# Patient Record
Sex: Female | Born: 1955 | Race: Black or African American | Hispanic: No | Marital: Single | Smoking: Current every day smoker
Health system: Southern US, Community
[De-identification: ages and names within clinical notes are randomized; demographics above are authoritative.]

## PROBLEM LIST (undated history)

## (undated) DIAGNOSIS — E119 Type 2 diabetes mellitus without complications: Secondary | ICD-10-CM

## (undated) DIAGNOSIS — Z8701 Personal history of pneumonia (recurrent): Secondary | ICD-10-CM

## (undated) DIAGNOSIS — R519 Headache, unspecified: Secondary | ICD-10-CM

## (undated) DIAGNOSIS — I10 Essential (primary) hypertension: Secondary | ICD-10-CM

## (undated) DIAGNOSIS — K219 Gastro-esophageal reflux disease without esophagitis: Secondary | ICD-10-CM

## (undated) DIAGNOSIS — I679 Cerebrovascular disease, unspecified: Secondary | ICD-10-CM

## (undated) DIAGNOSIS — M1990 Unspecified osteoarthritis, unspecified site: Secondary | ICD-10-CM

## (undated) DIAGNOSIS — I639 Cerebral infarction, unspecified: Secondary | ICD-10-CM

## (undated) DIAGNOSIS — F32A Depression, unspecified: Secondary | ICD-10-CM

## (undated) HISTORY — DX: Type 2 diabetes mellitus without complications: E11.9

## (undated) HISTORY — PX: OTHER SURGICAL HISTORY: SHX169

## (undated) HISTORY — DX: Cerebrovascular disease, unspecified: I67.9

## (undated) HISTORY — PX: SHOULDER ARTHROSCOPY: SHX128

## (undated) HISTORY — PX: KNEE ARTHROSCOPY: SUR90

## (undated) HISTORY — DX: Essential (primary) hypertension: I10

## (undated) HISTORY — PX: CATARACT EXTRACTION, BILATERAL: SHX1313

## (undated) HISTORY — PX: THYROIDECTOMY: SHX17

## (undated) HISTORY — PX: FRACTURE SURGERY: SHX138

---

## 2008-06-25 DIAGNOSIS — I639 Cerebral infarction, unspecified: Secondary | ICD-10-CM

## 2008-06-25 HISTORY — DX: Cerebral infarction, unspecified: I63.9

## 2018-12-29 ENCOUNTER — Ambulatory Visit (INDEPENDENT_AMBULATORY_CARE_PROVIDER_SITE_OTHER): Payer: Medicaid Other | Admitting: Family Medicine

## 2018-12-29 ENCOUNTER — Other Ambulatory Visit: Payer: Self-pay

## 2018-12-29 ENCOUNTER — Encounter: Payer: Self-pay | Admitting: Family Medicine

## 2018-12-29 VITALS — BP 124/70 | HR 81

## 2018-12-29 DIAGNOSIS — E1159 Type 2 diabetes mellitus with other circulatory complications: Secondary | ICD-10-CM

## 2018-12-29 DIAGNOSIS — I152 Hypertension secondary to endocrine disorders: Secondary | ICD-10-CM

## 2018-12-29 DIAGNOSIS — Z8673 Personal history of transient ischemic attack (TIA), and cerebral infarction without residual deficits: Secondary | ICD-10-CM

## 2018-12-29 DIAGNOSIS — R7303 Prediabetes: Secondary | ICD-10-CM

## 2018-12-29 DIAGNOSIS — I10 Essential (primary) hypertension: Secondary | ICD-10-CM | POA: Diagnosis not present

## 2018-12-29 DIAGNOSIS — Z9889 Other specified postprocedural states: Secondary | ICD-10-CM

## 2018-12-29 DIAGNOSIS — R232 Flushing: Secondary | ICD-10-CM

## 2018-12-29 DIAGNOSIS — R7309 Other abnormal glucose: Secondary | ICD-10-CM

## 2018-12-29 DIAGNOSIS — L608 Other nail disorders: Secondary | ICD-10-CM

## 2018-12-29 DIAGNOSIS — B351 Tinea unguium: Secondary | ICD-10-CM

## 2018-12-29 LAB — POCT GLYCOSYLATED HEMOGLOBIN (HGB A1C): HbA1c, POC (controlled diabetic range): 5.8 % (ref 0.0–7.0)

## 2018-12-29 NOTE — Patient Instructions (Signed)
It was great meeting you today!  We went over a number of issues.  I will get some lab work to check your cholesterol panel, kidney and liver function, and thyroid.  I will call you with these results will talk about them.  I made a referral to podiatry for foot care.

## 2018-12-30 LAB — LIPID PANEL
Chol/HDL Ratio: 3.3 ratio (ref 0.0–4.4)
Cholesterol, Total: 160 mg/dL (ref 100–199)
HDL: 49 mg/dL (ref >39)
Triglycerides: 484 mg/dL — ABNORMAL HIGH (ref 0–149)

## 2018-12-30 LAB — COMPREHENSIVE METABOLIC PANEL
ALT: 30 IU/L (ref 0–32)
AST: 18 IU/L (ref 0–40)
Albumin/Globulin Ratio: 1.7 (ref 1.2–2.2)
Albumin: 4.1 g/dL (ref 3.8–4.8)
Alkaline Phosphatase: 67 IU/L (ref 39–117)
BUN/Creatinine Ratio: 11 — ABNORMAL LOW (ref 12–28)
BUN: 10 mg/dL (ref 8–27)
Bilirubin Total: 0.4 mg/dL (ref 0.0–1.2)
CO2: 24 mmol/L (ref 20–29)
Calcium: 9.7 mg/dL (ref 8.7–10.3)
Chloride: 104 mmol/L (ref 96–106)
Creatinine, Ser: 0.89 mg/dL (ref 0.57–1.00)
GFR calc Af Amer: 81 mL/min/{1.73_m2} (ref >59)
GFR calc non Af Amer: 70 mL/min/{1.73_m2} (ref >59)
Globulin, Total: 2.4 g/dL (ref 1.5–4.5)
Glucose: 95 mg/dL (ref 65–99)
Potassium: 4.7 mmol/L (ref 3.5–5.2)
Sodium: 143 mmol/L (ref 134–144)
Total Protein: 6.5 g/dL (ref 6.0–8.5)

## 2018-12-30 LAB — TSH: TSH: 0.594 u[IU]/mL (ref 0.450–4.500)

## 2018-12-31 ENCOUNTER — Telehealth: Payer: Self-pay | Admitting: *Deleted

## 2018-12-31 NOTE — Telephone Encounter (Signed)
I called pt and asked if I could help. Pt states she was calling to see if we took new patients and accepted medicaid. I told pt we were accepting new patients and medicaid and I transferred to schedulers.

## 2018-12-31 NOTE — Telephone Encounter (Signed)
Pt called left only name and phone number.

## 2019-01-01 ENCOUNTER — Telehealth: Payer: Self-pay | Admitting: Family Medicine

## 2019-01-01 ENCOUNTER — Encounter: Payer: Self-pay | Admitting: Family Medicine

## 2019-01-01 ENCOUNTER — Telehealth: Payer: Self-pay

## 2019-01-01 DIAGNOSIS — Z8673 Personal history of transient ischemic attack (TIA), and cerebral infarction without residual deficits: Secondary | ICD-10-CM | POA: Insufficient documentation

## 2019-01-01 DIAGNOSIS — R7303 Prediabetes: Secondary | ICD-10-CM | POA: Insufficient documentation

## 2019-01-01 DIAGNOSIS — E1159 Type 2 diabetes mellitus with other circulatory complications: Secondary | ICD-10-CM | POA: Insufficient documentation

## 2019-01-01 DIAGNOSIS — I152 Hypertension secondary to endocrine disorders: Secondary | ICD-10-CM | POA: Insufficient documentation

## 2019-01-01 DIAGNOSIS — B351 Tinea unguium: Secondary | ICD-10-CM | POA: Insufficient documentation

## 2019-01-01 DIAGNOSIS — R232 Flushing: Secondary | ICD-10-CM | POA: Insufficient documentation

## 2019-01-01 MED ORDER — ATORVASTATIN CALCIUM 20 MG PO TABS: 20.0000 mg | ORAL_TABLET | Freq: Every day | ORAL | 0 refills | Status: DC

## 2019-01-01 MED ORDER — PROMETHAZINE HCL 12.5 MG PO TABS: 12.5000 mg | ORAL_TABLET | Freq: Three times a day (TID) | ORAL | 0 refills | Status: DC | PRN

## 2019-01-01 NOTE — Assessment & Plan Note (Signed)
Likely secondary to menopause given the onset and description of the symptoms.

## 2019-01-01 NOTE — Assessment & Plan Note (Signed)
BP 124/70.  Well-controlled on losartan 50 mg.

## 2019-01-01 NOTE — Assessment & Plan Note (Signed)
A1c 5.7.  Well-controlled metformin XR 500 mg daily.  We will continue with this dose, recheck A1c in 3 months.

## 2019-01-01 NOTE — Telephone Encounter (Signed)
Discussed lab results with patient. Will start atorvastatin 20mg  daily as patient is s/p stroke. Asked for me to refill phenergan, sent in to pharmacy.  Guadalupe Dawn MD PGY-2 Family Medicine Resident

## 2019-01-01 NOTE — Telephone Encounter (Signed)
Sounds great, I will keep an eye out for it  Monica Dawn MD PGY-2 Family Medicine Resident

## 2019-01-01 NOTE — Assessment & Plan Note (Signed)
Patient's risk factors appear well-controlled at this point.  Will get lipid panel to evaluate cholesterol.  Regardless of result will need to be on statin therapy, the only determination will be the dose.  Apparently had a GI bleed while on aspirin.  Unclear if she has any follow-up with neurology scheduled.

## 2019-01-01 NOTE — Telephone Encounter (Signed)
Please let the patient know that her labwork looked good. Her hemoglobin a1c was

## 2019-01-01 NOTE — Telephone Encounter (Signed)
Pt called back to let Dr. Kris Mouton know he will be receiving a packet from her previous Dr. Ottis Stain, CMA

## 2019-01-01 NOTE — Assessment & Plan Note (Signed)
Toenail overgrowth likely secondary to onychomycosis.  Patient request podiatry referral for management.  I think this is reasonable, referral placed.

## 2019-01-01 NOTE — Progress Notes (Signed)
  HPI:  Patient presents today for a new patient appointment to establish general primary care, also to discuss chronic stroke management, hot flashes.  Unfortunately do not have all of patient's records, these have been requested.  Patient had a stroke with mild residual deficits, most notable in needing a cane for left lower extremity weakness.  Patient states that she only takes losartan, metformin.  Does not take aspirin and does not take any anticholesterol medication.  She states that she was on aspirin but this was stopped as she had a GI bleed, thought to be due to hemorrhoids.  She had a colonoscopy 2 years ago.  A1c reviewed and was 5.6.  Patient states that she had part of her thyroid taken out for a thyroid issue.  She does not take any supplementation medications and did not think she is been checked recently.  Patient states that she has had hot flashes "all of a sudden".  His they will come on and she will feel hot and sweaty and these last for between 30 minutes to an hour.   ROS: See HPI  Past Medical Hx:  -Stroke -Unknown thyroid issue -Type 2 diabetes -Hypertension -Hemorrhoids  Past Surgical Hx:  -None  Family Hx: updated in Epic  Social Hx: Lives at home alone, retired, does not smoke, does not drink any alcohol, no illicit substances  Health Maintenance:  -Unknown, will attempt to get old PCP records  PHYSICAL EXAM: BP 124/70   Pulse 81   SpO2 95%  Gen: Well-appearing 63 year old African-American female, no acute stress, resting comfortably HEENT: EOMI, PERRLA. Heart: Regular rate rhythm, no M/R/G.  Palpable peripheral pulses, skin warm and dry Lungs: Lungs clear to auscultation bilaterally, no accessory muscle use Abdomen: Soft, nontender, nondistended Neuro: CN II through XII intact, very very mild gait abnormality, no focal neurologic deficit, 5 of 5 strength all muscle groups bilateral upper extremity bilateral lower extremity  ASSESSMENT/PLAN:  #  Health maintenance:  -We will attempt to get records from outside PCP  History of stroke Patient's risk factors appear well-controlled at this point.  Will get lipid panel to evaluate cholesterol.  Regardless of result will need to be on statin therapy, the only determination will be the dose.  Apparently had a GI bleed while on aspirin.  Unclear if she has any follow-up with neurology scheduled.  Hypertension associated with diabetes ([place]) BP 124/70.  Well-controlled on losartan 50 mg.  Hot flashes Likely secondary to menopause given the onset and description of the symptoms.  Pre-diabetes A1c 5.7.  Well-controlled metformin XR 500 mg daily.  We will continue with this dose, recheck A1c in 3 months.  Onychomycosis Toenail overgrowth likely secondary to onychomycosis.  Patient request podiatry referral for management.  I think this is reasonable, referral placed.     FOLLOW UP: Follow-up in 3 months for A1c check  Guadalupe Dawn MD PGY-3 Family Medicine Resident

## 2019-01-09 ENCOUNTER — Telehealth: Payer: Self-pay | Admitting: *Deleted

## 2019-01-09 ENCOUNTER — Encounter: Payer: Self-pay | Admitting: Podiatry

## 2019-01-09 ENCOUNTER — Ambulatory Visit: Payer: Medicaid Other | Admitting: Podiatry

## 2019-01-09 ENCOUNTER — Other Ambulatory Visit: Payer: Self-pay

## 2019-01-09 VITALS — BP 134/92 | HR 89 | Temp 97.3°F

## 2019-01-09 DIAGNOSIS — M79675 Pain in left toe(s): Secondary | ICD-10-CM

## 2019-01-09 DIAGNOSIS — M79674 Pain in right toe(s): Secondary | ICD-10-CM | POA: Diagnosis not present

## 2019-01-09 DIAGNOSIS — B351 Tinea unguium: Secondary | ICD-10-CM

## 2019-01-09 DIAGNOSIS — M2011 Hallux valgus (acquired), right foot: Secondary | ICD-10-CM

## 2019-01-09 DIAGNOSIS — M2012 Hallux valgus (acquired), left foot: Secondary | ICD-10-CM

## 2019-01-09 MED ORDER — CICLOPIROX 8 % EX SOLN: Freq: Every day | CUTANEOUS | 11 refills | Status: DC

## 2019-01-09 NOTE — Patient Instructions (Signed)
Diabetes Mellitus and Foot Care Foot care is an important part of your health, especially when you have diabetes. Diabetes may cause you to have problems because of poor blood flow (circulation) to your feet and legs, which can cause your skin to:  Become thinner and drier.  Break more easily.  Heal more slowly.  Peel and crack. You may also have nerve damage (neuropathy) in your legs and feet, causing decreased feeling in them. This means that you may not notice minor injuries to your feet that could lead to more serious problems. Noticing and addressing any potential problems early is the best way to prevent future foot problems. How to care for your feet Foot hygiene  Wash your feet daily with warm water and mild soap. Do not use hot water. Then, pat your feet and the areas between your toes until they are completely dry. Do not soak your feet as this can dry your skin.  Trim your toenails straight across. Do not dig under them or around the cuticle. File the edges of your nails with an emery board or nail file.  Apply a moisturizing lotion or petroleum jelly to the skin on your feet and to dry, brittle toenails. Use lotion that does not contain alcohol and is unscented. Do not apply lotion between your toes. Shoes and socks  Wear clean socks or stockings every day. Make sure they are not too tight. Do not wear knee-high stockings since they may decrease blood flow to your legs.  Wear shoes that fit properly and have enough cushioning. Always look in your shoes before you put them on to be sure there are no objects inside.  To break in new shoes, wear them for just a few hours a day. This prevents injuries on your feet. Wounds, scrapes, corns, and calluses  Check your feet daily for blisters, cuts, bruises, sores, and redness. If you cannot see the bottom of your feet, use a mirror or ask someone for help.  Do not cut corns or calluses or try to remove them with medicine.  If you  find a minor scrape, cut, or break in the skin on your feet, keep it and the skin around it clean and dry. You may clean these areas with mild soap and water. Do not clean the area with peroxide, alcohol, or iodine.  If you have a wound, scrape, corn, or callus on your foot, look at it several times a day to make sure it is healing and not infected. Check for: ? Redness, swelling, or pain. ? Fluid or blood. ? Warmth. ? Pus or a bad smell. General instructions  Do not cross your legs. This may decrease blood flow to your feet.  Do not use heating pads or hot water bottles on your feet. They may burn your skin. If you have lost feeling in your feet or legs, you may not know this is happening until it is too late.  Protect your feet from hot and cold by wearing shoes, such as at the beach or on hot pavement.  Schedule a complete foot exam at least once a year (annually) or more often if you have foot problems. If you have foot problems, report any cuts, sores, or bruises to your health care provider immediately. Contact a health care provider if:  You have a medical condition that increases your risk of infection and you have any cuts, sores, or bruises on your feet.  You have an injury that is not   healing.  You have redness on your legs or feet.  You feel burning or tingling in your legs or feet.  You have pain or cramps in your legs and feet.  Your legs or feet are numb.  Your feet always feel cold.  You have pain around a toenail. Get help right away if:  You have a wound, scrape, corn, or callus on your foot and: ? You have pain, swelling, or redness that gets worse. ? You have fluid or blood coming from the wound, scrape, corn, or callus. ? Your wound, scrape, corn, or callus feels warm to the touch. ? You have pus or a bad smell coming from the wound, scrape, corn, or callus. ? You have a fever. ? You have a red line going up your leg. Summary  Check your feet every day  for cuts, sores, red spots, swelling, and blisters.  Moisturize feet and legs daily.  Wear shoes that fit properly and have enough cushioning.  If you have foot problems, report any cuts, sores, or bruises to your health care provider immediately.  Schedule a complete foot exam at least once a year (annually) or more often if you have foot problems. This information is not intended to replace advice given to you by your health care provider. Make sure you discuss any questions you have with your health care provider. Document Released: 06/08/2000 Document Revised: 07/24/2017 Document Reviewed: 07/13/2016 Elsevier Patient Education  2020 Elsevier Inc.   Onychomycosis/Fungal Toenails  WHAT IS IT? An infection that lies within the keratin of your nail plate that is caused by a fungus.  WHY ME? Fungal infections affect all ages, sexes, races, and creeds.  There may be many factors that predispose you to a fungal infection such as age, coexisting medical conditions such as diabetes, or an autoimmune disease; stress, medications, fatigue, genetics, etc.  Bottom line: fungus thrives in a warm, moist environment and your shoes offer such a location.  IS IT CONTAGIOUS? Theoretically, yes.  You do not want to share shoes, nail clippers or files with someone who has fungal toenails.  Walking around barefoot in the same room or sleeping in the same bed is unlikely to transfer the organism.  It is important to realize, however, that fungus can spread easily from one nail to the next on the same foot.  HOW DO WE TREAT THIS?  There are several ways to treat this condition.  Treatment may depend on many factors such as age, medications, pregnancy, liver and kidney conditions, etc.  It is best to ask your doctor which options are available to you.  1. No treatment.   Unlike many other medical concerns, you can live with this condition.  However for many people this can be a painful condition and may lead to  ingrown toenails or a bacterial infection.  It is recommended that you keep the nails cut short to help reduce the amount of fungal nail. 2. Topical treatment.  These range from herbal remedies to prescription strength nail lacquers.  About 40-50% effective, topicals require twice daily application for approximately 9 to 12 months or until an entirely new nail has grown out.  The most effective topicals are medical grade medications available through physicians offices. 3. Oral antifungal medications.  With an 80-90% cure rate, the most common oral medication requires 3 to 4 months of therapy and stays in your system for a year as the new nail grows out.  Oral antifungal medications do require   blood work to make sure it is a safe drug for you.  A liver function panel will be performed prior to starting the medication and after the first month of treatment.  It is important to have the blood work performed to avoid any harmful side effects.  In general, this medication safe but blood work is required. 4. Laser Therapy.  This treatment is performed by applying a specialized laser to the affected nail plate.  This therapy is noninvasive, fast, and non-painful.  It is not covered by insurance and is therefore, out of pocket.  The results have been very good with a 80-95% cure rate.  [facility] is the only practice in the area to offer this therapy. 5. Permanent Nail Avulsion.  Removing the entire nail so that a new nail will not grow back. 

## 2019-01-09 NOTE — Telephone Encounter (Signed)
Pt called to see what time her appt is today. I informed pt the appt is at 1:45pm.

## 2019-01-14 ENCOUNTER — Encounter: Payer: Self-pay | Admitting: Podiatry

## 2019-01-14 NOTE — Progress Notes (Signed)
Subjective: Monica Watson presents today referred by Guadalupe Dawn, MD with cc of painful, discolored, thick toenails which interfere with daily activities.  Pain is aggravated when wearing enclosed shoe gear.   Past Medical History:  Diagnosis Date  . Cerebrovascular disease   . Hypertension   . Type 2 diabetes mellitus [facility])      Patient Active Problem List   Diagnosis Date Noted  . History of stroke 01/01/2019  . Hypertension associated with diabetes ([place]) 01/01/2019  . Pre-diabetes 01/01/2019  . Hot flashes 01/01/2019  . Onychomycosis 01/01/2019     Past Surgical History:  Procedure Laterality Date  . THYROIDECTOMY      Medications reviewed.  No Known Allergies   Social History   Occupational History  . Not on file  Tobacco Use  . Smoking status: Former Research scientist (life sciences)  . Smokeless tobacco: Never Used  Substance and Sexual Activity  . Alcohol use: Never    Frequency: Never  . Drug use: Never  . Sexual activity: Not on file    History reviewed. No pertinent family history.    There is no immunization history on file for this patient.   Review of systems: Positive Findings in bold print.  Constitutional:  chills, fatigue, fever, sweats, weight change Communication: Optometrist, sign Ecologist, hand writing, iPad/Android device Head: headaches, head injury Eyes: changes in vision, eye pain, glaucoma, cataracts, macular degeneration, diplopia, glare,  light sensitivity, eyeglasses or contacts, blindness Ears nose mouth throat: hearing impaired, hearing aids,  ringing in ears, deaf, sign language,  vertigo,   nosebleeds,  rhinitis,  cold sores, snoring, swollen glands Cardiovascular: HTN, edema, arrhythmia, pacemaker in place, defibrillator in place, chest pain/tightness, chronic anticoagulation, blood clot, heart failure, MI Peripheral Vascular: leg cramps, varicose veins, blood clots, lymphedema, varicosities Respiratory:  difficulty breathing, denies  congestion, SOB, wheezing, cough, emphysema Gastrointestinal: change in appetite or weight, abdominal pain, constipation, diarrhea, nausea, vomiting, vomiting blood, change in bowel habits, abdominal pain, jaundice, rectal bleeding, hemorrhoids, GERD Genitourinary:  nocturia,  pain on urination, polyuria,  blood in urine, Foley catheter, urinary urgency, ESRD on hemodialysis Musculoskeletal: amputation, cramping, stiff joints, painful joints, decreased joint motion, fractures, OA, gout, hemiplegia, paraplegia, uses cane, wheelchair bound, uses walker, uses rollator Skin: +changes in toenails, color change, dryness, itching, mole changes,  rash, wound(s) Neurological: headaches, numbness in feet, paresthesias in feet, burning in feet, fainting,  seizures, change in speech. denies headaches, memory problems/poor historian, cerebral palsy, weakness, paralysis, CVA, TIA Endocrine: pre-diabetes, diabetes, hypothyroidism, hyperthyroidism,  goiter, dry mouth, flushing, heat intolerance,  cold intolerance,  excessive thirst, denies polyuria,  nocturia Hematological:  easy bleeding, excessive bleeding, easy bruising, enlarged lymph nodes, on long term blood thinner, history of past transusions Allergy/immunological:  hives, eczema, frequent infections, multiple drug allergies, seasonal allergies, transplant recipient, multiple food allergies Psychiatric:  anxiety, depression, mood disorder, suicidal ideations, hallucinations, insomnia  Objective: Vitals:   01/09/19 1352  BP: (!) 134/92  Pulse: 89  Temp: (!) 97.3 F (36.3 C)    Vascular Examination: Capillary refill time immediate x 10 digits.  Dorsalis pedis pulses palpable b/l.   Posterior tibial pulses palpable b/l.   No digital hair x 10 digits.  Skin temperature gradient WNL b/l.  Dermatological Examination: Skin with normal turgor, texture and tone b/l.  Toenails 1-5 b/l discolored, thick, dystrophic with subungual debris and pain with  palpation to nailbeds due to thickness of nails.  Musculoskeletal: Muscle strength 5/5 RLE and 4/5 to LLE.  HAV with bunion  b/l.  Hammertoe 2nd digit b/l.  Neurological: Sensation intact with 10 gram monofilament.  Vibratory sensation intact.  Assessment: 1. Painful onychomycosis toenails 1-5 b/l  2. Pain in toes of left foot and right foot 3. HAV with bunion b/l 4. Hammertoe 2nd b/l  Plan: 1. Discussed onychomycosis and treatment options.  Literature dispensed on today. 2. Toenails 1-5 b/l were debrided in length and girth without iatrogenic bleeding.Rx written for Penlac Nail Lacquer 8% to be applied to affected toenails once daily for 48 weeks and removed once weekly with nail polis remover. 3. Patient to continue soft, supportive shoe gear daily. 4. Patient to report any pedal injuries to medical professional immediately. 5. Follow up 3 months.  6. Patient/POA to call should there be a concern in the interim.

## 2019-01-16 ENCOUNTER — Telehealth: Payer: Self-pay | Admitting: *Deleted

## 2019-01-16 NOTE — Telephone Encounter (Signed)
Pt wants to know if MD received the paperwork from the housing authority last week.  There is nothing in providers box, will forward to him to ask.  Christen Bame, CMA

## 2019-01-19 NOTE — Telephone Encounter (Signed)
I dont believe I ever received anything like that for this patient. I dont think we even discussed during her appointment or subsequent telephone call. She can ask the housing authority to resend.  Guadalupe Dawn MD PGY-3 Family Medicine Resident

## 2019-01-20 NOTE — Telephone Encounter (Signed)
Pt informed.  Catlin Doria, CMA  

## 2019-01-28 ENCOUNTER — Telehealth: Payer: Self-pay | Admitting: *Deleted

## 2019-01-28 NOTE — Telephone Encounter (Signed)
Pt calls for 2 reasons:  1. She would like to have a PCA because she had one for 3 -4 hours at her last residence   ( this form would need to be completed and submitted to [facility] [place] - I can help with that)  2.She would like a referral to an eye MD. Christen Bame, CMA

## 2019-01-30 ENCOUNTER — Other Ambulatory Visit: Payer: Self-pay | Admitting: *Deleted

## 2019-01-30 MED ORDER — LOSARTAN POTASSIUM 50 MG PO TABS: ORAL_TABLET | ORAL | 0 refills | Status: DC

## 2019-02-25 ENCOUNTER — Ambulatory Visit: Payer: Medicaid Other | Admitting: Family Medicine

## 2019-03-04 ENCOUNTER — Ambulatory Visit: Payer: Medicaid Other | Admitting: Family Medicine

## 2019-03-04 ENCOUNTER — Ambulatory Visit (INDEPENDENT_AMBULATORY_CARE_PROVIDER_SITE_OTHER): Payer: Medicaid Other | Admitting: Family Medicine

## 2019-03-04 ENCOUNTER — Other Ambulatory Visit: Payer: Self-pay

## 2019-03-04 VITALS — BP 130/80 | HR 79 | Wt 203.4 lb

## 2019-03-04 DIAGNOSIS — R269 Unspecified abnormalities of gait and mobility: Secondary | ICD-10-CM | POA: Diagnosis present

## 2019-03-04 DIAGNOSIS — R0981 Nasal congestion: Secondary | ICD-10-CM

## 2019-03-04 DIAGNOSIS — Z8673 Personal history of transient ischemic attack (TIA), and cerebral infarction without residual deficits: Secondary | ICD-10-CM

## 2019-03-04 NOTE — Patient Instructions (Signed)
It was great seeing you again today!  I am sorry been having the ear fullness.  That is likely inner ear pressure caused by your sinuses.  You can take Afrin over-the-counter nasal spray for a few days to see if this clears it up.  Please let me know if it does not help.  You can just call Medicaid and asked them which optometrist and dentist take your insurance in the area.  I will place referral for home health.

## 2019-03-09 NOTE — Telephone Encounter (Signed)
Discussed with patient at most recent visit  Monica Dawn MD PGY-3 Family Medicine Resident

## 2019-03-11 ENCOUNTER — Encounter: Payer: Self-pay | Admitting: Family Medicine

## 2019-03-11 DIAGNOSIS — R0981 Nasal congestion: Secondary | ICD-10-CM | POA: Insufficient documentation

## 2019-03-11 NOTE — Assessment & Plan Note (Signed)
Patient with symptoms consistent with sinus congestion.  Explained she can take Afrin over-the-counter for a few days to see if this helps.  This does not help she can let me know happy to prescribe her Flonase.

## 2019-03-11 NOTE — Progress Notes (Signed)
   HPI 63 year old female who presents for pain in her ears.  She states this pain is been going on for about a week now.  She describes it as a "pressure".  She makes a motion her jaw extension says this relieves her pain.  She has not tried anything for it.  She is also had accompanying rhinorrhea and some sinus pressure.  She is requesting a referral to an optometrist and a dentist.  Explained to patient that is not in referral for either these she can contact her insurance company to figure out who takes her insurance and the area.  CC: sinus congestion   ROS:   Review of Systems See HPI for ROS.   CC, SH/smoking status, and VS noted  Objective: BP 130/80   Pulse 79   Wt 203 lb 6.4 oz (92.3 kg)   SpO2 26%  Gen: 63 year old, very pleasant, African-American female HEENT: No bulging tympanic membrane, no erythematous ear canal.  Very mild sinus tenderness CV: Regular rate rhythm, no M/R/G Resp: Lungs clear to auscultation bilaterally, no accessory muscle use Neuro: Alert oriented x3, speech clear, mild right upper extremity right lower extremity weakness is compared to left.  Accompnaying gait abnormality secondary to weakness.  Assessment and plan:  Sinus congestion Patient with symptoms consistent with sinus congestion.  Explained she can take Afrin over-the-counter for a few days to see if this helps.  This does not help she can let me know happy to prescribe her Flonase.   Orders Placed This Encounter  Procedures  . Ambulatory referral to Home Health    Referral Priority:   Routine    Referral Type:   Home Health Care    Referral Reason:   Specialty Services Required    Requested Specialty:   [place]    Number of Visits Requested:   1    No orders of the defined types were placed in this encounter.    Guadalupe Dawn MD PGY-3 Family Medicine Resident  03/11/2019 7:41 AM

## 2019-03-11 NOTE — Assessment & Plan Note (Signed)
patient requesting home health aide to help her with her ADLs secondary to stroke.

## 2019-03-23 ENCOUNTER — Ambulatory Visit: Payer: Medicaid Other | Admitting: Family Medicine

## 2019-03-26 ENCOUNTER — Ambulatory Visit: Payer: Medicaid Other | Admitting: Family Medicine

## 2019-04-02 ENCOUNTER — Ambulatory Visit: Payer: Medicaid Other | Admitting: Family Medicine

## 2019-04-03 ENCOUNTER — Other Ambulatory Visit: Payer: Self-pay

## 2019-04-03 ENCOUNTER — Ambulatory Visit (INDEPENDENT_AMBULATORY_CARE_PROVIDER_SITE_OTHER): Payer: Medicaid Other | Admitting: Family Medicine

## 2019-04-03 VITALS — BP 110/65 | HR 73 | Temp 98.4°F | Wt 199.0 lb

## 2019-04-03 DIAGNOSIS — K625 Hemorrhage of anus and rectum: Secondary | ICD-10-CM

## 2019-04-03 DIAGNOSIS — K648 Other hemorrhoids: Secondary | ICD-10-CM | POA: Diagnosis not present

## 2019-04-03 MED ORDER — IRON (FERROUS SULFATE) 325 (65 FE) MG PO TABS: 325.0000 mg | ORAL_TABLET | Freq: Every day | ORAL | 3 refills | Status: DC

## 2019-04-03 NOTE — Progress Notes (Signed)
   [facility] Phone: [phone]     Monica Watson - 63 y.o. female MRN [id number]  Date of birth: Oct 30, 1955  Subjective:   cc: bloody stools  HPI:  Bloody stools: started two years ago.  A doctor in [place] 'fixed it' with a balloon. This was about 1.5 years ago.  A couple after it was 'fixed' she had rectal bleeding again. Only sees blood when she wipes, does not see blood in the toilet bowl. No black, tarry stools. Now happens once or twice a week.  Not more frequent now than before.  Only has occasional rectal pain when sitting on the toilet.  She uses preparation H.  She received a colonoscopy a few years ago, which she says did not show cancer.Dr. Baltazar Apo in [place] was her previous pcp and should have records of her colonoscopy.   Only feels fatigue 'sometimes'. The 12 stairs in her apartment tire her out.  Feels dizziness 'sometimes' when she stands up.    ROS: See HPI for pertinent positives and negatives  Past Medical History significant for hemorrhoids.   Family history reviewed for today's visit. No changes.  Social history- patient is a smoker - 1-2 cigarettes a day. Smoking since 63 years old.     Objective:   BP 110/65   Pulse 73   Temp 98.4 F (36.9 C) (Oral)   Wt 199 lb (90.3 kg)   SpO2 97%  Gen: NAD, alert and oriented, cooperative with exam HEENT: No subconjunctival pallor. Moist oral mucosa.  CV: normal rate, regular rhythm. No murmurs, no rubs.  Resp: LCTAB, no wheezes, crackles. normal work of breathing GI: nontender to palpation, BS present, no guarding or organomegaly. External hemorrhoid seen on exam. Could not appreciate internal hemorrhoids on digital rectal exam.    Assessment/Plan:   Hemorrhoids Hx of hemorrhoids that may have been treated with ligation previously. Reportedly has normal colonoscopy from two years ago.  Notes occasional blood in stool.  On exam external hemorrhoid was visualized, could not  appreciate internal hemorrhoids.  - obtain records from previous pcp regarding colonoscopy and treatment.   - amb ref to GI for surgical treatment if necessary - cbc - daily iron supp   Clemetine Marker, MD PGY-2 [facility]

## 2019-04-03 NOTE — Patient Instructions (Signed)
I will get some lab work to see if you are suffering from chronic anemia due to blood loss.  I will let you know the results after I get them.  I will also try to get the records from your previous doctor.  I will send you for a referral to the gastroenterologist for possible surgical fixation of internal hemorrhoids if necessary.  Please take these iron supplements to help reduce the risk of anemia.  Clemetine Marker, MD

## 2019-04-04 DIAGNOSIS — K649 Unspecified hemorrhoids: Secondary | ICD-10-CM | POA: Insufficient documentation

## 2019-04-04 LAB — CBC
Hematocrit: 40.5 % (ref 34.0–46.6)
Hemoglobin: 14.1 g/dL (ref 11.1–15.9)
MCH: 32 pg (ref 26.6–33.0)
MCHC: 34.8 g/dL (ref 31.5–35.7)
MCV: 92 fL (ref 79–97)
Platelets: 270 10*3/uL (ref 150–450)
RBC: 4.4 x10E6/uL (ref 3.77–5.28)
RDW: 12 % (ref 11.7–15.4)
WBC: 3.9 10*3/uL (ref 3.4–10.8)

## 2019-04-04 NOTE — Assessment & Plan Note (Signed)
Hx of hemorrhoids that may have been treated with ligation previously. Reportedly has normal colonoscopy from two years ago.  Notes occasional blood in stool.  On exam external hemorrhoid was visualized, could not appreciate internal hemorrhoids.  - obtain records from previous pcp regarding colonoscopy and treatment.   - amb ref to GI for surgical treatment if necessary - cbc - daily iron supp

## 2019-04-07 ENCOUNTER — Telehealth: Payer: Self-pay | Admitting: Family Medicine

## 2019-04-07 NOTE — Telephone Encounter (Signed)
Called and LVM for pt informing her cbc was normal, not indicating anemia, but she would still benefit from taking iron in case bleeding does lead to anemia in the future.

## 2019-04-13 ENCOUNTER — Ambulatory Visit: Payer: Medicaid Other | Admitting: Podiatry

## 2019-04-17 ENCOUNTER — Other Ambulatory Visit: Payer: Self-pay

## 2019-04-17 ENCOUNTER — Ambulatory Visit (INDEPENDENT_AMBULATORY_CARE_PROVIDER_SITE_OTHER): Payer: Medicaid Other | Admitting: Family Medicine

## 2019-04-17 ENCOUNTER — Encounter: Payer: Self-pay | Admitting: Family Medicine

## 2019-04-17 DIAGNOSIS — H6123 Impacted cerumen, bilateral: Secondary | ICD-10-CM

## 2019-04-17 DIAGNOSIS — K648 Other hemorrhoids: Secondary | ICD-10-CM | POA: Diagnosis not present

## 2019-04-17 NOTE — Patient Instructions (Signed)
It was great seeing you again today!  I do think that your pain with defecation is due to your hemorrhoids.  Ultimate treatment will be done by the gastroenterologist.  The referral was placed a couple weeks ago so should be hearing from them soon.  In the meantime he can continue taking your Preparation H and do a sitz bath.  I gave you a handout for this.  Your ear pain and balance issues are likely due to impacted earwax that you have.  We are going to try some over-the-counter drops to see if this improves these issues.  Please let me know if they do not improve.

## 2019-04-22 ENCOUNTER — Encounter: Payer: Self-pay | Admitting: Family Medicine

## 2019-04-22 DIAGNOSIS — H6120 Impacted cerumen, unspecified ear: Secondary | ICD-10-CM | POA: Insufficient documentation

## 2019-04-22 NOTE — Assessment & Plan Note (Signed)
Recommended using sitz bath on top of her Preparation H.  Keep appoint with GI.  CBC from 10/9 without abnormality.

## 2019-04-22 NOTE — Progress Notes (Signed)
   HPI 63 year old female who presents for rectal pain.  She was seen on 04/03/2019 for this issue.  She was found to have external hemorrhoids on exam.  No internal hemorrhoids were seen.  Patient has been using Preparation H which has been helping some.  Patient has not heard from gastroenterology referral which was placed at last visit.  Patient complains of some mild ear discomfort.  States is been going on for a couple of weeks.  Is more of an itching sensation than anything.   CC: Rectal pain  ROS:   Review of Systems See HPI for ROS.   CC, SH/smoking status, and VS noted  Objective: BP 104/72   Pulse 74   Wt 200 lb 3.2 oz (90.8 kg)   SpO2 22%  Gen: 64 year old female, no acute distress, resting comfortably HEENT: Mild cerumen impaction bilaterally CV: No M/R/G, regular rate and rhythm Resp: Lungs clear to auscultation bilaterally, no accessory muscle use Abd: Soft, nontender, nondistended.  Rectal exam deferred due to patient preference. Neuro: Alert and oriented, Speech clear, No gross deficits  Assessment and plan:  Hemorrhoids Recommended using sitz bath on top of her Preparation H.  Keep appoint with GI.  CBC from 10/9 without abnormality.  Cerumen impaction Mild cerumen impaction bilaterally.  Will do Debrox drops.  Follow-up as needed.   No orders of the defined types were placed in this encounter.   No orders of the defined types were placed in this encounter.   Guadalupe Dawn MD PGY-3 Family Medicine Resident  04/22/2019 12:38 PM

## 2019-04-22 NOTE — Assessment & Plan Note (Signed)
Mild cerumen impaction bilaterally.  Will do Debrox drops.  Follow-up as needed.

## 2019-05-04 ENCOUNTER — Ambulatory Visit: Payer: Medicaid Other | Admitting: Podiatry

## 2019-05-04 ENCOUNTER — Encounter: Payer: Self-pay | Admitting: Podiatry

## 2019-05-04 ENCOUNTER — Other Ambulatory Visit: Payer: Self-pay

## 2019-05-04 DIAGNOSIS — M205X2 Other deformities of toe(s) (acquired), left foot: Secondary | ICD-10-CM | POA: Diagnosis not present

## 2019-05-04 DIAGNOSIS — M79672 Pain in left foot: Secondary | ICD-10-CM

## 2019-05-04 DIAGNOSIS — M7752 Other enthesopathy of left foot: Secondary | ICD-10-CM

## 2019-05-04 DIAGNOSIS — M779 Enthesopathy, unspecified: Secondary | ICD-10-CM

## 2019-05-07 NOTE — Progress Notes (Signed)
Subjective:   Patient ID: Monica Watson, female   DOB: 63 y.o.   MRN: [id number]   HPI Patient presents concerned about inflammation around the big toe joint left also restricted motion and generalized foot pain left   ROS      Objective:  Physical Exam  Neurovascular status unchanged intact with mild restriction of motion first MPJ left with crepitus upon dorsiflexion with inflammation fluid buildup around the first MPJ and mild discomfort extending into the inner phalangeal joint.  There is forefoot pain also noted of a low-grade but not as intense as the first MPJ     Assessment:  Probability for inflammatory capsulitis first MPJ left with moderate hallux limitus deformity and dorsal tendinitis     Plan:  H&P reviewed condition and discussed tendinitis and also discussed hallux limitus and the fact it may need to be addressed at one point future.  Today I did sterile prep and injected the first MPJ 3 mg Kenalog 5 mg Xylocaine periarticular to reduce the inflammation and we will see the response to this and decide if there is a more aggressive treatment that will be necessary  X-rays indicate minimal signs of structural changes around the first MPJ with functional hallux limitus noted and mild reduction of joint space

## 2019-05-27 ENCOUNTER — Other Ambulatory Visit: Payer: Self-pay

## 2019-05-27 ENCOUNTER — Encounter: Payer: Self-pay | Admitting: Podiatry

## 2019-05-27 ENCOUNTER — Ambulatory Visit: Payer: Medicaid Other | Admitting: Podiatry

## 2019-05-27 DIAGNOSIS — M7752 Other enthesopathy of left foot: Secondary | ICD-10-CM | POA: Diagnosis not present

## 2019-05-27 DIAGNOSIS — M205X2 Other deformities of toe(s) (acquired), left foot: Secondary | ICD-10-CM

## 2019-05-27 DIAGNOSIS — M779 Enthesopathy, unspecified: Secondary | ICD-10-CM

## 2019-05-27 DIAGNOSIS — M2011 Hallux valgus (acquired), right foot: Secondary | ICD-10-CM

## 2019-05-27 DIAGNOSIS — M2012 Hallux valgus (acquired), left foot: Secondary | ICD-10-CM | POA: Diagnosis not present

## 2019-05-27 NOTE — Progress Notes (Signed)
Subjective:   Patient ID: Monica Watson, female   DOB: 63 y.o.   MRN: [id number]   HPI Patient presents stating I am still getting pain around my big toe joint left and I know I do have a bunion on this foot   ROS      Objective:  Physical Exam  Neurovascular status intact with continued discomfort of several months duration around the first MPJ left with pain that covers the entire dorsal surface with moderate prominence of the first metatarsal head itself     Assessment:  Possibility for a still low-grade capsulitis of the first MPJ or functional hallux limitus structural HAV deformity     Plan:  H&P reviewed condition and did discuss bunion versus inflamed capsule and I did a sterile prep and injected the lateral and dorsal capsule 3 mg Dexasone Kenalog 5 mg Xylocaine and then went ahead and discussed possible bunion correction in future if symptoms do not get better.  I have referring this patient to Dr. Posey Pronto for evaluation and consideration of surgery if symptoms persist

## 2019-05-28 ENCOUNTER — Other Ambulatory Visit: Payer: Self-pay

## 2019-05-28 MED ORDER — PROMETHAZINE HCL 12.5 MG PO TABS: 12.5000 mg | ORAL_TABLET | Freq: Three times a day (TID) | ORAL | 0 refills | Status: DC | PRN

## 2019-06-11 ENCOUNTER — Encounter: Payer: Self-pay | Admitting: Family Medicine

## 2019-07-02 ENCOUNTER — Other Ambulatory Visit: Payer: Self-pay | Admitting: Family Medicine

## 2019-07-08 ENCOUNTER — Ambulatory Visit: Payer: Medicaid Other | Admitting: Podiatry

## 2019-07-10 ENCOUNTER — Other Ambulatory Visit: Payer: Self-pay

## 2019-07-10 ENCOUNTER — Ambulatory Visit (INDEPENDENT_AMBULATORY_CARE_PROVIDER_SITE_OTHER): Payer: Medicaid Other | Admitting: Family Medicine

## 2019-07-10 DIAGNOSIS — H6121 Impacted cerumen, right ear: Secondary | ICD-10-CM

## 2019-07-10 NOTE — Patient Instructions (Signed)
Great seeing you again today!  The hydroperoxide in your ear has made your ear exam a little bit difficult.  I believe the mucousy earwax mixture is likely due to this.  I would recommend using Debrox drops to help wash out that wax.  If this feeling of fullness in your inner ear and the discharge does not stop, would like to see you back for further evaluation.  I gave you a handout for the Debrox drops.  Unfortunately I believe your hot flashes, trouble sleeping, and mood swings are likely due to menopause.  If the symptoms get worse there are some options to help decrease the symptoms, but unfortunately this is part of life.

## 2019-07-15 ENCOUNTER — Encounter: Payer: Self-pay | Admitting: Family Medicine

## 2019-07-15 NOTE — Assessment & Plan Note (Signed)
Ear discomfort and subsequent fluid leakage likely 2/2 dissolving of present cerumen impaction. Recommended discontinuing hydrogen peroxide. Can try debrox drops to prevent cerumen accumulation. Can follow up as needed if "leaking" returns or discomfort returns.

## 2019-07-15 NOTE — Progress Notes (Signed)
   HPI 64 year old female who presents for right ear discomfort. She states that the pain has resolved. She states that this has been going on for 3-4 days. She placed some hydrogen peroxide in her ear roughly 24 hours prior to office visit. Since that time her ear has been "leaking" wax colored fluid. No purulent material, no bleeding, no hearing difficulty.  CC: right ear discomfort   ROS:  Review of Systems See HPI for ROS.   CC, SH/smoking status, and VS noted  Objective: BP 115/80   Pulse 80   Wt 199 lb 3.2 oz (90.4 kg)   SpO2 65%  Gen: 64 year old AA female, no acute distress HEENT: slight TM whitish discoloration, low-viscosity wax-colored substance noted within ear. Able to easily scrap away from external ear with ear loop. CV: skin warm and dry Resp: no accessory muscle use Neuro: Alert and oriented, Speech clear, No gross deficits   Assessment and plan:  Cerumen impaction Ear discomfort and subsequent fluid leakage likely 2/2 dissolving of present cerumen impaction. Recommended discontinuing hydrogen peroxide. Can try debrox drops to prevent cerumen accumulation. Can follow up as needed if "leaking" returns or discomfort returns.   No orders of the defined types were placed in this encounter.   No orders of the defined types were placed in this encounter.    Guadalupe Dawn MD PGY-3 Family Medicine Resident  07/15/2019 11:19 PM

## 2019-07-20 ENCOUNTER — Ambulatory Visit: Payer: Medicaid Other | Admitting: Podiatry

## 2019-07-27 ENCOUNTER — Ambulatory Visit: Payer: Medicaid Other | Admitting: Podiatry

## 2019-07-31 ENCOUNTER — Ambulatory Visit: Payer: Medicaid Other | Admitting: Podiatry

## 2019-07-31 ENCOUNTER — Other Ambulatory Visit: Payer: Self-pay

## 2019-07-31 DIAGNOSIS — Q828 Other specified congenital malformations of skin: Secondary | ICD-10-CM | POA: Diagnosis not present

## 2019-07-31 DIAGNOSIS — M79671 Pain in right foot: Secondary | ICD-10-CM | POA: Diagnosis not present

## 2019-07-31 DIAGNOSIS — M7752 Other enthesopathy of left foot: Secondary | ICD-10-CM | POA: Diagnosis not present

## 2019-08-04 ENCOUNTER — Encounter: Payer: Self-pay | Admitting: Podiatry

## 2019-08-04 NOTE — Progress Notes (Signed)
Subjective:  Patient ID: Monica Watson, female    DOB: Oct 25, 1955,  MRN: [id number]  Chief Complaint  Patient presents with  . Foot Pain    pt is here for 6 week left foot pain, pt states that the left foot is doing a lot better since the last time she was here, injection she recieved wore off, and is looking to get another one, pain is a 6 out of 47    64 y.o. female presents with the above complaint.  Patient presents with a 6-week follow-up of the left first metatarsophalangeal joint capsulitis.  Patient is well-known to Dr. Paulla Dolly who will perform a steroid injection in the joint.  Patient states that she is feeling no further pain within the joint.  She also has secondary complaint of right lateral benign lesions that have been causing her a lot of pain.  The pain is worsened when ambulating with pressure.  She denies any other acute complaints.   Review of Systems: Negative except as noted in the HPI. Denies N/V/F/Ch.  Past Medical History:  Diagnosis Date  . Cerebrovascular disease   . Hypertension   . Type 2 diabetes mellitus (HCC)     Current Outpatient Medications:  .  atorvastatin (LIPITOR) 20 MG tablet, TAKE 1 TABLET(20 MG) BY MOUTH DAILY, Disp: 90 tablet, Rfl: 0 .  ciclopirox (PENLAC) 8 % solution, Apply topically at bedtime. Apply one coat toe each nail before bedtime. Remove every 7 days with polish remover., Disp: 6.6 mL, Rfl: 11 .  gabapentin (NEURONTIN) 800 MG tablet, Take 800 mg by mouth 3 (three) times daily., Disp: , Rfl:  .  Iron, Ferrous Sulfate, 325 (65 Fe) MG TABS, Take 325 mg by mouth daily., Disp: 30 tablet, Rfl: 3 .  losartan (COZAAR) 50 MG tablet, TAKE 1 TABLET BY MOUTH ONCE DAILY FOR BLOOD PRESSURE, Disp: 90 tablet, Rfl: 0 .  metFORMIN (GLUCOPHAGE-XR) 500 MG 24 hr tablet, TAKE 3 TABLETS BY MOUTH QD WITH EVENING MEAL FOR DIABETES, Disp: , Rfl:  .  oxyCODONE-acetaminophen (PERCOCET) 10-325 MG tablet, TAKE 1 TABLET BY MOUTH 4 TIMES DAILY AS NEEDED, Disp: ,  Rfl:  .  promethazine (PHENERGAN) 12.5 MG tablet, Take 1 tablet (12.5 mg total) by mouth every 8 (eight) hours as needed for nausea or vomiting., Disp: 30 tablet, Rfl: 0  Social History   Tobacco Use  Smoking Status Former Smoker  Smokeless Tobacco Never Used    No Known Allergies Objective:  There were no vitals filed for this visit. There is no height or weight on file to calculate BMI. Constitutional Well developed. Well nourished.  Vascular Dorsalis pedis pulses palpable bilaterally. Posterior tibial pulses palpable bilaterally. Capillary refill normal to all digits.  No cyanosis or clubbing noted. Pedal hair growth normal.  Neurologic Normal speech. Oriented to person, place, and time. Epicritic sensation to light touch grossly present bilaterally.  Dermatologic Nails well groomed and normal in appearance. No open wounds. No skin lesions.  Porokeratosis noted on the right lateral foot with a nucleated central core.  Orthopedic:  No pain with range of motion of the left metatarsophalangeal joint active and passive.  No pain on palpation to the left first metatarsophalangeal joint capsulitis.  Considerable improvement in pain noted.   Radiographs: None Assessment:   1. Pain in right foot   2. Porokeratosis   3. Capsulitis of metatarsophalangeal (MTP) joint of left foot    Plan:  Patient was evaluated and treated and all questions answered.  Left  first metatarsophalangeal joint bunion deformity pain resolved -The steroid injection that was given by Dr. Paulla Dolly, has completely resolve the pain.  I will hold off on further discussion of surgical realignment of the bones at this time.  However I have asked the patient to come back and see me if the pain returns.  Patient states understanding  Right porokeratosis lateral foot -I explained to the patient the etiology of porokeratosis and various treatment options associated with it.  I believe patient will benefit from  debridement of the porokeratosis with excision of the nucleated core to give her temporary pain relief.  Patient states understanding would like to have debridement of the porokeratosis.  No follow-ups on file.

## 2019-08-05 ENCOUNTER — Other Ambulatory Visit: Payer: Self-pay

## 2019-08-05 MED ORDER — IRON (FERROUS SULFATE) 325 (65 FE) MG PO TABS: 325.0000 mg | ORAL_TABLET | Freq: Every day | ORAL | 3 refills | Status: DC

## 2019-08-31 ENCOUNTER — Ambulatory Visit (INDEPENDENT_AMBULATORY_CARE_PROVIDER_SITE_OTHER): Payer: Medicaid Other | Admitting: Family Medicine

## 2019-08-31 ENCOUNTER — Other Ambulatory Visit: Payer: Self-pay

## 2019-08-31 ENCOUNTER — Encounter: Payer: Self-pay | Admitting: Family Medicine

## 2019-08-31 VITALS — BP 114/82 | HR 79 | Wt 198.6 lb

## 2019-08-31 DIAGNOSIS — M25512 Pain in left shoulder: Secondary | ICD-10-CM

## 2019-08-31 DIAGNOSIS — G8929 Other chronic pain: Secondary | ICD-10-CM | POA: Diagnosis not present

## 2019-08-31 MED ORDER — NAPROXEN 500 MG PO TABS: 500.0000 mg | ORAL_TABLET | Freq: Two times a day (BID) | ORAL | 0 refills | Status: AC

## 2019-08-31 NOTE — Progress Notes (Signed)
    SUBJECTIVE:   CHIEF COMPLAINT / HPI:   Acute on chronic left shoulder pain. Patient presents with 3 days of left shoulder pain.  History of bilateral shoulder "surgery" due to "arthritis".  Patient denies any injury to the shoulder.  The pain is anterior.  Does radiate somewhat down her left arm.  Patient takes oxycodone and gabapentin for chronic pain.  Patient's daily activities are limited by the extensive pain including dressing such as changing her shirt.  PERTINENT  PMH / PSH:  Chronic pain with opioids OBJECTIVE:   BP 114/82   Pulse 79   Wt 198 lb 9.6 oz (90.1 kg)   SpO2 100%   Gen: NAD, resting comfortably MSK: Left shoulder tender to palpation over the [facility] joint, patient has limited extension, abduction, abduction flexion extension, external rotation all due to pain, limited biceps and triceps participation in strength assessment due to pain, old surgical scars over the shoulders bilaterally Skin: warm, dry Neuro: grossly normal, moves all extremities Psych: Normal affect and thought content   ASSESSMENT/PLAN:   No problem-specific Assessment & Plan notes found for this encounter.     Bonnita Hollow, MD [place]

## 2019-08-31 NOTE — Assessment & Plan Note (Signed)
Acute on chronic shoulder pain.  Limited mobility throughout exam.  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 -2-week trial of NSAIDs in addition to patient's chronic pain management -Follow-up as needed

## 2019-09-17 ENCOUNTER — Telehealth: Payer: Self-pay

## 2019-09-17 ENCOUNTER — Other Ambulatory Visit: Payer: Self-pay | Admitting: *Deleted

## 2019-09-17 NOTE — Telephone Encounter (Signed)
Pt calling nurse line to request a referral to a neurologist. Please call pt at [phone]. Ottis Stain, CMA

## 2019-09-18 MED ORDER — METFORMIN HCL ER 500 MG PO TB24: ORAL_TABLET | ORAL | 0 refills | Status: DC

## 2019-09-21 NOTE — Telephone Encounter (Signed)
Pt called back.  Appt made to discuss referrals as she is not sure if its a neurologist or orthopedist. Christen Bame, CMA

## 2019-09-28 ENCOUNTER — Ambulatory Visit: Payer: Medicaid Other | Admitting: Family Medicine

## 2019-09-28 ENCOUNTER — Other Ambulatory Visit: Payer: Self-pay

## 2019-09-28 MED ORDER — ATORVASTATIN CALCIUM 20 MG PO TABS: ORAL_TABLET | ORAL | 0 refills | Status: DC

## 2019-10-06 ENCOUNTER — Ambulatory Visit (INDEPENDENT_AMBULATORY_CARE_PROVIDER_SITE_OTHER): Payer: Medicaid Other | Admitting: Family Medicine

## 2019-10-06 ENCOUNTER — Other Ambulatory Visit: Payer: Self-pay

## 2019-10-06 ENCOUNTER — Encounter: Payer: Self-pay | Admitting: Family Medicine

## 2019-10-06 VITALS — BP 110/60 | HR 75 | Ht 65.0 in | Wt 197.0 lb

## 2019-10-06 DIAGNOSIS — K089 Disorder of teeth and supporting structures, unspecified: Secondary | ICD-10-CM

## 2019-10-06 DIAGNOSIS — G8929 Other chronic pain: Secondary | ICD-10-CM

## 2019-10-06 DIAGNOSIS — M25562 Pain in left knee: Secondary | ICD-10-CM | POA: Diagnosis not present

## 2019-10-06 DIAGNOSIS — M25561 Pain in right knee: Secondary | ICD-10-CM

## 2019-10-06 DIAGNOSIS — R7303 Prediabetes: Secondary | ICD-10-CM

## 2019-10-06 DIAGNOSIS — Z0000 Encounter for general adult medical examination without abnormal findings: Secondary | ICD-10-CM | POA: Diagnosis not present

## 2019-10-06 DIAGNOSIS — M62838 Other muscle spasm: Secondary | ICD-10-CM

## 2019-10-06 DIAGNOSIS — R251 Tremor, unspecified: Secondary | ICD-10-CM

## 2019-10-06 NOTE — Patient Instructions (Signed)
It was great seeing you today!  I am sorry about your knee pain and your leg shaking.  For this problem I have referred you to orthopedics and neurology respectively.  In regards to your dental health and your eye exam I gave you a list of dentists, an optometrist in the area that take Medicaid.  I would give them a call to see which works for you.

## 2019-10-08 DIAGNOSIS — M25561 Pain in right knee: Secondary | ICD-10-CM | POA: Insufficient documentation

## 2019-10-08 DIAGNOSIS — R251 Tremor, unspecified: Secondary | ICD-10-CM | POA: Insufficient documentation

## 2019-10-08 DIAGNOSIS — K089 Disorder of teeth and supporting structures, unspecified: Secondary | ICD-10-CM | POA: Insufficient documentation

## 2019-10-08 DIAGNOSIS — M25562 Pain in left knee: Secondary | ICD-10-CM | POA: Insufficient documentation

## 2019-10-08 DIAGNOSIS — Z0000 Encounter for general adult medical examination without abnormal findings: Secondary | ICD-10-CM | POA: Insufficient documentation

## 2019-10-08 NOTE — Progress Notes (Signed)
   CHIEF COMPLAINT / HPI: 64 year old female who presents for bilateral knee pain, bilateral legs shaking, dental work, ophthalmology work.  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  She states that she has having intractable pain with her knee replacements and this is strongly affecting her ability to do ADLs and her work.  She is requesting referral to orthopedics for further evaluation.  The patient does have her pain managed by pain management.  Takes Percocet 10 mg every 6 hours  Patient states that she is also been having bilateral leg shaking.  This is intermittent, but has become more frequent recently.  She does not have any pain associated with the shaking.  She is not having any other focal neurologic deficits, no other complaints.  Patient is requesting a neurology referral for this issue.  Her to have all the work-up done for this problem with specialist.  Patient is requesting information on ophthalmologist and dentists in the area who will take Medicaid.   PERTINENT  PMH / PSH:    OBJECTIVE: BP 110/60   Pulse 75   Ht 5\' 5"  (1.651 m)   Wt 197 lb (89.4 kg)   SpO2 99%   BMI 32.78 kg/m   Gen: 64 year old African-American female, no acute distress, resting CV: Skin warm and dry Resp: No sensory muscle use, no respiratory distress Neuro: Alert and oriented, Speech clear, No gross deficits  Dental: Very poor dentition.  Most of her front teeth are still intact, but is missing the majority of her posterior teeth upper and lower jaw.  ASSESSMENT / PLAN:  Bilateral knee pain Unclear etiology of her bilateral knee pain.  Likely to do with her bilateral knee replacements.  This to the pain never really improved after replacements were placed.  Will refer to orthopedics for this issue.  Shaking Patient with apparent shaking in her legs.  Requesting a neurology referral for this issue.  Unclear etiology.  Could consider getting B12, folate,  iron studies to rule out reversible causes.  Ultimately placed neurology referral at patient request.  Poor dentition Very poor dentition.  I did give her a handout for dentists in the area that take Medicaid  Pre-diabetes Needs ophthalmology exam.  I gave her a handout for ophthalmologist in the area that take Medicaid.  Healthcare maintenance Overdue for a variety of health maintenance items.  I did asked the patient to come back in a few weeks to address these.    Guadalupe Dawn MD PGY-3 Family Medicine Resident [place]

## 2019-10-08 NOTE — Assessment & Plan Note (Signed)
Very poor dentition.  I did give her a handout for dentists in the area that take Medicaid

## 2019-10-08 NOTE — Assessment & Plan Note (Signed)
Needs ophthalmology exam.  I gave her a handout for ophthalmologist in the area that take Medicaid.

## 2019-10-08 NOTE — Assessment & Plan Note (Signed)
Unclear etiology of her bilateral knee pain.  Likely to do with her bilateral knee replacements.  This to the pain never really improved after replacements were placed.  Will refer to orthopedics for this issue.

## 2019-10-08 NOTE — Assessment & Plan Note (Signed)
Overdue for a variety of health maintenance items.  I did asked the patient to come back in a few weeks to address these.

## 2019-10-08 NOTE — Assessment & Plan Note (Signed)
Patient with apparent shaking in her legs.  Requesting a neurology referral for this issue.  Unclear etiology.  Could consider getting B12, folate, iron studies to rule out reversible causes.  Ultimately placed neurology referral at patient request.

## 2019-10-09 ENCOUNTER — Encounter: Payer: Self-pay | Admitting: Neurology

## 2019-10-15 ENCOUNTER — Other Ambulatory Visit: Payer: Self-pay

## 2019-10-15 MED ORDER — PROMETHAZINE HCL 12.5 MG PO TABS: 12.5000 mg | ORAL_TABLET | Freq: Three times a day (TID) | ORAL | 0 refills | Status: DC | PRN

## 2019-10-19 ENCOUNTER — Ambulatory Visit: Payer: Medicaid Other | Admitting: Orthopaedic Surgery

## 2019-10-21 ENCOUNTER — Ambulatory Visit: Payer: Self-pay

## 2019-10-21 ENCOUNTER — Other Ambulatory Visit: Payer: Self-pay

## 2019-10-21 ENCOUNTER — Ambulatory Visit (INDEPENDENT_AMBULATORY_CARE_PROVIDER_SITE_OTHER): Payer: Medicaid Other

## 2019-10-21 ENCOUNTER — Ambulatory Visit (INDEPENDENT_AMBULATORY_CARE_PROVIDER_SITE_OTHER): Payer: Medicaid Other | Admitting: Orthopaedic Surgery

## 2019-10-21 DIAGNOSIS — M25561 Pain in right knee: Secondary | ICD-10-CM

## 2019-10-21 DIAGNOSIS — M25562 Pain in left knee: Secondary | ICD-10-CM

## 2019-10-21 NOTE — Progress Notes (Signed)
Office Visit Note   Patient: Karolyna Briscoe           Date of Birth: 01/03/1956           MRN: [id number] Visit Date: 10/21/2019              Requested by: Dickie La, MD [address],  [place] [address] PCP: Guadalupe Dawn, MD   Assessment & Plan: Visit Diagnoses:  1. Pain in both knees, unspecified chronicity     Plan: Given the swelling of her left knee and the synovitis, there is certainly worrisome findings for polyethylene liner wear.  I do feel it is essential to obtain a three-phase bone scan to rule out prosthetic loosening especially for the left total knee arthroplasty.  This knee [place] 10 years ago and [place].  I have the x-rays and will need to find out what the actual implant vendor is to determine whether or not components would be available to switch out the polyethylene liner but only if needed.  I do feel that the remainder of her unsteadiness is related to residual stroke issues.  She is also on Percocet and gabapentin chronically and does ambulate with a quad cane.  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  All questions and concerns were answered and addressed.  We will see her back after the three-phase bone scan.  Follow-Up Instructions: Return in about 3 weeks (around 11/11/2019).   Orders:  Orders Placed This Encounter  Procedures  . XR Knee 1-2 Views Left  . XR Knee 1-2 Views Right   No orders of the defined types were placed in this encounter.     Procedures: No procedures performed   Clinical Data: No additional findings.   Subjective: Chief Complaint  Patient presents with  . Left Knee - Pain  . Right Knee - Pain  The patient is a 64 year old female who comes in for evaluation treatment of bilateral knee pain with the left worse than the right.  She does have a history of knee replacements done elsewhere.  Her left knee was replaced in 2011 and the  right knee was placed in 2017.  She did have a mild stroke in 2010.  She ambulates with a quad cane.  She does report bilateral lower extremity weakness and just shakiness in her legs is what she describes.  She is on chronic Percocet and gabapentin.  She apparently is been dealing with issues of a left great toe that is hurting and she is apparently having surgery at some point by podiatry.  HPI  Review of Systems She currently denies any chest pain or shortness of breath.  She denies any fever, chills, nausea, vomiting  Objective: Vital Signs: There were no vitals taken for this visit.  Physical Exam She is alert and oriented x3 and in no acute distress. Ortho Exam On examination of her knees there is some slight warmth and swelling of her left knee with no swelling of the right knee.  Both knees feel ligamentously stable and actually have a good arc of motion to them.  Her calves are soft bilaterally.  She does have weak quads bilaterally. Specialty Comments:  No specialty comments available.  Imaging: XR Knee 1-2 Views Left  Result Date: 10/21/2019 2 views of the left knee show a total knee arthroplasty with no gross evidence of loosening.  The joint space  is narrow which could suggest polyliner wear.  XR Knee 1-2 Views Right  Result Date: 10/21/2019 2 views of the right knee show well-seated press-fit total knee arthroplasty with no complicating features.  There is no effusion or evidence of loosening.    PMFS History: Patient Active Problem List   Diagnosis Date Noted  . Bilateral knee pain 10/08/2019  . Shaking 10/08/2019  . Poor dentition 10/08/2019  . Healthcare maintenance 10/08/2019  . Chronic left shoulder pain 08/31/2019  . Cerumen impaction 04/22/2019  . Hemorrhoids 04/04/2019  . History of stroke 01/01/2019  . Hypertension associated with diabetes ([place]) 01/01/2019  . Pre-diabetes 01/01/2019  . Hot flashes 01/01/2019  . Onychomycosis 01/01/2019   Past Medical  History:  Diagnosis Date  . Cerebrovascular disease   . Hypertension   . Type 2 diabetes mellitus (HCC)     No family history on file.  Past Surgical History:  Procedure Laterality Date  . THYROIDECTOMY     Social History   Occupational History  . Not on file  Tobacco Use  . Smoking status: Former Research scientist (life sciences)  . Smokeless tobacco: Never Used  Substance and Sexual Activity  . Alcohol use: Never  . Drug use: Never  . Sexual activity: Not on file

## 2019-10-22 ENCOUNTER — Other Ambulatory Visit: Payer: Self-pay

## 2019-10-22 MED ORDER — METFORMIN HCL ER 500 MG PO TB24: ORAL_TABLET | ORAL | 0 refills | Status: DC

## 2019-11-05 ENCOUNTER — Encounter (HOSPITAL_COMMUNITY)
Admission: RE | Admit: 2019-11-05 | Discharge: 2019-11-05 | Disposition: A | Discharge status: Home / Self Care | Payer: Medicaid Other | Source: Ambulatory Visit | Attending: Orthopaedic Surgery | Admitting: Orthopaedic Surgery

## 2019-11-05 ENCOUNTER — Other Ambulatory Visit: Payer: Self-pay

## 2019-11-05 DIAGNOSIS — M25562 Pain in left knee: Secondary | ICD-10-CM | POA: Diagnosis not present

## 2019-11-05 DIAGNOSIS — M25561 Pain in right knee: Secondary | ICD-10-CM | POA: Diagnosis present

## 2019-11-05 MED ORDER — TECHNETIUM TC 99M MEDRONATE IV KIT
19.8000 | PACK | Freq: Once | INTRAVENOUS | Status: AC | PRN
  Administered 2019-11-05: 19.8 via INTRAVENOUS

## 2019-11-11 ENCOUNTER — Ambulatory Visit: Payer: Medicaid Other | Admitting: Orthopaedic Surgery

## 2019-11-11 ENCOUNTER — Telehealth: Payer: Self-pay

## 2019-11-11 NOTE — Telephone Encounter (Signed)
Patient calls nurse line reporting she pulled a muscle yesterday in her neck, that has radiated down her back today. Patient states she has an apt on 6/1, however she wanting a muscle relaxer to sent to her pharmacy in the meantime. Pleas advise.

## 2019-11-12 NOTE — Telephone Encounter (Signed)
Patient calls nurse line to follow up to see if provider is going to send in rx for muscle relaxer.   To PCP  Talbot Grumbling, RN

## 2019-11-18 MED ORDER — BACLOFEN 10 MG PO TABS: 10.0000 mg | ORAL_TABLET | Freq: Three times a day (TID) | ORAL | 0 refills | Status: DC

## 2019-11-18 NOTE — Addendum Note (Signed)
Addended by: Pauletta Browns on: 11/18/2019 10:11 AM   Modules accepted: Orders

## 2019-11-18 NOTE — Telephone Encounter (Signed)
Sent in one week supply of baclofen  Guadalupe Dawn MD PGY-3 Family Medicine Resident

## 2019-11-24 ENCOUNTER — Ambulatory Visit: Payer: Medicaid Other | Admitting: Family Medicine

## 2019-11-25 ENCOUNTER — Ambulatory Visit (INDEPENDENT_AMBULATORY_CARE_PROVIDER_SITE_OTHER): Payer: Medicaid Other | Admitting: Orthopaedic Surgery

## 2019-11-25 ENCOUNTER — Encounter: Payer: Self-pay | Admitting: Orthopaedic Surgery

## 2019-11-25 ENCOUNTER — Other Ambulatory Visit: Payer: Self-pay

## 2019-11-25 DIAGNOSIS — M25562 Pain in left knee: Secondary | ICD-10-CM

## 2019-11-25 DIAGNOSIS — Z96653 Presence of artificial knee joint, bilateral: Secondary | ICD-10-CM | POA: Diagnosis not present

## 2019-11-25 DIAGNOSIS — M25561 Pain in right knee: Secondary | ICD-10-CM | POA: Diagnosis not present

## 2019-11-25 NOTE — Progress Notes (Signed)
The patient is a 64 year old female who is returning for follow-up after having a three-phase bone scan to rule out prosthetic loosening of bilateral knee replacements.  Her right knee was replaced in 2017 and I believe her left knee was replaced somewhere between 2010 and 2012.  These were both done elsewhere.  These were both press-fit implants and of different brands.  The left knee is examining in the right knee is a Stryker knee.  She is also been affected by stroke and this affects her left side.  She says the right knee hurts her the worst and does feel unstable at times.  The three-phase bone scan did not show any evidence of prosthetic loosening.  The plain films also correlate with this.  On exam neither knee has an effusion.  The left knee feels ligamentously stable with the right knee has more play with varus and valgus stressing suggesting that the implant polyliner needs upsizing.  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  I went over this with her in detail.  She is someone who is in chronic pain management due to back and shoulders as well as other chronic issues.  I did explain what surgery on her knees would involve and I would certainly consider this on her right knee.  She will think about this.  All questions and concerns were answered addressed.  I did give her a note to give to the [employer] that recommends she lived on a ground level apartment.

## 2019-12-03 ENCOUNTER — Ambulatory Visit (INDEPENDENT_AMBULATORY_CARE_PROVIDER_SITE_OTHER): Payer: Medicaid Other | Admitting: Family Medicine

## 2019-12-03 ENCOUNTER — Encounter: Payer: Self-pay | Admitting: Family Medicine

## 2019-12-03 ENCOUNTER — Other Ambulatory Visit: Payer: Self-pay

## 2019-12-03 VITALS — BP 118/82 | HR 91 | Ht 65.0 in | Wt 201.2 lb

## 2019-12-03 DIAGNOSIS — M25562 Pain in left knee: Secondary | ICD-10-CM | POA: Diagnosis not present

## 2019-12-03 DIAGNOSIS — R232 Flushing: Secondary | ICD-10-CM

## 2019-12-03 DIAGNOSIS — M25561 Pain in right knee: Secondary | ICD-10-CM | POA: Diagnosis not present

## 2019-12-03 DIAGNOSIS — R7303 Prediabetes: Secondary | ICD-10-CM

## 2019-12-03 DIAGNOSIS — G8929 Other chronic pain: Secondary | ICD-10-CM

## 2019-12-03 LAB — POCT GLYCOSYLATED HEMOGLOBIN (HGB A1C): HbA1c, POC (controlled diabetic range): 6.1 % (ref 0.0–7.0)

## 2019-12-03 MED ORDER — PREMARIN 0.625 MG/GM VA CREA
1.0000 | TOPICAL_CREAM | Freq: Every day | VAGINAL | 0 refills | Status: DC
Start: 2019-12-03 — End: 2020-01-02

## 2019-12-03 NOTE — Patient Instructions (Signed)
It was great seeing you today!  I am sorry about your hot flashes.  It looks like Medicaid does cover vaginal estrogen, so we can try this.  This may be less effective than systemic estrogen, but it does not carry the theoretical effects of increased cancers.  Another option is that we can try down the road would be antidepressant medications.  These are oftentimes very effective for menopausal symptoms.  I gave you a letter stating the need to live on the first floor of your apartment.  If you need for me to fill out anything else please let me know.  Also placed another referral for pain management to get you into a clinic much closer to you.

## 2019-12-04 ENCOUNTER — Encounter: Payer: Self-pay | Admitting: Family Medicine

## 2019-12-04 NOTE — Progress Notes (Signed)
   CHIEF COMPLAINT / HPI: 64 year old female who presents for hot flashes and vaginal dryness.  She is also requesting a referral to a different pain clinic that is much closer to where she lives at.  Hemoglobin A1c checked and was 6.1 from 5.8.Marland Kitchen  Patient states that she is having menopausal symptoms of hot flashes, vaginal dryness, and some mood swings.  She is wondering if there is any done about it.  She is interested in estrogen therapy.  Patient states that the drive to her pain management clinic has become too far for her and she would like to get one closer to her either in [place] or in [place].  PERTINENT  PMH / PSH:    OBJECTIVE: BP 118/82   Pulse 91   Ht 5\' 5"  (1.651 m)   Wt 201 lb 3.2 oz (91.3 kg)   SpO2 100%   BMI 33.48 kg/m   Gen: 64 year old African-American female, no acute distress CV: Regular rate and rhythm, no M/R/G Resp: Lungs clear to auscultation bilaterally, no accessory muscle use Neuro: Alert and oriented, Speech clear, No gross deficits   ASSESSMENT / PLAN:  Hot flashes Secondary to menopause.  We will try Premarin vaginal estrogen.  I explained that vaginal application likely not as effective as systemic, but does not carry the theoretical risk of increased cancer.  Patient in agreement and would like to try the cream.  1 applicatorful daily.  Can follow-up as needed for this issue.  Pre-diabetes A1c 6.1 from 5.8.  Can recheck in 6 to 12 months.  Needs Ophtho exam.  Bilateral knee pain Placed referral for new pain management clinic.  She would like to go to [place] in [facility], place referral for this institution.    Guadalupe Dawn MD PGY-3 Family Medicine Resident [place]

## 2019-12-04 NOTE — Assessment & Plan Note (Addendum)
A1c 6.1 from 5.8.  Can recheck in 6 to 12 months.  Needs Ophtho exam.

## 2019-12-04 NOTE — Assessment & Plan Note (Signed)
Secondary to menopause.  We will try Premarin vaginal estrogen.  I explained that vaginal application likely not as effective as systemic, but does not carry the theoretical risk of increased cancer.  Patient in agreement and would like to try the cream.  1 applicatorful daily.  Can follow-up as needed for this issue.

## 2019-12-04 NOTE — Assessment & Plan Note (Signed)
Placed referral for new pain management clinic.  She would like to go to [place] in [facility], place referral for this institution.

## 2019-12-16 NOTE — Progress Notes (Deleted)
NEUROLOGY CONSULTATION NOTE  Monica Watson MRN: [id number] DOB: 05-30-1956  Referring provider: Dorcas Mcmurray, MD Primary care provider: Guadalupe Dawn, MD  Reason for consult:  Bilateral lower extremity spasms and shaking  HISTORY OF PRESENT ILLNESS: Monica Watson is a 64 year old ***-handed female with HTN, type 2 diabetes and history of stroke who presents for bilateral leg spasms and shaking.  History supplemented by primary care and orthopedic notes.  She has chronic bilateral knee pain and underwent bilateral knee replacement, the left in 2010-2012 and the right in 2017.  She is followed by pain management.  She has some residual left sided *** due to a stroke ***.  She followed up with orthopedics ***  PAST MEDICAL HISTORY: Past Medical History:  Diagnosis Date  . Cerebrovascular disease   . Hypertension   . Type 2 diabetes mellitus ([place])     PAST SURGICAL HISTORY: Past Surgical History:  Procedure Laterality Date  . THYROIDECTOMY      MEDICATIONS: Current Outpatient Medications on File Prior to Visit  Medication Sig Dispense Refill  . atorvastatin (LIPITOR) 20 MG tablet TAKE 1 TABLET(20 MG) BY MOUTH DAILY 90 tablet 0  . baclofen (LIORESAL) 10 MG tablet Take 1 tablet (10 mg total) by mouth 3 (three) times daily. 21 each 0  . ciclopirox (PENLAC) 8 % solution Apply topically at bedtime. Apply one coat toe each nail before bedtime. Remove every 7 days with polish remover. 6.6 mL 11  . conjugated estrogens (PREMARIN) vaginal cream Place 1 Applicatorful vaginally daily. 42.5 g 0  . gabapentin (NEURONTIN) 800 MG tablet Take 800 mg by mouth 3 (three) times daily.    . Iron, Ferrous Sulfate, 325 (65 Fe) MG TABS Take 325 mg by mouth daily. 30 tablet 3  . losartan (COZAAR) 50 MG tablet TAKE 1 TABLET BY MOUTH ONCE DAILY FOR BLOOD PRESSURE 90 tablet 0  . metFORMIN (GLUCOPHAGE-XR) 500 MG 24 hr tablet TAKE 2 TABLETS BY MOUTH QD WITH EVENING MEAL FOR DIABETES 90 tablet 0  .  oxyCODONE-acetaminophen (PERCOCET) 10-325 MG tablet TAKE 1 TABLET BY MOUTH 4 TIMES DAILY AS NEEDED    . promethazine (PHENERGAN) 12.5 MG tablet Take 1 tablet (12.5 mg total) by mouth every 8 (eight) hours as needed for nausea or vomiting. 30 tablet 0   No current facility-administered medications on file prior to visit.    ALLERGIES: No Known Allergies  FAMILY HISTORY: No family history on file. ***.  SOCIAL HISTORY: Social History   Socioeconomic History  . Marital status: Single    Spouse name: Not on file  . Number of children: Not on file  . Years of education: Not on file  . Highest education level: Not on file  Occupational History  . Not on file  Tobacco Use  . Smoking status: Former Research scientist (life sciences)  . Smokeless tobacco: Never Used  Substance and Sexual Activity  . Alcohol use: Never  . Drug use: Never  . Sexual activity: Not on file  Other Topics Concern  . Not on file  Social History Narrative   Retired    Investment banker, operational of Radio broadcast assistant Strain:   . Difficulty of Paying Living Expenses:   Food Insecurity:   . Worried About Charity fundraiser in the Last Year:   . Arboriculturist in the Last Year:   Transportation Needs:   . Film/video editor (Medical):   Marland Kitchen Lack of Transportation (Non-Medical):   Physical Activity:   .  Days of Exercise per Week:   . Minutes of Exercise per Session:   Stress:   . Feeling of Stress :   Social Connections:   . Frequency of Communication with Friends and Family:   . Frequency of Social Gatherings with Friends and Family:   . Attends Religious Services:   . Active Member of Clubs or Organizations:   . Attends Archivist Meetings:   Marland Kitchen Marital Status:   Intimate Partner Violence:   . Fear of Current or Ex-Partner:   . Emotionally Abused:   Marland Kitchen Physically Abused:   . Sexually Abused:     REVIEW OF SYSTEMS: Constitutional: No fevers, chills, or sweats, no generalized fatigue, change in  appetite Eyes: No visual changes, double vision, eye pain Ear, nose and throat: No hearing loss, ear pain, nasal congestion, sore throat Cardiovascular: No chest pain, palpitations Respiratory:  No shortness of breath at rest or with exertion, wheezes GastrointestinaI: No nausea, vomiting, diarrhea, abdominal pain, fecal incontinence Genitourinary:  No dysuria, urinary retention or frequency Musculoskeletal:  No neck pain, back pain Integumentary: No rash, pruritus, skin lesions Neurological: as above Psychiatric: No depression, insomnia, anxiety Endocrine: No palpitations, fatigue, diaphoresis, mood swings, change in appetite, change in weight, increased thirst Hematologic/Lymphatic:  No purpura, petechiae. Allergic/Immunologic: no itchy/runny eyes, nasal congestion, recent allergic reactions, rashes  PHYSICAL EXAM: *** General: No acute distress.  Patient appears ***-groomed.  *** Head:  Normocephalic/atraumatic Eyes:  fundi examined but not visualized Neck: supple, no paraspinal tenderness, full range of motion Back: No paraspinal tenderness Heart: regular rate and rhythm Lungs: Clear to auscultation bilaterally. Vascular: No carotid bruits. Neurological Exam: Mental status: alert and oriented to person, place, and time, recent and remote memory intact, fund of knowledge intact, attention and concentration intact, speech fluent and not dysarthric, language intact. Cranial nerves: CN I: not tested CN II: pupils equal, round and reactive to light, visual fields intact CN III, IV, VI:  full range of motion, no nystagmus, no ptosis CN V: facial sensation intact CN VII: upper and lower face symmetric CN VIII: hearing intact CN IX, X: gag intact, uvula midline CN XI: sternocleidomastoid and trapezius muscles intact CN XII: tongue midline Bulk & Tone: normal, no fasciculations. Motor:  5/5 throughout *** Sensation:  Pinprick *** temperature *** and vibration sensation intact.   ***. Deep Tendon Reflexes:  2+ throughout, *** toes downgoing.  *** Finger to nose testing:  Without dysmetria.  *** Heel to shin:  Without dysmetria.  *** Gait:  Normal station and stride.  Able to turn and tandem walk. Romberg ***.  IMPRESSION: ***  PLAN: ***  Thank you for allowing me to take part in the care of this patient.  Metta Clines, DO  CC: ***

## 2019-12-17 ENCOUNTER — Ambulatory Visit: Payer: Medicaid Other | Admitting: Neurology

## 2019-12-22 ENCOUNTER — Telehealth: Payer: Self-pay | Admitting: Orthopaedic Surgery

## 2019-12-22 NOTE — Telephone Encounter (Signed)
Patient called asked if the form that was sent to Dr Ninfa Linden for him complete from the housing authority have been received and sent back. Patient said the form is to help her to be able to move to another unit. Patient said the place she is trying to move from has 12 steps and she can not go up and down them. The number to contact patient is [phone]

## 2019-12-22 NOTE — Telephone Encounter (Signed)
Either one of you guys know what this is?

## 2019-12-23 ENCOUNTER — Other Ambulatory Visit: Payer: Self-pay

## 2019-12-23 MED ORDER — IRON (FERROUS SULFATE) 325 (65 FE) MG PO TABS: 325.0000 mg | ORAL_TABLET | Freq: Every day | ORAL | 3 refills | Status: DC

## 2019-12-23 NOTE — Telephone Encounter (Signed)
I haven't seen this

## 2019-12-24 ENCOUNTER — Other Ambulatory Visit: Payer: Self-pay

## 2019-12-24 DIAGNOSIS — E781 Pure hyperglyceridemia: Secondary | ICD-10-CM

## 2019-12-24 DIAGNOSIS — Z8673 Personal history of transient ischemic attack (TIA), and cerebral infarction without residual deficits: Secondary | ICD-10-CM

## 2019-12-24 MED ORDER — ATORVASTATIN CALCIUM 20 MG PO TABS: ORAL_TABLET | ORAL | 0 refills | Status: DC

## 2019-12-24 NOTE — Telephone Encounter (Signed)
Patient aware to have them re-send it and "ATTN" it to me

## 2019-12-25 NOTE — Telephone Encounter (Signed)
Attempted to call patient.  No answer and no ability to leave voice mail.  Will try later.  Ozella Almond, [place]

## 2019-12-29 ENCOUNTER — Other Ambulatory Visit: Payer: Self-pay | Admitting: *Deleted

## 2019-12-31 ENCOUNTER — Ambulatory Visit (INDEPENDENT_AMBULATORY_CARE_PROVIDER_SITE_OTHER): Payer: Medicaid Other | Admitting: Family Medicine

## 2019-12-31 ENCOUNTER — Other Ambulatory Visit: Payer: Self-pay

## 2019-12-31 ENCOUNTER — Encounter: Payer: Self-pay | Admitting: Family Medicine

## 2019-12-31 VITALS — BP 120/76 | HR 93 | Ht 65.0 in | Wt 204.8 lb

## 2019-12-31 DIAGNOSIS — Z8673 Personal history of transient ischemic attack (TIA), and cerebral infarction without residual deficits: Secondary | ICD-10-CM | POA: Diagnosis not present

## 2019-12-31 DIAGNOSIS — Z8719 Personal history of other diseases of the digestive system: Secondary | ICD-10-CM

## 2019-12-31 DIAGNOSIS — M25561 Pain in right knee: Secondary | ICD-10-CM | POA: Diagnosis not present

## 2019-12-31 DIAGNOSIS — M25562 Pain in left knee: Secondary | ICD-10-CM | POA: Diagnosis not present

## 2019-12-31 DIAGNOSIS — I10 Essential (primary) hypertension: Secondary | ICD-10-CM | POA: Diagnosis not present

## 2019-12-31 DIAGNOSIS — H539 Unspecified visual disturbance: Secondary | ICD-10-CM | POA: Insufficient documentation

## 2019-12-31 DIAGNOSIS — G8929 Other chronic pain: Secondary | ICD-10-CM | POA: Diagnosis not present

## 2019-12-31 DIAGNOSIS — K648 Other hemorrhoids: Secondary | ICD-10-CM | POA: Diagnosis not present

## 2019-12-31 DIAGNOSIS — M172 Bilateral post-traumatic osteoarthritis of knee: Secondary | ICD-10-CM | POA: Diagnosis not present

## 2019-12-31 DIAGNOSIS — I152 Hypertension secondary to endocrine disorders: Secondary | ICD-10-CM

## 2019-12-31 DIAGNOSIS — E1159 Type 2 diabetes mellitus with other circulatory complications: Secondary | ICD-10-CM | POA: Diagnosis not present

## 2019-12-31 NOTE — Assessment & Plan Note (Signed)
Referred patient to ophthalmologist for eye exam. Has not had one in several years.

## 2019-12-31 NOTE — Assessment & Plan Note (Signed)
Patient being followed by her orthopedist. Had recent bone scan. Possibility of a re-left knee replacement.

## 2019-12-31 NOTE — Progress Notes (Signed)
    SUBJECTIVE:   CHIEF COMPLAINT / HPI:  64 year old female with a PMHx significant for stroke in 2010, b/l knee replacement, presents with complaints of vision changes, knee pain and leg shaking.   Vision Changes: Patient states that she has not had an eye exam in several years. Often finds that she has to move papers away from her when trying to read.   Bilateral Knee Pain:  She tells me that she has been seen by an orthopedist recently with talks of possibly re-replacing her left knee. She has pain in both knees but worse in her left. She lives above ground level and has trouble with steps. Currently she is in the process of trying to move to ground level.   Leg shaking She feels her legs shake when she stands on occasion but she denies any falls. Patient normally walks with a cane but did not bring it with her today. She is also followed by a pain specialist who manages her chronic back pain. Was unable to make her recent appointment with neurologist.  PERTINENT  PMH / [place]: History of stroke (2010), B/l knee replacements, hemorrhoids   OBJECTIVE:   BP 120/76   Pulse 93   Ht 5\' 5"  (1.651 m)   Wt 204 lb 12.8 oz (92.9 kg)   SpO2 97%   BMI 34.08 kg/m   General: In no acute distress, pleasant  HEENT: [place], clear TMs b/l, no erythema Cardiology: RRR, no murmurs Respiratory: CTAB, no increased work of breathing  MSK: 5/5 upper and lower extremity strength  Right Knee: tenderness to palpation at bilateral joint line, no edema, full AROM,  5/5 strength  Left Knee: tender to lateral joint line, suprapatellar tenderness on palpation, no  Edema, full AROM, 5/5 strength Extremities: 2+PT and radial pulses, no lower extremity edema     ASSESSMENT/PLAN:   Hemorrhoids Currently well controlled. Colonoscopy last year (2020), will try to get outside records. Taking iron, will obtain CBC today.   History of stroke Patient with history of stroke in 2010. She was unable to make her  neurology appointment. Gave her number to reschedule that appointment and gave her a referral for home physical therapy as she continues to feel shaky in her legs.   Bilateral knee pain Patient being followed by her orthopedist. Had recent bone scan. Possibility of a re-left knee replacement.   Vision changes Referred patient to ophthalmologist for eye exam. Has not had one in several years.      Sharion Settler, [place]

## 2019-12-31 NOTE — Assessment & Plan Note (Signed)
Patient with history of stroke in 2010. She was unable to make her neurology appointment. Gave her number to reschedule that appointment and gave her a referral for home physical therapy as she continues to feel shaky in her legs.

## 2019-12-31 NOTE — Assessment & Plan Note (Signed)
Currently well controlled. Colonoscopy last year (2020), will try to get outside records. Taking iron, will obtain CBC today.

## 2019-12-31 NOTE — Patient Instructions (Signed)
It was wonderful to see you today.  Please bring ALL of your medications with you to every visit.   Today we talked about:  Getting blood work, I will call you with the results. The number for Neurology to reschedule your appointment is [phone]. I will put in a referral for physical therapy and the eye doctor.   Thank you for choosing [facility].   Please call [phone] with any questions about today's appointment.  Please be sure to schedule follow up at the front  desk before you leave today.   Sharion Settler, DO PGY-1 Family Medicine

## 2020-01-01 ENCOUNTER — Telehealth: Payer: Self-pay | Admitting: Orthopaedic Surgery

## 2020-01-01 ENCOUNTER — Telehealth: Payer: Self-pay | Admitting: Family Medicine

## 2020-01-01 ENCOUNTER — Other Ambulatory Visit: Payer: Self-pay | Admitting: Family Medicine

## 2020-01-01 DIAGNOSIS — Z8673 Personal history of transient ischemic attack (TIA), and cerebral infarction without residual deficits: Secondary | ICD-10-CM

## 2020-01-01 DIAGNOSIS — E781 Pure hyperglyceridemia: Secondary | ICD-10-CM

## 2020-01-01 DIAGNOSIS — G8929 Other chronic pain: Secondary | ICD-10-CM

## 2020-01-01 LAB — CBC WITH DIFFERENTIAL/PLATELET
Basophils Absolute: 0 10*3/uL (ref 0.0–0.2)
Basos: 0 %
EOS (ABSOLUTE): 0.1 10*3/uL (ref 0.0–0.4)
Eos: 3 %
Hematocrit: 41.7 % (ref 34.0–46.6)
Hemoglobin: 14.2 g/dL (ref 11.1–15.9)
Immature Grans (Abs): 0 10*3/uL (ref 0.0–0.1)
Immature Granulocytes: 1 %
Lymphocytes Absolute: 2.3 10*3/uL (ref 0.7–3.1)
Lymphs: 54 %
MCH: 32.1 pg (ref 26.6–33.0)
MCHC: 34.1 g/dL (ref 31.5–35.7)
MCV: 94 fL (ref 79–97)
Monocytes Absolute: 0.3 10*3/uL (ref 0.1–0.9)
Monocytes: 8 %
Neutrophils Absolute: 1.4 10*3/uL (ref 1.4–7.0)
Neutrophils: 34 %
Platelets: 237 10*3/uL (ref 150–450)
RBC: 4.42 x10E6/uL (ref 3.77–5.28)
RDW: 11.7 % (ref 11.7–15.4)
WBC: 4.1 10*3/uL (ref 3.4–10.8)

## 2020-01-01 LAB — LIPID PANEL
Chol/HDL Ratio: 3.2 ratio (ref 0.0–4.4)
Cholesterol, Total: 123 mg/dL (ref 100–199)
HDL: 38 mg/dL — ABNORMAL LOW (ref >39)
LDL Chol Calc (NIH): 35 mg/dL (ref 0–99)
Triglycerides: 344 mg/dL — ABNORMAL HIGH (ref 0–149)
VLDL Cholesterol Cal: 50 mg/dL — ABNORMAL HIGH (ref 5–40)

## 2020-01-01 LAB — BASIC METABOLIC PANEL
BUN/Creatinine Ratio: 12 (ref 12–28)
BUN: 9 mg/dL (ref 8–27)
CO2: 22 mmol/L (ref 20–29)
Calcium: 9.3 mg/dL (ref 8.7–10.3)
Chloride: 104 mmol/L (ref 96–106)
Creatinine, Ser: 0.74 mg/dL (ref 0.57–1.00)
GFR calc Af Amer: 100 mL/min/{1.73_m2} (ref >59)
GFR calc non Af Amer: 87 mL/min/{1.73_m2} (ref >59)
Glucose: 146 mg/dL — ABNORMAL HIGH (ref 65–99)
Potassium: 4.1 mmol/L (ref 3.5–5.2)
Sodium: 139 mmol/L (ref 134–144)

## 2020-01-01 MED ORDER — BACLOFEN 10 MG PO TABS: 10.0000 mg | ORAL_TABLET | Freq: Three times a day (TID) | ORAL | 0 refills | Status: DC

## 2020-01-01 MED ORDER — METFORMIN HCL ER 500 MG PO TB24: ORAL_TABLET | ORAL | 0 refills | Status: DC

## 2020-01-01 MED ORDER — ATORVASTATIN CALCIUM 20 MG PO TABS: ORAL_TABLET | ORAL | 0 refills | Status: DC

## 2020-01-01 NOTE — Telephone Encounter (Signed)
[employer] called concerning a form they faxed back on June 16th. I asked that they resend it to [place] since we most likely wouldn't be able to track the first one down with it being 3-4 weeks ago. The form is a Verification for Reasonable Accomodation that needs to be completed and signed by the doctor.

## 2020-01-01 NOTE — Telephone Encounter (Signed)
Pt called wanting to know if we received a form from housing authorities; should be made to the attention of Dr.Blackman?   [phone]

## 2020-01-02 NOTE — Addendum Note (Signed)
Addended by: Owens Shark, Kenlynn Houde on: 01/02/2020 06:40 AM   Modules accepted: Orders

## 2020-01-04 NOTE — Telephone Encounter (Signed)
Still haven't received.

## 2020-01-05 NOTE — Telephone Encounter (Signed)
Patient aware we haven't received it and she will maybe physically bring it to me

## 2020-01-06 ENCOUNTER — Telehealth: Payer: Self-pay | Admitting: Orthopaedic Surgery

## 2020-01-06 NOTE — Telephone Encounter (Signed)
LMOM for patient letting her know I have not yet received any forms for her

## 2020-01-06 NOTE — Telephone Encounter (Signed)
Patient called.   Another provider of hers faxed over paperwork on her behalf. She wants to know if we have received it yet.   Call back: [phone]

## 2020-01-07 ENCOUNTER — Other Ambulatory Visit: Payer: Self-pay

## 2020-01-07 MED ORDER — METFORMIN HCL ER 500 MG PO TB24: ORAL_TABLET | ORAL | 0 refills | Status: DC

## 2020-01-07 MED ORDER — LOSARTAN POTASSIUM 50 MG PO TABS: ORAL_TABLET | ORAL | 0 refills | Status: DC

## 2020-01-11 ENCOUNTER — Other Ambulatory Visit: Payer: Self-pay | Admitting: Family Medicine

## 2020-01-11 ENCOUNTER — Telehealth: Payer: Self-pay | Admitting: Orthopaedic Surgery

## 2020-01-11 ENCOUNTER — Telehealth: Payer: Self-pay | Admitting: *Deleted

## 2020-01-11 DIAGNOSIS — R232 Flushing: Secondary | ICD-10-CM

## 2020-01-11 MED ORDER — PREMARIN 0.625 MG/GM VA CREA: 1.0000 | TOPICAL_CREAM | Freq: Every day | VAGINAL | 1 refills | Status: DC

## 2020-01-11 NOTE — Progress Notes (Signed)
Rx sent for premarin estrogen cream.

## 2020-01-11 NOTE — Telephone Encounter (Signed)
Pt called stating she spoke to someone this morning and they were supposed to reach out to [place] and fax over some paperwork and just wanted to make [place] aware.

## 2020-01-11 NOTE — Telephone Encounter (Signed)
I just went through faxes, nothing received.

## 2020-01-11 NOTE — Telephone Encounter (Signed)
Rx originally discontinued because patient stated it was not helping, however, she has now requested a refill. Will re-prescribe with one  refill.

## 2020-01-11 NOTE — Telephone Encounter (Signed)
From previous messages looks like some office notes are being faxed. Has anything come through on this patient?

## 2020-01-11 NOTE — Telephone Encounter (Signed)
Rx request for premarin vaginal cream 30GM. Not on med list. Please advise. Alyus Mofield Kennon Holter, CMA

## 2020-01-11 NOTE — Telephone Encounter (Signed)
Spoke with patient. States paperwork is coming from housing authority. Called and left message with housing authority at [place] fax number [phone]

## 2020-01-12 NOTE — Telephone Encounter (Signed)
Received form by mail today.

## 2020-01-12 NOTE — Telephone Encounter (Signed)
FYI, still nothing received.

## 2020-01-12 NOTE — Telephone Encounter (Signed)
Spoke with patient, advised we did receive form today. Advised Dr. Ninfa Linden is in surgery this afternoon and will be out of office from tomorrow until Tuesday. Patient stated someone from housing authority is in the process of finding her a new location today.

## 2020-01-13 ENCOUNTER — Telehealth: Payer: Self-pay

## 2020-01-13 ENCOUNTER — Telehealth: Payer: Self-pay | Admitting: Orthopaedic Surgery

## 2020-01-13 NOTE — Telephone Encounter (Signed)
Patient calls nurse line requesting refill on baclofen. This medication is not on current med list.   To PCP  Please advise. If appropriate, patient would like refill sent to [facility] on [address].   Talbot Grumbling, RN

## 2020-01-13 NOTE — Telephone Encounter (Signed)
Emailed [employer] Form [email or web address]

## 2020-01-14 ENCOUNTER — Other Ambulatory Visit: Payer: Self-pay | Admitting: Family Medicine

## 2020-01-14 DIAGNOSIS — G8929 Other chronic pain: Secondary | ICD-10-CM

## 2020-01-14 MED ORDER — BACLOFEN 10 MG PO TABS: 10.0000 mg | ORAL_TABLET | Freq: Three times a day (TID) | ORAL | 0 refills | Status: AC

## 2020-01-14 NOTE — Progress Notes (Signed)
Rx 1 week supply of Baclofen 10 mg TID. Has received this in the past for muscle/chronic pains.

## 2020-01-21 ENCOUNTER — Other Ambulatory Visit: Payer: Self-pay | Admitting: Family Medicine

## 2020-01-25 ENCOUNTER — Other Ambulatory Visit: Payer: Medicaid Other

## 2020-01-25 ENCOUNTER — Other Ambulatory Visit: Payer: Self-pay

## 2020-01-25 DIAGNOSIS — E781 Pure hyperglyceridemia: Secondary | ICD-10-CM

## 2020-01-26 ENCOUNTER — Encounter: Payer: Self-pay | Admitting: Family Medicine

## 2020-01-26 LAB — LIPID PANEL
Chol/HDL Ratio: 3.2 ratio (ref 0.0–4.4)
Cholesterol, Total: 144 mg/dL (ref 100–199)
HDL: 45 mg/dL (ref >39)
LDL Chol Calc (NIH): 74 mg/dL (ref 0–99)
Triglycerides: 146 mg/dL (ref 0–149)
VLDL Cholesterol Cal: 25 mg/dL (ref 5–40)

## 2020-02-13 ENCOUNTER — Other Ambulatory Visit: Payer: Self-pay | Admitting: Family Medicine

## 2020-02-13 DIAGNOSIS — R232 Flushing: Secondary | ICD-10-CM

## 2020-03-14 NOTE — Progress Notes (Signed)
NEUROLOGY CONSULTATION NOTE  Monica Watson MRN: [id number] DOB: 1956-02-04  Referring provider: Sharion Settler, DO Primary care provider: Sharion Settler, DO  Reason for consult:  Bilateral leg shaking  HISTORY OF PRESENT ILLNESS: Monica Watson is a 64 year old right-handed female with HTN, type 2 diabetes and history of bilateral knee replacement who presents for shaking in the legs.  History supplemented by primary care notes.  She started having shaking in both legs since a mild stroke in 2011.  She reports some residual left sided weakness from that stroke.  When she stands up, her legs start to shake.  It does stop.  It does not occur when sitting or laying down.  She denies tremor in the hands.  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  Hgb A1c from  June was 6.1. BMP from July normal except for elevated glucose of 146.  TSH from last year was 0.594.  She takes gabapentin 800mg  three times daily for neuralgia.    PAST MEDICAL HISTORY: Past Medical History:  Diagnosis Date  . Cerebrovascular disease   . Hypertension   . Type 2 diabetes mellitus ([place])     PAST SURGICAL HISTORY: Past Surgical History:  Procedure Laterality Date  . THYROIDECTOMY      MEDICATIONS: Current Outpatient Medications on File Prior to Visit  Medication Sig Dispense Refill  . atorvastatin (LIPITOR) 20 MG tablet TAKE 1 TABLET(20 MG) BY MOUTH DAILY 60 tablet 0  . FEROSUL 325 (65 Fe) MG tablet TAKE 1 TABLET BY MOUTH DAILY 30 tablet 3  . gabapentin (NEURONTIN) 800 MG tablet Take 800 mg by mouth 3 (three) times daily.    Marland Kitchen losartan (COZAAR) 50 MG tablet TAKE 1 TABLET BY MOUTH ONCE DAILY FOR BLOOD PRESSURE 90 tablet 0  . metFORMIN (GLUCOPHAGE-XR) 500 MG 24 hr tablet TAKE 2 TABLETS BY MOUTH QD WITH EVENING MEAL FOR DIABETES 90 tablet 0  . oxyCODONE-acetaminophen (PERCOCET) 10-325 MG tablet TAKE 1 TABLET BY MOUTH  4 TIMES DAILY AS NEEDED    . PREMARIN vaginal cream INSERT 1 APPLICATORFUL VAGINALLY DAILY 30 g 6  . promethazine (PHENERGAN) 12.5 MG tablet Take 1 tablet (12.5 mg total) by mouth every 8 (eight) hours as needed for nausea or vomiting. (Patient not taking: Reported on 01/02/2020) 30 tablet 0   No current facility-administered medications on file prior to visit.    ALLERGIES: No Known Allergies  FAMILY HISTORY: History reviewed. No pertinent family history.  SOCIAL HISTORY: Social History   Socioeconomic History  . Marital status: Single    Spouse name: Not on file  . Number of children: Not on file  . Years of education: Not on file  . Highest education level: Not on file  Occupational History  . Not on file  Tobacco Use  . Smoking status: Former Research scientist (life sciences)  . Smokeless tobacco: Never Used  Substance and Sexual Activity  . Alcohol use: Never  . Drug use: Never  . Sexual activity: Not on file  Other Topics Concern  . Not on file  Social History Narrative   Retired    Investment banker, operational of Radio broadcast assistant Strain:   . Difficulty of Paying Living Expenses: Not on file  Food Insecurity:   . Worried About Charity fundraiser in the Last Year: Not on file  . Ran Out of Food in the Last Year: Not on file  Transportation Needs:   .  Lack of Transportation (Medical): Not on file  . Lack of Transportation (Non-Medical): Not on file  Physical Activity:   . Days of Exercise per Week: Not on file  . Minutes of Exercise per Session: Not on file  Stress:   . Feeling of Stress : Not on file  Social Connections:   . Frequency of Communication with Friends and Family: Not on file  . Frequency of Social Gatherings with Friends and Family: Not on file  . Attends Religious Services: Not on file  . Active Member of Clubs or Organizations: Not on file  . Attends Archivist Meetings: Not on file  . Marital Status: Not on file  Intimate Partner Violence:   . Fear of  Current or Ex-Partner: Not on file  . Emotionally Abused: Not on file  . Physically Abused: Not on file  . Sexually Abused: Not on file    PHYSICAL EXAM: Blood pressure 106/75, pulse 76, height 5\' 5"  (1.651 m), weight 207 lb 9.6 oz (94.2 kg), SpO2 97 %. General: No acute distress.  Patient appears well-groomed.  Head:  Normocephalic/atraumatic Eyes:  fundi examined but not visualized Neck: supple, no paraspinal tenderness, full range of motion Back: No paraspinal tenderness Heart: regular rate and rhythm Lungs: Clear to auscultation bilaterally. Vascular: No carotid bruits. Neurological Exam: Mental status: alert and oriented to person, place, and time, recent and remote memory intact, fund of knowledge intact, attention and concentration intact, speech fluent and not dysarthric, language intact. Cranial nerves: CN I: not tested CN II: pupils equal, round and reactive to light, visual fields intact CN III, IV, VI:  full range of motion, no nystagmus, no ptosis CN V: facial sensation intact CN VII: upper and lower face symmetric CN VIII: hearing intact CN IX, X: gag intact, uvula midline CN XI: sternocleidomastoid and trapezius muscles intact CN XII: tongue midline Bulk & Tone: normal, no fasciculations. Motor:  5/5 throughout  Sensation:  Pinprick and vibration sensation reduced in left foot.  Otherwise, intact. Deep Tendon Reflexes:  1+ throughout, toes downgoing.   Finger to nose testing:  Without dysmetria.   Gait:  Upon standing, she exhibited shaking in the legs for about 5 seconds.  Antalgic gait.  Romberg negative.  IMPRESSION: Orthostatic tremor.  She already is taking gabapentin, which typically is effective.  PLAN: 1.  Start clonazepam 0.25mg  twice daily.  Cautioned for drowsiness.  We can increase dose in 4 weeks if needed. 2.  Follow up in 4 to 6 months.  Thank you for allowing me to take part in the care of this patient.  Metta Clines, DO  CC: Sharion Settler, DO

## 2020-03-15 ENCOUNTER — Ambulatory Visit (INDEPENDENT_AMBULATORY_CARE_PROVIDER_SITE_OTHER): Payer: Medicaid Other | Admitting: Neurology

## 2020-03-15 ENCOUNTER — Other Ambulatory Visit: Payer: Self-pay

## 2020-03-15 ENCOUNTER — Encounter: Payer: Self-pay | Admitting: Neurology

## 2020-03-15 VITALS — BP 106/75 | HR 76 | Ht 65.0 in | Wt 207.6 lb

## 2020-03-15 DIAGNOSIS — G252 Other specified forms of tremor: Secondary | ICD-10-CM | POA: Diagnosis not present

## 2020-03-15 MED ORDER — CLONAZEPAM 0.5 MG PO TABS: 0.2500 mg | ORAL_TABLET | Freq: Two times a day (BID) | ORAL | 3 refills | Status: DC

## 2020-03-15 NOTE — Patient Instructions (Signed)
1.  Take clonazepam 5mg  tablet:  Take 1/2 tablet twice daily.  We can increase dose in 4 weeks if needed.  Caution for drowsiness. 2.  Follow up in 4 to 6 months.

## 2020-04-10 ENCOUNTER — Other Ambulatory Visit: Payer: Self-pay

## 2020-04-10 ENCOUNTER — Encounter (HOSPITAL_COMMUNITY): Payer: Self-pay | Admitting: Emergency Medicine

## 2020-04-10 ENCOUNTER — Ambulatory Visit (HOSPITAL_COMMUNITY)
Admission: EM | Admit: 2020-04-10 | Discharge: 2020-04-10 | Disposition: A | Discharge status: Home / Self Care | Payer: Medicaid Other | Attending: Emergency Medicine | Admitting: Emergency Medicine

## 2020-04-10 DIAGNOSIS — Z20822 Contact with and (suspected) exposure to covid-19: Secondary | ICD-10-CM | POA: Insufficient documentation

## 2020-04-10 DIAGNOSIS — R109 Unspecified abdominal pain: Secondary | ICD-10-CM | POA: Diagnosis not present

## 2020-04-10 DIAGNOSIS — Z7984 Long term (current) use of oral hypoglycemic drugs: Secondary | ICD-10-CM | POA: Diagnosis not present

## 2020-04-10 DIAGNOSIS — M25562 Pain in left knee: Secondary | ICD-10-CM | POA: Diagnosis not present

## 2020-04-10 DIAGNOSIS — I679 Cerebrovascular disease, unspecified: Secondary | ICD-10-CM | POA: Insufficient documentation

## 2020-04-10 DIAGNOSIS — R197 Diarrhea, unspecified: Secondary | ICD-10-CM | POA: Diagnosis not present

## 2020-04-10 DIAGNOSIS — R112 Nausea with vomiting, unspecified: Secondary | ICD-10-CM | POA: Diagnosis not present

## 2020-04-10 DIAGNOSIS — M25561 Pain in right knee: Secondary | ICD-10-CM | POA: Insufficient documentation

## 2020-04-10 DIAGNOSIS — Z79899 Other long term (current) drug therapy: Secondary | ICD-10-CM | POA: Insufficient documentation

## 2020-04-10 DIAGNOSIS — Z87891 Personal history of nicotine dependence: Secondary | ICD-10-CM | POA: Diagnosis not present

## 2020-04-10 DIAGNOSIS — Z8673 Personal history of transient ischemic attack (TIA), and cerebral infarction without residual deficits: Secondary | ICD-10-CM | POA: Diagnosis not present

## 2020-04-10 DIAGNOSIS — E1159 Type 2 diabetes mellitus with other circulatory complications: Secondary | ICD-10-CM | POA: Insufficient documentation

## 2020-04-10 DIAGNOSIS — I10 Essential (primary) hypertension: Secondary | ICD-10-CM | POA: Diagnosis not present

## 2020-04-10 DIAGNOSIS — G8929 Other chronic pain: Secondary | ICD-10-CM | POA: Insufficient documentation

## 2020-04-10 MED ORDER — ONDANSETRON 4 MG PO TBDP: 4.0000 mg | ORAL_TABLET | Freq: Three times a day (TID) | ORAL | 0 refills | Status: DC | PRN

## 2020-04-10 NOTE — ED Triage Notes (Signed)
Pt presents with nausea, v, and d, body aches xs 2 days.

## 2020-04-10 NOTE — Discharge Instructions (Signed)
COVID test pending Zofran for nausea Drink plenty of fluids Follow up if not improving or worsening, developing increased pain

## 2020-04-11 LAB — SARS CORONAVIRUS 2 (TAT 6-24 HRS): SARS Coronavirus 2: NEGATIVE

## 2020-04-11 NOTE — ED Provider Notes (Signed)
Monica Watson    CSN: [id number] Arrival date & time: 04/10/20  1721      History   Chief Complaint No chief complaint on file. Nausea, Vomiting, diarrhea  HPI Monica Watson is a 64 y.o. female history of prior stroke, hypertension, DM type II, presenting today for evaluation of nausea vomiting and diarrhea.  Patient reports over the past 1 to 2 days she has developed nausea vomiting diarrhea, associated abdominal discomfort as well as body aches.  She is concerned about possible Covid and would like to be tested.  She is vaccinated.  She denies any URI symptoms of cough congestion or sore throat.  Ports abdominal pain is crampy and comes and goes with nausea/diarrhea.  Denies blood in stool or vomit.  Symptoms slightly improved today.  Denies close sick contacts.  Has had prior cholecystectomy  HPI  Past Medical History:  Diagnosis Date  . Cerebrovascular disease   . Hypertension   . Type 2 diabetes mellitus [facility])     Patient Active Problem List   Diagnosis Date Noted  . Vision changes 12/31/2019  . Bilateral knee pain 10/08/2019  . Poor dentition 10/08/2019  . Chronic left shoulder pain 08/31/2019  . Hemorrhoids 04/04/2019  . History of stroke 01/01/2019  . Hypertension associated with diabetes ([place]) 01/01/2019  . Pre-diabetes 01/01/2019  . Hot flashes 01/01/2019  . Onychomycosis 01/01/2019    Past Surgical History:  Procedure Laterality Date  . THYROIDECTOMY      OB History   No obstetric history on file.      Home Medications    Prior to Admission medications   Medication Sig Start Date End Date Taking? Authorizing Provider  atorvastatin (LIPITOR) 20 MG tablet TAKE 1 TABLET(20 MG) BY MOUTH DAILY 01/01/20   Sharion Settler, DO  clonazePAM (KLONOPIN) 0.5 MG tablet Take 0.5 tablets (0.25 mg total) by mouth 2 (two) times daily. 03/15/20   Tomi Likens, Adam R, DO  FEROSUL 325 (65 Fe) MG tablet TAKE 1 TABLET BY MOUTH DAILY 01/21/20   Sharion Settler, DO   gabapentin (NEURONTIN) 800 MG tablet Take 800 mg by mouth 3 (three) times daily. 11/25/18   [provider]  losartan (COZAAR) 50 MG tablet TAKE 1 TABLET BY MOUTH ONCE DAILY FOR BLOOD PRESSURE 01/07/20   Martyn Malay, MD  metFORMIN (GLUCOPHAGE-XR) 500 MG 24 hr tablet TAKE 2 TABLETS BY MOUTH QD WITH EVENING MEAL FOR DIABETES 01/07/20   Martyn Malay, MD  ondansetron (ZOFRAN ODT) 4 MG disintegrating tablet Take 1 tablet (4 mg total) by mouth every 8 (eight) hours as needed for nausea or vomiting. 04/10/20   Jaryn Rosko C, PA-C  oxyCODONE-acetaminophen (PERCOCET) 10-325 MG tablet TAKE 1 TABLET BY MOUTH 4 TIMES DAILY AS NEEDED 12/09/18   [provider]  PREMARIN vaginal cream INSERT 1 APPLICATORFUL VAGINALLY DAILY 02/15/20   Sharion Settler, DO  promethazine (PHENERGAN) 12.5 MG tablet Take 1 tablet (12.5 mg total) by mouth every 8 (eight) hours as needed for nausea or vomiting. 10/15/19   Shirley, [place], DO  tiZANidine (ZANAFLEX) 2 MG tablet Take 2 mg by mouth 2 (two) times daily as needed. 03/09/20   [provider]    Family History History reviewed. No pertinent family history.  Social History Social History   Tobacco Use  . Smoking status: Former Research scientist (life sciences)  . Smokeless tobacco: Never Used  Substance Use Topics  . Alcohol use: Never  . Drug use: Never     Allergies  Patient has no known allergies.   Review of Systems Review of Systems  Constitutional: Negative for activity change, appetite change, chills, fatigue and fever.  HENT: Negative for congestion, ear pain, rhinorrhea, sinus pressure, sore throat and trouble swallowing.   Eyes: Negative for discharge and redness.  Respiratory: Negative for cough, chest tightness and shortness of breath.   Cardiovascular: Negative for chest pain.  Gastrointestinal: Positive for abdominal pain, diarrhea, nausea and vomiting.  Musculoskeletal: Negative for myalgias.  Skin: Negative for rash.  Neurological:  Negative for dizziness, light-headedness and headaches.     Physical Exam Triage Vital Signs ED Triage Vitals  Enc Vitals Group     BP 04/10/20 1838 (!) 153/105     Pulse Rate 04/10/20 1838 (!) 102     Resp 04/10/20 1838 20     Temp 04/10/20 1838 99.2 F (37.3 C)     Temp Source 04/10/20 1838 Oral     SpO2 04/10/20 1838 96 %     Weight --      Height --      Head Circumference --      Peak Flow --      Pain Score 04/10/20 1837 9     Pain Loc --      Pain Edu? --      Excl. in [place]? --    No data found.  Updated Vital Signs BP (!) 153/105 (BP Location: Left Arm)   Pulse (!) 102   Temp 99.2 F (37.3 C) (Oral)   Resp 20   SpO2 96%   Visual Acuity Right Eye Distance:   Left Eye Distance:   Bilateral Distance:    Right Eye Near:   Left Eye Near:    Bilateral Near:     Physical Exam Vitals and nursing note reviewed.  Constitutional:      Appearance: She is well-developed.     Comments: No acute distress  HENT:     Head: Normocephalic and atraumatic.     Nose: Nose normal.     Mouth/Throat:     Comments: Oral mucosa pink and moist, no tonsillar enlargement or exudate. Posterior pharynx patent and nonerythematous, no uvula deviation or swelling. Normal phonation.  Eyes:     Conjunctiva/sclera: Conjunctivae normal.  Cardiovascular:     Rate and Rhythm: Normal rate.  Pulmonary:     Effort: Pulmonary effort is normal. No respiratory distress.     Comments: Breathing comfortably at rest, CTABL, no wheezing, rales or other adventitious sounds auscultated  Abdominal:     General: There is no distension.     Comments: Soft, nondistended, nontender to light and deep palpation throughout abdomen  Musculoskeletal:        General: Normal range of motion.     Cervical back: Neck supple.  Skin:    General: Skin is warm and dry.  Neurological:     Mental Status: She is alert and oriented to person, place, and time.      UC Treatments / Results  Labs (all labs  ordered are listed, but only abnormal results are displayed) Labs Reviewed  SARS CORONAVIRUS 2 (TAT 6-24 HRS)    EKG   Radiology No results found.  Procedures Procedures (including critical care time)  Medications Ordered in UC Medications - No data to display  Initial Impression / Assessment and Plan / UC Course  I have reviewed the triage vital signs and the nursing notes.  Pertinent labs & imaging results that were available during  my care of the patient were reviewed by me and considered in my medical decision making (see chart for details).     Covid test pending, suspect likely viral gastroenteritis recommending symptomatic and supportive care rest and fluids with continued monitoring.  Negative peritoneal signs on exam today.  Discussed strict return precautions. Patient verbalized understanding and is agreeable with plan.  Final Clinical Impressions(s) / UC Diagnoses   Final diagnoses:  Non-intractable vomiting with nausea, unspecified vomiting type  Diarrhea, unspecified type     Discharge Instructions     COVID test pending Zofran for nausea Drink plenty of fluids Follow up if not improving or worsening, developing increased pain    ED Prescriptions    Medication Sig Dispense Auth. Provider   ondansetron (ZOFRAN ODT) 4 MG disintegrating tablet  (Status: Discontinued) Take 1 tablet (4 mg total) by mouth every 8 (eight) hours as needed for nausea or vomiting. 20 tablet Rylin Seavey C, PA-C   ondansetron (ZOFRAN ODT) 4 MG disintegrating tablet Take 1 tablet (4 mg total) by mouth every 8 (eight) hours as needed for nausea or vomiting. 20 tablet Aymee Fomby, [place] C, PA-C     PDMP not reviewed this encounter.   Autie Vasudevan, [place] C, PA-C 04/11/20 1026

## 2020-04-18 ENCOUNTER — Other Ambulatory Visit: Payer: Self-pay

## 2020-04-18 MED ORDER — LOSARTAN POTASSIUM 50 MG PO TABS
ORAL_TABLET | ORAL | 0 refills | Status: DC
Start: 2020-04-18 — End: 2020-07-13

## 2020-04-28 ENCOUNTER — Ambulatory Visit: Payer: Medicaid Other | Admitting: Family Medicine

## 2020-05-04 ENCOUNTER — Ambulatory Visit: Payer: Medicaid Other | Admitting: Family Medicine

## 2020-05-09 ENCOUNTER — Telehealth: Payer: Self-pay

## 2020-05-09 NOTE — Telephone Encounter (Signed)
Patient calls nurse line requesting transportation to 11/18 apt at [facility]. Patient reports the lady who usually drives her does not have any gas money this week. Patient states she has been calling transportation services, however has been placed on hold "twice." Advised patient to keep trying transportation and I will forward to [place] on our end.

## 2020-05-11 ENCOUNTER — Ambulatory Visit: Payer: Medicaid Other | Admitting: Orthopaedic Surgery

## 2020-05-12 ENCOUNTER — Ambulatory Visit (INDEPENDENT_AMBULATORY_CARE_PROVIDER_SITE_OTHER): Payer: Medicaid Other | Admitting: Family Medicine

## 2020-05-12 ENCOUNTER — Other Ambulatory Visit: Payer: Self-pay

## 2020-05-12 VITALS — BP 115/80 | HR 83 | Ht 65.0 in | Wt 202.4 lb

## 2020-05-12 DIAGNOSIS — Z23 Encounter for immunization: Secondary | ICD-10-CM | POA: Diagnosis not present

## 2020-05-12 DIAGNOSIS — Z8673 Personal history of transient ischemic attack (TIA), and cerebral infarction without residual deficits: Secondary | ICD-10-CM | POA: Diagnosis present

## 2020-05-12 DIAGNOSIS — F320 Major depressive disorder, single episode, mild: Secondary | ICD-10-CM

## 2020-05-12 MED ORDER — SERTRALINE HCL 50 MG PO TABS: 25.0000 mg | ORAL_TABLET | Freq: Every day | ORAL | 0 refills | Status: DC

## 2020-05-12 NOTE — Assessment & Plan Note (Signed)
Patient continues to feel generally weak since her stroke in 2010.  She has been unable to receive home health physical therapy due to insurance issues.  Patient would qualify for outpatient physical therapy and speech therapy for improvement in strength and speech.  She will be able to get transportation through [facility].  Additionally feel that she will benefit from getting out of her house for these appointments.  Referral sent for outpatient physical therapy and speech therapy.

## 2020-05-12 NOTE — Patient Instructions (Addendum)
It was wonderful to see you today!  Please bring ALL of your medications with you to every visit.   Today we talked about:  Your overall weakness and speech difficulties. I will send a referral for Physical Therapy and Speech.   I will start you on Zoloft for your depression. Take 25 mg (1/2 a tablet) for 1 week, if you are doing well with this and not experiencing any side effects, you can increase to 50 mg (1 tablet) daily. Please return in 2-weeks. I have attached resources for therapy as well.   Thank you for choosing [facility].   Please call [phone] with any questions about today's appointment.  Please be sure to schedule follow up at the front  desk before you leave today.   Sharion Settler, DO PGY-1 Family Medicine     Therapy and Counseling Resources Most providers on this list will take Medicaid. Patients with commercial insurance or Medicare should contact their insurance company to get a list of in network providers.  [facility]:Psychiatry and Counseling [address] [facility] [address] [place], [place] [address] [place]  [address] [place], [place] [address]      [place] [address]  [place], [place] [address] [phone]  [place] [address] [place]  [place], [place] [address]       [phone]      Jinny Blossom [facility] [address] [place], [place]  [facility]:  [place]. [place] [phone]  [facility]:  [address] [phone]  [facility] (under & uninsured) [address] [place] [phone]    [email or web address]    [place] [address] . [place]    [phone]  [facility] [place]     Phone:   [phone]     [place]  [place] [address]      [place], [place] ext [place]: [place], [place], [place]   [place] ([place] therapist) [email or web address]  [place] [address]   [place] [address]    [phone]    [facility]   [address],  [place], [place]   [place]  [place]  [facility]  [address] [place], [place]        [phone]  Open Access/Walk In Clinic under & uninsured  [facility]  [address] [place], [place] [facility] [phone]  [facility] [place],  ([place])   [place], [place]: [phone]) 8:30 - 12; 1 - 2:30  [facility] of the [place],  [place], [place]    ([phone]):8:30 - 12; 2 - 3PM  [facility] [employer],  [address],  [place]; [phone]):   Mon - Fri 8 AM - 5 PM  [facility] [place]  MWF 12:30 to 3:00 or call to schedule an appointment  [phone]  Specific Provider options Psychology Today  [email or web address] 1. click on find a therapist  2. enter  your zip code 3. left side and select or tailor a therapist for your specific need.   [facility] Provider Directory [email or web address]  (Medicaid)   Follow all drop down to find a provider  [place] [phone] or [email or web address] [address], [place], [place] Recovery support and educational   24- Hour Availability:  .  [facility]  . [place], [place] [facility] [phone]  . Family Service of the [employer]  [phone]  [facility] Crisis Service  [phone]   . [place]  [phone] (after hours)  . Therapeutic Alternative/Mobile Crisis   [phone]  . [place] National Suicide Hotline  (564) 399-8030 ([place])  . Call 911 or go to emergency room  . [employer]  [phone]);  [place] and [employer]   . [facility] ACCESS  [phone]); [place], [place], [place], [place], [place], [place], [place]

## 2020-05-12 NOTE — Progress Notes (Signed)
SUBJECTIVE:   CHIEF COMPLAINT / HPI:   Depressed, Stressed States she has felt depressed for the last 4-5 months. She recently moved to a new apartment and lives alone. She feels lonely.  States that she needs to take 12 steps to get to her apartment daily.  This is hard for her.she will be moving to a new apartment in December where she will only have to go up 2 stairs.  She is looking forward to this move.  Additionally she tells me that this is the first time she has lived alone. She feels this is contributing to her depression. States that she cries randomly. She is not interested in therapy at this time. Denies SI. Is interested in trying medication for this and also feels she will feel better after the move.   Residual Stroke Symptoms She has continued numbness in left hand and foot.  Additionally has left-sided headaches and weakness on left-side.  These have been apparent since her stroke.  She would like to go to physical therapy for this. Is also interested in a referral for speech therapy as she feels that her speech is slurred and sometimes is unable to get words out/sometimes says the wrong thing.  She uses a cane to walk.  Has bilateral foot shakiness.  Denies any recent falls.  PERTINENT  PMH / PSH:  Past Medical History:  Diagnosis Date   Cerebrovascular disease    Hypertension    Type 2 diabetes mellitus ([place])      OBJECTIVE:   BP 115/80    Pulse 83    Ht 5\' 5"  (1.651 m)    Wt 202 lb 6.4 oz (91.8 kg)    SpO2 97%    BMI 33.68 kg/m   Gen: alert, pleasant, occasionally tearful, in no acute distress Resp: CTAB, no wheezing/rhonchi/rales Cardio: RRR, no murmurs Neuro: CN2-12 intact without deficits, speech is slightly slurred and has occasional trouble with word finding, 5/5 strength with arm flexion and extension. 4/5 strength with finger abduction in left hand, 4/5 strength with left side leg flexion,  normal gait with cane but feet shake bilaterally with standing    Depression screen [facility] 2/9 05/12/2020 12/31/2019 12/31/2019  Decreased Interest 1 1 1   Down, Depressed, Hopeless 1 2 2   PHQ - 2 Score 2 3 3   Altered sleeping 0 1 -  Tired, decreased energy 1 2 -  Change in appetite 1 0 -  Feeling bad or failure about yourself  0 0 -  Trouble concentrating 2 0 -  Moving slowly or fidgety/restless 2 1 -  Suicidal thoughts 0 0 -  PHQ-9 Score 8 7 -  Difficult doing work/chores Somewhat difficult - -       ASSESSMENT/PLAN:   Depression, major, single episode, mild (HCC) Patient has been feeling down for the last 4 to 5 months.  She scored an 8 on her PHQ-9.  Has no suicidal ideations.  She is interested in trying medication for her depression.  She is not interested in therapy at this time though did provide resources in her AVS.  Depression is likely related to feeling lonely as she lives alone and is unable to get out of the house much.  Her MDQ questionnaire was negative.  Will start patient on sertraline 25 mg, advised her to increase to 50 mg after a week if she is not feeling side effects.  We will have her follow-up in 2 weeks.  She was counseled on the  side effects of sertraline including GI upset and suicidal ideations.  She was advised to stop taking the medication if she experiences these unwanted thoughts.  History of stroke Patient continues to feel generally weak since her stroke in 2010.  She has been unable to receive home health physical therapy due to insurance issues.  Patient would qualify for outpatient physical therapy and speech therapy for improvement in strength and speech.  She will be able to get [facility].  Additionally feel that she will benefit from getting out of her house for these appointments.  Referral sent for outpatient physical therapy and speech therapy.     Health Maintenance She received her Flu vaccine today.   Sharion Settler, [place]

## 2020-05-12 NOTE — Assessment & Plan Note (Addendum)
Patient has been feeling down for the last 4 to 5 months.  She scored an 8 on her PHQ-9.  Has no suicidal ideations.  She is interested in trying medication for her depression.  She is not interested in therapy at this time though did provide resources in her AVS.  Depression is likely related to feeling lonely as she lives alone and is unable to get out of the house much.  Her MDQ questionnaire was negative.  Will start patient on sertraline 25 mg, advised her to increase to 50 mg after a week if she is not feeling side effects.  We will have her follow-up in 2 weeks.  She was counseled on the side effects of sertraline including GI upset and suicidal ideations.  She was advised to stop taking the medication if she experiences these unwanted thoughts.

## 2020-05-15 ENCOUNTER — Other Ambulatory Visit: Payer: Self-pay | Admitting: Family Medicine

## 2020-05-15 DIAGNOSIS — F320 Major depressive disorder, single episode, mild: Secondary | ICD-10-CM

## 2020-05-23 ENCOUNTER — Ambulatory Visit: Payer: Medicaid Other | Admitting: Orthopaedic Surgery

## 2020-05-25 ENCOUNTER — Other Ambulatory Visit: Payer: Self-pay | Admitting: Family Medicine

## 2020-05-25 DIAGNOSIS — F320 Major depressive disorder, single episode, mild: Secondary | ICD-10-CM

## 2020-05-25 MED ORDER — SERTRALINE HCL 50 MG PO TABS: 50.0000 mg | ORAL_TABLET | Freq: Every day | ORAL | 0 refills | Status: DC

## 2020-06-01 ENCOUNTER — Ambulatory Visit: Payer: Medicaid Other | Admitting: Orthopaedic Surgery

## 2020-06-09 ENCOUNTER — Ambulatory Visit (INDEPENDENT_AMBULATORY_CARE_PROVIDER_SITE_OTHER): Payer: Medicaid Other | Admitting: Family Medicine

## 2020-06-09 ENCOUNTER — Encounter: Payer: Self-pay | Admitting: Family Medicine

## 2020-06-09 ENCOUNTER — Other Ambulatory Visit: Payer: Self-pay

## 2020-06-09 VITALS — BP 110/62 | HR 85 | Temp 98.9°F | Ht 65.0 in | Wt 205.0 lb

## 2020-06-09 DIAGNOSIS — M791 Myalgia, unspecified site: Secondary | ICD-10-CM

## 2020-06-09 DIAGNOSIS — R7303 Prediabetes: Secondary | ICD-10-CM | POA: Diagnosis not present

## 2020-06-09 DIAGNOSIS — M549 Dorsalgia, unspecified: Secondary | ICD-10-CM | POA: Insufficient documentation

## 2020-06-09 DIAGNOSIS — E611 Iron deficiency: Secondary | ICD-10-CM

## 2020-06-09 DIAGNOSIS — M546 Pain in thoracic spine: Secondary | ICD-10-CM

## 2020-06-09 DIAGNOSIS — G8929 Other chronic pain: Secondary | ICD-10-CM | POA: Insufficient documentation

## 2020-06-09 LAB — POCT GLYCOSYLATED HEMOGLOBIN (HGB A1C): Hemoglobin A1C: 6.4 % — AB (ref 4.0–5.6)

## 2020-06-09 MED ORDER — FERROUS SULFATE 325 (65 FE) MG PO TABS: 325.0000 mg | ORAL_TABLET | Freq: Every day | ORAL | 3 refills | Status: DC

## 2020-06-09 MED ORDER — TIZANIDINE HCL 2 MG PO TABS: 2.0000 mg | ORAL_TABLET | Freq: Two times a day (BID) | ORAL | 0 refills | Status: DC | PRN

## 2020-06-09 NOTE — Patient Instructions (Addendum)
It was wonderful to see you today!  Please bring ALL of your medications with you to every visit.   Today we talked about:  Your right-sided back pain.  Alternate Tylenol and Ibuprofen for this pain. Take Ibuprofen with food. You can also use heat or ice to the area, whichever feels best. If this does not help, or if the symptoms get worse then please make an appointment to come see me again.    Thank you for choosing [facility].   Please call [phone] with any questions about today's appointment.  Please be sure to schedule follow up at the front  desk before you leave today.   Sharion Settler, DO PGY-1 Family Medicine

## 2020-06-09 NOTE — Progress Notes (Signed)
    SUBJECTIVE:   CHIEF COMPLAINT / HPI:   Rib pain Patient states that about a week ago she had a "stomach virus".  States at that time she vomited once, was having diarrhea and some abdominal pain.  The symptoms have since resolved but now she has right-sided "rib pain".  Is wondering if she needs x-rays for this.  Describes the pain as pressure, " but not too much".  It is intermittent and exacerbated with lying on her right side.  It is improved with her Neurontin and her arthritis cream.  It does not awaken her from sleep.  Reports no incontinence or saddle anesthesia.  No trauma or injury to the area.  No pain with breathing or cough.  PERTINENT  PMH / PSH:  Past Medical History:  Diagnosis Date  . Cerebrovascular disease   . Hypertension   . Type 2 diabetes mellitus (HCC)      OBJECTIVE:   BP 110/62 (BP Location: Right Arm, Patient Position: Sitting, Cuff Size: Large)   Pulse 85   Temp 98.9 F (37.2 C) (Oral)   Ht 5\' 5"  (1.651 m)   Wt 205 lb (93 kg)   SpO2 99%   BMI 34.11 kg/m    General: NAD, pleasant, able to participate in exam Cardiac: RRR, no murmurs. Respiratory: CTAB, normal effort, No wheezes, rales or rhonchi MSK: Tenderness to right-side of back on palpation, around ribs 9-10. No midline tenderness. Full active ROM of back.   Skin: warm and dry, no rashes noted Neuro: alert, no obvious focal deficits Psych: Normal affect and mood  ASSESSMENT/PLAN:   Acute right-sided back pain Patient with a 1-week history of right-sided back pain.  No history of previous trauma or fall to the area.  Of note, this started after a possible gastroenteritis.  Low suspicion for any acute fracture due to the history.  Pain on palpation of her right back, around ribs 9-10.  No bruise or rash to the area. Suspect that this is more related to muscle spasm. Patient has not tried any medications or therapies for this.  No red flag symptoms.  Will trial with conservative treatment  including Tylenol and ibuprofen, heat/ice and will refill her previous prescription of tizanidine.  Do not feel that imaging is indicated at this time.  Patient will return if symptoms worsen or if no improvement with conservative management.  Pre-diabetes A1c 6.4 today.  Patient is currently on Metformin and Losartan. Repeat screening in 12 months.     Sharion Settler, DO [facility]    This note was prepared using Dragon voice recognition software and may include unintentional dictation errors due to the inherent limitations of voice recognition software.

## 2020-06-09 NOTE — Assessment & Plan Note (Addendum)
A1c 6.4 today.  Patient is currently on Metformin and Losartan. Repeat screening in 12 months.

## 2020-06-09 NOTE — Assessment & Plan Note (Signed)
Patient with a 1-week history of right-sided back pain.  No history of previous trauma or fall to the area.  Of note, this started after a possible gastroenteritis.  Low suspicion for any acute fracture due to the history.  Pain on palpation of her right back, around ribs 9-10.  No bruise or rash to the area. Suspect that this is more related to muscle spasm. Patient has not tried any medications or therapies for this.  No red flag symptoms.  Will trial with conservative treatment including Tylenol and ibuprofen, heat/ice and will refill her previous prescription of tizanidine.  Do not feel that imaging is indicated at this time.  Patient will return if symptoms worsen or if no improvement with conservative management.

## 2020-06-11 ENCOUNTER — Other Ambulatory Visit: Payer: Self-pay | Admitting: Family Medicine

## 2020-06-11 DIAGNOSIS — M791 Myalgia, unspecified site: Secondary | ICD-10-CM

## 2020-06-13 ENCOUNTER — Ambulatory Visit (INDEPENDENT_AMBULATORY_CARE_PROVIDER_SITE_OTHER): Payer: Medicaid Other | Admitting: Orthopaedic Surgery

## 2020-06-13 ENCOUNTER — Encounter: Payer: Self-pay | Admitting: Orthopaedic Surgery

## 2020-06-13 VITALS — Ht 65.0 in | Wt 205.0 lb

## 2020-06-13 DIAGNOSIS — Z96651 Presence of right artificial knee joint: Secondary | ICD-10-CM | POA: Insufficient documentation

## 2020-06-13 DIAGNOSIS — Z96652 Presence of left artificial knee joint: Secondary | ICD-10-CM | POA: Diagnosis not present

## 2020-06-13 DIAGNOSIS — M25562 Pain in left knee: Secondary | ICD-10-CM | POA: Diagnosis not present

## 2020-06-13 DIAGNOSIS — M25561 Pain in right knee: Secondary | ICD-10-CM

## 2020-06-13 DIAGNOSIS — G8929 Other chronic pain: Secondary | ICD-10-CM

## 2020-06-13 NOTE — Progress Notes (Signed)
The patient is someone of seen before.  She has a history of both her knees being replaced.  [place]  The right knee was replaced in 2017 and the left knee was replaced around 2009 or 2010.  Both knees on x-ray show press-fit implants.  She is a diabetic but her hemoglobin A1c just this month was only 6.4.  She ambulates with a cane due to history of a stroke.  She says the right knee swells on occasion and pops more so.  The left knee does not swell but it hurts.  She is on chronic narcotic pain medication.  On my exam again of both knees, neither knee has any effusion today.  There is a little bit more play in the right knee and really no instability that I can feel the left knee.  There is no warmth to the left knee either.  Previous x-rays from earlier this year show bilateral press-fit knee arthroplasties with 2 different companies.  I would like to have one of my partners Dr. Marlou Sa see her as a second opinion.  I am trying to get a better idea of whether or not I would just recommend a polyliner exchange eventually for both knees.  Given the fact that she is in chronic pain and on chronic pain medication, I cannot guarantee that this would help significant with the pain since it is nonspecific global pain but my exam of the left knee is benign.  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  Dr. Marlou Sa can gently send her back to me once he sees her and is able to hopefully give it opinion as it relates to what route to take with her knees.

## 2020-06-29 ENCOUNTER — Ambulatory Visit (INDEPENDENT_AMBULATORY_CARE_PROVIDER_SITE_OTHER): Payer: Medicaid Other | Admitting: Orthopedic Surgery

## 2020-06-29 ENCOUNTER — Ambulatory Visit (INDEPENDENT_AMBULATORY_CARE_PROVIDER_SITE_OTHER): Payer: Medicaid Other

## 2020-06-29 DIAGNOSIS — M79604 Pain in right leg: Secondary | ICD-10-CM

## 2020-07-02 ENCOUNTER — Encounter: Payer: Self-pay | Admitting: Orthopedic Surgery

## 2020-07-02 NOTE — Progress Notes (Signed)
Office Visit Note   Patient: Monica Watson           Date of Birth: 02/18/1956           MRN: [id number] Visit Date: 06/29/2020 Requested by: Sharion Settler, DO [place],  [place] [address] PCP: Sharion Settler, DO  Subjective: Chief Complaint  Patient presents with  . Right Knee - Follow-up    HPI: Rubi is an ambulatory 65 year old patient with bilateral knee pain and lower extremity pain who presents for second opinion regarding her total knee replacements.  Patient describes pain in both knees with instability more prominent symptom on the right knee.  She reports pain radiating from the groin down to the knee on the right-hand side.  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  Stairs are difficult for the patient.  She does use a cane.  Takes gabapentin for her symptoms.  Denies any fevers or chills or swelling in either knee.  Bone scan done 6 months ago showed relative increased uptake on the medial side of the left knee otherwise unremarkable.  She does have a history of having a stroke in 2011 which affected her left hand side.              ROS: All systems reviewed are negative as they relate to the chief complaint within the history of present illness.  Patient denies  fevers or chills.   Assessment & Plan: Visit Diagnoses:  1. Pain in right leg     Plan: Impression is bilateral lower extremity pain with bilateral press-fit knees in good position and alignment.  The left knee has good stability no effusion and no evidence of loosening on plain radiographs.  Slight increased uptake on the medial tibial plateau on the left-hand side.  This knee replacement was placed 11 years ago.  Regarding the left knee I think it is unlikely she has a significant problem there which would warrant any type of intervention yet.  The cases of [place] where that I have seen which have given  loosening and bone loss typically are associated with effusion.  She does not have that today.  Radiographs also show the posterior cruciate retaining prosthesis to be in good position with no lucencies around the bone prosthetic interface.  I would favor observation for the left knee.  Regarding the right knee I do think she has flexion instability in this knee.  Components are well-seated with no effusion in the right knee.  I agree that it makes sense to consider polyethylene exchange for a bigger polyethylene on the right knee as she does have laxity in both flexion and extension..    This knee was placed in 2017.  In addition the patient has groin pain on the right-hand side more than the left-hand side.  Her hip examination is pretty benign with no discrete loss of range of motion but her symptoms could be at least partially from the hip or back as well.  To that end I have sent her for diagnostic and therapeutic right hip injection as well as MRI scan of the lumbar spine.  With all this information in hand she can be counseled regarding expectations for right knee polyethylene exchange for her symptoms.  Follow-Up Instructions: No follow-ups on file.   Orders:  Orders Placed This Encounter  Procedures  . XR HIP UNILAT W OR W/O  PELVIS 2-3 VIEWS RIGHT  . XR Lumbar Spine 2-3 Views  . MR Lumbar Spine w/o contrast  . Ambulatory referral to Physical Medicine Rehab   No orders of the defined types were placed in this encounter.     Procedures: No procedures performed   Clinical Data: No additional findings.  Objective: Vital Signs: There were no vitals taken for this visit.  Physical Exam:   Constitutional: Patient appears well-developed HEENT:  Head: Normocephalic Eyes:EOM are normal Neck: Normal range of motion Cardiovascular: Normal rate Pulmonary/chest: Effort normal Neurologic: Patient is alert Skin: Skin is warm Psychiatric: Patient has normal mood and affect    Ortho  Exam: Ortho exam demonstrates slightly antalgic gait to the right with a cane in the right hand.  Left knee has no effusion range of motion from 5-100 with good patellar tracking.  No focal joint line tenderness is present.  No warmth in that left knee is present.  No effusion in the left knee.  Good stability to varus and valgus stress at zero thirty and 90 degrees.  No groin pain or significant loss of range of motion on that left-hand side with internal ex rotation of the hip.  On the right-hand side patient has some laxity with varus and valgus stress at both zero thirty and to a lesser degree 90 degrees.  Extensor mechanism is intact.  No warmth or effusion in the right knee.  Pedal pulses trace palpable bilaterally.  No groin pain with internal extra rotation of that right leg.  With ambulation she does have some groin pain which radiates down the leg.  No definite paresthesias L1 S1 bilaterally.  No nerve root tension signs on either side.  Mild pain with forward and lateral bending and no trochanteric tenderness is present.  Specialty Comments:  No specialty comments available.  Imaging: No results found.   PMFS History: Patient Active Problem List   Diagnosis Date Noted  . History of total knee replacement, left 06/13/2020  . History of total knee arthroplasty, right 06/13/2020  . Acute right-sided back pain 06/09/2020  . Depression, major, single episode, mild ([place]) 05/12/2020  . Vision changes 12/31/2019  . Bilateral knee pain 10/08/2019  . Poor dentition 10/08/2019  . Chronic left shoulder pain 08/31/2019  . Hemorrhoids 04/04/2019  . History of stroke 01/01/2019  . Hypertension associated with diabetes ([place]) 01/01/2019  . Pre-diabetes 01/01/2019  . Hot flashes 01/01/2019  . Onychomycosis 01/01/2019   Past Medical History:  Diagnosis Date  . Cerebrovascular disease   . Hypertension   . Type 2 diabetes mellitus ([place])     History reviewed. No pertinent family history.   Past Surgical History:  Procedure Laterality Date  . THYROIDECTOMY     Social History   Occupational History  . Not on file  Tobacco Use  . Smoking status: Current Every Day Smoker  . Smokeless tobacco: Never Used  Substance and Sexual Activity  . Alcohol use: Never  . Drug use: Never  . Sexual activity: Not on file

## 2020-07-08 ENCOUNTER — Ambulatory Visit (INDEPENDENT_AMBULATORY_CARE_PROVIDER_SITE_OTHER): Payer: Medicaid Other | Admitting: Orthopedic Surgery

## 2020-07-08 DIAGNOSIS — M79604 Pain in right leg: Secondary | ICD-10-CM

## 2020-07-09 ENCOUNTER — Encounter: Payer: Self-pay | Admitting: Orthopedic Surgery

## 2020-07-09 NOTE — Progress Notes (Signed)
Office Visit Note   Patient: Monica Watson           Date of Birth: 1956/05/09           MRN: [id number] Visit Date: 07/08/2020 Requested by: Sharion Settler, DO [place],  [place] [address] PCP: Sharion Settler, DO  Subjective: Chief Complaint  Patient presents with  . Other    Follow up    HPI: Monica Watson is a patient here for follow-up.  She is doing about the same.  Did an extensive consultation on the patient which can be viewed in the prior note.  She has MRI scheduled for lumbar spine 07/10/2020 and right hip injection scheduled with Dr. Ernestina Patches for 127.  She is using a cane on the right-hand side.  Her main issue today is that she is moving to another place which does not require her to go up and down the stairs.  It is possible that she may choose to avoid any surgical intervention if she can take the stair component out of her day-to-day living.              ROS: All systems reviewed are negative as they relate to the chief complaint within the history of present illness.  Patient denies  fevers or chills.   Assessment & Plan: Visit Diagnoses:  1. Pain in right leg     Plan: Impression is bilateral lower extremity pain with likely some flexion instability in the right knee and possible lumbar degenerative changes giving her the rest of her symptoms.  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  I read her note today that the patient has a disability condition affecting her right leg and knee and would benefit from transfer to ground level apartment sooner rather than later.  She will follow-up with me as needed.  Follow-Up Instructions: No follow-ups on file.   Orders:  No orders of the defined types were placed in this encounter.  No orders of the defined types were placed in this encounter.     Procedures: No procedures performed   Clinical Data: No additional findings.  Objective: Vital Signs: There were no vitals taken  for this visit.  Physical Exam:   Constitutional: Patient appears well-developed HEENT:  Head: Normocephalic Eyes:EOM are normal Neck: Normal range of motion Cardiovascular: Normal rate Pulmonary/chest: Effort normal Neurologic: Patient is alert Skin: Skin is warm Psychiatric: Patient has normal mood and affect    Ortho Exam: Ortho exam demonstrates no effusion in either knee.  She has good stability on the left knee to varus and valgus stress at 0 and 30 degrees.  She has a little bit of laxity to valgus stress primarily in the right knee as well as some mild flexion instability as well.  Extensor mechanism is intact.  She has a little bit of restricted range of motion bilaterally in both hips to internal rotation with some groin pain on the right more than the left.  This is all unchanged from prior exam several weeks ago.  Specialty Comments:  No specialty comments available.  Imaging: No results found.   PMFS History: Patient Active Problem List   Diagnosis Date Noted  . History of total knee replacement, left 06/13/2020  . History of total knee arthroplasty, right 06/13/2020  . Acute right-sided back pain 06/09/2020  . Depression, major, single episode, mild ([place]) 05/12/2020  . Vision changes 12/31/2019  . Bilateral knee pain 10/08/2019  . Poor  dentition 10/08/2019  . Chronic left shoulder pain 08/31/2019  . Hemorrhoids 04/04/2019  . History of stroke 01/01/2019  . Hypertension associated with diabetes ([place]) 01/01/2019  . Pre-diabetes 01/01/2019  . Hot flashes 01/01/2019  . Onychomycosis 01/01/2019   Past Medical History:  Diagnosis Date  . Cerebrovascular disease   . Hypertension   . Type 2 diabetes mellitus (HCC)     No family history on file.  Past Surgical History:  Procedure Laterality Date  . THYROIDECTOMY     Social History   Occupational History  . Not on file  Tobacco Use  . Smoking status: Current Every Day Smoker  . Smokeless tobacco: Never  Used  Substance and Sexual Activity  . Alcohol use: Never  . Drug use: Never  . Sexual activity: Not on file

## 2020-07-10 ENCOUNTER — Other Ambulatory Visit: Payer: Medicaid Other

## 2020-07-13 ENCOUNTER — Other Ambulatory Visit: Payer: Self-pay | Admitting: Family Medicine

## 2020-07-21 ENCOUNTER — Other Ambulatory Visit: Payer: Self-pay

## 2020-07-21 ENCOUNTER — Ambulatory Visit: Payer: Self-pay

## 2020-07-21 ENCOUNTER — Ambulatory Visit (INDEPENDENT_AMBULATORY_CARE_PROVIDER_SITE_OTHER): Payer: Medicaid Other | Admitting: Physical Medicine and Rehabilitation

## 2020-07-21 ENCOUNTER — Encounter: Payer: Self-pay | Admitting: Physical Medicine and Rehabilitation

## 2020-07-21 DIAGNOSIS — M25551 Pain in right hip: Secondary | ICD-10-CM

## 2020-07-21 MED ORDER — TRIAMCINOLONE ACETONIDE 40 MG/ML IJ SUSP
60.0000 mg | INTRAMUSCULAR | Status: AC | PRN
  Administered 2020-07-21: 60 mg via INTRA_ARTICULAR

## 2020-07-21 MED ORDER — BUPIVACAINE HCL 0.25 % IJ SOLN
4.0000 mL | INTRAMUSCULAR | Status: AC | PRN
  Administered 2020-07-21: 4 mL via INTRA_ARTICULAR

## 2020-07-21 NOTE — Progress Notes (Signed)
   Numeric Pain Rating Scale and Functional Assessment Average Pain 7   In the last MONTH (on 0-10 scale) has pain interfered with the following?  1. General activity like being  able to carry out your everyday physical activities such as walking, climbing stairs, carrying groceries, or moving a chair?  Rating(10)  

## 2020-07-21 NOTE — Progress Notes (Signed)
   Erline Siddoway - 65 y.o. female MRN [id number]  Date of birth: 1955/08/21  Office Visit Note: Visit Date: 07/21/2020 PCP: Sharion Settler, DO Referred by: Sharion Settler, DO  Subjective: Chief Complaint  Patient presents with  . Left Knee - Pain   HPI:  Monica Watson is a 65 y.o. female who comes in today at the request of Dr. Anderson Malta for planned Right anesthetic hip arthrogram with fluoroscopic guidance.  The patient has failed conservative care including home exercise, medications, time and activity modification.  This injection will be diagnostic and hopefully therapeutic.  Please see requesting physician notes for further details and justification. Pain in right low back and lateral thigh and bilateral knees. She status post bilateral knee replacement.   ROS Otherwise per HPI.  Assessment & Plan: Visit Diagnoses:    ICD-10-CM   1. Pain in right hip  M25.551 XR C-ARM NO REPORT    Large Joint Inj: R hip joint    Plan: No additional findings.   Meds & Orders: No orders of the defined types were placed in this encounter.   Orders Placed This Encounter  Procedures  . Large Joint Inj: R hip joint  . XR C-ARM NO REPORT    Follow-up: Return for Anderson Malta, MD as scheduled.   Procedures: Large Joint Inj: R hip joint on 07/21/2020 10:36 AM Indications: pain and diagnostic evaluation Details: 22 G needle, anterior approach  Arthrogram: Yes  Medications: 4 mL bupivacaine 0.25 %; 60 mg triamcinolone acetonide 40 MG/ML Outcome: tolerated well, no immediate complications  Arthrogram demonstrated excellent flow of contrast throughout the joint surface without extravasation or obvious defect.  The patient had relief of symptoms during the anesthetic phase of the injection.  Procedure, treatment alternatives, risks and benefits explained, specific risks discussed. Consent was given by the patient. Immediately prior to procedure a time out was called to verify the  correct patient, procedure, equipment, support staff and site/side marked as required. Patient was prepped and draped in the usual sterile fashion.          Clinical History: No specialty comments available.     Objective:  VS:  HT:    WT:   BMI:     BP:   HR: bpm  TEMP: ( )  RESP:  Physical Exam   Imaging: No results found.

## 2020-07-22 ENCOUNTER — Ambulatory Visit (INDEPENDENT_AMBULATORY_CARE_PROVIDER_SITE_OTHER): Payer: Medicaid Other | Admitting: Orthopedic Surgery

## 2020-07-22 DIAGNOSIS — M79604 Pain in right leg: Secondary | ICD-10-CM

## 2020-07-23 ENCOUNTER — Encounter: Payer: Self-pay | Admitting: Orthopedic Surgery

## 2020-07-23 NOTE — Progress Notes (Signed)
Office Visit Note   Patient: Monica Watson           Date of Birth: [birth year]           MRN: [id number] Visit Date: 07/22/2020 Requested by: Sharion Settler, DO [place],  [place] [address] PCP: Sharion Settler, DO  Subjective: Chief Complaint  Patient presents with  . Follow-up    HPI: Monica Watson is a patient with bilateral lower extremity pain.  She has had 2 total knees done elsewhere.  Right total knee has some flexion laxity and may require some revision to a larger spacer.  Since I seen her she has had a right hip injection due to some arthritis in that right hip.  That gave her about 35% relief.  Still does hurt her up and down stairs localizing to the knee region.  Her groin pain is improved.  She has not had MRI scan of the lumbar spine yet due to the weather.              ROS: All systems reviewed are negative as they relate to the chief complaint within the history of present illness.  Patient denies  fevers or chills.   Assessment & Plan: Visit Diagnoses:  1. Pain in right leg     Plan: Impression is mild improvement in right leg symptoms with right hip injection.  She does have some arthritis early on that right-hand side.  Radiographically is worse on the left.  She did get some improvement but not enough that would diminish the necessity for her to have a larger spacer placed into that right knee.  It sounds like she still is having some flexion instability associated type pain.  MRI scan of the back is pending.  I will have her follow-up with Dr. Ninfa Linden after MRI scan of the lumbar spine.  If no right-sided localizing pathology is present I think it makes good sense to proceed with revision on the right-hand side to place a bigger spacer to help with some of her pain and giving way on the right-hand side.  Follow-Up Instructions: No follow-ups on file.   Orders:  No orders of the defined types were placed in this encounter.  No orders of the  defined types were placed in this encounter.     Procedures: No procedures performed   Clinical Data: No additional findings.  Objective: Vital Signs: There were no vitals taken for this visit.  Physical Exam:   Constitutional: Patient appears well-developed HEENT:  Head: Normocephalic Eyes:EOM are normal Neck: Normal range of motion Cardiovascular: Normal rate Pulmonary/chest: Effort normal Neurologic: Patient is alert Skin: Skin is warm Psychiatric: Patient has normal mood and affect    Ortho Exam: Ortho exam demonstrates full active and passive range of motion of the ankles and hips.  Diminished groin pain on the right with hip range of motion.  Knee on the right has no effusion.  No warmth.  Extensor mechanism is intact.  There is some laxity present.  This is primarily in flexion.  Specialty Comments:  No specialty comments available.  Imaging: No results found.   PMFS History: Patient Active Problem List   Diagnosis Date Noted  . History of total knee replacement, left 06/13/2020  . History of total knee arthroplasty, right 06/13/2020  . Acute right-sided back pain 06/09/2020  . Depression, major, single episode, mild ([place]) 05/12/2020  . Vision changes 12/31/2019  . Bilateral knee pain 10/08/2019  . Poor dentition 10/08/2019  .  Chronic left shoulder pain 08/31/2019  . Hemorrhoids 04/04/2019  . History of stroke 01/01/2019  . Hypertension associated with diabetes ([place]) 01/01/2019  . Pre-diabetes 01/01/2019  . Hot flashes 01/01/2019  . Onychomycosis 01/01/2019   Past Medical History:  Diagnosis Date  . Cerebrovascular disease   . Hypertension   . Type 2 diabetes mellitus ([place])     History reviewed. No pertinent family history.  Past Surgical History:  Procedure Laterality Date  . THYROIDECTOMY     Social History   Occupational History  . Not on file  Tobacco Use  . Smoking status: Current Every Day Smoker  . Smokeless tobacco: Never Used   Substance and Sexual Activity  . Alcohol use: Never  . Drug use: Never  . Sexual activity: Not on file

## 2020-07-29 ENCOUNTER — Encounter: Payer: Self-pay | Admitting: Family Medicine

## 2020-07-29 ENCOUNTER — Ambulatory Visit (INDEPENDENT_AMBULATORY_CARE_PROVIDER_SITE_OTHER): Payer: Medicaid Other | Admitting: Family Medicine

## 2020-07-29 ENCOUNTER — Other Ambulatory Visit: Payer: Self-pay

## 2020-07-29 DIAGNOSIS — N750 Cyst of Bartholin's gland: Secondary | ICD-10-CM | POA: Diagnosis not present

## 2020-07-29 NOTE — Patient Instructions (Signed)
Thank you for coming to see me today. It was a pleasure. Today we discussed the vaginal boil. I recommend keeping an eye on it and using warm compresses and sitz baths. If it gets bigger we can drain it and give you antibiotics. If you develop fevers/chills/drainge over the weekend go the ER/urgent care.  Please follow-up with me next week.  If you have any questions or concerns, please do not hesitate to call the office at [phone].  Best wishes,   Dr Posey Pronto

## 2020-07-29 NOTE — Assessment & Plan Note (Addendum)
Possible 1cm bartholin cyst at 4 o'clock position on vulva. It is too small to be an abscess. Will opt for conservative management for now with warm compresses and sitz bath which I have explained to the patient. F/u in 1 week. If enlarging>3cm, pus/fluid leakage will treat with doxycyline +/-  I&D. Pt expressed understanding and is happy with the plan.

## 2020-07-29 NOTE — Progress Notes (Signed)
     SUBJECTIVE:   CHIEF COMPLAINT / HPI:   Monica Watson is a 65 y.o. female presents with boil in vaginal area  Boil on vulva Small 1cm boil started 2 days ago on the left genital region and it feels like a knot. She has put some epsom salt and ETOH in her bath water to help. Denies fevers. Denies fluid leakage, skin erythema or pruritus. Is sexually active but has not been in the last 3 months.  PERTINENT  PMH / PSH: DM, HTN, depression  OBJECTIVE:   BP 108/73   Pulse 80   Ht 5\' 5"  (1.651 m)   Wt 197 lb (89.4 kg)   SpO2 99%   BMI 32.78 kg/m    General: Alert, no acute distress, pleasant, cooperative  Cardio: well perfused  Pulm: normal work of breathing Extremities: No peripheral edema.  Neuro: Cranial nerves grossly intact   Pelvic Exam chaperoned by CMA April        External: normal female genitalia without lesions or masses        1cm round, fluctuant, round mass at 4 o'clock position on vulva, non tender, no overlying skin changes, no fluid leakage                 ASSESSMENT/PLAN:   Bartholin cyst Possible 1cm bartholin cyst at 4 o'clock position on vulva. It is too small to be an abscess. Will opt for conservative management for now with warm compresses and sitz bath which I have explained to the patient. F/u in 1 week. If enlarging>3cm, pus/fluid leakage will treat with doxycyline +/-  I&D.     Lattie Haw, MD PGY-2 [place]

## 2020-08-02 NOTE — Progress Notes (Deleted)
NEUROLOGY FOLLOW UP OFFICE NOTE  Monica Watson [id number]   Subjective:  Monica Watson is a 65 year old right-handed female with HTN, type 2 diabetes and history of bilateral knee replacement who follows up for orthostatic tremor.  UPDATE: In September, I prescribed clonazepam 2.5mg  BID. She also takes gabapentin 800mg  three times daily for neuralgia.  HISTORY: She started having shaking in both legs since a mild stroke in 2011.  She reports some residual left sided weakness from that stroke.  When she stands up, her legs start to shake.  It does stop.  It does not occur when sitting or laying down.  She denies tremor in the hands.  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  Hgb A1c from  June was 6.1. BMP from July normal except for elevated glucose of 146.  TSH from last year was 0.594.   PAST MEDICAL HISTORY: Past Medical History:  Diagnosis Date  . Cerebrovascular disease   . Hypertension   . Type 2 diabetes mellitus (HCC)     MEDICATIONS: Current Outpatient Medications on File Prior to Visit  Medication Sig Dispense Refill  . atorvastatin (LIPITOR) 20 MG tablet TAKE 1 TABLET(20 MG) BY MOUTH DAILY 60 tablet 0  . clonazePAM (KLONOPIN) 0.5 MG tablet Take 0.5 tablets (0.25 mg total) by mouth 2 (two) times daily. 30 tablet 3  . ferrous sulfate (FEROSUL) 325 (65 FE) MG tablet Take 1 tablet (325 mg total) by mouth daily. 30 tablet 3  . gabapentin (NEURONTIN) 800 MG tablet Take 800 mg by mouth 3 (three) times daily.    Marland Kitchen losartan (COZAAR) 50 MG tablet TAKE 1 TABLET BY MOUTH EVERY DAY FOR BLOOD PRESSURE 90 tablet 0  . metFORMIN (GLUCOPHAGE-XR) 500 MG 24 hr tablet TAKE 2 TABLETS BY MOUTH QD WITH EVENING MEAL FOR DIABETES 90 tablet 0  . ondansetron (ZOFRAN ODT) 4 MG disintegrating tablet Take 1 tablet (4 mg total) by mouth every 8 (eight) hours as needed for nausea or vomiting. 20 tablet 0  .  oxyCODONE-acetaminophen (PERCOCET) 10-325 MG tablet TAKE 1 TABLET BY MOUTH 4 TIMES DAILY AS NEEDED    . PREMARIN vaginal cream INSERT 1 APPLICATORFUL VAGINALLY DAILY 30 g 6  . promethazine (PHENERGAN) 12.5 MG tablet Take 1 tablet (12.5 mg total) by mouth every 8 (eight) hours as needed for nausea or vomiting. 30 tablet 0  . tiZANidine (ZANAFLEX) 2 MG tablet Take 1 tablet (2 mg total) by mouth 2 (two) times daily as needed for muscle spasms. 30 tablet 0   No current facility-administered medications on file prior to visit.    ALLERGIES: No Known Allergies  FAMILY HISTORY: No family history on file. SOCIAL HISTORY: Social History   Socioeconomic History  . Marital status: Single    Spouse name: Not on file  . Number of children: Not on file  . Years of education: Not on file  . Highest education level: Not on file  Occupational History  . Not on file  Tobacco Use  . Smoking status: Current Every Day Smoker  . Smokeless tobacco: Never Used  Substance and Sexual Activity  . Alcohol use: Never  . Drug use: Never  . Sexual activity: Not on file  Other Topics Concern  . Not on file  Social History Narrative   Retired    Right handed   Social Determinants of Health   Financial Resource Strain: Not on file  Food Insecurity: Not on file  Transportation Needs: Not on file  Physical Activity: Not on file  Stress: Not on file  Social Connections: Not on file  Intimate Partner Violence: Not on file     Objective:  *** General: No acute distress.  Patient appears well-groomed.   Head:  Normocephalic/atraumatic Eyes:  Fundi examined but not visualized Neck: supple, no paraspinal tenderness, full range of motion Heart:  Regular rate and rhythm Lungs:  Clear to auscultation bilaterally Back: No paraspinal tenderness Neurological Exam: alert and oriented to person, place, and time. Attention span and concentration intact, recent and remote memory intact, fund of knowledge  intact.  Speech fluent and not dysarthric, language intact.  CN II-XII intact. Bulk and tone normal, muscle strength 5/5 throughout.  Sensation to light touch, temperature and vibration intact.  Deep tendon reflexes 2+ throughout, toes downgoing.  Finger to nose and heel to shin testing intact.  Gait normal, Romberg negative.   Assessment/Plan:   Orthostatic tremor  ***  Metta Clines, DO  CC: Sharion Settler, DO

## 2020-08-03 ENCOUNTER — Ambulatory Visit: Payer: Medicaid Other | Admitting: Neurology

## 2020-08-05 ENCOUNTER — Ambulatory Visit: Payer: Medicaid Other | Admitting: Family Medicine

## 2020-08-05 NOTE — Progress Notes (Deleted)
     SUBJECTIVE:   CHIEF COMPLAINT / HPI:   Cadience Bradfield is a 65 y.o. female presents with for follow up   Boil on vulva Presents for follow up for follow up for bartholins cysts. Seen in clinic 1 week and treated with conservative management-sitz bath and warm compresses. Initially it was 1cm and felt like a knot. Since then patient reports improvement in symptoms **. Associated symptoms include **. Denies fevers. Denies fluid leakage, skin erythema or pruritus. Is sexually active but has not been in the last 3 months.  PERTINENT  PMH / PSH: hemorrhoids, HTN, DM  OBJECTIVE:   There were no vitals taken for this visit.   General: Alert, no acute distress Cardio: Normal S1 and S2, RRR, no r/m/g Pulm: CTAB, normal work of breathing Abdomen: Bowel sounds normal. Abdomen soft and non-tender.  Extremities: No peripheral edema.  Neuro: Cranial nerves grossly intact   Pelvic Exam chaperoned by CMA **         External: normal female genitalia without lesions or masses        Vagina: normal without lesions or masses        Cervix: normal without lesions or masses        Pap smear: performed        Samples for Wet prep, GC/Chlamydia obtained ASSESSMENT/PLAN:   No problem-specific Assessment & Plan notes found for this encounter.     Lattie Haw, MD PGY-2 [facility]

## 2020-08-10 ENCOUNTER — Other Ambulatory Visit: Payer: Self-pay

## 2020-08-10 ENCOUNTER — Other Ambulatory Visit (HOSPITAL_COMMUNITY)
Admission: RE | Admit: 2020-08-10 | Discharge: 2020-08-10 | Disposition: A | Discharge status: Home / Self Care | Payer: Medicaid Other | Source: Ambulatory Visit | Attending: Family Medicine | Admitting: Family Medicine

## 2020-08-10 ENCOUNTER — Encounter: Payer: Self-pay | Admitting: Family Medicine

## 2020-08-10 ENCOUNTER — Ambulatory Visit (INDEPENDENT_AMBULATORY_CARE_PROVIDER_SITE_OTHER): Payer: Medicaid Other | Admitting: Family Medicine

## 2020-08-10 VITALS — BP 108/74 | HR 65 | Wt 197.6 lb

## 2020-08-10 DIAGNOSIS — N750 Cyst of Bartholin's gland: Secondary | ICD-10-CM

## 2020-08-10 DIAGNOSIS — Z124 Encounter for screening for malignant neoplasm of cervix: Secondary | ICD-10-CM | POA: Insufficient documentation

## 2020-08-10 NOTE — Patient Instructions (Addendum)
It was wonderful to see you today!   Today I performed a Pap smear. I will let you know the results of that.   I think you also have what we call a Bartholin cyst. This is caused by the duct becoming blocked. Use warm compresses to the area to help. I will also send a referral to a gynecologist so that it can be drained if it does not get better.    Thank you for choosing [facility].   Please call [phone] with any questions about today's appointment.  Please be sure to schedule follow up at the front  desk before you leave today.   Sharion Settler, DO PGY-1 [facility]

## 2020-08-10 NOTE — Assessment & Plan Note (Addendum)
Patient with left Bartholin cyst.  She was seen recently for similar problem. No pain, erythema, drainage or tenderness to the area. She has tried warm baths with Epson salt and alcohol without relief.  It has not increased in size since the last visit.  Recommended warm compresses but do feel that this will require drainage with Word catheter placement.  Referral placed for GYN as we do not have Word catheter in the clinic.

## 2020-08-10 NOTE — Progress Notes (Signed)
    SUBJECTIVE:   CHIEF COMPLAINT / HPI:   Cervical Cancer Screening Patient presents today for Pap smear.  She is unable to tell me when her last Pap smear was but believes it was normal.  No other complaints besides her cyst described below.  Bartholin Cyst Abscess  Present x1 week.  On hr left-side of vulva.  Patient denies pain to the area.  She has been taking warm baths with Epson salt and alcohol and has not noticed an increase or decrease in the size.  States she has had this in the past before and it self resolved.  PERTINENT  PMH / PSH:  Past Medical History:  Diagnosis Date  . Cerebrovascular disease   . Hypertension   . Type 2 diabetes mellitus (HCC)     OBJECTIVE:   BP 108/74   Pulse 65   Wt 197 lb 9.6 oz (89.6 kg)   SpO2 97%   BMI 32.88 kg/m    General: NAD, pleasant, able to participate in exam Cardio: RRR without murmur Lungs: CTAB without wheezing/rhonchi/rales GU: 1x1cm mobile, round and indurated mass at approximately the 4 o'clock position on left vulva. Non-erythematous, non-tender to palpation, no drainage  Exam chaperoned by CMA Kallie Locks  ASSESSMENT/PLAN:   Cervical cancer screening Patient presents for cervical cancer screening. Pap with HPV reflex done. Cervix without any obvious abnormalities. Unknown when her last screening was and unknown results of that or if she has had abnormal results in the past. Records not available on Care Everywhere. Will need to investigate further to know for sure if patient requires more screening after this one.   Bartholin cyst Patient with left Bartholin cyst.  She was seen recently for similar problem. No pain, erythema, drainage or tenderness to the area. She has tried warm baths with Epson salt and alcohol without relief.  It has not increased in size since the last visit.  Recommended warm compresses but do feel that this will require drainage with Word catheter placement.  Referral placed for GYN as we do  not have Word catheter in the clinic.     Sharion Settler, DO [facility]    This note was prepared using Dragon voice recognition software and may include unintentional dictation errors due to the inherent limitations of voice recognition software.

## 2020-08-10 NOTE — Assessment & Plan Note (Signed)
Patient presents for cervical cancer screening. Pap with HPV reflex done. Cervix without any obvious abnormalities. Unknown when her last screening was and unknown results of that or if she has had abnormal results in the past. Records not available on Care Everywhere. Will need to investigate further to know for sure if patient requires more screening after this one.

## 2020-08-15 LAB — CYTOLOGY - PAP: Diagnosis: NEGATIVE

## 2020-08-17 ENCOUNTER — Other Ambulatory Visit: Payer: Self-pay

## 2020-08-17 ENCOUNTER — Ambulatory Visit: Payer: Medicaid Other | Attending: Family Medicine | Admitting: Speech Pathology

## 2020-08-17 ENCOUNTER — Ambulatory Visit: Payer: Medicaid Other

## 2020-08-17 VITALS — BP 112/72

## 2020-08-17 DIAGNOSIS — M6281 Muscle weakness (generalized): Secondary | ICD-10-CM

## 2020-08-17 DIAGNOSIS — R471 Dysarthria and anarthria: Secondary | ICD-10-CM | POA: Diagnosis present

## 2020-08-17 DIAGNOSIS — I69352 Hemiplegia and hemiparesis following cerebral infarction affecting left dominant side: Secondary | ICD-10-CM

## 2020-08-17 DIAGNOSIS — R2681 Unsteadiness on feet: Secondary | ICD-10-CM | POA: Diagnosis present

## 2020-08-17 DIAGNOSIS — R2689 Other abnormalities of gait and mobility: Secondary | ICD-10-CM | POA: Diagnosis present

## 2020-08-17 NOTE — Therapy (Signed)
[facility] [address] [place], [place], [address] Phone: [phone]   Fax:  [phone]  Speech Language Pathology Evaluation  Patient Details  Name: Monica Watson MRN: [id number] Date of Birth: Nov 11, 1955 Referring Provider (SLP): Dr. Yehuda Savannah   Encounter Date: 08/17/2020   End of Session - 08/17/20 1550    Visit Number 1    Number of Visits 13    Date for SLP Re-Evaluation 09/28/20   1st 3 visits, 2x a week for 3 weeks, 1x a week for 3 weeks (12 total)   Authorization Type pending medicaid auth    SLP Start Time 6761    SLP Stop Time  9509    SLP Time Calculation (min) 40 min           Past Medical History:  Diagnosis Date  . Cerebrovascular disease   . Hypertension   . Type 2 diabetes mellitus ([place])     Past Surgical History:  Procedure Laterality Date  . THYROIDECTOMY      There were no vitals filed for this visit.   Subjective Assessment - 08/17/20 1458    Subjective "I was in [place] and got denied any therapy"              SLP Evaluation [facility] - 08/17/20 1511      SLP Visit Information   SLP Received On 08/17/20    Referring Provider (SLP) Dr. Yehuda Savannah    Onset Date 2010    Medical Diagnosis hx of stroke      Subjective   Patient/Family Stated Goal "To get better"      General Information   HPI Patient continues to feel generally weak since her stroke in 2010.  She has been unable to receive home health physical therapy due to insurance issues.  Patient would qualify for outpatient physical therapy and speech therapy for improvement in strength and speech.  She will be able to get [facility].  Additionally feel that she will benefit from getting out of her house for these appointments    Mobility Status walk with quad cane - PT eval today      Balance Screen   Has the patient fallen in the past 6 months No    Has the patient had a decrease in  activity level because of a fear of falling?  No    Is the patient reluctant to leave their home because of a fear of falling?  No      Prior Functional Status   Cognitive/Linguistic Baseline Within functional limits    Type of Home Apartment     Lives With Alone    Vocation On disability      Cognition   Overall Cognitive Status Impaired/Different from baseline    Area of Impairment Memory    Memory Impaired    Memory Impairment Storage deficit;Decreased recall of new information;Decreased short term memory    Awareness Appears intact      Auditory Comprehension   Overall Auditory Comprehension Appears within functional limits for tasks assessed      Reading Comprehension   Reading Status Within funtional limits      Expression   Primary Mode of Expression Verbal      Verbal Expression   Overall Verbal Expression Appears within functional limits for tasks assessed      Written Expression   Dominant Hand Right      Oral Motor/Sensory Function   Overall Oral Motor/Sensory  Function Appears within functional limits for tasks assessed    Labial ROM --   denies loss of sensation or weakness on left     Motor Speech   Overall Motor Speech Impaired    Respiration Within functional limits    Phonation Breathy;Hoarse   smoker voice   Resonance Within functional limits    Articulation Impaired    Level of Impairment Phrase    Intelligibility Intelligibility reduced    Word 75-100% accurate    Phrase 50-74% accurate    Sentence 50-74% accurate    Conversation 50-74% accurate    Motor Planning Witnin functional limits    Motor Speech Errors Aware    Effective Techniques Slow rate;Increased vocal intensity;Over-articulate;Pause                           SLP Education - 08/17/20 1549    Education Details HEP for dysarthria, compensations for dysarthria    Person(s) Educated Patient    Methods Explanation;Demonstration;Verbal cues;Handout    Comprehension  Verbalized understanding;Returned demonstration;Verbal cues required;Need further instruction            SLP Short Term Goals - 08/17/20 1600      SLP SHORT TERM GOAL #1   Title Pt will complete HEP for dysarthira with rare min A    Baseline no HEP    Time 4    Period Weeks    Status New      SLP SHORT TERM GOAL #2   Title Pt will carryover compensations for dysarthria in structured task 18/20 sentences    Baseline no compensations    Time 4    Period Weeks    Status New      SLP SHORT TERM GOAL #3   Title Pt will carryover compensations for dysarthria in simple conversation over 5 minutes to be 95% intelligible    Baseline 85% intelligible in quiet room    Time 4    Period Weeks    Status New            SLP Long Term Goals - 08/17/20 1601      SLP LONG TERM GOAL #1   Title Pt will complete HEP for dysarthria with mod I    Baseline no HEP    Time 8    Period Weeks    Status New      SLP LONG TERM GOAL #2   Title Pt will be 95% intelligible over 18 minute conversation with rare min A    Baseline 85% in short conversation    Time 8    Period Weeks    Status New      SLP LONG TERM GOAL #3   Title Pt will report 50% reduction (sujectively) of family requesting her to repeat herself over the phone    Baseline no reduction, frequently asked to repeat    Time 8    Period Weeks    Status New      SLP LONG TERM GOAL #4   Title Pt will improve score on Communicative Effectiveness Survey by 3 points    Baseline 17 points    Time 8    Period Weeks    Status New            Plan - 08/17/20 1551    Clinical Impression Statement Monica Watson is referred by physician due to speech difficulty s/p CVA 2010 (history of stroke Z86.73). Monica Watson  has not received any prior ST. She reports difficulty communicating, especially with strangers, such as MD office, and frustration with communication. She reports she avoids communicating with others, including her sisters  and wants to leave family events because of her speech. Monica Watson endorses that her kids frequently ask her to slow down or repeat herself. She has limited social interactions since moving to [place] as her speech and mobility have reduced her ability to form relationships. She denies dysphagia. Today, Ann pressents with mild to moderate dysarthria (R47.1), and is judged to be 85% intelligible in simple conversations in this quiet room. She scored a 17 on the Communicative Effectiveness Survey, rating conversing in a noisy environment and at a distance "not at all effective" and speaking over the telephone, when upset and in a car is reduced effectiveness. When asked what she does to help others understand her, she states she wants to talk " faster" and is asked to slow down. She did not ID compensatory strategies to improve communication. I recommend skilled ST to maximize intelligiblity for safety, effectively communicate medical information to providers,  to reduce social isolation and  improve QOL.    Speech Therapy Frequency 2x / week    Duration --   initial 3, then 2x for 3, then 1x for 3 weeks (12 total)   Treatment/Interventions Environmental controls;Language facilitation;Cueing hierarchy;SLP instruction and feedback;Compensatory strategies;Functional tasks;Compensatory techniques;Cognitive reorganization;Patient/family education;Multimodal communcation approach    Potential to Achieve Goals Good           Patient will benefit from skilled therapeutic intervention in order to improve the following deficits and impairments:   Dysarthria and anarthria    Problem List Patient Active Problem List   Diagnosis Date Noted  . Cervical cancer screening 08/10/2020  . Bartholin cyst 07/29/2020  . History of total knee replacement, left 06/13/2020  . History of total knee arthroplasty, right 06/13/2020  . Acute right-sided back pain 06/09/2020  . Depression, major, single episode, mild ([place])  05/12/2020  . Vision changes 12/31/2019  . Bilateral knee pain 10/08/2019  . Poor dentition 10/08/2019  . Chronic left shoulder pain 08/31/2019  . Hemorrhoids 04/04/2019  . History of stroke 01/01/2019  . Hypertension associated with diabetes ([place]) 01/01/2019  . Pre-diabetes 01/01/2019  . Hot flashes 01/01/2019  . Onychomycosis 01/01/2019    Azaiah Licciardi, Annye Rusk MS, CCC-SLP 08/17/2020, 4:06 PM  [facility] [address] [place], [place], [address] Phone: [phone]   Fax:  [phone]  Name: Joyia Riehle MRN: [id number] Date of Birth: [birth year]

## 2020-08-17 NOTE — Patient Instructions (Signed)
     SLOW LOUD OVER-ENNUNCIATE PAUSE  Make each sound really big - you may feel silly - open your mouth wide  PATA TAKA KAPA PATAKA  BUTTERCUP  CATERPILLAR  BASEBALLL PLAYER  [place]  SLOW AND BIG - EXAGGERATE YOUR MOUTH, MAKE EACH CONSONANT  Read the sentences very slow and really big

## 2020-08-18 NOTE — Therapy (Signed)
[facility] [address] [place], [place], [address] Phone: [phone]   Fax:  [phone]  Physical Therapy Evaluation  Patient Details  Name: Monica Watson MRN: [id number] Date of Birth: 10/25/1955 Referring Provider (PT): margaret pray   Encounter Date: 08/17/2020   PT End of Session - 08/17/20 1536    Visit Number 1    Number of Visits 9    Authorization Type medicaid    PT Start Time 1324    PT Stop Time 1613    PT Time Calculation (min) 40 min    Equipment Utilized During Treatment Gait belt    Activity Tolerance Patient tolerated treatment well    Behavior During Therapy [facility] for tasks assessed/performed           Past Medical History:  Diagnosis Date  . Cerebrovascular disease   . Hypertension   . Type 2 diabetes mellitus ([place])     Past Surgical History:  Procedure Laterality Date  . THYROIDECTOMY      Vitals:   08/17/20 1536  BP: 112/72      Subjective Assessment - 08/17/20 1536    Subjective Pt was referred for history of CVA. Reports her stroke was in 2010 and she has left hemiparesis. Pt also has dysarthric speech. Pt reports that she would like to work on her walking. She uses [facility] when she goes out of home but not all the time in the house. She denies any recent falls. Pt denies any therapy for stroke but did have TKR on left 2011 and right 2017. Reports she only had therapy after left TKR as insurance denied it.    Patient Stated Goals Pt would like to work on her walking.    Currently in Pain? Yes    Pain Score 7     Pain Location Back    Pain Orientation Right;Lower    Pain Type Chronic pain    Pain Onset More than a month ago    Pain Frequency Constant    Aggravating Factors  pt reports she has been told she has a lot of arthritis. Is worse when first gets up in the morning. Walking a lot.    Pain Relieving Factors warmth              [facility] PT Assessment - 08/17/20 1543       Assessment   Medical Diagnosis history of CVA    Referring Provider (PT) margaret pray    Onset Date/Surgical Date 05/12/20   referral date as CVA was in 2010   Hand Dominance Left      Precautions   Precautions Fall      Balance Screen   Has the patient fallen in the past 6 months No    Has the patient had a decrease in activity level because of a fear of falling?  No    Is the patient reluctant to leave their home because of a fear of falling?  No      Home Environment   Living Environment Private residence    Living Arrangements Alone    Type of [place] to enter    Entrance Stairs-Number of Steps 3    Entrance Stairs-Rails Left    Home Layout One level    [place] - quad;Shower seat    Additional Comments just moved to [place] 2 years ago      Prior Function   Level  of Independence Independent with community mobility with device;Independent with household mobility with device;Independent with basic ADLs   does not drive   Vocation On disability    Leisure watch TV      Cognition   Overall Cognitive Status Impaired/Different from baseline    Memory --   pt reports that she forgets things since her stroke     Observation/Other Assessments   Observations wearing bilateral knee compression supports      Sensation   Light Touch Appears Intact    Additional Comments intact light touch in feet and hands but reports feels slightly less on left.      Coordination   Gross Motor Movements are Fluid and Coordinated Yes    Fine Motor Movements are Fluid and Coordinated Yes      ROM / Strength   AROM / PROM / Strength Strength      Strength   Strength Assessment Site Shoulder;Elbow;Hand;Hip;Knee;Ankle    Right/Left Shoulder Right;Left    Right Shoulder Flexion 4/5    Left Shoulder Flexion 4/5    Right/Left Elbow Right;Left    Right Elbow Flexion 4+/5    Right Elbow Extension 4+/5    Left Elbow Flexion 4+/5    Left Elbow  Extension 4+/5    Right/Left hand Right;Left    Right Hand Gross Grasp Functional    Left Hand Gross Grasp Functional    Right/Left Hip Right;Left    Right Hip Flexion 5/5    Right Hip ABduction 4+/5    Left Hip Flexion 4/5    Left Hip ABduction 4/5    Right/Left Knee Right;Left    Right Knee Flexion 5/5    Right Knee Extension 5/5    Left Knee Flexion 3+/5    Left Knee Extension 4/5    Right/Left Ankle Right;Left    Right Ankle Dorsiflexion 5/5    Left Ankle Dorsiflexion 5/5      Transfers   Transfers Sit to Stand;Stand to Sit    Sit to Stand 5: Supervision    Five time sit to stand comments  17.21 sec from chair without hands    Stand to Sit 5: Supervision      Ambulation/Gait   Ambulation/Gait Yes    Ambulation/Gait Assistance 5: Supervision    Ambulation Distance (Feet) 100 Feet    Assistive device Small based quad cane    Gait Pattern Step-through pattern;Decreased stance time - left;Decreased step length - right;Decreased step length - left;Decreased hip/knee flexion - left;Decreased weight shift to left    Ambulation Surface Level;Indoor    Gait velocity 43.06 sec=0.19m/s    Stairs Yes    Stairs Assistance 5: Supervision    Stairs Assistance Details (indicate cue type and reason) reciprocal up and step-to down    Stair Management Technique One rail Left;Alternating pattern;Step to pattern    Number of Stairs 4    Height of Stairs 6      Standardized Balance Assessment   Standardized Balance Assessment Timed Up and Go Test      Timed Up and Go Test   TUG Normal TUG    Normal TUG (seconds) 25.26   with quad cane first. Then without AD=19.28 sec                     Objective measurements completed on examination: See above findings.               PT Education - 08/18/20 3474  Education Details Discussed PT plan of care    Person(s) Educated Patient    Methods Explanation    Comprehension Verbalized understanding            PT  Short Term Goals - 08/18/20 0829      PT SHORT TERM GOAL #1   Title Pt will be able to perform initial HEP for strengthening and balance on own.    Baseline no current HEP    Time 4    Period Weeks    Status New    Target Date 09/17/20      PT SHORT TERM GOAL #2   Title Pt will decrease 5 x sit to stand from 17.21 sec to <15 sec for improved balance and functional strength.    Baseline 08/17/20 17.21 sec from chair thoughout hands    Time 4    Period Weeks    Status New    Target Date 09/17/20      PT SHORT TERM GOAL #3   Title Pt will increase gait speed from 0.68m/s to >0.81m/s for improved household mobility.    Baseline 0.71m/s    Time 4    Period Weeks    Status New    Target Date 09/17/20      PT SHORT TERM GOAL #4   Title Pt will be able to ambulate >300' with [facility] mod I for improved household and short community distances.    Baseline 100' supervision with [facility]    Time 4    Period Weeks    Status New    Target Date 09/17/20             PT Long Term Goals - 08/18/20 1255      PT LONG TERM GOAL #1   Title Pt will be independent with progressive HEP for strengthening and balance to continue gains on own.    Baseline no current HEP    Time 8    Period Weeks    Status New    Target Date 10/17/20      PT LONG TERM GOAL #2   Title Pt will decrease TUG from 25.26 sec to <20 sec with quad cane for improved balance and decreased fall risk.    Baseline 25.26 sec with SBQC    Time 8    Period Weeks    Status New    Target Date 10/17/20      PT LONG TERM GOAL #3   Title Pt will increase gait speed from 0.11m/s to >0.62m/s for improved mobility.    Baseline 0.58m/s    Time 8    Period Weeks    Status New    Target Date 10/17/20      PT LONG TERM GOAL #4   Title Pt will ambulate 500' on varied surfaces with SBQC on varied surfaces mod I.    Baseline 100' supervision with [facility] on level    Time 8    Period Weeks    Status New    Target Date 10/17/20       PT LONG TERM GOAL #5   Title Pt will ambulate >200' without AD on level surfaces for improved household mobility.    Baseline 100' SBQC supervision on level    Time 8    Period Weeks    Status New    Target Date 10/17/20                  Plan - 08/18/20 9562  Clinical Impression Statement Pt is 65 y/o female with history of CVA in 2010. Reports she did not have any therapy after her CVA. Presents with mild left hemiparesis and dysarthric speech for which she is seeing ST. Pt has history of bilateral TKR and does report some pain in right low back/hip area. Pt has impaired gait quality with decreased stance time on left and uses SBQC in community. Gait speed is slow at 0.22m/s indicating decreased safety for household ambulator. She is fall risk based on TUG of 25.26 sec and 5 x sit to stand of 17.21 sec. Pt will benefit from skilled PT to address strength, balance and functional mobility deficits.    Personal Factors and Comorbidities Time since onset of injury/illness/exacerbation;Comorbidity 3+    Comorbidities HTN, DM, arthritis    Examination-Activity Limitations Locomotion Level;Transfers;Stairs    Examination-Participation Restrictions Community Activity;Cleaning    Stability/Clinical Decision Making Evolving/Moderate complexity    Clinical Decision Making Moderate    Rehab Potential Good    PT Frequency 1x / week   plus eval   PT Duration 8 weeks    PT Treatment/Interventions ADLs/Self Care Home Management;Aquatic Therapy;DME Instruction;Gait training;Stair training;Functional mobility training;Therapeutic activities;Therapeutic exercise;Balance training;Neuromuscular re-education;Manual techniques;Patient/family education;Vestibular    PT Next Visit Plan Begin LLE strengthening HEP. Balance training. Gait with SBQC and without as able.    Consulted and Agree with Plan of Care Patient           Patient will benefit from skilled therapeutic intervention in order to  improve the following deficits and impairments:  Abnormal gait,Decreased balance,Decreased mobility,Decreased strength,Impaired sensation,Decreased endurance,Pain  Visit Diagnosis: Other abnormalities of gait and mobility  Muscle weakness (generalized)  Hemiplegia and hemiparesis following cerebral infarction affecting left dominant side (HCC)  Unsteadiness on feet     Problem List Patient Active Problem List   Diagnosis Date Noted  . Cervical cancer screening 08/10/2020  . Bartholin cyst 07/29/2020  . History of total knee replacement, left 06/13/2020  . History of total knee arthroplasty, right 06/13/2020  . Acute right-sided back pain 06/09/2020  . Depression, major, single episode, mild ([place]) 05/12/2020  . Vision changes 12/31/2019  . Bilateral knee pain 10/08/2019  . Poor dentition 10/08/2019  . Chronic left shoulder pain 08/31/2019  . Hemorrhoids 04/04/2019  . History of stroke 01/01/2019  . Hypertension associated with diabetes ([place]) 01/01/2019  . Pre-diabetes 01/01/2019  . Hot flashes 01/01/2019  . Onychomycosis 01/01/2019    Electa Sniff, PT, DPT, NCS 08/18/2020, 1:00 PM  [facility] [address] [place], [place], [address] Phone: [phone]   Fax:  [phone]  Name: Monica Watson MRN: [id number] Date of Birth: [birth year]

## 2020-08-24 ENCOUNTER — Ambulatory Visit: Payer: Medicaid Other | Attending: Family Medicine

## 2020-08-24 ENCOUNTER — Ambulatory Visit: Payer: Medicaid Other

## 2020-08-24 ENCOUNTER — Other Ambulatory Visit: Payer: Self-pay

## 2020-08-24 DIAGNOSIS — R471 Dysarthria and anarthria: Secondary | ICD-10-CM

## 2020-08-24 DIAGNOSIS — I69352 Hemiplegia and hemiparesis following cerebral infarction affecting left dominant side: Secondary | ICD-10-CM | POA: Diagnosis present

## 2020-08-24 DIAGNOSIS — R2689 Other abnormalities of gait and mobility: Secondary | ICD-10-CM | POA: Diagnosis not present

## 2020-08-24 DIAGNOSIS — M6281 Muscle weakness (generalized): Secondary | ICD-10-CM | POA: Diagnosis present

## 2020-08-24 NOTE — Therapy (Signed)
[facility] [address] [place], [place], [address] Phone: [phone]   Fax:  [phone]  Speech Language Pathology Treatment  Patient Details  Name: Monica Watson MRN: [id number] Date of Birth: 10-27-1955 Referring Provider (SLP): Dr. Yehuda Savannah   Encounter Date: 08/24/2020   End of Session - 08/24/20 1450    Visit Number 2    Number of Visits 13    Date for SLP Re-Evaluation 09/28/20    Authorization Type pending medicaid auth    SLP Start Time 1410    SLP Stop Time  1445    SLP Time Calculation (min) 35 min    Activity Tolerance Patient tolerated treatment well           Past Medical History:  Diagnosis Date  . Cerebrovascular disease   . Hypertension   . Type 2 diabetes mellitus ([place])     Past Surgical History:  Procedure Laterality Date  . THYROIDECTOMY      There were no vitals filed for this visit.   Subjective Assessment - 08/24/20 1454    Subjective Pt arrived 10 mins late    Currently in Pain? Yes    Pain Score 9     Pain Location Hip    Pain Orientation Right    Pain Type Chronic pain    Pain Onset More than a month ago    Aggravating Factors  12 steps                 ADULT SLP TREATMENT - 08/24/20 1421      General Information   Behavior/Cognition Alert;Cooperative;Pleasant mood      Treatment Provided   Treatment provided Cognitive-Linquistic      Cognitive-Linquistic Treatment   Treatment focused on Dysarthria;Patient/family/caregiver education    Skilled Treatment Pt arrived 10 minutes late due to transportation. SLP re-introduced SLOP from evaluation with SLP modeling required for syllables, single words, and short phrases. Occasional fading to rare min A required to slow rate of speech and increase volume for sentence level. SLP targeted reading aloud posts on social media, with min A required for reading errors. SLP added to HEP for futher opportunites to  practice loud/slow speech as pt lives alone.      Assessment / Recommendations / Plan   Plan Continue with current plan of care      Progression Toward Goals   Progression toward goals Progressing toward goals            SLP Education - 08/24/20 1450    Education Details SLOP, functional reading aloud activities    Person(s) Educated Patient    Methods Explanation;Handout;Demonstration    Comprehension Verbalized understanding;Returned demonstration;Need further instruction            SLP Short Term Goals - 08/24/20 1414      SLP SHORT TERM GOAL #1   Title Pt will complete HEP for dysarthira with rare min A    Baseline no HEP    Time 4    Period Weeks    Status On-going      SLP SHORT TERM GOAL #2   Title Pt will carryover compensations for dysarthria in structured task 18/20 sentences    Baseline no compensations    Time 4    Period Weeks    Status On-going      SLP SHORT TERM GOAL #3   Title Pt will carryover compensations for dysarthria in simple conversation over 5 minutes to be 95%  intelligible    Baseline 85% intelligible in quiet room    Time 4    Period Weeks    Status On-going            SLP Long Term Goals - 08/24/20 1415      SLP LONG TERM GOAL #1   Title Pt will complete HEP for dysarthria with mod I    Baseline no HEP    Time 8    Period Weeks    Status On-going      SLP LONG TERM GOAL #2   Title Pt will be 95% intelligible over 18 minute conversation with rare min A    Baseline 85% in short conversation    Time 8    Period Weeks    Status On-going      SLP LONG TERM GOAL #3   Title Pt will report 50% reduction (sujectively) of family requesting her to repeat herself over the phone    Baseline no reduction, frequently asked to repeat    Time 8    Period Weeks    Status On-going      SLP LONG TERM GOAL #4   Title Pt will improve score on Communicative Effectiveness Survey by 3 points    Baseline 17 points    Time 8    Period  Weeks    Status On-going            Plan - 08/24/20 1451    Clinical Impression Statement Monica Watson is referred by physician due to speech difficulty s/p CVA 2010. Monica Watson has not received any prior ST. She reports difficulty communicating, especially with strangers, such as MD office, and frustration with communication. She reports she avoids communicating with others, including her sisters and wants to leave family events because of her speech. Monica Watson endorses that her kids frequently ask her to slow down or repeat herself. She has limited social interactions since moving to [place] as her speech and mobility have reduced her ability to form relationships. She denies dysphagia. Today, Monica Watson presents with mild to moderate dysarthria, and is judged to be 85% intelligible in simple conversations in this quiet room. SLP reviewed speech compensations, which pt able to demonstrate at word and sentence level with occasional fading to rare min A.  I recommend skilled ST to maximize intelligiblity for safety, effectively communicate medical information to providers,  to reduce social isolation and  improve QOL.    Speech Therapy Frequency 2x / week    Duration Other (comment)   initial 3, then 2x for 3, then 1x for 3 weeks (12 total)   Treatment/Interventions Environmental controls;Language facilitation;Cueing hierarchy;SLP instruction and feedback;Compensatory strategies;Functional tasks;Compensatory techniques;Cognitive reorganization;Patient/family education;Multimodal communcation approach    Potential to Achieve Goals Good    SLP Home Exercise Plan provided    Consulted and Agree with Plan of Care Patient           Patient will benefit from skilled therapeutic intervention in order to improve the following deficits and impairments:   Dysarthria and anarthria    Problem List Patient Active Problem List   Diagnosis Date Noted  . Cervical cancer screening 08/10/2020  . Bartholin cyst  07/29/2020  . History of total knee replacement, left 06/13/2020  . History of total knee arthroplasty, right 06/13/2020  . Acute right-sided back pain 06/09/2020  . Depression, major, single episode, mild ([place]) 05/12/2020  . Vision changes 12/31/2019  . Bilateral knee pain 10/08/2019  . Poor dentition 10/08/2019  .  Chronic left shoulder pain 08/31/2019  . Hemorrhoids 04/04/2019  . History of stroke 01/01/2019  . Hypertension associated with diabetes ([place]) 01/01/2019  . Pre-diabetes 01/01/2019  . Hot flashes 01/01/2019  . Onychomycosis 01/01/2019    Alinda Deem, MA CCC-SLP 08/24/2020, 2:54 PM  [facility] [address] [place], [place], [address] Phone: [phone]   Fax:  [phone]   Name: Anali Cabanilla MRN: [id number] Date of Birth: 04-27-1956

## 2020-08-24 NOTE — Patient Instructions (Signed)
Access Code: [email or web address] URL: [email or web address] Date: 08/24/2020 Prepared by: Cherly Anderson  Exercises Supine Bridge - 1 x daily - 5 x weekly - 2 sets - 10 reps Clamshell - 1 x daily - 5 x weekly - 2 sets - 10 reps Sit to Stand - 2 x daily - 5 x weekly - 1 sets - 10 reps

## 2020-08-24 NOTE — Therapy (Signed)
[facility] [address] [place], [place], [address] Phone: [phone]   Fax:  [phone]  Physical Therapy Treatment  Patient Details  Name: Monica Watson MRN: [id number] Date of Birth: 01-Oct-1955 Referring Provider (PT): margaret pray   Encounter Date: 08/24/2020   PT End of Session - 08/24/20 1450    Visit Number 2    Number of Visits 9    Authorization Type medicaid 8 visits 3/2-4/26/22    Authorization - Visit Number 1    Authorization - Number of Visits 8    PT Start Time 9563    PT Stop Time 1528    PT Time Calculation (min) 40 min    Equipment Utilized During Treatment Gait belt    Activity Tolerance Patient tolerated treatment well    Behavior During Therapy [facility] for tasks assessed/performed           Past Medical History:  Diagnosis Date  . Cerebrovascular disease   . Hypertension   . Type 2 diabetes mellitus ([place])     Past Surgical History:  Procedure Laterality Date  . THYROIDECTOMY      There were no vitals filed for this visit.   Subjective Assessment - 08/24/20 1451    Subjective Pt reports that she is going to call her doctor to see if she can get another injection in right hip as continues to bother her.    Patient Stated Goals Pt would like to work on her walking.    Currently in Pain? Yes    Pain Score 9     Pain Location Hip    Pain Orientation Right    Pain Type Chronic pain    Pain Radiating Towards to knee    Pain Onset More than a month ago    Pain Frequency Intermittent    Aggravating Factors  sitting too long in car                             [facility] Adult PT Treatment/Exercise - 08/24/20 1452      Transfers   Transfers Sit to Stand;Stand to Sit    Sit to Stand 5: Supervision    Sit to Stand Details Verbal cues for technique    Stand to Sit 5: Supervision      Ambulation/Gait   Ambulation/Gait Yes    Ambulation/Gait Assistance 5:  Supervision;4: Min guard    Ambulation/Gait Assistance Details Pt initially walked in to therapy with quad cane on left side. PT had her switch it and then walked in clinic with much noted improvement in weight shift to left with better step lengths. Pt needed cuing for sequencing to begin but picked up on it quickly. Pt was also cued to keep cane a little more to side so it didn't get too close to her feet. Pt reported leg feeling much better this way.    Ambulation Distance (Feet) 230 Feet    Assistive device Small based quad cane    Gait Pattern Step-through pattern;Decreased step length - right;Decreased step length - left;Narrow base of support    Ambulation Surface Level;Indoor      Neuro Re-ed    Neuro Re-ed Details  Sit to stand x 10 with 2" step under RLE to increase left weight shift. Pt reported a little pain in right knee but did shift weight more to left. In // bars: standing on rockerboard positioned lateral maintaining level x  30 sec then with trying to rock board back and forth x 10 with verbal and tactile cues to tighten quad and glut to stance side to help. Pt reported some pain in right hip towards end with weight shift on right.      Exercises   Exercises Other Exercises    Other Exercises  Bridges x 10 with verbal cues to tighten tummy to prevent arching back, right sidelying for left clamshell x 10 with verbal and tactile cues for form. Pt has some shaking with eccentric lowering.                  PT Education - 08/24/20 1545    Education Details Started on initial HEP. Pt instructed to use cane on right side.    Person(s) Educated Patient    Methods Explanation;Demonstration;Handout    Comprehension Verbalized understanding;Returned demonstration            PT Short Term Goals - 08/18/20 0829      PT SHORT TERM GOAL #1   Title Pt will be able to perform initial HEP for strengthening and balance on own.    Baseline no current HEP    Time 4    Period Weeks     Status New    Target Date 09/17/20      PT SHORT TERM GOAL #2   Title Pt will decrease 5 x sit to stand from 17.21 sec to <15 sec for improved balance and functional strength.    Baseline 08/17/20 17.21 sec from chair thoughout hands    Time 4    Period Weeks    Status New    Target Date 09/17/20      PT SHORT TERM GOAL #3   Title Pt will increase gait speed from 0.68m/s to >0.22m/s for improved household mobility.    Baseline 0.33m/s    Time 4    Period Weeks    Status New    Target Date 09/17/20      PT SHORT TERM GOAL #4   Title Pt will be able to ambulate >300' with [facility] mod I for improved household and short community distances.    Baseline 100' supervision with [facility]    Time 4    Period Weeks    Status New    Target Date 09/17/20             PT Long Term Goals - 08/18/20 1255      PT LONG TERM GOAL #1   Title Pt will be independent with progressive HEP for strengthening and balance to continue gains on own.    Baseline no current HEP    Time 8    Period Weeks    Status New    Target Date 10/17/20      PT LONG TERM GOAL #2   Title Pt will decrease TUG from 25.26 sec to <20 sec with quad cane for improved balance and decreased fall risk.    Baseline 25.26 sec with SBQC    Time 8    Period Weeks    Status New    Target Date 10/17/20      PT LONG TERM GOAL #3   Title Pt will increase gait speed from 0.81m/s to >0.72m/s for improved mobility.    Baseline 0.59m/s    Time 8    Period Weeks    Status New    Target Date 10/17/20      PT LONG TERM GOAL #4  Title Pt will ambulate 500' on varied surfaces with SBQC on varied surfaces mod I.    Baseline 100' supervision with [facility] on level    Time 8    Period Weeks    Status New    Target Date 10/17/20      PT LONG TERM GOAL #5   Title Pt will ambulate >200' without AD on level surfaces for improved household mobility.    Baseline 100' SBQC supervision on level    Time 8    Period Weeks    Status New     Target Date 10/17/20                 Plan - 08/24/20 1545    Clinical Impression Statement Pt was initially using cane on left side so had her switch to right and gait quality much improved with better left weight shift. Pt reported it felt better although different. Started on initial HEP for left hip strengthening.    Personal Factors and Comorbidities Time since onset of injury/illness/exacerbation;Comorbidity 3+    Comorbidities HTN, DM, arthritis    Examination-Activity Limitations Locomotion Level;Transfers;Stairs    Examination-Participation Restrictions Community Activity;Cleaning    Stability/Clinical Decision Making Evolving/Moderate complexity    Rehab Potential Good    PT Frequency 1x / week   plus eval   PT Duration 8 weeks    PT Treatment/Interventions ADLs/Self Care Home Management;Aquatic Therapy;DME Instruction;Gait training;Stair training;Functional mobility training;Therapeutic activities;Therapeutic exercise;Balance training;Neuromuscular re-education;Manual techniques;Patient/family education;Vestibular    PT Next Visit Plan How is initial HEP going?  Balance training. Gait with SBQC on right and without as able.    Consulted and Agree with Plan of Care Patient           Patient will benefit from skilled therapeutic intervention in order to improve the following deficits and impairments:  Abnormal gait,Decreased balance,Decreased mobility,Decreased strength,Impaired sensation,Decreased endurance,Pain  Visit Diagnosis: Other abnormalities of gait and mobility  Muscle weakness (generalized)  Hemiplegia and hemiparesis following cerebral infarction affecting left dominant side [facility])     Problem List Patient Active Problem List   Diagnosis Date Noted  . Cervical cancer screening 08/10/2020  . Bartholin cyst 07/29/2020  . History of total knee replacement, left 06/13/2020  . History of total knee arthroplasty, right 06/13/2020  . Acute right-sided back  pain 06/09/2020  . Depression, major, single episode, mild ([place]) 05/12/2020  . Vision changes 12/31/2019  . Bilateral knee pain 10/08/2019  . Poor dentition 10/08/2019  . Chronic left shoulder pain 08/31/2019  . Hemorrhoids 04/04/2019  . History of stroke 01/01/2019  . Hypertension associated with diabetes ([place]) 01/01/2019  . Pre-diabetes 01/01/2019  . Hot flashes 01/01/2019  . Onychomycosis 01/01/2019    Electa Sniff, PT, DPT, NCS 08/24/2020, 3:48 PM  [facility] [address] [place], [place], [address] Phone: [phone]   Fax:  [phone]  Name: Lianah Peed MRN: [id number] Date of Birth: [birth year]

## 2020-08-31 ENCOUNTER — Ambulatory Visit: Payer: Medicaid Other

## 2020-09-07 ENCOUNTER — Ambulatory Visit: Payer: Medicaid Other

## 2020-09-09 ENCOUNTER — Ambulatory Visit (INDEPENDENT_AMBULATORY_CARE_PROVIDER_SITE_OTHER): Payer: Medicaid Other | Admitting: Orthopedic Surgery

## 2020-09-09 ENCOUNTER — Other Ambulatory Visit: Payer: Self-pay

## 2020-09-09 ENCOUNTER — Encounter: Payer: Self-pay | Admitting: Orthopedic Surgery

## 2020-09-09 DIAGNOSIS — M79604 Pain in right leg: Secondary | ICD-10-CM | POA: Diagnosis not present

## 2020-09-09 NOTE — Progress Notes (Signed)
Office Visit Note   Patient: Monica Watson           Date of Birth: February 07, 1956           MRN: [id number] Visit Date: 09/09/2020 Requested by: Sharion Settler, DO [place],  [place] [address] PCP: Sharion Settler, DO  Subjective: Chief Complaint  Patient presents with  . Other     Right leg pain    HPI: Monica Watson is a 65 year old patient with bilateral knee replacements and some laxity in the right knee which may need a bigger spacer.  Patient had MRI scan ordered January 5 of her lumbar spine.  She canceled that on January 16.  She has been to therapy for 2 weeks.  She is on Neurontin 3 times a day.  Also on Percocet from pain management.  Initially she was sent over for evaluation of her knee pain and appropriateness for revision.  Of seeing her multiple occasions and she has a little bit of right hip arthritis as well as back pain as well as the spacer which needs to be changed on the right total knee replacement.  She has not been back To see Dr. Ninfa Linden to discuss these recommendations.  She is having some radicular pain as well.              ROS: All systems reviewed are negative as they relate to the chief complaint within the history of present illness.  Patient denies  fevers or chills.   Assessment & Plan: Visit Diagnoses:  1. Pain in right leg     Plan: Impression is right sided radiculopathy with knee replacement that has some looseness in flexion.  I think she does need a revision to a bigger spacer.  Regarding all these other symptoms she has had a right hip injection for moderate hip arthritis which gave her several days of relief.  I think she is having some back mediated symptoms as well.  All of her pain symptoms will be magnified due to the amount of pain medication that she takes.  Plan MRI L-spine to be reordered.  Follow-up with Dr. Ninfa Linden to schedule revision spacer exchange on the right-hand side and likely follow-up with Dr. Ernestina Patches after  that.  I will see her back as needed  Follow-Up Instructions: No follow-ups on file.   Orders:  Orders Placed This Encounter  Procedures  . MR Lumbar Spine w/o contrast   No orders of the defined types were placed in this encounter.     Procedures: No procedures performed   Clinical Data: No additional findings.  Objective: Vital Signs: There were no vitals taken for this visit.  Physical Exam:   Constitutional: Patient appears well-developed HEENT:  Head: Normocephalic Eyes:EOM are normal Neck: Normal range of motion Cardiovascular: Normal rate Pulmonary/chest: Effort normal Neurologic: Patient is alert Skin: Skin is warm Psychiatric: Patient has normal mood and affect    Ortho Exam: Ortho exam demonstrates flexion laxity in the right knee.  No effusion in either knee.  Left knee actually looks pretty good with range of motion.  No nerve root tension signs today.  Patient does have a little bit of groin pain on the left and right-hand side with internal/external rotation of the leg.  No other masses lymphadenopathy or skin changes noted in that knee region.  She does have some pain with forward lateral bending but no paresthesias L1 S1 bilaterally.  Specialty Comments:  No specialty comments available.  Imaging:  No results found.   PMFS History: Patient Active Problem List   Diagnosis Date Noted  . Cervical cancer screening 08/10/2020  . Bartholin cyst 07/29/2020  . History of total knee replacement, left 06/13/2020  . History of total knee arthroplasty, right 06/13/2020  . Acute right-sided back pain 06/09/2020  . Depression, major, single episode, mild ([place]) 05/12/2020  . Vision changes 12/31/2019  . Bilateral knee pain 10/08/2019  . Poor dentition 10/08/2019  . Chronic left shoulder pain 08/31/2019  . Hemorrhoids 04/04/2019  . History of stroke 01/01/2019  . Hypertension associated with diabetes ([place]) 01/01/2019  . Pre-diabetes 01/01/2019  . Hot  flashes 01/01/2019  . Onychomycosis 01/01/2019   Past Medical History:  Diagnosis Date  . Cerebrovascular disease   . Hypertension   . Type 2 diabetes mellitus ([place])     History reviewed. No pertinent family history.  Past Surgical History:  Procedure Laterality Date  . THYROIDECTOMY     Social History   Occupational History  . Not on file  Tobacco Use  . Smoking status: Current Every Day Smoker  . Smokeless tobacco: Never Used  Substance and Sexual Activity  . Alcohol use: Never  . Drug use: Never  . Sexual activity: Not on file

## 2020-09-12 ENCOUNTER — Telehealth: Payer: Self-pay

## 2020-09-12 NOTE — Telephone Encounter (Signed)
Patient calls nurse line regarding PCS form. Patient states that this form was faxed last week. I am unable to find this in provider's box. Patient is unsure which agency she is working with. Patient will talk with son and call back once she can confirm agency.    -Patient called back to nurse line and states that agency is [place].   Dr. Nita Sells- have you received this form or do we need to obtain another copy?

## 2020-09-12 NOTE — Telephone Encounter (Signed)
I received the form and have it filled out. I am not sure if it is supposed to be faxed or if patient is wanting to pick it up. I have placed it back in my box.   Thanks!

## 2020-09-13 ENCOUNTER — Ambulatory Visit: Payer: Medicaid Other

## 2020-09-13 ENCOUNTER — Other Ambulatory Visit: Payer: Self-pay

## 2020-09-13 DIAGNOSIS — M6281 Muscle weakness (generalized): Secondary | ICD-10-CM

## 2020-09-13 DIAGNOSIS — R2689 Other abnormalities of gait and mobility: Secondary | ICD-10-CM | POA: Diagnosis not present

## 2020-09-13 DIAGNOSIS — R471 Dysarthria and anarthria: Secondary | ICD-10-CM

## 2020-09-13 NOTE — Therapy (Signed)
[facility] [address] [place], [place], [address] Phone: [phone]   Fax:  [phone]  Physical Therapy Treatment  Patient Details  Name: Monica Watson MRN: [id number] Date of Birth: [birth year] Referring Provider (PT): margaret pray   Encounter Date: 09/13/2020   PT End of Session - 09/13/20 1406    Visit Number 3    Number of Visits 9    Authorization Type medicaid 8 visits 3/2-4/26/22    Authorization - Visit Number 2    Authorization - Number of Visits 8    PT Start Time 1413    PT Stop Time 1448    PT Time Calculation (min) 35 min    Equipment Utilized During Treatment Gait belt    Activity Tolerance Patient tolerated treatment well    Behavior During Therapy [facility] for tasks assessed/performed           Past Medical History:  Diagnosis Date  . Cerebrovascular disease   . Hypertension   . Type 2 diabetes mellitus ([place])     Past Surgical History:  Procedure Laterality Date  . THYROIDECTOMY      There were no vitals filed for this visit.   Subjective Assessment - 09/13/20 1408    Subjective Pt reports that right hip is bothering her still. She saw doctor and they scheduled MRI for Sunday 4/3 for lumbar spine. They also said that her right knee replacement may be loosening and needs new spacer and will be following up with Dr. Ninfa Linden about that.    Patient Stated Goals Pt would like to work on her walking.    Currently in Pain? Yes    Pain Score 10-Worst pain ever    Pain Location Hip    Pain Orientation Right    Pain Descriptors / Indicators Aching    Pain Type Chronic pain    Pain Onset More than a month ago    Pain Frequency Intermittent                             [facility] Adult PT Treatment/Exercise - 09/13/20 1422      Ambulation/Gait   Ambulation/Gait Yes    Ambulation/Gait Assistance 5: Supervision    Ambulation/Gait Assistance Details Pt was cued again to switch  cane to right side with improvement in upright posture and left weight shift. Pt reports increased pain in right low back as went on.    Ambulation Distance (Feet) 230 Feet    Assistive device Small based quad cane    Gait Pattern Step-through pattern;Decreased step length - right;Decreased step length - left    Ambulation Surface Level;Indoor      Manual Therapy   Manual Therapy Soft tissue mobilization    Manual therapy comments PT assessed right hip/low back motion: pt was painful at end range flexion passively in supine in bottom with no radicular symptoms, no pain with hip scouring, negative right SLR. In sidelying pt was noted to be tender to palpation lower lumbar spine and on right. Trigger points in right paraspinals and quadratus lumborum area. PT performed STM and trigger point release to this area in left sidelying with foam roll under left side to also help to stretch out right lateral trunk. Had pt put right hand overhead as well. PT added pelvic depression with stretch 30 sec x 2.  Pain in back down to 6/10 after manual techniques. Pt was also instructed in seated  lateral trunk stretch.                  PT Education - 09/13/20 1607    Education Details Discussed seated lateral trunk stretch bringing arm overhead and leaning to the side as well as performing with sidelying stretch with pillow folded under left side. To stretch 30 sec x 3.    Person(s) Educated Patient    Methods Explanation;Demonstration    Comprehension Verbalized understanding;Returned demonstration            PT Short Term Goals - 08/18/20 0829      PT SHORT TERM GOAL #1   Title Pt will be able to perform initial HEP for strengthening and balance on own.    Baseline no current HEP    Time 4    Period Weeks    Status New    Target Date 09/17/20      PT SHORT TERM GOAL #2   Title Pt will decrease 5 x sit to stand from 17.21 sec to <15 sec for improved balance and functional strength.     Baseline 08/17/20 17.21 sec from chair thoughout hands    Time 4    Period Weeks    Status New    Target Date 09/17/20      PT SHORT TERM GOAL #3   Title Pt will increase gait speed from 0.12m/s to >0.47m/s for improved household mobility.    Baseline 0.71m/s    Time 4    Period Weeks    Status New    Target Date 09/17/20      PT SHORT TERM GOAL #4   Title Pt will be able to ambulate >300' with [facility] mod I for improved household and short community distances.    Baseline 100' supervision with [facility]    Time 4    Period Weeks    Status New    Target Date 09/17/20             PT Long Term Goals - 08/18/20 1255      PT LONG TERM GOAL #1   Title Pt will be independent with progressive HEP for strengthening and balance to continue gains on own.    Baseline no current HEP    Time 8    Period Weeks    Status New    Target Date 10/17/20      PT LONG TERM GOAL #2   Title Pt will decrease TUG from 25.26 sec to <20 sec with quad cane for improved balance and decreased fall risk.    Baseline 25.26 sec with SBQC    Time 8    Period Weeks    Status New    Target Date 10/17/20      PT LONG TERM GOAL #3   Title Pt will increase gait speed from 0.87m/s to >0.59m/s for improved mobility.    Baseline 0.99m/s    Time 8    Period Weeks    Status New    Target Date 10/17/20      PT LONG TERM GOAL #4   Title Pt will ambulate 500' on varied surfaces with SBQC on varied surfaces mod I.    Baseline 100' supervision with [facility] on level    Time 8    Period Weeks    Status New    Target Date 10/17/20      PT LONG TERM GOAL #5   Title Pt will ambulate >200' without AD on level surfaces  for improved household mobility.    Baseline 100' SBQC supervision on level    Time 8    Period Weeks    Status New    Target Date 10/17/20                 Plan - 09/13/20 1612    Clinical Impression Statement Pt was having more right low back/hip pain today so worked on some manual techniques  and stretching with improvement in pain at end.    Personal Factors and Comorbidities Time since onset of injury/illness/exacerbation;Comorbidity 3+    Comorbidities HTN, DM, arthritis    Examination-Activity Limitations Locomotion Level;Transfers;Stairs    Examination-Participation Restrictions Community Activity;Cleaning    Stability/Clinical Decision Making Evolving/Moderate complexity    Rehab Potential Good    PT Frequency 1x / week   plus eval   PT Duration 8 weeks    PT Treatment/Interventions ADLs/Self Care Home Management;Aquatic Therapy;DME Instruction;Gait training;Stair training;Functional mobility training;Therapeutic activities;Therapeutic exercise;Balance training;Neuromuscular re-education;Manual techniques;Patient/family education;Vestibular    PT Next Visit Plan How is pain doing? How is new stretch (seated lateral trunk stretch bringing arm overhead and leaning to side and sidelying over pillow with arm overhead). Check STGs.  Balance training. Gait with SBQC on right and without as able.    Consulted and Agree with Plan of Care Patient           Patient will benefit from skilled therapeutic intervention in order to improve the following deficits and impairments:  Abnormal gait,Decreased balance,Decreased mobility,Decreased strength,Impaired sensation,Decreased endurance,Pain  Visit Diagnosis: Other abnormalities of gait and mobility  Muscle weakness (generalized)     Problem List Patient Active Problem List   Diagnosis Date Noted  . Cervical cancer screening 08/10/2020  . Bartholin cyst 07/29/2020  . History of total knee replacement, left 06/13/2020  . History of total knee arthroplasty, right 06/13/2020  . Acute right-sided back pain 06/09/2020  . Depression, major, single episode, mild ([place]) 05/12/2020  . Vision changes 12/31/2019  . Bilateral knee pain 10/08/2019  . Poor dentition 10/08/2019  . Chronic left shoulder pain 08/31/2019  . Hemorrhoids  04/04/2019  . History of stroke 01/01/2019  . Hypertension associated with diabetes ([place]) 01/01/2019  . Pre-diabetes 01/01/2019  . Hot flashes 01/01/2019  . Onychomycosis 01/01/2019    Electa Sniff, PT, DPT, NCS 09/13/2020, 4:15 PM  [facility] [address] [place], [place], [address] Phone: [phone]   Fax:  [phone]  Name: Monica Watson MRN: [id number] Date of Birth: [birth year]

## 2020-09-13 NOTE — Therapy (Signed)
[facility] [address] [place], [place], [address] Phone: [phone]   Fax:  [phone]  Speech Language Pathology Treatment  Patient Details  Name: Monica Watson MRN: [id number] Date of Birth: [birth year] Referring Provider (SLP): Dr. Yehuda Savannah   Encounter Date: 09/13/2020   End of Session - 09/13/20 1432    Visit Number 3    Number of Visits 13    Date for SLP Re-Evaluation 09/28/20    Authorization Type pending medicaid auth    SLP Start Time 1449    SLP Stop Time  1530    SLP Time Calculation (min) 41 min    Activity Tolerance Patient tolerated treatment well           Past Medical History:  Diagnosis Date  . Cerebrovascular disease   . Hypertension   . Type 2 diabetes mellitus ([place])     Past Surgical History:  Procedure Laterality Date  . THYROIDECTOMY      There were no vitals filed for this visit.   Subjective Assessment - 09/13/20 1439    Subjective "it's a lot better" re: speech    Currently in Pain? Yes    Pain Score 6     Pain Location Hip    Pain Orientation Right    Pain Descriptors / Indicators Aching    Pain Type Chronic pain    Pain Onset More than a month ago                 ADULT SLP TREATMENT - 09/13/20 1433      General Information   Behavior/Cognition Alert;Cooperative;Pleasant mood      Treatment Provided   Treatment provided Cognitive-Linquistic      Cognitive-Linquistic Treatment   Treatment focused on Dysarthria;Patient/family/caregiver education    Skilled Treatment Pt reports improvements in speech, with pt now conversing with neighbors and son more frequently. Pt reported she completed HEP 2-3x/day. SLP reviewed HEP and dysarthria compensations, including slow rate and over-ennunication. Pt demo'd speech compensations at sentence levels with 90% intelligibility with occasional min A to increase volume and clarity. Pt able to self-correct speech  errors with improved accuracy this session. Occasional verbal and non-verbal cues required to slow rate of speech in conversation. Pt rated ~85% intelligible in conversation, which improved to 95% intelligibile with cues.      Assessment / Recommendations / Plan   Plan Continue with current plan of care      Progression Toward Goals   Progression toward goals Progressing toward goals            SLP Education - 09/13/20 1835    Education Details SLOP, conversational starters practice    Person(s) Educated Patient    Methods Explanation;Handout;Demonstration    Comprehension Verbalized understanding;Returned demonstration;Need further instruction            SLP Short Term Goals - 09/13/20 1431      SLP SHORT TERM GOAL #1   Title Pt will complete HEP for dysarthira with rare min A    Baseline --    Time 3    Period Weeks    Status On-going      SLP SHORT TERM GOAL #2   Title Pt will carryover compensations for dysarthria in structured task 18/20 sentences    Baseline 09-13-20    Time 3    Period Weeks    Status On-going      SLP SHORT TERM GOAL #3   Title Pt  will carryover compensations for dysarthria in simple conversation over 5 minutes to be 95% intelligible    Baseline --    Time 3    Period Weeks    Status On-going            SLP Long Term Goals - 09/13/20 1431      SLP LONG TERM GOAL #1   Title Pt will complete HEP for dysarthria with mod I    Baseline --    Time 7    Period Weeks    Status On-going      SLP LONG TERM GOAL #2   Title Pt will be 95% intelligible over 18 minute conversation with rare min A    Baseline --    Time 7    Period Weeks    Status On-going      SLP LONG TERM GOAL #3   Title Pt will report 50% reduction (sujectively) of family requesting her to repeat herself over the phone    Baseline --    Time 7    Period Weeks    Status On-going      SLP LONG TERM GOAL #4   Title Pt will improve score on Communicative Effectiveness  Survey by 3 points    Baseline 17 points    Time 7    Period Weeks    Status On-going            Plan - 09/13/20 1434    Clinical Impression Statement Monica Watson is referred by physician due to speech difficulty s/p CVA 2010.  Pt reports improvements in speech since last ST session, with pt demonstrating improved rate and volume. Pt is judged to be 85% intelligible in simple conversations upon entrance, which improved to 95% intelligible with cues. SLP reviewed speech compensations, which pt able to demonstrate at sentence level with occasional min A.  SLP recommends skilled ST to maximize intelligiblity for safety, effectively communicate medical information to providers,  to reduce social isolation and  improve QOL.    Speech Therapy Frequency 2x / week    Duration Other (comment)   initial 3, then 2x for 3, then 1x for 3 weeks (12 total)   Treatment/Interventions Environmental controls;Language facilitation;Cueing hierarchy;SLP instruction and feedback;Compensatory strategies;Functional tasks;Compensatory techniques;Cognitive reorganization;Patient/family education;Multimodal communcation approach    SLP Home Exercise Plan provided    Consulted and Agree with Plan of Care Patient           Patient will benefit from skilled therapeutic intervention in order to improve the following deficits and impairments:   Dysarthria and anarthria    Problem List Patient Active Problem List   Diagnosis Date Noted  . Cervical cancer screening 08/10/2020  . Bartholin cyst 07/29/2020  . History of total knee replacement, left 06/13/2020  . History of total knee arthroplasty, right 06/13/2020  . Acute right-sided back pain 06/09/2020  . Depression, major, single episode, mild ([facility]) 05/12/2020  . Vision changes 12/31/2019  . Bilateral knee pain 10/08/2019  . Poor dentition 10/08/2019  . Chronic left shoulder pain 08/31/2019  . Hemorrhoids 04/04/2019  . History of stroke  01/01/2019  . Hypertension associated with diabetes ([place]) 01/01/2019  . Pre-diabetes 01/01/2019  . Hot flashes 01/01/2019  . Onychomycosis 01/01/2019    Alinda Deem, MA CCC-SLP 09/13/2020, 6:37 PM  [facility] [address] [place], [place], [address] Phone: [phone]   Fax:  [phone]   Name: Monica Watson MRN: [id number]  Date of Birth: 13-Apr-1956

## 2020-09-15 ENCOUNTER — Ambulatory Visit: Payer: Medicaid Other

## 2020-09-19 NOTE — Telephone Encounter (Signed)
Late documentation. I spoke with Jarrett Soho and this was faxed last week. Patient updated.

## 2020-09-19 NOTE — Telephone Encounter (Signed)
Form faxed to [facility], to number provided at the top of form. Copy made and placed in batch scanning.   *Delay in documentation, form faxed on 3/21. Talbot Grumbling, RN

## 2020-09-20 ENCOUNTER — Ambulatory Visit: Payer: Medicaid Other

## 2020-09-20 ENCOUNTER — Ambulatory Visit: Payer: Medicaid Other | Admitting: Physical Therapy

## 2020-09-22 ENCOUNTER — Ambulatory Visit: Payer: Medicaid Other

## 2020-09-25 ENCOUNTER — Ambulatory Visit
Admission: RE | Admit: 2020-09-25 | Discharge: 2020-09-25 | Disposition: A | Discharge status: Home / Self Care | Payer: Medicaid Other | Source: Ambulatory Visit | Attending: Orthopedic Surgery | Admitting: Orthopedic Surgery

## 2020-09-25 ENCOUNTER — Other Ambulatory Visit: Payer: Self-pay

## 2020-09-25 DIAGNOSIS — M79604 Pain in right leg: Secondary | ICD-10-CM

## 2020-09-28 ENCOUNTER — Ambulatory Visit: Payer: Medicaid Other

## 2020-09-29 ENCOUNTER — Ambulatory Visit (INDEPENDENT_AMBULATORY_CARE_PROVIDER_SITE_OTHER): Payer: Medicaid Other | Admitting: Orthopaedic Surgery

## 2020-09-29 ENCOUNTER — Other Ambulatory Visit: Payer: Self-pay

## 2020-09-29 ENCOUNTER — Encounter: Payer: Self-pay | Admitting: Orthopaedic Surgery

## 2020-09-29 ENCOUNTER — Other Ambulatory Visit: Payer: Self-pay | Admitting: Family Medicine

## 2020-09-29 DIAGNOSIS — M25561 Pain in right knee: Secondary | ICD-10-CM

## 2020-09-29 DIAGNOSIS — T84062D Wear of articular bearing surface of internal prosthetic right knee joint, subsequent encounter: Secondary | ICD-10-CM

## 2020-09-29 DIAGNOSIS — G8929 Other chronic pain: Secondary | ICD-10-CM

## 2020-09-29 DIAGNOSIS — Z96651 Presence of right artificial knee joint: Secondary | ICD-10-CM | POA: Diagnosis not present

## 2020-09-29 DIAGNOSIS — M545 Low back pain, unspecified: Secondary | ICD-10-CM

## 2020-09-29 NOTE — Progress Notes (Signed)
The patient is following up after having an MRI of her lumbar spine ordered by my partner Dr. Marlou Sa due to significant right-sided low back pain.  We have also followed her right hip but mainly both her knees.  Her knees were replaced at separate times with press-fit implants done elsewhere.  We have been concerned about instability of the right knee on exam.  The right hip steroid injection is done well for her.  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  On my exam today most of her pain is in the low back to the right side but does not have a radicular component.  Her right hip moves smoothly and fluidly.  The right knee does feel ligamentously unstable on exam.  There is no effusion with the right knee.  The MRI of her lumbar spine does show multifactorial degenerative changes and stenosis at L4-L5 bilaterally.  I think most of her symptoms of her low back right now related to facet arthritis at that level.  I did review the x-rays of her right knee and it shows a well-seated [employer] press-fit knee arthroplasty.  From a spine standpoint, I would like to send her to Dr. Ernestina Patches for a facet joint injection to the right-sided L4-L5.  From the standpoint of her total knee on the right side, I do feel that she needs upsizing of her polyethylene liner due to inherent instability of the knee on exam.  She is in agreement of this with proceeding with trying to stabilize her right knee due to the instability that knee.  She has remote history of a stroke affecting her left side and she needs some more strength and stability on the right knee I do feel that this is the most appropriate medical treatment and the next treatment we should try for her right knee.  I did explain what the surgery involves as well as a thorough discussion of the risk and benefits of a polyliner exchange of the right knee.  All question concerns were answered and addressed.  We  will work on getting that surgery scheduled.

## 2020-09-30 ENCOUNTER — Telehealth: Payer: Self-pay

## 2020-09-30 NOTE — Therapy (Addendum)
[facility] [address] [place], [place], [address] Phone: [phone]   Fax:  [phone]  Patient Details  Name: Monica Watson MRN: [id number] Date of Birth: 12/11/1955 Referring Provider: Lenoria Chime, MD  Encounter Date: 09/30/2020  SPEECH THERAPY DISCHARGE SUMMARY  Visits from Start of Care: 3  Current functional level related to goals / functional outcomes: Pt has not returned since 09/13/20, despite SLP contacting patient via telephone encouraging patient to attend therapy sessions. Pt is currently experiencing transportation and physical limitations, with pt request for discharge from PT/ST at this time due to inability to come to therapy consistently and upcoming scheduled surgery. Pt made limited progress in ST services due to reduced number of visits; however, improvements in dysarthria reported and demonstrated.   Remaining deficits: Dysarthria   Education / Equipment: Compensations, HEP  SLP Short Term Goals - 09/13/20 1431              SLP SHORT TERM GOAL #1    Title Pt will complete HEP for dysarthira with rare min A     Baseline --     Time 3     Period Weeks     Status On-going          SLP SHORT TERM GOAL #2    Title Pt will carryover compensations for dysarthria in structured task 18/20 sentences     Baseline 09-13-20     Time 3     Period Weeks     Status On-going          SLP SHORT TERM GOAL #3    Title Pt will carryover compensations for dysarthria in simple conversation over 5 minutes to be 95% intelligible     Baseline --     Time 3     Period Weeks     Status On-going                  SLP Long Term Goals - 09/13/20 1431              SLP LONG TERM GOAL #1    Title Pt will complete HEP for dysarthria with mod I     Baseline --     Time 7     Period Weeks     Status On-going          SLP LONG TERM GOAL #2    Title Pt will be 95%  intelligible over 18 minute conversation with rare min A     Baseline --     Time 7     Period Weeks     Status On-going          SLP LONG TERM GOAL #3    Title Pt will report 50% reduction (sujectively) of family requesting her to repeat herself over the phone     Baseline --     Time 7     Period Weeks     Status On-going          SLP LONG TERM GOAL #4    Title Pt will improve score on Communicative Effectiveness Survey by 3 points     Baseline 17 points     Time 7     Period Weeks     Status On-going         Plan: Patient agrees to discharge.  Patient goals were not met. Patient is being discharged due to not returning since the last visit.  ?????  Alinda Deem, MA CCC-SLP 09/30/2020, 4:08 PM  [facility] [address] [place], [place], [address] Phone: [phone]   Fax:  [phone]

## 2020-09-30 NOTE — Telephone Encounter (Signed)
PT returned call from pt. She saw orthopedic doctor after her MRI on lumbar spine and they decided best to just do injection in back at this time as she really needs components replaced in right knee as in not stable. Surgery hopefully to be scheduled in next couple weeks. PT will discharge at this time and notify ST, Belenda Cruise to do so as well to conserve visits for after her upcoming surgery. Pt in agreement. Cherly Anderson, PT, DPT, NCS

## 2020-09-30 NOTE — Therapy (Signed)
[facility] [address] [place], [place], [address] Phone: [phone]   Fax:  [phone]  Patient Details  Name: Monica Watson MRN: [id number] Date of Birth: 1955/08/16 Referring Provider:  No ref. provider found  Encounter Date: 09/30/2020  PHYSICAL THERAPY DISCHARGE SUMMARY  Visits from Start of Care: 3  Current functional level related to goals / functional outcomes: Unknown as pt has not returned since 09/13/20. PT spoke with pt and she reports she is waiting to get scheduled for right knee revision surgery and getting injection in back. PT will discharge to conserve visits until after her knee surgery as will need more therapy at that time. Pt in agreement.   Remaining deficits: Left hemiparesis, right low back pain and knee pain   Education / Equipment: Initial HEP  Plan: Patient agrees to discharge.  Patient goals were not met. Patient is being discharged due to a change in medical status.  ?????         Electa Sniff, PT, DPT, NCS 09/30/2020, 3:12 PM  [facility] [address] [place], [place], [address] Phone: [phone]   Fax:  [phone]

## 2020-10-04 ENCOUNTER — Other Ambulatory Visit: Payer: Self-pay

## 2020-10-05 ENCOUNTER — Ambulatory Visit: Payer: Medicaid Other

## 2020-10-05 ENCOUNTER — Ambulatory Visit: Payer: Medicaid Other | Admitting: Physical Therapy

## 2020-10-10 NOTE — Telephone Encounter (Signed)
Patient called stating that [facility] never received the fax from March.  Form printed from [employer] and refaxed.  Patient voiced understanding and appreciation. Milliani Herrada,CMA

## 2020-10-12 ENCOUNTER — Ambulatory Visit: Payer: Medicaid Other

## 2020-10-13 NOTE — Progress Notes (Deleted)
   Subjective:   Patient ID: Monica Watson    DOB: April 29, 1956, 65 y.o. female   MRN: [id number]  Monica Watson is a 65 y.o. female with a history of hemorrhoids, hot flashes, HTN, poor dentition, onychomycosis, bartholin cyst, bilateral knee pain, chronic left shoulder pain, depression, h/o stroke, h/o bilateral knee surgeries, prediabetes, vision changes here for abdominal pain  ABDOMINAL PAIN Pain began ***  Medications tried: *** Similar pain before:***  Symptoms Nausea/vomiting: *** Diarrhea: *** Constipation: *** Blood in stool: *** Blood in vomit: *** Fever: *** Dysuria: *** Loss of appetite: *** Weight loss: ***  Vaginal Bleeding: *** Missed menstrual period: ***   {Associated Symptoms:25148} {Patient Denies:25149}   Review of Systems:  Per HPI.   Objective:   There were no vitals taken for this visit. Vitals and nursing note reviewed.  General: pleasant ***, sitting comfortably in exam chair, well nourished, well developed, in no acute distress with non-toxic appearance HEENT: normocephalic, atraumatic, moist mucous membranes, oropharynx clear without erythema or exudate, TM normal bilaterally  Neck: supple, non-tender without lymphadenopathy CV: regular rate and rhythm without murmurs, rubs, or gallops, no lower extremity edema, 2+ radial and pedal pulses bilaterally Lungs: clear to auscultation bilaterally with normal work of breathing on room air Resp: breathing comfortably on room air, speaking in full sentences Abdomen: soft, non-tender, non-distended, no masses or organomegaly palpable, normoactive bowel sounds Skin: warm, dry, no rashes or lesions Extremities: warm and well perfused, normal tone MSK: ROM grossly intact, strength intact, gait normal Neuro: Alert and oriented, speech normal  Assessment & Plan:   No problem-specific Assessment & Plan notes found for this encounter.  No orders of the defined types were placed in this encounter.  No  orders of the defined types were placed in this encounter.   {    This will disappear when note is signed, click to select method of visit    :1}  Monica Marble, DO PGY-3, [facility] 10/13/2020 8:49 PM

## 2020-10-14 ENCOUNTER — Ambulatory Visit: Payer: Medicaid Other | Admitting: Family Medicine

## 2020-10-14 ENCOUNTER — Ambulatory Visit: Payer: Medicaid Other

## 2020-10-16 ENCOUNTER — Other Ambulatory Visit: Payer: Self-pay | Admitting: Family Medicine

## 2020-10-17 ENCOUNTER — Ambulatory Visit: Payer: Self-pay

## 2020-10-17 ENCOUNTER — Encounter: Payer: Self-pay | Admitting: Physical Medicine and Rehabilitation

## 2020-10-17 ENCOUNTER — Other Ambulatory Visit: Payer: Self-pay

## 2020-10-17 ENCOUNTER — Ambulatory Visit (INDEPENDENT_AMBULATORY_CARE_PROVIDER_SITE_OTHER): Payer: Medicaid Other | Admitting: Physical Medicine and Rehabilitation

## 2020-10-17 VITALS — BP 109/74 | HR 80

## 2020-10-17 DIAGNOSIS — M47816 Spondylosis without myelopathy or radiculopathy, lumbar region: Secondary | ICD-10-CM

## 2020-10-17 MED ORDER — BETAMETHASONE SOD PHOS & ACET 6 (3-3) MG/ML IJ SUSP
6.0000 mg | Freq: Once | INTRAMUSCULAR | Status: AC
  Administered 2020-10-17: 6 mg

## 2020-10-17 NOTE — Progress Notes (Signed)
Pt state lower back pain,mostly her right hip area. Pt state walking, standing and laying down makes the pain worse. Pt state she take her pain meds to help ease the pain.  Numeric Pain Rating Scale and Functional Assessment Average Pain 9   In the last MONTH (on 0-10 scale) has pain interfered with the following?  1. General activity like being  able to carry out your everyday physical activities such as walking, climbing stairs, carrying groceries, or moving a chair?  Rating(9)   +Driver, -BT, -Dye Allergies.

## 2020-10-17 NOTE — Progress Notes (Signed)
Monica Watson - 65 y.o. female MRN [id number]  Date of birth: 1955/10/27  Office Visit Note: Visit Date: 10/17/2020 PCP: Sharion Settler, DO Referred by: Sharion Settler, DO  Subjective: Chief Complaint  Patient presents with  . Lower Back - Pain  . Right Hip - Pain   HPI:  Monica Watson is a 65 y.o. female who comes in today at the request of Dr. Jean Rosenthal for planned Right L5-S1 lumbar facet/medial branch block with fluoroscopic guidance.  The patient has failed conservative care including home exercise, medications, time and activity modification.  This injection will be diagnostic and hopefully therapeutic.  Please see requesting physician notes for further details and justification.  Exam has shown concordant pain with facet joint loading. MRI reviewed with images and spine model.  MRI reviewed in the note below. Oxycodone 10mg  qid, Perrin Smack, Pa-C at [facility] in [place].  Despite the oxycodone daily she continues to have significant right-sided low back pain.  Reviewing MRI and x-rays it appears the L5-S1 facet joint looks really more arthritic than the other joints and think that is the source of pain.    ROS Otherwise per HPI.  Assessment & Plan: Visit Diagnoses:    ICD-10-CM   1. Spondylosis without myelopathy or radiculopathy, lumbar region  M47.816 XR C-ARM NO REPORT    Facet Injection    betamethasone acetate-betamethasone sodium phosphate (CELESTONE) injection 6 mg    Plan: No additional findings.   Meds & Orders:  Meds ordered this encounter  Medications  . betamethasone acetate-betamethasone sodium phosphate (CELESTONE) injection 6 mg    Orders Placed This Encounter  Procedures  . Facet Injection  . XR C-ARM NO REPORT    Follow-up: Return for visit to requesting physician as needed.   Procedures: No procedures performed  Lumbar Facet Joint Intra-Articular Injection(s) with Fluoroscopic Guidance  Patient:  Monica Watson      Date of Birth: 10-29-1955 MRN: [id number] PCP: Sharion Settler, DO      Visit Date: 10/17/2020   Universal Protocol:    Date/Time: 10/17/2020  Consent Given By: the patient  Position: PRONE   Additional Comments: Vital signs were monitored before and after the procedure. Patient was prepped and draped in the usual sterile fashion. The correct patient, procedure, and site was verified.   Injection Procedure Details:  Procedure Site One Meds Administered:  Meds ordered this encounter  Medications  . betamethasone acetate-betamethasone sodium phosphate (CELESTONE) injection 6 mg     Laterality: Right  Location/Site:  L5-S1  Needle size: 22 guage  Needle type: Spinal  Needle Placement: Articular  Findings:  -Comments: Excellent flow of contrast producing a partial arthrogram.  Procedure Details: The fluoroscope beam is vertically oriented in AP, and the inferior recess is visualized beneath the lower pole of the inferior apophyseal process, which represents the target point for needle insertion. When direct visualization is difficult the target point is located at the medial projection of the vertebral pedicle. The region overlying each aforementioned target is locally anesthetized with a 1 to 2 ml. volume of 1% Lidocaine without Epinephrine.   The spinal needle was inserted into each of the above mentioned facet joints using biplanar fluoroscopic guidance. A 0.25 to 0.5 ml. volume of Isovue-250 was injected and a partial facet joint arthrogram was obtained. A single spot film was obtained of the resulting arthrogram.    One to 1.25 ml of the steroid/anesthetic solution was then injected into each of the facet joints  noted above.   Additional Comments:  The patient tolerated the procedure well Dressing: 2 x 2 sterile gauze and Band-Aid    Post-procedure details: Patient was observed during the procedure. Post-procedure instructions were  reviewed.  Patient left the clinic in stable condition.      Clinical History: MRI LUMBAR SPINE WITHOUT CONTRAST  TECHNIQUE: Multiplanar, multisequence MR imaging of the lumbar spine was performed. No intravenous contrast was administered.  COMPARISON:  MRI lumbar spine 08/31/2019  FINDINGS: Segmentation:  Normal  Alignment:  Normal  Vertebrae:  Normal bone marrow.  Negative for fracture or mass.  Conus medullaris and cauda equina: Conus extends to the L1-2 level. Conus and cauda equina appear normal.  Paraspinal and other soft tissues: Negative for paraspinous mass or adenopathy.  Disc levels:  L1-2: Mild disc degeneration and disc bulging. Negative for stenosis  L2-3: Mild disc bulging and mild facet degeneration. No significant stenosis.  L3-4: Mild disc bulging and endplate spurring. Moderate facet degeneration. No significant stenosis.  L4-5: Disc degeneration with diffuse disc bulging and endplate spurring. Moderate facet hypertrophy and degenerative change bilaterally. Moderate subarticular and foraminal stenosis bilaterally with mild progression.  L5-S1: Mild disc degeneration with disc bulging and spurring. Bilateral facet hypertrophy. No significant stenosis.  IMPRESSION: Multilevel disc and facet degeneration lumbar spine most prominent L4-5. Mild progression of subarticular and foraminal stenosis bilaterally L4-5.   Electronically Signed   By: Franchot Gallo M.D.   On: 09/26/2020 11:56     Objective:  VS:  HT:    WT:   BMI:     BP:109/74  HR:80bpm  TEMP: ( )  RESP:  Physical Exam Vitals and nursing note reviewed.  Constitutional:      General: She is not in acute distress.    Appearance: Normal appearance. She is not ill-appearing.  HENT:     Head: Normocephalic and atraumatic.     Right Ear: External ear normal.     Left Ear: External ear normal.  Eyes:     Extraocular Movements: Extraocular movements intact.   Cardiovascular:     Rate and Rhythm: Normal rate.     Pulses: Normal pulses.  Pulmonary:     Effort: Pulmonary effort is normal. No respiratory distress.  Abdominal:     General: There is no distension.     Palpations: Abdomen is soft.  Musculoskeletal:        General: Tenderness present.     Cervical back: Neck supple.     Right lower leg: No edema.     Left lower leg: No edema.     Comments: Patient has good distal strength with no pain over the greater trochanters.  No clonus or focal weakness. Patient somewhat slow to rise from a seated position to full extension.  There is concordant low back pain with facet loading and lumbar spine extension rotation.  There are no definitive trigger points but the patient is somewhat tender across the lower back and PSIS.  There is no pain with hip rotation.   Skin:    Findings: No erythema, lesion or rash.  Neurological:     General: No focal deficit present.     Mental Status: She is alert and oriented to person, place, and time.     Sensory: No sensory deficit.     Motor: No weakness or abnormal muscle tone.     Coordination: Coordination normal.  Psychiatric:        Mood and Affect: Mood normal.  Behavior: Behavior normal.      Imaging: XR C-ARM NO REPORT  Result Date: 10/17/2020 Please see Notes tab for imaging impression.

## 2020-10-17 NOTE — Telephone Encounter (Signed)
Patient is calling stating the first form was denied and they are sent in another one to be completed but the old one was refaxed. They are needing the new one filled out again and resent. Please advise. Thanks!

## 2020-10-17 NOTE — Patient Instructions (Signed)

## 2020-10-18 ENCOUNTER — Ambulatory Visit: Payer: Medicaid Other

## 2020-10-18 NOTE — Procedures (Signed)
Lumbar Facet Joint Intra-Articular Injection(s) with Fluoroscopic Guidance  Patient: Monica Watson      Date of Birth: 09/22/1955 MRN: [id number] PCP: Sharion Settler, DO      Visit Date: 10/17/2020   Universal Protocol:    Date/Time: 10/17/2020  Consent Given By: the patient  Position: PRONE   Additional Comments: Vital signs were monitored before and after the procedure. Patient was prepped and draped in the usual sterile fashion. The correct patient, procedure, and site was verified.   Injection Procedure Details:  Procedure Site One Meds Administered:  Meds ordered this encounter  Medications  . betamethasone acetate-betamethasone sodium phosphate (CELESTONE) injection 6 mg     Laterality: Right  Location/Site:  L5-S1  Needle size: 22 guage  Needle type: Spinal  Needle Placement: Articular  Findings:  -Comments: Excellent flow of contrast producing a partial arthrogram.  Procedure Details: The fluoroscope beam is vertically oriented in AP, and the inferior recess is visualized beneath the lower pole of the inferior apophyseal process, which represents the target point for needle insertion. When direct visualization is difficult the target point is located at the medial projection of the vertebral pedicle. The region overlying each aforementioned target is locally anesthetized with a 1 to 2 ml. volume of 1% Lidocaine without Epinephrine.   The spinal needle was inserted into each of the above mentioned facet joints using biplanar fluoroscopic guidance. A 0.25 to 0.5 ml. volume of Isovue-250 was injected and a partial facet joint arthrogram was obtained. A single spot film was obtained of the resulting arthrogram.    One to 1.25 ml of the steroid/anesthetic solution was then injected into each of the facet joints noted above.   Additional Comments:  The patient tolerated the procedure well Dressing: 2 x 2 sterile gauze and Band-Aid    Post-procedure  details: Patient was observed during the procedure. Post-procedure instructions were reviewed.  Patient left the clinic in stable condition.

## 2020-10-19 ENCOUNTER — Ambulatory Visit: Payer: Medicaid Other

## 2020-10-21 ENCOUNTER — Other Ambulatory Visit: Payer: Self-pay

## 2020-10-21 ENCOUNTER — Ambulatory Visit (INDEPENDENT_AMBULATORY_CARE_PROVIDER_SITE_OTHER): Payer: Medicaid Other | Admitting: Family Medicine

## 2020-10-21 DIAGNOSIS — R1084 Generalized abdominal pain: Secondary | ICD-10-CM

## 2020-10-21 MED ORDER — POLYETHYLENE GLYCOL 3350 17 G PO PACK: 17.0000 g | PACK | Freq: Every day | ORAL | 0 refills | Status: AC

## 2020-10-21 NOTE — Patient Instructions (Signed)
It was a pleasure seeing you today.  I believe your symptoms are most likely related to constipation so I have sent a prescription for MiraLAX to your pharmacy.  If your symptoms do not improve please come back because I think we need to investigate this further.  If they worsen please let us know.  I would like for you to follow-up in a few weeks with your primary care provider to ensure that the symptoms have resolved.  If you have any questions or concerns please free to call the clinic.  I have a wonderful afternoon!

## 2020-10-21 NOTE — Progress Notes (Signed)
    SUBJECTIVE:   CHIEF COMPLAINT / HPI:  Abdominal pain Patient reports abdominal pain for the last 5 days.  She has history of issues with abdominal pain but is concerned at this time because it is lasted so long.  She reports that she did have an issue with constipation 2 days ago but has had a bowel movement since which was very hard initially and softened up and her abdominal pain is slightly improved since then.  Reports that the pain is generalized and when asked to point she rubs her whole stomach.  Denies any chest pain, shortness of breath.  Reports that she did have some nausea but it is resolved.  Says that for nausea in the past she got Phenergan which really helped.  Is not in need of any antinausea medications at this time.  Patient does report that she takes 4 Percocet daily and does not take anything for constipation.   OBJECTIVE:   BP 111/77   Pulse 85   Wt 199 lb 2 oz (90.3 kg)   SpO2 99%   BMI 33.14 kg/m   General: Well-appearing 65 year old female in no acute distress Cardiac: Regular rate and rhythm, no murmurs appreciated Respiratory: Normal work of breathing, lungs clear to auscultation bilaterally Abdomen: Soft, diffuse tenderness to palpation, not severe, positive bowel sounds Extremities: No gross abnormalities  ASSESSMENT/PLAN:   Abdominal pain Patient with 5-day history of abdominal pain which seems to be improving at this time.  Issues with constipation and diarrhea in the past.  Symptoms consistent with constipation issues.  Patient has not tried any medications to help with her constipation and she takes oxycodone 4 times a day.  Prescribed MiraLAX daily and patient can titrate as needed.  Strict ED and return precautions given and patient is agreeable to this.     Gifford Shave, MD [place]

## 2020-10-23 DIAGNOSIS — R109 Unspecified abdominal pain: Secondary | ICD-10-CM | POA: Insufficient documentation

## 2020-10-23 NOTE — Assessment & Plan Note (Signed)
Patient with 5-day history of abdominal pain which seems to be improving at this time.  Issues with constipation and diarrhea in the past.  Symptoms consistent with constipation issues.  Patient has not tried any medications to help with her constipation and she takes oxycodone 4 times a day.  Prescribed MiraLAX daily and patient can titrate as needed.  Strict ED and return precautions given and patient is agreeable to this.

## 2020-10-27 NOTE — Telephone Encounter (Signed)
Patient returns call to nurse line stating that Janeece Riggers needs forms with updated dates. Patient reports that by the time [facility] received the initial forms it was greater than 30 days from the date on the paper. I have printed off new forms and completed them per previous form from media. Needs provider signature and initials.   I have placed this in PCP box. Please return to "RN Team" once this has been completed.    Talbot Grumbling, RN

## 2020-11-02 NOTE — Telephone Encounter (Signed)
Form faxed to [employer].   Talbot Grumbling, RN

## 2020-11-09 ENCOUNTER — Ambulatory Visit: Payer: Medicaid Other | Admitting: Orthopaedic Surgery

## 2020-11-10 ENCOUNTER — Ambulatory Visit: Payer: Medicaid Other

## 2020-11-10 NOTE — Progress Notes (Deleted)
    SUBJECTIVE:   CHIEF COMPLAINT / HPI:   Swelling in neck   PERTINENT  PMH / PSH: ***  OBJECTIVE:   There were no vitals taken for this visit.  ***  ASSESSMENT/PLAN:   No problem-specific Assessment & Plan notes found for this encounter.     Wilber Oliphant, MD [place]   {    This will disappear when note is signed, click to select method of visit    :1}

## 2020-11-11 ENCOUNTER — Ambulatory Visit: Payer: Medicaid Other | Admitting: Family Medicine

## 2020-11-11 NOTE — Progress Notes (Deleted)
    SUBJECTIVE:   CHIEF COMPLAINT / HPI:   Swelling in neck   PERTINENT  PMH / PSH: ***  OBJECTIVE:   There were no vitals taken for this visit.  ***  ASSESSMENT/PLAN:   No problem-specific Assessment & Plan notes found for this encounter.     Jamice Carreno E Latarsha Zani, MD [facility]   {    This will disappear when note is signed, click to select method of visit    :1} 

## 2020-11-18 ENCOUNTER — Ambulatory Visit (HOSPITAL_COMMUNITY)
Admission: EM | Admit: 2020-11-18 | Discharge: 2020-11-18 | Disposition: A | Discharge status: Home / Self Care | Payer: Medicaid Other | Attending: Emergency Medicine | Admitting: Emergency Medicine

## 2020-11-18 ENCOUNTER — Other Ambulatory Visit: Payer: Self-pay

## 2020-11-18 ENCOUNTER — Encounter (HOSPITAL_COMMUNITY): Payer: Self-pay | Admitting: Emergency Medicine

## 2020-11-18 DIAGNOSIS — K1120 Sialoadenitis, unspecified: Secondary | ICD-10-CM

## 2020-11-18 MED ORDER — AMOXICILLIN-POT CLAVULANATE 875-125 MG PO TABS: 1.0000 | ORAL_TABLET | Freq: Two times a day (BID) | ORAL | 0 refills | Status: DC

## 2020-11-18 NOTE — ED Provider Notes (Signed)
[facility]  ____________________________________________  Time seen: Approximately 1:43 PM  I have reviewed the triage vital signs and the nursing notes.   HISTORY  Chief Complaint Neck Pain and Ear Pain    HPI Monica Watson is a 65 y.o. female who presents emergency department complaining of swelling and pain under the angle of the mandible left side.  Patient states that she thought she was bitten by a bug initially given the location and size but it is continued to increase in size and now she is having pain into the left ear.  No fevers or chills.  No dental pain.  Patient denies any difficulty breathing or swallowing.  She denies any extension into the anterior neck.  No medication for his complaint prior to arrival.  Patient denies any URI symptoms, sore throat, cough, shortness of breath.         Past Medical History:  Diagnosis Date  . Cerebrovascular disease   . Hypertension   . Type 2 diabetes mellitus [facility])     Patient Active Problem List   Diagnosis Date Noted  . Abdominal pain 10/23/2020  . Cervical cancer screening 08/10/2020  . Bartholin cyst 07/29/2020  . History of total knee replacement, left 06/13/2020  . History of total knee arthroplasty, right 06/13/2020  . Acute right-sided back pain 06/09/2020  . Depression, major, single episode, mild ([place]) 05/12/2020  . Vision changes 12/31/2019  . Bilateral knee pain 10/08/2019  . Poor dentition 10/08/2019  . Chronic left shoulder pain 08/31/2019  . Hemorrhoids 04/04/2019  . History of stroke 01/01/2019  . Hypertension associated with diabetes ([place]) 01/01/2019  . Pre-diabetes 01/01/2019  . Hot flashes 01/01/2019  . Onychomycosis 01/01/2019    Past Surgical History:  Procedure Laterality Date  . THYROIDECTOMY      Prior to Admission medications   Medication Sig Start Date End Date Taking? Authorizing Provider  amoxicillin-clavulanate (AUGMENTIN) 875-125 MG tablet Take 1 tablet by mouth 2  (two) times daily. 11/18/20  Yes Jeweldean Drohan, Charline Bills, PA-C  atorvastatin (LIPITOR) 20 MG tablet TAKE 1 TABLET(20 MG) BY MOUTH DAILY 01/01/20   Sharion Settler, DO  clonazePAM (KLONOPIN) 0.5 MG tablet Take 0.5 tablets (0.25 mg total) by mouth 2 (two) times daily. 03/15/20   Pieter Partridge, DO  ferrous sulfate (FEROSUL) 325 (65 FE) MG tablet Take 1 tablet (325 mg total) by mouth daily. 06/09/20   Sharion Settler, DO  gabapentin (NEURONTIN) 800 MG tablet Take 800 mg by mouth 3 (three) times daily. 11/25/18   [provider]  losartan (COZAAR) 50 MG tablet TAKE 1 TABLET BY MOUTH EVERY DAY FOR BLOOD PRESSURE 09/29/20   Sharion Settler, DO  metFORMIN (GLUCOPHAGE-XR) 500 MG 24 hr tablet TAKE 2 TABLETS BY MOUTH QD WITH EVENING MEAL FOR DIABETES 01/07/20   Martyn Malay, MD  ondansetron (ZOFRAN ODT) 4 MG disintegrating tablet Take 1 tablet (4 mg total) by mouth every 8 (eight) hours as needed for nausea or vomiting. 04/10/20   Wieters, Hallie C, PA-C  oxyCODONE-acetaminophen (PERCOCET) 10-325 MG tablet TAKE 1 TABLET BY MOUTH 4 TIMES DAILY AS NEEDED 12/09/18   [provider]  polyethylene glycol (MIRALAX) 17 g packet Take 17 g by mouth daily. 10/21/20   Gifford Shave, MD  PREMARIN vaginal cream INSERT 1 APPLICATORFUL VAGINALLY DAILY 02/15/20   Sharion Settler, DO  promethazine (PHENERGAN) 12.5 MG tablet Take 1 tablet (12.5 mg total) by mouth every 8 (eight) hours as needed for nausea or vomiting. 10/15/19  Enid Derry, [place], DO  tiZANidine (ZANAFLEX) 2 MG tablet Take 1 tablet (2 mg total) by mouth 2 (two) times daily as needed for muscle spasms. 06/09/20   Sharion Settler, DO    Allergies Patient has no known allergies.  History reviewed. No pertinent family history.  Social History Social History   Tobacco Use  . Smoking status: Current Every Day Smoker  . Smokeless tobacco: Never Used  Substance Use Topics  . Alcohol use: Never  . Drug use: Never     Review of  Systems  Constitutional: No fever/chills Eyes: No visual changes. No discharge ENT: No upper respiratory complaints.  Lesion under the angle of the mandible left side Cardiovascular: no chest pain. Respiratory: no cough. No SOB. Gastrointestinal: No abdominal pain.  No nausea, no vomiting.  No diarrhea.  No constipation. Musculoskeletal: Negative for musculoskeletal pain. Skin: Negative for rash, abrasions, lacerations, ecchymosis. Neurological: Negative for headaches, focal weakness or numbness.  10 System ROS otherwise negative.  ____________________________________________   PHYSICAL EXAM:  VITAL SIGNS: ED Triage Vitals  Enc Vitals Group     BP 11/18/20 1339 124/76     Pulse Rate 11/18/20 1339 75     Resp 11/18/20 1339 18     Temp 11/18/20 1339 98.9 F (37.2 C)     Temp Source 11/18/20 1339 Oral     SpO2 11/18/20 1339 100 %     Weight --      Height --      Head Circumference --      Peak Flow --      Pain Score 11/18/20 1329 8     Pain Loc --      Pain Edu? --      Excl. in [place]? --      Constitutional: Alert and oriented. Well appearing and in no acute distress. Eyes: Conjunctivae are normal. PERRL. EOMI. Head: Atraumatic. ENT:      Ears:       Nose: No congestion/rhinnorhea.      Mouth/Throat: Mucous membranes are moist.  No intraoral erythema or edema.  Visualization of the submandibular space at the angle of the mandible left side reveals firm lesion with tenderness.  No extension across the midline.  This does not extend into the cheek. Neck: No stridor.  No cervical spine tenderness to palpation.  Anterior neck with no erythema, edema or tenderness. Hematological/Lymphatic/Immunilogical: No cervical lymphadenopathy. Cardiovascular: Normal rate, regular rhythm. Normal S1 and S2.  Good peripheral circulation. Respiratory: Normal respiratory effort without tachypnea or retractions. Lungs CTAB. Good air entry to the bases with no decreased or absent breath  sounds. Musculoskeletal: Full range of motion to all extremities. No gross deformities appreciated. Neurologic:  Normal speech and language. No gross focal neurologic deficits are appreciated.  Skin:  Skin is warm, dry and intact. No rash noted. Psychiatric: Mood and affect are normal. Speech and behavior are normal. Patient exhibits appropriate insight and judgement.   ____________________________________________   LABS (all labs ordered are listed, but only abnormal results are displayed)  Labs Reviewed - No data to display ____________________________________________  EKG   ____________________________________________  RADIOLOGY   No results found.  ____________________________________________    PROCEDURES  Procedure(s) performed:    Procedures    Medications - No data to display   ____________________________________________   INITIAL IMPRESSION / ASSESSMENT AND PLAN / ED COURSE  Pertinent labs & imaging results that were available during my care of the patient were reviewed by me and considered in  my medical decision making (see chart for details).  Review of the  [place]RS was performed in accordance of the [place] prior to dispensing any controlled drugs.           Patient's diagnosis is consistent with sialadenitis.  Patient presented to the emergency department complaining of a painful lesion in the left submandibular region.  Findings are consistent with sialadenitis.  Patient will be placed on antibiotics and instructed to use sour candy.  There was no other concerning findings on physical exam.  No indication for labs or imaging currently.  Patient is instructed to follow-up with the emergency department in 2 days if symptoms do not improve with conservative measures..  She may use Tylenol and Motrin in addition to antibiotic for symptom relief.  Follow-up primary care as needed.    ____________________________________________  FINAL CLINICAL  IMPRESSION(S) / DIAGNOSES  Final diagnoses:  Sialadenitis      NEW MEDICATIONS STARTED DURING THIS VISIT:  ED Discharge Orders         Ordered    amoxicillin-clavulanate (AUGMENTIN) 875-125 MG tablet  2 times daily        11/18/20 1352              This chart was dictated using voice recognition software/Dragon. Despite best efforts to proofread, errors can occur which can change the meaning. Any change was purely unintentional.    Darletta Moll, PA-C 11/18/20 1356

## 2020-11-18 NOTE — ED Triage Notes (Signed)
Pt presents with ear pain x 4 days. Pt states the pain started at the neck and moved to the ear. She states she was bitten by a bug and states the pain started then.

## 2020-11-23 ENCOUNTER — Encounter: Payer: Self-pay | Admitting: Family Medicine

## 2020-11-23 ENCOUNTER — Other Ambulatory Visit: Payer: Self-pay

## 2020-11-23 ENCOUNTER — Ambulatory Visit: Payer: Medicaid Other | Admitting: Family Medicine

## 2020-11-23 VITALS — BP 121/80 | HR 69 | Ht 65.0 in | Wt 195.0 lb

## 2020-11-23 DIAGNOSIS — R61 Generalized hyperhidrosis: Secondary | ICD-10-CM | POA: Diagnosis not present

## 2020-11-23 DIAGNOSIS — K1120 Sialoadenitis, unspecified: Secondary | ICD-10-CM | POA: Diagnosis not present

## 2020-11-23 DIAGNOSIS — Z23 Encounter for immunization: Secondary | ICD-10-CM | POA: Diagnosis not present

## 2020-11-23 MED ORDER — GLYCOPYRRONIUM TOSYLATE 2.4 % EX PADS: 1.0000 | MEDICATED_PAD | Freq: Every day | CUTANEOUS | 0 refills | Status: DC | PRN

## 2020-11-23 NOTE — Patient Instructions (Signed)
It was wonderful to see you today.  Please bring ALL of your medications with you to every visit.   Today we talked about:  The swelling on your face. I think this is due to a salivary stone. Continue to massage the area, you can use warm compresses to the area and suck on sour candy. Complete the antibiotics that you were prescribed at Urgent Care.  I believe what you are feeling in your ear is related to this since they are so close together.   If the area becomes warm, more painful, gets larger or if you develop fevers please return. We may need to do further imaging.    Thank you for choosing [facility].   Please call [phone] with any questions about today's appointment.  Please be sure to schedule follow up at the front  desk before you leave today.   Sharion Settler, DO PGY-1 Family Medicine    Salivary Stone  A salivary stone is a mineral deposit that builds up in the ducts that drain your salivary glands. Most salivary stones are made of calcium. When a stone forms, saliva can back up into the gland and cause painful swelling. Your salivary glands are the glands that produce saliva. You have six major salivary glands. Each gland has a duct that carries saliva into your mouth. Saliva keeps your mouth moist and breaks down the food that you eat. It also helps prevent tooth decay. Two salivary glands are located just in front of your ears (parotid). The ducts for these glands open up inside your cheeks, near your back teeth. You also have two glands under your tongue (sublingual) and two glands under your jaw (submandibular). The ducts for these glands open under your tongue. A stone can form in any salivary gland. The most common place for a salivary stone to develop is in a submandibular salivary gland. What are the causes? Salivary stones may be caused by any condition that reduces the flow of saliva. It is not known why some people form stones. What  increases the risk? You are more likely to develop this condition if:  You are female.  You do not drink enough water.  You smoke.  You have any of the following: ? High blood pressure. ? Gout. ? Diabetes. What are the signs or symptoms? The main sign of a salivary gland stone is sudden swelling of a salivary gland when eating. This usually happens under the jaw on one side. Other signs and symptoms may include:  Swelling of the cheek or under the tongue when eating.  Pain in the swollen area.  Trouble chewing or swallowing.  Swelling that goes down after eating. How is this diagnosed? This condition may be diagnosed based on:  Your signs and symptoms.  A physical exam. In many cases, your health care provider will be able to feel the stone in a duct inside your mouth.  Imaging studies, such as: ? X-rays. ? Ultrasound. ? CT scan. ? MRI. You may need to see an ear, nose, and throat specialist (ENT or otolaryngologist) for diagnosis and treatment. How is this treated? Treatment for this condition depends on the size of the stone.  A small stone that is not causing symptoms may be treated with home care.  For a stone that is large enough to cause symptoms, the treatment options may include: ? Probing and widening of the duct to allow the stone to pass. ? Inserting a thin, flexible scope (  endoscope) into the duct to locate and remove the stone. ? Breaking up the stone with sound waves. ? Removing the entire salivary gland. Follow these instructions at home: To relieve discomfort  Follow these instructions every few hours: ? Suck on a lemon candy to stimulate the flow of saliva. ? Put a warm compress over the gland. ? Gently massage the gland. General instructions  Drink enough fluid to keep your urine pale yellow.  Do not use any products that contain nicotine or tobacco, such as cigarettes and e-cigarettes. If you need help quitting, ask your health care  provider.  Keep all follow-up visits as told by your health care provider. This is important.   Contact a health care provider if:  You have pain and swelling in your face, jaw, or mouth after eating.  You have persistent swelling in any of these places: ? In front of your ear. ? Under your jaw. ? Inside your mouth. Get help right away if:  You have pain and swelling in your face, jaw, or mouth, and this is getting worse.  Your pain and swelling make it hard to swallow or breathe. Summary  A salivary stone is a mineral deposit that builds up in the ducts that drain your salivary glands.  When a stone forms, saliva can back up into the gland and cause painful swelling.  Salivary stones may be caused by any condition that reduces the flow of saliva.  Treatment for this condition depends on the size of the stone. This information is not intended to replace advice given to you by your health care provider. Make sure you discuss any questions you have with your health care provider. Document Revised: 07/22/2017 Document Reviewed: 07/22/2017 Elsevier Patient Education  2021 [employer].

## 2020-11-23 NOTE — Assessment & Plan Note (Signed)
Of the left parotid gland x 10 days.  Appears to be improving with increased salivation from sour candies. Seen at [facility] and prescribed Augmentin on 5/27.  Doubt bacterial infection at this time though recommended that patient continue the antibiotic course that she is already started.  Reassuring that she does not have any pain, there is no erythema, and seems to be improving.  Recommended massaging the area, warm compresses and advised that she continue candies and increase salivation such as sour candies.  Believe that her ear discomfort is caused by referred pain from sialadenitis.  -Massage the area -Continue to suck on sour candies to increase salivation -Warm compresses to the area -Return precautions given

## 2020-11-23 NOTE — Progress Notes (Signed)
    SUBJECTIVE:   CHIEF COMPLAINT / HPI:   Left-side Neck Swelling Patient presents for follow-up on her left-sided neck swelling.  States that she was seen in urgent care recently and prescribed antibiotics for this.  She was also advised to suck on sour candies.  She has been doing this and feels that the swelling has gone down some.  She denies any pain or redness to the area.  She denies any fevers.  Has never had similar symptoms in the past.  Feels that her left ear feels swollen.  PERTINENT  PMH / PSH:  Past Medical History:  Diagnosis Date  . Cerebrovascular disease   . Hypertension   . Type 2 diabetes mellitus (HCC)      OBJECTIVE:   BP 121/80   Pulse 69   Ht 5\' 5"  (1.651 m)   Wt 195 lb (88.5 kg)   SpO2 96%   BMI 32.45 kg/m    General: NAD, pleasant, able to participate in exam HEENT: Normocephalic, TM's yellow and opaque in neutral position, posterior to left ear there is an approximately 3x4 cm soft, mobile, non-tender, non-erythematous mass, no glandular abnormalities/stones appreciated in mouth Cardiac: RRR, no murmurs. Respiratory: CTAB, normal effort, No wheezes, rales or rhonchi Psych: Normal affect and mood      ASSESSMENT/PLAN:   Sialadenitis Of the left parotid gland x 10 days.  Appears to be improving with increased salivation from sour candies. Seen at [facility] and prescribed Augmentin on 5/27.  Doubt bacterial infection at this time though recommended that patient continue the antibiotic course that she is already started.  Reassuring that she does not have any pain, there is no erythema, and seems to be improving.  Recommended massaging the area, warm compresses and advised that she continue candies and increase salivation such as sour candies.  Believe that her ear discomfort is caused by referred pain from sialadenitis.  -Massage the area -Continue to suck on sour candies to increase salivation -Warm compresses to the area -Return precautions given       Sharion Settler, [place]

## 2020-11-24 ENCOUNTER — Other Ambulatory Visit: Payer: Self-pay

## 2020-11-25 NOTE — Progress Notes (Signed)
Need orders in epic.  PReop on 6/16.  Surgery on 6/24

## 2020-12-05 ENCOUNTER — Other Ambulatory Visit: Payer: Self-pay | Admitting: Physician Assistant

## 2020-12-05 ENCOUNTER — Telehealth: Payer: Self-pay

## 2020-12-05 NOTE — Telephone Encounter (Signed)
Patient calls nurse line regarding testing positive for COVID. Patient reports symptom onset last Monday, 11/28/2020. Patient reports cough with no improvement with OTC Mucinex.   Patient is requesting prescription medication for cough.   Please advise.   Talbot Grumbling, RN

## 2020-12-06 ENCOUNTER — Other Ambulatory Visit (HOSPITAL_COMMUNITY): Payer: Medicaid Other

## 2020-12-06 ENCOUNTER — Other Ambulatory Visit: Payer: Self-pay | Admitting: Family Medicine

## 2020-12-06 DIAGNOSIS — R61 Generalized hyperhidrosis: Secondary | ICD-10-CM

## 2020-12-06 MED ORDER — GLYCOPYRRONIUM TOSYLATE 2.4 % EX PADS: 1.0000 | MEDICATED_PAD | Freq: Every day | CUTANEOUS | 0 refills | Status: DC | PRN

## 2020-12-06 MED ORDER — BENZONATATE 100 MG PO CAPS: 100.0000 mg | ORAL_CAPSULE | Freq: Three times a day (TID) | ORAL | 0 refills | Status: DC | PRN

## 2020-12-06 NOTE — Progress Notes (Signed)
Rx Tessalon Perles for cough from COVID. Also resent glycopyrronium tosylate as patient reports pharmacy did not receive.

## 2020-12-06 NOTE — Progress Notes (Addendum)
D              Monica Watson  12/06/2020   Your procedure is scheduled on:     12/16/20   Report to [facility] Main  Entrance   Report to admitting at   1000am      Call this number if you have problems the morning of surgery [phone]    REMEMBER: NO  SOLID FOOD CANDY OR GUM AFTER MIDNIGHT. CLEAR LIQUIDS UNTIL       0930am      . NOTHING BY MOUTH EXCEPT CLEAR LIQUIDS UNTIL 0930 am   . PLEASE FINISH  G2 Lower Sugar Gatorade DRINK   0915amPER SURGEON ORDER  WHICH NEEDS TO BE COMPLETED AT    0930 am     Eat a good healthy snack prior to bedtime.   CLEAR LIQUID DIET   Foods Allowed                                                                    Coffee and tea, regular and decaf                            Fruit ices (not with fruit pulp)                                      Iced Popsicles                                    Carbonated beverages, regular and diet                                    Cranberry, grape and apple juices Sports drinks like Gatorade Lightly seasoned clear broth or consume(fat free) Sugar, honey syrup ___________________________________________________________________      BRUSH YOUR TEETH MORNING OF SURGERY AND RINSE YOUR MOUTH OUT, NO CHEWING GUM CANDY OR MINTS.     Take these medicines the morning of surgery with A SIP gabapentin OF WATER:  None  DO NOT TAKE ANY DIABETIC MEDICATIONS DAY OF YOUR SURGERY                               You may not have any metal on your body including hair pins and              piercings  Do not wear jewelry, make-up, lotions, powders or perfumes, deodorant             Do not wear nail polish on your fingernails.  Do not shave  48 hours prior to surgery.              Men may shave face and neck.   Do not bring valuables to the hospital. [place].  Contacts, dentures or bridgework may not be  worn into surgery.  Leave suitcase in the car. After surgery it  may be brought to your room.     Patients discharged the day of surgery will not be allowed to drive home. IF YOU ARE HAVING SURGERY AND GOING HOME THE SAME DAY, YOU MUST HAVE AN ADULT TO DRIVE YOU HOME AND BE WITH YOU FOR 24 HOURS. YOU MAY GO HOME BY TAXI OR UBER OR ORTHERWISE, BUT AN ADULT MUST ACCOMPANY YOU HOME AND STAY WITH YOU FOR 24 HOURS.  Name and phone number of your driver:  Special Instructions: N/A              Please read over the following fact sheets you were given: _____________________________________________________________________  [facility] - Preparing for Surgery Before surgery, you can play an important role.  Because skin is not sterile, your skin needs to be as free of germs as possible.  You can reduce the number of germs on your skin by washing with CHG (chlorahexidine gluconate) soap before surgery.  CHG is an antiseptic cleaner which kills germs and bonds with the skin to continue killing germs even after washing. Please DO NOT use if you have an allergy to CHG or antibacterial soaps.  If your skin becomes reddened/irritated stop using the CHG and inform your nurse when you arrive at Short Stay. Do not shave (including legs and underarms) for at least 48 hours prior to the first CHG shower.  You may shave your face/neck. Please follow these instructions carefully:  1.  Shower with CHG Soap the night before surgery and the  morning of Surgery.  2.  If you choose to wash your hair, wash your hair first as usual with your  normal  shampoo.  3.  After you shampoo, rinse your hair and body thoroughly to remove the  shampoo.                           4.  Use CHG as you would any other liquid soap.  You can apply chg directly  to the skin and wash                       Gently with a scrungie or clean washcloth.  5.  Apply the CHG Soap to your body ONLY FROM THE NECK DOWN.   Do not use on face/ open                           Wound or open sores. Avoid contact with eyes,  ears mouth and genitals (private parts).                       Wash face,  Genitals (private parts) with your normal soap.             6.  Wash thoroughly, paying special attention to the area where your surgery  will be performed.  7.  Thoroughly rinse your body with warm water from the neck down.  8.  DO NOT shower/wash with your normal soap after using and rinsing off  the CHG Soap.                9.  Pat yourself dry with a clean towel.            10.  Wear clean pajamas.  11.  Place clean sheets on your bed the night of your first shower and do not  sleep with pets. Day of Surgery : Do not apply any lotions/deodorants the morning of surgery.  Please wear clean clothes to the hospital/surgery center.  FAILURE TO FOLLOW THESE INSTRUCTIONS MAY RESULT IN THE CANCELLATION OF YOUR SURGERY PATIENT SIGNATURE_________________________________  NURSE SIGNATURE__________________________________  ________________________________________________________________________

## 2020-12-08 ENCOUNTER — Encounter (HOSPITAL_COMMUNITY): Payer: Self-pay

## 2020-12-08 ENCOUNTER — Other Ambulatory Visit: Payer: Self-pay

## 2020-12-08 ENCOUNTER — Encounter (HOSPITAL_COMMUNITY)
Admission: RE | Admit: 2020-12-08 | Discharge: 2020-12-08 | Disposition: A | Discharge status: Home / Self Care | Payer: Medicaid Other | Source: Ambulatory Visit | Attending: Orthopaedic Surgery | Admitting: Orthopaedic Surgery

## 2020-12-08 ENCOUNTER — Telehealth: Payer: Self-pay

## 2020-12-08 HISTORY — DX: Depression, unspecified: F32.A

## 2020-12-08 HISTORY — DX: Unspecified osteoarthritis, unspecified site: M19.90

## 2020-12-08 HISTORY — DX: Cerebral infarction, unspecified: I63.9

## 2020-12-08 NOTE — Progress Notes (Signed)
PT reported at admitting she tested positive about a week ago.  PST nurse spoke with her via phone and pt could not give an exact date of positive covid test which pt reports was a home test.   Informed pt that we could not see her on preop appt today.   Instructed pt to call DR Ninfa Linden office and let them be awarre.  Hal Neer, Admitting was aware and heard phone call to patient .  Hal Neer took her bottled water and pack of nabs.  PT stated she was calling her transportation to come back and pick her up.  Called office of DR Zollie Beckers and spoke with [facility] and made her aware of above.

## 2020-12-08 NOTE — Telephone Encounter (Signed)
Will you want her to have physical therapy? If so, her insurance will only pay for outpatient

## 2020-12-09 ENCOUNTER — Other Ambulatory Visit: Payer: Self-pay

## 2020-12-09 DIAGNOSIS — G8929 Other chronic pain: Secondary | ICD-10-CM

## 2020-12-09 NOTE — Telephone Encounter (Signed)
Sent referral 

## 2020-12-12 ENCOUNTER — Telehealth: Payer: Self-pay

## 2020-12-12 NOTE — Telephone Encounter (Signed)
Nurse from [facility] would like to know if Dr Ninfa Linden will be giving her post op pain meds? Her SU is scheduled for 12/16/2020  CB [phone]

## 2020-12-12 NOTE — Telephone Encounter (Signed)
Tried calling # back...says not in service. Do you happen to have another #?

## 2020-12-12 NOTE — Telephone Encounter (Signed)
Called back and advised  

## 2020-12-12 NOTE — Telephone Encounter (Signed)
This is the actual number she called from  earlier [phone] but stated her direct CB number was the one listed below.

## 2020-12-13 ENCOUNTER — Encounter (HOSPITAL_COMMUNITY)
Admission: RE | Admit: 2020-12-13 | Discharge: 2020-12-13 | Disposition: A | Discharge status: Home / Self Care | Payer: Medicaid Other | Source: Ambulatory Visit | Attending: Orthopaedic Surgery | Admitting: Orthopaedic Surgery

## 2020-12-13 ENCOUNTER — Other Ambulatory Visit: Payer: Self-pay

## 2020-12-13 DIAGNOSIS — I491 Atrial premature depolarization: Secondary | ICD-10-CM | POA: Insufficient documentation

## 2020-12-13 DIAGNOSIS — Z01818 Encounter for other preprocedural examination: Secondary | ICD-10-CM | POA: Insufficient documentation

## 2020-12-13 LAB — CBC
HCT: 43 % (ref 36.0–46.0)
Hemoglobin: 14.8 g/dL (ref 12.0–15.0)
MCH: 33.3 pg (ref 26.0–34.0)
MCHC: 34.4 g/dL (ref 30.0–36.0)
MCV: 96.6 fL (ref 80.0–100.0)
Platelets: 246 10*3/uL (ref 150–400)
RBC: 4.45 MIL/uL (ref 3.87–5.11)
RDW: 12 % (ref 11.5–15.5)
WBC: 3.2 10*3/uL — ABNORMAL LOW (ref 4.0–10.5)
nRBC: 0 % (ref 0.0–0.2)

## 2020-12-13 LAB — BASIC METABOLIC PANEL
Anion gap: 5 (ref 5–15)
BUN: 14 mg/dL (ref 8–23)
CO2: 26 mmol/L (ref 22–32)
Calcium: 9.2 mg/dL (ref 8.9–10.3)
Chloride: 109 mmol/L (ref 98–111)
Creatinine, Ser: 0.92 mg/dL (ref 0.44–1.00)
GFR, Estimated: >60 mL/min (ref >60)
Glucose, Bld: 97 mg/dL (ref 70–99)
Potassium: 4.1 mmol/L (ref 3.5–5.1)
Sodium: 140 mmol/L (ref 135–145)

## 2020-12-13 LAB — SURGICAL PCR SCREEN
MRSA, PCR: NEGATIVE
Staphylococcus aureus: NEGATIVE

## 2020-12-13 LAB — GLUCOSE, CAPILLARY: Glucose-Capillary: 116 mg/dL — ABNORMAL HIGH (ref 70–99)

## 2020-12-13 NOTE — Progress Notes (Signed)
Pt came in today for preop labs.  PT voiced no complaints.  VSS.

## 2020-12-14 LAB — HEMOGLOBIN A1C
Hgb A1c MFr Bld: 6.2 % — ABNORMAL HIGH (ref 4.8–5.6)
Mean Plasma Glucose: 131.24 mg/dL

## 2020-12-14 NOTE — Progress Notes (Addendum)
Anesthesia Review:  PCP: DR Adline Mango 11/23/20  Cardiologist : Chest x-ray : EKG : 12/13/20  Echo : Stress test: Cardiac Cath :  Activity level: can do a flight of stairs without difficulty  Sleep Study/ CPAP : Fasting Blood Sugar :      / Checks Blood Sugar -- times a day:   Blood Thinner/ Instructions /Last Dose: ASA / Instructions/ Last Dose :   Hgba1c-6.2 done 12/13/20  DM- type 2

## 2020-12-15 DIAGNOSIS — T84062D Wear of articular bearing surface of internal prosthetic right knee joint, subsequent encounter: Secondary | ICD-10-CM

## 2020-12-15 NOTE — H&P (Signed)
TOTAL KNEE REVISION ADMISSION H&P  Patient is being admitted for right revision total knee arthroplasty.  Subjective:  Chief Complaint:right knee pain.  HPI: Monica Watson, 65 y.o. female, has a history of pain and functional disability in the right knee(s) due to  poly-liner wear with synovitis  and patient has failed non-surgical conservative treatments for greater than 12 weeks to include NSAID's and/or analgesics, flexibility and strengthening excercises, use of assistive devices, and activity modification. The indications for the revision of the total knee arthroplasty are bearing surface wear leading to symptomatic synovitis and tibiofemoral instability. Onset of symptoms was gradual starting 2 years ago with gradually worsening course since that time.  Prior procedures on the right knee(s) include arthroplasty.  Patient currently rates pain in the right knee(s) at 8 out of 10 with activity. There is worsening of pain with activity and weight bearing, pain that interferes with activities of daily living, pain with passive range of motion, and joint swelling.  Patient has evidence of joint space narrowing by imaging studies. This condition presents safety issues increasing the risk of falls.  There is no current active infection.  Patient Active Problem List   Diagnosis Date Noted   Polyethylene wear of right knee prosthesis, 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/15/2020   Sialadenitis 11/23/2020   Abdominal pain 10/23/2020   Cervical cancer screening 08/10/2020   Bartholin cyst 07/29/2020   History of total knee replacement, left 06/13/2020   History of total knee arthroplasty, right 06/13/2020   Acute right-sided back pain 06/09/2020   Depression, major, single episode, mild ([place]) 05/12/2020   Vision changes 12/31/2019   Bilateral knee pain 10/08/2019   Poor dentition 10/08/2019   Chronic left shoulder pain 08/31/2019   Hemorrhoids 04/04/2019   History of stroke 01/01/2019   Hypertension  associated with diabetes ([place]) 01/01/2019   Pre-diabetes 01/01/2019   Hot flashes 01/01/2019   Onychomycosis 01/01/2019   Past Medical History:  Diagnosis Date   Arthritis    Cerebrovascular disease    Depression    Hypertension    Stroke ([place])    Type 2 diabetes mellitus ([place])     Past Surgical History:  Procedure Laterality Date   bilateral knee replacements      bilateral shoulder replacement     left carpal tunnel release      THYROIDECTOMY      No current facility-administered medications for this encounter.   Current Outpatient Medications  Medication Sig Dispense Refill Last Dose   atorvastatin (LIPITOR) 20 MG tablet TAKE 1 TABLET(20 MG) BY MOUTH DAILY (Patient taking differently: Take 20 mg by mouth daily. TAKE 1 TABLET(20 MG) BY MOUTH DAILY) 60 tablet 0    gabapentin (NEURONTIN) 800 MG tablet Take 800 mg by mouth at bedtime.      losartan (COZAAR) 50 MG tablet TAKE 1 TABLET BY MOUTH EVERY DAY FOR BLOOD PRESSURE (Patient taking differently: Take 50 mg by mouth daily. TAKE 1 TABLET BY MOUTH EVERY DAY FOR BLOOD PRESSURE) 90 tablet 0    oxyCODONE-acetaminophen (PERCOCET) 10-325 MG tablet Take 1 tablet by mouth every 6 (six) hours as needed for pain.      polyethylene glycol (MIRALAX) 17 g packet Take 17 g by mouth daily. (Patient taking differently: Take 17 g by mouth daily as needed for moderate constipation.) 90 each 0    tiZANidine (ZANAFLEX) 2 MG tablet Take 1 tablet (2 mg total) by mouth 2 (two) times daily as needed for muscle spasms. (Patient taking differently: Take 2 mg  by mouth every 8 (eight) hours as needed for muscle spasms.) 30 tablet 0    amoxicillin-clavulanate (AUGMENTIN) 875-125 MG tablet Take 1 tablet by mouth 2 (two) times daily. (Patient not taking: Reported on 12/02/2020) 14 tablet 0 Not Taking   benzonatate (TESSALON) 100 MG capsule Take 1 capsule (100 mg total) by mouth 3 (three) times daily as needed for cough. 30 capsule 0    clonazePAM (KLONOPIN) 0.5 MG  tablet Take 0.5 tablets (0.25 mg total) by mouth 2 (two) times daily. (Patient not taking: No sig reported) 30 tablet 3 Not Taking   ferrous sulfate (FEROSUL) 325 (65 FE) MG tablet Take 1 tablet (325 mg total) by mouth daily. (Patient not taking: No sig reported) 30 tablet 3 Not Taking   Glycopyrronium Tosylate 2.4 % PADS Apply 1 each topically daily as needed (sweating). 30 each 0    metFORMIN (GLUCOPHAGE-XR) 500 MG 24 hr tablet TAKE 2 TABLETS BY MOUTH QD WITH EVENING MEAL FOR DIABETES (Patient not taking: Reported on 12/02/2020) 90 tablet 0 Not Taking   ondansetron (ZOFRAN ODT) 4 MG disintegrating tablet Take 1 tablet (4 mg total) by mouth every 8 (eight) hours as needed for nausea or vomiting. (Patient not taking: No sig reported) 20 tablet 0 Not Taking   PREMARIN vaginal cream INSERT 1 APPLICATORFUL VAGINALLY DAILY (Patient not taking: No sig reported) 30 g 6 Not Taking   promethazine (PHENERGAN) 12.5 MG tablet Take 1 tablet (12.5 mg total) by mouth every 8 (eight) hours as needed for nausea or vomiting. (Patient not taking: No sig reported) 30 tablet 0 Not Taking   No Known Allergies  Social History   Tobacco Use   Smoking status: Every Day    Packs/day: 0.50    Years: 46.00    Pack years: 23.00    Types: Cigarettes   Smokeless tobacco: Never  Substance Use Topics   Alcohol use: Never    No family history on file.    Review of Systems  Musculoskeletal:  Positive for joint swelling.  All other systems reviewed and are negative.   Objective:  Physical Exam Vitals reviewed.  Constitutional:      Appearance: Normal appearance.  HENT:     Head: Normocephalic and atraumatic.  Eyes:     Extraocular Movements: Extraocular movements intact.     Pupils: Pupils are equal, round, and reactive to light.  Cardiovascular:     Rate and Rhythm: Normal rate.  Pulmonary:     Effort: Pulmonary effort is normal.     Breath sounds: Normal breath sounds.  Abdominal:     Palpations: Abdomen  is soft.  Musculoskeletal:     Cervical back: Normal range of motion and neck supple.     Right knee: Effusion present. LCL laxity and MCL laxity present.  Neurological:     Mental Status: She is alert and oriented to person, place, and time.  Psychiatric:        Behavior: Behavior normal.    Vital signs in last 24 hours:    Labs:  Estimated body mass index is 31.45 kg/m as calculated from the following:   Height as of 12/13/20: 5\' 5"  (1.651 m).   Weight as of 12/13/20: 85.7 kg.  Imaging Review Plain radiographs demonstrate well-seated components of the original total knee arthroplasty with no evidence of loosening.  There is narrowing of the joint space.   Assessment/Plan: Polyliner wear right total knee arthroplasty with symptomatic synovitis and laxity with varus and valgus stressing.  The plan will be to proceed to surgery today for removal of the previous polyethylene liner and upsizing to a thicker poly to allow for better stability of the knee.  The risks and benefits of surgery been explained in detail and informed consent is obtained.

## 2020-12-15 NOTE — Progress Notes (Signed)
Patient called regarding surgery time change.  Patient verbalized understanding to arrive at 7:45am at [facility] for surgery

## 2020-12-16 ENCOUNTER — Inpatient Hospital Stay (HOSPITAL_COMMUNITY)
Admission: RE | Admit: 2020-12-16 | Discharge: 2020-12-17 | DRG: 488 | Disposition: A | Discharge status: Home Health | Payer: Medicaid Other | Attending: Orthopaedic Surgery | Admitting: Orthopaedic Surgery

## 2020-12-16 ENCOUNTER — Encounter (HOSPITAL_COMMUNITY)
Admission: RE | Disposition: A | Discharge status: Home Health | Payer: Self-pay | Source: Home / Self Care | Attending: Orthopaedic Surgery

## 2020-12-16 ENCOUNTER — Ambulatory Visit (HOSPITAL_COMMUNITY): Payer: Medicaid Other | Admitting: Physician Assistant

## 2020-12-16 ENCOUNTER — Other Ambulatory Visit: Payer: Self-pay

## 2020-12-16 ENCOUNTER — Encounter (HOSPITAL_COMMUNITY): Payer: Self-pay | Admitting: Orthopaedic Surgery

## 2020-12-16 ENCOUNTER — Ambulatory Visit (HOSPITAL_COMMUNITY): Payer: Medicaid Other | Admitting: Anesthesiology

## 2020-12-16 DIAGNOSIS — Z20822 Contact with and (suspected) exposure to covid-19: Secondary | ICD-10-CM | POA: Diagnosis present

## 2020-12-16 DIAGNOSIS — Y792 Prosthetic and other implants, materials and accessory orthopedic devices associated with adverse incidents: Secondary | ICD-10-CM | POA: Diagnosis present

## 2020-12-16 DIAGNOSIS — I69354 Hemiplegia and hemiparesis following cerebral infarction affecting left non-dominant side: Secondary | ICD-10-CM

## 2020-12-16 DIAGNOSIS — T84062D Wear of articular bearing surface of internal prosthetic right knee joint, subsequent encounter: Secondary | ICD-10-CM | POA: Diagnosis not present

## 2020-12-16 DIAGNOSIS — Z6831 Body mass index (BMI) 31.0-31.9, adult: Secondary | ICD-10-CM

## 2020-12-16 DIAGNOSIS — E890 Postprocedural hypothyroidism: Secondary | ICD-10-CM | POA: Diagnosis present

## 2020-12-16 DIAGNOSIS — M25361 Other instability, right knee: Secondary | ICD-10-CM | POA: Diagnosis present

## 2020-12-16 DIAGNOSIS — F17210 Nicotine dependence, cigarettes, uncomplicated: Secondary | ICD-10-CM | POA: Diagnosis present

## 2020-12-16 DIAGNOSIS — T8484XA Pain due to internal orthopedic prosthetic devices, implants and grafts, initial encounter: Principal | ICD-10-CM | POA: Diagnosis present

## 2020-12-16 DIAGNOSIS — Z96611 Presence of right artificial shoulder joint: Secondary | ICD-10-CM | POA: Diagnosis present

## 2020-12-16 DIAGNOSIS — Z79891 Long term (current) use of opiate analgesic: Secondary | ICD-10-CM

## 2020-12-16 DIAGNOSIS — E1169 Type 2 diabetes mellitus with other specified complication: Secondary | ICD-10-CM | POA: Diagnosis present

## 2020-12-16 DIAGNOSIS — M791 Myalgia, unspecified site: Secondary | ICD-10-CM

## 2020-12-16 DIAGNOSIS — E669 Obesity, unspecified: Secondary | ICD-10-CM | POA: Diagnosis present

## 2020-12-16 DIAGNOSIS — Z79899 Other long term (current) drug therapy: Secondary | ICD-10-CM

## 2020-12-16 DIAGNOSIS — I69328 Other speech and language deficits following cerebral infarction: Secondary | ICD-10-CM

## 2020-12-16 DIAGNOSIS — M65861 Other synovitis and tenosynovitis, right lower leg: Secondary | ICD-10-CM | POA: Diagnosis present

## 2020-12-16 DIAGNOSIS — Z96651 Presence of right artificial knee joint: Secondary | ICD-10-CM | POA: Diagnosis not present

## 2020-12-16 DIAGNOSIS — Z96653 Presence of artificial knee joint, bilateral: Secondary | ICD-10-CM | POA: Diagnosis present

## 2020-12-16 DIAGNOSIS — Z7984 Long term (current) use of oral hypoglycemic drugs: Secondary | ICD-10-CM | POA: Diagnosis not present

## 2020-12-16 DIAGNOSIS — I679 Cerebrovascular disease, unspecified: Secondary | ICD-10-CM | POA: Diagnosis present

## 2020-12-16 DIAGNOSIS — I152 Hypertension secondary to endocrine disorders: Secondary | ICD-10-CM | POA: Diagnosis present

## 2020-12-16 DIAGNOSIS — Z96612 Presence of left artificial shoulder joint: Secondary | ICD-10-CM | POA: Diagnosis present

## 2020-12-16 HISTORY — PX: I & D KNEE WITH POLY EXCHANGE: SHX5024

## 2020-12-16 LAB — GLUCOSE, CAPILLARY
Glucose-Capillary: 105 mg/dL — ABNORMAL HIGH (ref 70–99)
Glucose-Capillary: 130 mg/dL — ABNORMAL HIGH (ref 70–99)

## 2020-12-16 LAB — TYPE AND SCREEN
ABO/RH(D): O POS
Antibody Screen: NEGATIVE

## 2020-12-16 LAB — ABO/RH: ABO/RH(D): O POS

## 2020-12-16 SURGERY — IRRIGATION AND DEBRIDEMENT KNEE WITH POLY EXCHANGE
Anesthesia: Monitor Anesthesia Care | Site: Knee | Laterality: Right

## 2020-12-16 MED ORDER — OXYCODONE HCL 5 MG PO TABS
5.0000 mg | ORAL_TABLET | ORAL | Status: DC | PRN
Start: 2020-12-16 — End: 2020-12-17
  Filled 2020-12-16 (×2): qty 2

## 2020-12-16 MED ORDER — ACETAMINOPHEN 500 MG PO TABS
1000.0000 mg | ORAL_TABLET | Freq: Once | ORAL | Status: AC
  Administered 2020-12-16: 1000 mg via ORAL
  Filled 2020-12-16: qty 2

## 2020-12-16 MED ORDER — LACTATED RINGERS IV SOLN: INTRAVENOUS | Status: DC

## 2020-12-16 MED ORDER — ALUM & MAG HYDROXIDE-SIMETH 200-200-20 MG/5ML PO SUSP: 30.0000 mL | ORAL | Status: DC | PRN

## 2020-12-16 MED ORDER — ONDANSETRON HCL 4 MG/2ML IJ SOLN
INTRAMUSCULAR | Status: AC
  Filled 2020-12-16: qty 2

## 2020-12-16 MED ORDER — CHLORHEXIDINE GLUCONATE 0.12 % MT SOLN
15.0000 mL | Freq: Once | OROMUCOSAL | Status: AC
  Administered 2020-12-16: 15 mL via OROMUCOSAL

## 2020-12-16 MED ORDER — OXYCODONE HCL 5 MG/5ML PO SOLN: 10.0000 mg | Freq: Once | ORAL | Status: DC | PRN

## 2020-12-16 MED ORDER — OXYCODONE HCL 5 MG PO TABS: 5.0000 mg | ORAL_TABLET | Freq: Once | ORAL | Status: DC | PRN

## 2020-12-16 MED ORDER — OXYCODONE HCL 5 MG/5ML PO SOLN: 5.0000 mg | Freq: Once | ORAL | Status: DC | PRN

## 2020-12-16 MED ORDER — ORAL CARE MOUTH RINSE: 15.0000 mL | Freq: Once | OROMUCOSAL | Status: AC

## 2020-12-16 MED ORDER — PROPOFOL 10 MG/ML IV BOLUS
INTRAVENOUS | Status: DC | PRN
  Administered 2020-12-16: 20 mg via INTRAVENOUS
  Administered 2020-12-16: 50 mg via INTRAVENOUS
  Administered 2020-12-16: 30 mg via INTRAVENOUS

## 2020-12-16 MED ORDER — 0.9 % SODIUM CHLORIDE (POUR BTL) OPTIME
TOPICAL | Status: DC | PRN
  Administered 2020-12-16: 1000 mL

## 2020-12-16 MED ORDER — BISACODYL 10 MG RE SUPP: 10.0000 mg | Freq: Every day | RECTAL | Status: DC | PRN

## 2020-12-16 MED ORDER — GABAPENTIN 400 MG PO CAPS
800.0000 mg | ORAL_CAPSULE | Freq: Every day | ORAL | Status: DC
  Administered 2020-12-16: 800 mg via ORAL
  Filled 2020-12-16: qty 2

## 2020-12-16 MED ORDER — CHLORHEXIDINE GLUCONATE 0.12 % MT SOLN: 15.0000 mL | Freq: Once | OROMUCOSAL | Status: DC

## 2020-12-16 MED ORDER — ORAL CARE MOUTH RINSE: 15.0000 mL | Freq: Once | OROMUCOSAL | Status: DC

## 2020-12-16 MED ORDER — FENTANYL CITRATE (PF) 100 MCG/2ML IJ SOLN
INTRAMUSCULAR | Status: AC
  Filled 2020-12-16: qty 2

## 2020-12-16 MED ORDER — OXYCODONE HCL 5 MG PO TABS: 10.0000 mg | ORAL_TABLET | Freq: Once | ORAL | Status: DC | PRN

## 2020-12-16 MED ORDER — TIZANIDINE HCL 4 MG PO TABS
4.0000 mg | ORAL_TABLET | Freq: Three times a day (TID) | ORAL | Status: DC | PRN
  Administered 2020-12-16: 4 mg via ORAL
  Filled 2020-12-16: qty 1

## 2020-12-16 MED ORDER — ONDANSETRON HCL 4 MG PO TABS
4.0000 mg | ORAL_TABLET | Freq: Four times a day (QID) | ORAL | Status: DC | PRN
Start: 2020-12-16 — End: 2020-12-17

## 2020-12-16 MED ORDER — FENTANYL CITRATE (PF) 100 MCG/2ML IJ SOLN
50.0000 ug | INTRAMUSCULAR | Status: DC
  Administered 2020-12-16: 100 ug via INTRAVENOUS
  Filled 2020-12-16: qty 2

## 2020-12-16 MED ORDER — DIPHENHYDRAMINE HCL 12.5 MG/5ML PO ELIX: 12.5000 mg | ORAL_SOLUTION | ORAL | Status: DC | PRN

## 2020-12-16 MED ORDER — LOSARTAN POTASSIUM 50 MG PO TABS
50.0000 mg | ORAL_TABLET | Freq: Every day | ORAL | Status: DC
  Administered 2020-12-17: 50 mg via ORAL
  Filled 2020-12-16: qty 1

## 2020-12-16 MED ORDER — CEFAZOLIN SODIUM-DEXTROSE 2-4 GM/100ML-% IV SOLN
2.0000 g | INTRAVENOUS | Status: AC
  Administered 2020-12-16: 2 g via INTRAVENOUS

## 2020-12-16 MED ORDER — DOCUSATE SODIUM 100 MG PO CAPS
100.0000 mg | ORAL_CAPSULE | Freq: Two times a day (BID) | ORAL | Status: DC
  Administered 2020-12-16 – 2020-12-17 (×2): 100 mg via ORAL
  Filled 2020-12-16 (×2): qty 1

## 2020-12-16 MED ORDER — HYDROMORPHONE HCL 1 MG/ML IJ SOLN
0.5000 mg | INTRAMUSCULAR | Status: DC | PRN
Start: 2020-12-16 — End: 2020-12-16
  Administered 2020-12-16 (×3): 0.5 mg via INTRAVENOUS

## 2020-12-16 MED ORDER — ONDANSETRON HCL 4 MG/2ML IJ SOLN
INTRAMUSCULAR | Status: DC | PRN
  Administered 2020-12-16: 4 mg via INTRAVENOUS

## 2020-12-16 MED ORDER — BUPIVACAINE IN DEXTROSE 0.75-8.25 % IT SOLN
INTRATHECAL | Status: DC | PRN
  Administered 2020-12-16: 1.6 mL via INTRATHECAL

## 2020-12-16 MED ORDER — POVIDONE-IODINE 10 % EX SWAB: 2.0000 "application " | Freq: Once | CUTANEOUS | Status: DC

## 2020-12-16 MED ORDER — DEXAMETHASONE SODIUM PHOSPHATE 10 MG/ML IJ SOLN
INTRAMUSCULAR | Status: DC | PRN
  Administered 2020-12-16: 4 mg via INTRAVENOUS

## 2020-12-16 MED ORDER — PROPOFOL 500 MG/50ML IV EMUL
INTRAVENOUS | Status: DC | PRN
  Administered 2020-12-16: 75 ug/kg/min via INTRAVENOUS

## 2020-12-16 MED ORDER — KETOROLAC TROMETHAMINE 15 MG/ML IJ SOLN
7.5000 mg | Freq: Four times a day (QID) | INTRAMUSCULAR | Status: AC
  Administered 2020-12-16 – 2020-12-17 (×4): 7.5 mg via INTRAVENOUS
  Filled 2020-12-16 (×4): qty 1

## 2020-12-16 MED ORDER — LIDOCAINE 2% (20 MG/ML) 5 ML SYRINGE
INTRAMUSCULAR | Status: AC
  Filled 2020-12-16: qty 5

## 2020-12-16 MED ORDER — PROPOFOL 1000 MG/100ML IV EMUL
INTRAVENOUS | Status: AC
  Filled 2020-12-16: qty 100

## 2020-12-16 MED ORDER — EPHEDRINE 5 MG/ML INJ
INTRAVENOUS | Status: AC
  Filled 2020-12-16: qty 20

## 2020-12-16 MED ORDER — PHENYLEPHRINE 40 MCG/ML (10ML) SYRINGE FOR IV PUSH (FOR BLOOD PRESSURE SUPPORT)
PREFILLED_SYRINGE | INTRAVENOUS | Status: DC | PRN
  Administered 2020-12-16 (×4): 80 ug via INTRAVENOUS

## 2020-12-16 MED ORDER — LIDOCAINE HCL (CARDIAC) PF 100 MG/5ML IV SOSY: PREFILLED_SYRINGE | INTRAVENOUS | Status: DC | PRN

## 2020-12-16 MED ORDER — EPHEDRINE SULFATE-NACL 50-0.9 MG/10ML-% IV SOSY
PREFILLED_SYRINGE | INTRAVENOUS | Status: DC | PRN
  Administered 2020-12-16 (×3): 10 mg via INTRAVENOUS

## 2020-12-16 MED ORDER — POLYETHYLENE GLYCOL 3350 17 G PO PACK: 17.0000 g | PACK | Freq: Every day | ORAL | Status: DC | PRN

## 2020-12-16 MED ORDER — HYDROMORPHONE HCL 1 MG/ML IJ SOLN
0.2500 mg | INTRAMUSCULAR | Status: DC | PRN
  Administered 2020-12-16: 0.5 mg via INTRAVENOUS

## 2020-12-16 MED ORDER — ONDANSETRON HCL 4 MG/2ML IJ SOLN: 4.0000 mg | Freq: Four times a day (QID) | INTRAMUSCULAR | Status: DC | PRN

## 2020-12-16 MED ORDER — DEXAMETHASONE SODIUM PHOSPHATE 10 MG/ML IJ SOLN
INTRAMUSCULAR | Status: DC | PRN
  Administered 2020-12-16: 10 mg

## 2020-12-16 MED ORDER — CEFAZOLIN SODIUM-DEXTROSE 1-4 GM/50ML-% IV SOLN
1.0000 g | Freq: Four times a day (QID) | INTRAVENOUS | Status: AC
  Administered 2020-12-16 (×2): 1 g via INTRAVENOUS
  Filled 2020-12-16 (×2): qty 50

## 2020-12-16 MED ORDER — PANTOPRAZOLE SODIUM 40 MG PO TBEC
40.0000 mg | DELAYED_RELEASE_TABLET | Freq: Every day | ORAL | Status: DC
  Administered 2020-12-16 – 2020-12-17 (×2): 40 mg via ORAL
  Filled 2020-12-16 (×2): qty 1

## 2020-12-16 MED ORDER — PHENOL 1.4 % MT LIQD: 1.0000 | OROMUCOSAL | Status: DC | PRN

## 2020-12-16 MED ORDER — MENTHOL 3 MG MT LOZG: 1.0000 | LOZENGE | OROMUCOSAL | Status: DC | PRN

## 2020-12-16 MED ORDER — SODIUM CHLORIDE 0.9 % IV SOLN: INTRAVENOUS | Status: DC

## 2020-12-16 MED ORDER — PHENYLEPHRINE 40 MCG/ML (10ML) SYRINGE FOR IV PUSH (FOR BLOOD PRESSURE SUPPORT)
PREFILLED_SYRINGE | INTRAVENOUS | Status: AC
  Filled 2020-12-16: qty 20

## 2020-12-16 MED ORDER — LIDOCAINE 2% (20 MG/ML) 5 ML SYRINGE
INTRAMUSCULAR | Status: DC | PRN
  Administered 2020-12-16: 20 mg via INTRAVENOUS

## 2020-12-16 MED ORDER — FENTANYL CITRATE (PF) 100 MCG/2ML IJ SOLN
INTRAMUSCULAR | Status: DC | PRN
  Administered 2020-12-16 (×2): 25 ug via INTRAVENOUS
  Administered 2020-12-16: 50 ug via INTRAVENOUS

## 2020-12-16 MED ORDER — ATORVASTATIN CALCIUM 20 MG PO TABS
20.0000 mg | ORAL_TABLET | Freq: Every day | ORAL | Status: DC
  Administered 2020-12-17: 20 mg via ORAL
  Filled 2020-12-16: qty 1

## 2020-12-16 MED ORDER — METOCLOPRAMIDE HCL 5 MG/ML IJ SOLN: 5.0000 mg | Freq: Three times a day (TID) | INTRAMUSCULAR | Status: DC | PRN

## 2020-12-16 MED ORDER — OXYCODONE HCL 5 MG PO TABS
10.0000 mg | ORAL_TABLET | ORAL | Status: DC | PRN
  Administered 2020-12-16 (×3): 15 mg via ORAL
  Administered 2020-12-17 (×2): 10 mg via ORAL
  Administered 2020-12-17: 15 mg via ORAL
  Filled 2020-12-16 (×4): qty 3

## 2020-12-16 MED ORDER — ASPIRIN 81 MG PO CHEW
81.0000 mg | CHEWABLE_TABLET | Freq: Two times a day (BID) | ORAL | Status: DC
  Administered 2020-12-16 – 2020-12-17 (×2): 81 mg via ORAL
  Filled 2020-12-16 (×2): qty 1

## 2020-12-16 MED ORDER — ACETAMINOPHEN 325 MG PO TABS: 325.0000 mg | ORAL_TABLET | Freq: Four times a day (QID) | ORAL | Status: DC | PRN

## 2020-12-16 MED ORDER — METOCLOPRAMIDE HCL 5 MG PO TABS: 5.0000 mg | ORAL_TABLET | Freq: Three times a day (TID) | ORAL | Status: DC | PRN

## 2020-12-16 MED ORDER — SODIUM CHLORIDE 0.9 % IR SOLN
Status: DC | PRN
  Administered 2020-12-16: 1000 mL

## 2020-12-16 MED ORDER — HYDROMORPHONE HCL 1 MG/ML IJ SOLN: 1.0000 mg | INTRAMUSCULAR | Status: DC | PRN

## 2020-12-16 MED ORDER — MIDAZOLAM HCL 2 MG/2ML IJ SOLN
1.0000 mg | INTRAMUSCULAR | Status: DC
Start: 2020-12-16 — End: 2020-12-16
  Administered 2020-12-16: 2 mg via INTRAVENOUS
  Filled 2020-12-16: qty 2

## 2020-12-16 MED ORDER — ROPIVACAINE HCL 5 MG/ML IJ SOLN
INTRAMUSCULAR | Status: DC | PRN
  Administered 2020-12-16: 30 mL via PERINEURAL

## 2020-12-16 MED ORDER — DEXAMETHASONE SODIUM PHOSPHATE 10 MG/ML IJ SOLN
INTRAMUSCULAR | Status: AC
  Filled 2020-12-16: qty 1

## 2020-12-16 MED ORDER — PROMETHAZINE HCL 25 MG/ML IJ SOLN: 6.2500 mg | INTRAMUSCULAR | Status: DC | PRN

## 2020-12-16 MED ORDER — HYDROMORPHONE HCL 1 MG/ML IJ SOLN
INTRAMUSCULAR | Status: AC
  Filled 2020-12-16: qty 2

## 2020-12-16 MED ORDER — TRANEXAMIC ACID-NACL 1000-0.7 MG/100ML-% IV SOLN
1000.0000 mg | INTRAVENOUS | Status: AC
  Administered 2020-12-16: 1000 mg via INTRAVENOUS

## 2020-12-16 SURGICAL SUPPLY — 44 items
BAG ZIPLOCK 12X15 (MISCELLANEOUS) ×3 IMPLANT
BNDG ELASTIC 4X5.8 VLCR STR LF (GAUZE/BANDAGES/DRESSINGS) ×3 IMPLANT
BNDG ELASTIC 6X5.8 VLCR STR LF (GAUZE/BANDAGES/DRESSINGS) ×3 IMPLANT
CLOSURE WOUND 1/2 X4 (GAUZE/BANDAGES/DRESSINGS) ×2
COVER SURGICAL LIGHT HANDLE (MISCELLANEOUS) ×3 IMPLANT
COVER WAND RF STERILE (DRAPES) IMPLANT
CUFF TOURN SGL QUICK 34 (TOURNIQUET CUFF) ×2
CUFF TRNQT CYL 34X4.125X (TOURNIQUET CUFF) ×1 IMPLANT
DECANTER SPIKE VIAL GLASS SM (MISCELLANEOUS) ×3 IMPLANT
DRAPE U-SHAPE 47X51 STRL (DRAPES) ×3 IMPLANT
DRSG ADAPTIC 3X8 NADH LF (GAUZE/BANDAGES/DRESSINGS) ×3 IMPLANT
DRSG PAD ABDOMINAL 8X10 ST (GAUZE/BANDAGES/DRESSINGS) ×6 IMPLANT
DURAPREP 26ML APPLICATOR (WOUND CARE) ×3 IMPLANT
ELECT REM PT RETURN 15FT ADLT (MISCELLANEOUS) ×3 IMPLANT
GAUZE SPONGE 4X4 12PLY STRL (GAUZE/BANDAGES/DRESSINGS) ×6 IMPLANT
GAUZE XEROFORM 1X8 LF (GAUZE/BANDAGES/DRESSINGS) ×3 IMPLANT
GLOVE SRG 8 PF TXTR STRL LF DI (GLOVE) ×2 IMPLANT
GLOVE SURG ENC MOIS LTX SZ7.5 (GLOVE) ×3 IMPLANT
GLOVE SURG LTX SZ8 (GLOVE) ×3 IMPLANT
GLOVE SURG UNDER POLY LF SZ8 (GLOVE) ×4
GOWN STRL REUS W/TWL XL LVL3 (GOWN DISPOSABLE) ×6 IMPLANT
HANDPIECE INTERPULSE COAX TIP (DISPOSABLE) ×2
IMMOBILIZER KNEE 20 (SOFTGOODS)
IMMOBILIZER KNEE 20 THIGH 36 (SOFTGOODS) IMPLANT
INSERT TIB BEAR TRI  X3 16 4 (Insert) ×2 IMPLANT
INSERT TIB BEAR TRI X3 16 4 (Insert) ×1 IMPLANT
KIT TURNOVER KIT A (KITS) ×3 IMPLANT
NS IRRIG 1000ML POUR BTL (IV SOLUTION) ×3 IMPLANT
PACK TOTAL KNEE CUSTOM (KITS) ×3 IMPLANT
PADDING CAST COTTON 6X4 STRL (CAST SUPPLIES) ×3 IMPLANT
PENCIL SMOKE EVACUATOR (MISCELLANEOUS) IMPLANT
PROTECTOR NERVE ULNAR (MISCELLANEOUS) ×3 IMPLANT
SET HNDPC FAN SPRY TIP SCT (DISPOSABLE) ×1 IMPLANT
SET PAD KNEE POSITIONER (MISCELLANEOUS) ×3 IMPLANT
STAPLER VISISTAT 35W (STAPLE) ×3 IMPLANT
STRIP CLOSURE SKIN 1/2X4 (GAUZE/BANDAGES/DRESSINGS) ×4 IMPLANT
SUT MNCRL AB 4-0 PS2 18 (SUTURE) ×3 IMPLANT
SUT VIC AB 1 CT1 36 (SUTURE) ×9 IMPLANT
SUT VIC AB 2-0 CT1 27 (SUTURE) ×6
SUT VIC AB 2-0 CT1 TAPERPNT 27 (SUTURE) ×3 IMPLANT
SUT VICRYL 0 27 CT2 27 ABS (SUTURE) ×3 IMPLANT
SWAB COLLECTION DEVICE MRSA (MISCELLANEOUS) ×3 IMPLANT
SWAB CULTURE ESWAB REG 1ML (MISCELLANEOUS) ×3 IMPLANT
TRAY FOLEY MTR SLVR 16FR STAT (SET/KITS/TRAYS/PACK) ×3 IMPLANT

## 2020-12-16 NOTE — Anesthesia Procedure Notes (Signed)
Spinal  Patient location during procedure: OR Start time: 12/16/2020 10:41 AM End time: 12/16/2020 10:52 AM Reason for block: surgical anesthesia Staffing Performed: anesthesiologist  Anesthesiologist: Pervis Hocking, DO Preanesthetic Checklist Completed: patient identified, IV checked, risks and benefits discussed, surgical consent, monitors and equipment checked, pre-op evaluation and timeout performed Spinal Block Patient position: sitting Prep: DuraPrep and site prepped and draped Patient monitoring: cardiac monitor, continuous pulse ox and blood pressure Approach: midline Location: L3-4 Injection technique: single-shot Needle Needle type: Pencan  Needle gauge: 24 G Needle length: 9 cm Assessment Sensory level: T6 Events: CSF return and second provider Additional Notes Functioning IV was confirmed and monitors were applied. Sterile prep and drape, including hand hygiene and sterile gloves were used. The patient was positioned and the spine was prepped. The skin was anesthetized with lidocaine.  Free flow of clear CSF was obtained prior to injecting local anesthetic into the CSF.  The spinal needle aspirated freely following injection.  The needle was carefully withdrawn.  The patient tolerated the procedure well.

## 2020-12-16 NOTE — Anesthesia Preprocedure Evaluation (Addendum)
Anesthesia Evaluation  Patient identified by MRN, date of birth, ID band Patient awake    Reviewed: Allergy & Precautions, NPO status , Patient's Chart, lab work & pertinent test results  Airway Mallampati: III  TM Distance: >3 FB Neck ROM: Full    Dental no notable dental hx. (+) Edentulous Upper, Missing, Dental Advisory Given,    Pulmonary Current Smoker and Patient abstained from smoking.,  Current smoker, 23 pack year history 4cigg/d No inhalers    Pulmonary exam normal breath sounds clear to auscultation       Cardiovascular hypertension, Pt. on medications Normal cardiovascular exam Rhythm:Regular Rate:Normal     Neuro/Psych PSYCHIATRIC DISORDERS Depression CVA (slurred speech, occasional L weakness), Residual Symptoms    GI/Hepatic negative GI ROS, Neg liver ROS,   Endo/Other  diabetes, Well Controlled, Type 2, Oral Hypoglycemic AgentsObesity BMI 31 a1c 6.2  Renal/GU negative Renal ROS  negative genitourinary   Musculoskeletal  (+) Arthritis , Osteoarthritis,  Right knee poly-liner wear, instability Chronic pain: takes percocet 58m four times a day since 2010   Abdominal (+) + obese,   Peds  Hematology negative hematology ROS (+) hct 43, plt 246   Anesthesia Other Findings   Reproductive/Obstetrics negative OB ROS                           Anesthesia Physical Anesthesia Plan  ASA: 3  Anesthesia Plan: Spinal, Regional and MAC   Post-op Pain Management:    Induction:   PONV Risk Score and Plan: 2 and Propofol infusion and TIVA  Airway Management Planned: Natural Airway and Nasal Cannula  Additional Equipment: None  Intra-op Plan:   Post-operative Plan:   Informed Consent: I have reviewed the patients History and Physical, chart, labs and discussed the procedure including the risks, benefits and alternatives for the proposed anesthesia with the patient or authorized  representative who has indicated his/her understanding and acceptance.     Dental advisory given  Plan Discussed with: CRNA  Anesthesia Plan Comments:        Anesthesia Quick Evaluation

## 2020-12-16 NOTE — Progress Notes (Signed)
AssistedDr. Beth Finucane with right, ultrasound guided, adductor canal block. Side rails up, monitors on throughout procedure. See vital signs in flow sheet. Tolerated Procedure well.  

## 2020-12-16 NOTE — Op Note (Signed)
NAMESHASHANA, [place] MEDICAL RECORD NO: [id number] ACCOUNT NO: [id number] DATE OF BIRTH: 04/22/1956 FACILITY: [id number] PHYSICIAN: Lind Guest. Ninfa Linden, MD  Operative Report   DATE OF PROCEDURE: 12/16/2020  PREOPERATIVE DIAGNOSIS:  Right total knee instability.  POSTOPERATIVE DIAGNOSIS:  Right total knee instability.  PROCEDURE PERFORMED:  Polyethylene liner exchange, right knee, with upsizing polyethylene liner.  IMPLANTS:  [employer]ryker Triathlon fixed-bearing polyethylene liner with removal of a 13 mm liner and replacing this with a 16 mm liner for a size 4 tibial tray.  SURGEON:  Lind Guest. Ninfa Linden, MD  ASSISTANT:  Benita Stabile, PA-C  ANESTHESIA: 1.  Right lower extremity adductor canal block. 2.  Spinal. 3.  General via LMA.  ANTIBIOTICS:  2 g IV Ancef.  ESTIMATED BLOOD LOSS:  Less than 50 mL.  COMPLICATIONS:  None.  INDICATIONS:  The patient is a 65 year old female who has a history of bilateral total knee arthroplasties that were done elsewhere.  These were press-fit arthroplasties and were both done at different times and using different products and companies.   She reports bilateral daily knee pain, and she is on chronic narcotic pain medications.  X-rays of both knees show well-seated total knee arthroplasties that are press fit.  The right knee shows no evidence of loosening nor does left knee.  Three-phase  bone scan also shows no evidence of product loosening.  On exam, her right knee does have instability to varus and valgus stressing, and given the instability of that knee, she is having some symptomatic synovitis as well. We felt the best option at this  point would be upsizing her polyethylene liner.  We had a long and thorough discussion about this.  Her radiographs and bone scan showed the implants were well seated.  We had a long and thorough discussion about what this surgery means as well as the  risks and benefits of surgery, and she does wish to  proceed given the instability of the right knee.  DESCRIPTION OF PROCEDURE:  After informed consent was obtained, appropriate right knee was marked.  Anesthesia obtained adductor canal block in the holding room of her right lower extremity.  She was then brought to the operating room and sat up on the  operating table where spinal anesthesia was obtained, a Foley catheter was placed, and a nonsterile tourniquet was placed around her upper right thigh.  I did preoperatively assess her knee again.  With her knee fully extended, there was play with varus  and valgus stressing, and at 30 degrees of flexion, there was a lot of play with the knee as well.  She had full flexion and extension of the knee with no effusion.  Her right knee, thigh, leg, ankle, and foot were prepped and draped with DuraPrep and  sterile drapes.  A timeout was called.  She was identified as correct patient, correct right knee.  We then used Esmarch to wrap out the leg and tourniquet was inflated to 300 mm of pressure.  We then made a direct midline incision over the knee, carried  this proximally and distally through her old incision.  We dissected down the knee joint, carried out a medial parapatellar arthrotomy and did not find any significant joint effusion at all or worrisome features of the implant itself.  Once we removed  scar tissue, we had the knee in a flexed position and was able to easily remove her previous polyethylene liner, which was a fixed-bearing posterior cruciate-stabilizing knee insert.  It was  a 13 mm thickness for a size 4 tibial tray.  The metal tibial  tray and the metal femur were well seated with no evidence of loosening at all.  We then irrigated the knee with normal saline solution using pulsatile lavage to remove some minimal scar tissue from the posterior aspect of the knee.  Given that it was a  13 mm thickness polyethylene liner, we felt that it was appropriate to go up to a 16 mm thickness liner for a  size 4 tibia.  We placed this without difficulty, and varus and valgus stressing was solid and felt the knee to be very stable after that and  tight.  Her extension was not lost either and she had full extension.  We then let the tourniquet down.  Hemostasis obtained with electrocautery.  We closed the arthrotomy with interrupted #1 Vicryl suture followed by 0 Vicryl to close deep tissue and  2-0 Vicryl to close the subcutaneous tissue.  The skin was closed with staples.  A well-padded sterile dressing was applied.  She was awakened, extubated, and taken to recovery room in stable condition with all final counts being correct.  No  complications noted.  Of note, Benita Stabile, PA-C's assistance was crucial in facilitating all aspects of this case.   [facility] D: 12/16/2020 11:49:31 am T: 12/16/2020 6:12:00 pm  JOB: [id number]/ [id number]

## 2020-12-16 NOTE — Interval H&P Note (Signed)
History and Physical Interval Note: The patient understands she is here today for evaluation exploration of her right total knee arthroplasty with the goal of upsizing the polyethylene liner due to instability of the knee.  There has been no acute or interval change in her medical status.  See recent H&P.  The risks and benefits of surgery have been explained in detail and informed consent is obtained.  12/16/2020 9:19 AM  Monica Watson  has presented today for surgery, with the diagnosis of RIGHT KNEE POLY-LINER WEAR, INSTABILITY.  The various methods of treatment have been discussed with the patient and family. After consideration of risks, benefits and other options for treatment, the patient has consented to  Procedure(s): POLY EXCHANGE RIGHT KNEE (Right) as a surgical intervention.  The patient's history has been reviewed, patient examined, no change in status, stable for surgery.  I have reviewed the patient's chart and labs.  Questions were answered to the patient's satisfaction.     [name]

## 2020-12-16 NOTE — Brief Op Note (Signed)
12/16/2020  11:51 AM  PATIENT:  Monica Watson  65 y.o. female  PRE-OPERATIVE DIAGNOSIS:  RIGHT KNEE POLY-LINER WEAR, INSTABILITY  POST-OPERATIVE DIAGNOSIS:  RIGHT KNEE POLY-LINER WEAR, INSTABILITY  PROCEDURE:  Procedure(s): POLY EXCHANGE RIGHT KNEE (Right)  SURGEON:  Surgeon(s) and Role:    * [name], MD - Primary  PHYSICIAN ASSISTANT:  Benita Stabile, PA-C  ANESTHESIA:   regional, spinal, and general  EBL:  50 mL   COUNTS:  YES  TOURNIQUET:   Total Tourniquet Time Documented: Thigh (Right) - 20 minutes Total: Thigh (Right) - 20 minutes   DICTATION: .Other Dictation: Dictation Number [id number]  PLAN OF CARE: Admit for overnight observation  PATIENT DISPOSITION:  PACU - hemodynamically stable.   Delay start of Pharmacological VTE agent (>24hrs) due to surgical blood loss or risk of bleeding: no

## 2020-12-16 NOTE — Transfer of Care (Signed)
Immediate Anesthesia Transfer of Care Note  Patient: Monica Watson  Procedure(s) Performed: POLY EXCHANGE RIGHT KNEE (Right: Knee)  Patient Location: PACU  Anesthesia Type:Spinal and GA combined with regional for post-op pain  Level of Consciousness: drowsy and patient cooperative  Airway & Oxygen Therapy: Patient Spontanous Breathing and Patient connected to face mask oxygen  Post-op Assessment: Report given to RN and Post -op Vital signs reviewed and stable  Post vital signs: Reviewed and stable  Last Vitals:  Vitals Value Taken Time  BP 122/74   Temp    Pulse 82 12/16/20 1214  Resp 11 12/16/20 1214  SpO2 100 % 12/16/20 1214  Vitals shown include unvalidated device data.  Last Pain:  Vitals:   12/16/20 0825  TempSrc: Oral  PainSc: 7       Patients Stated Pain Goal: 3 (79/39/03 0092)  Complications: No notable events documented.

## 2020-12-16 NOTE — Anesthesia Procedure Notes (Signed)
Anesthesia Regional Block: Adductor canal block   Pre-Anesthetic Checklist: , timeout performed,  Correct Patient, Correct Site, Correct Laterality,  Correct Procedure, Correct Position, site marked,  Risks and benefits discussed,  Surgical consent,  Pre-op evaluation,  At surgeon's request and post-op pain management  Laterality: Right  Prep: Maximum Sterile Barrier Precautions used, chloraprep       Needles:  Injection technique: Single-shot  Needle Type: Echogenic Stimulator Needle     Needle Length: 9cm  Needle Gauge: 22     Additional Needles:   Procedures:,,,, ultrasound used (permanent image in chart),,    Narrative:  Start time: 12/16/2020 9:30 AM End time: 12/16/2020 9:35 AM Injection made incrementally with aspirations every 5 mL.  Performed by: Personally  Anesthesiologist: Pervis Hocking, DO  Additional Notes: Monitors applied. No increased pain on injection. No increased resistance to injection. Injection made in 5cc increments. Good needle visualization. Patient tolerated procedure well.

## 2020-12-16 NOTE — Evaluation (Signed)
Physical Therapy Evaluation Patient Details Name: Monica Watson MRN: [id number] DOB: 1956-06-03 Today's Date: 12/16/2020   History of Present Illness  Pt s/p poly revision of R TKR and with hx of CVA with mild residual L sided weakness, DM, Bil TKR and Bil TSR  Clinical Impression  Pt s/p R TKR revision and presents with decreased R LE strength/ROM, post op pain and premorbid deconditioning limiting functional mobility.  Pt hopes to progress to return to apartment with assist of sister initially and HHPT follow up.    Follow Up Recommendations Home health PT;Follow surgeon's recommendation for DC plan and follow-up therapies    Equipment Recommendations  Rolling walker with 5" wheels    Recommendations for Other Services       Precautions / Restrictions Precautions Precautions: Knee;Fall Restrictions Weight Bearing Restrictions: No Other Position/Activity Restrictions: WBAT      Mobility  Bed Mobility Overal bed mobility: Needs Assistance Bed Mobility: Supine to Sit     Supine to sit: Mod assist;+2 for physical assistance;+2 for safety/equipment     General bed mobility comments: Increased time with cues for sequence and physical assist to manage R LE, to bring trunk to upright and to complete rotation using bed pad    Transfers Overall transfer level: Needs assistance Equipment used: Rolling walker (2 wheeled) Transfers: Sit to/from Stand Sit to Stand: Min assist;+2 physical assistance;+2 safety/equipment;From elevated surface         General transfer comment: cues for LE management and use of UEs to self assist  Ambulation/Gait Ambulation/Gait assistance: Min assist;+2 safety/equipment;+2 physical assistance;Mod assist Gait Distance (Feet): 26 Feet Assistive device: Rolling walker (2 wheeled) Gait Pattern/deviations: Step-to pattern;Decreased step length - right;Decreased step length - left;Shuffle;Trunk flexed Gait velocity: decr   General Gait Details:  Increased time with cues for sequence, posture and position from [employer]            Wheelchair Mobility    Modified Rankin (Stroke Patients Only)       Balance Overall balance assessment: Needs assistance Sitting-balance support: Single extremity supported;Feet supported Sitting balance-Leahy Scale: Fair     Standing balance support: Bilateral upper extremity supported Standing balance-Leahy Scale: Poor                               Pertinent Vitals/Pain Pain Assessment: 0-10 Pain Score: 6  Pain Location: R knee Pain Descriptors / Indicators: Aching;Sore Pain Intervention(s): Limited activity within patient's tolerance;Monitored during session;Premedicated before session    [place] expects to be discharged to:: Private residence Living Arrangements: Alone Available Help at Discharge: Family Type of Home: Apartment Home Access: Stairs to enter Entrance Stairs-Rails: Right Entrance Stairs-Number of Steps: 3 Home Layout: One level Home Equipment: [employer] - single point Additional Comments: Sister will stay with pt until Tuesday    Prior Function Level of Independence: Independent with assistive device(s)               Hand Dominance   Dominant Hand: Right    Extremity/Trunk Assessment   Upper Extremity Assessment Upper Extremity Assessment: Overall WFL for tasks assessed    Lower Extremity Assessment Lower Extremity Assessment: LLE deficits/detail;RLE deficits/detail LLE Deficits / Details: residual weakness from CVA per pt but [facility]; pt reports fatigues easily and gets shakey    Cervical / Trunk Assessment Cervical / Trunk Assessment: Normal  Communication   Communication: No difficulties  Cognition Arousal/Alertness: Awake/alert Behavior During Therapy:  Flat affect Overall Cognitive Status: Within Functional Limits for tasks assessed                                        General Comments       Exercises Total Joint Exercises Ankle Circles/Pumps: AROM;Both;15 reps;Supine   Assessment/Plan    PT Assessment Patient needs continued PT services  PT Problem List Decreased strength;Decreased range of motion;Decreased activity tolerance;Decreased balance;Decreased mobility;Decreased knowledge of use of DME       PT Treatment Interventions DME instruction;Gait training;Stair training;Therapeutic activities;Therapeutic exercise;Functional mobility training;Balance training;Patient/family education    PT Goals (Current goals can be found in the Care Plan section)  Acute Rehab PT Goals Patient Stated Goal: Regain IND PT Goal Formulation: With patient Time For Goal Achievement: 12/23/20 Potential to Achieve Goals: Good    Frequency 7X/week   Barriers to discharge        Co-evaluation               AM-PAC PT "6 Clicks" Mobility  Outcome Measure Help needed turning from your back to your side while in a flat bed without using bedrails?: A Lot Help needed moving from lying on your back to sitting on the side of a flat bed without using bedrails?: A Lot Help needed moving to and from a bed to a chair (including a wheelchair)?: A Lot Help needed standing up from a chair using your arms (e.g., wheelchair or bedside chair)?: A Lot Help needed to walk in hospital room?: A Lot Help needed climbing 3-5 steps with a railing? : A Lot 6 Click Score: 12    End of Session Equipment Utilized During Treatment: Gait belt Activity Tolerance: Patient tolerated treatment well;Patient limited by fatigue Patient left: in chair;with call bell/phone within reach;with chair alarm set Nurse Communication: Mobility status PT Visit Diagnosis: Difficulty in walking, not elsewhere classified (R26.2);Pain Pain - Right/Left: Right Pain - part of body: Knee    Time: 0569-7948 PT Time Calculation (min) (ACUTE ONLY): 32 min   Charges:   PT Evaluation $PT Eval Low Complexity: 1 Low PT  Treatments $Gait Training: 8-22 mins        Debe Coder PT Acute Rehabilitation Services Pager [phone] Office [phone]   [place] 12/16/2020, 5:42 PM

## 2020-12-16 NOTE — Anesthesia Procedure Notes (Signed)
Procedure Name: LMA Insertion Date/Time: 12/16/2020 11:13 AM Performed by: Genelle Bal, CRNA Pre-anesthesia Checklist: Patient identified, Emergency Drugs available, Suction available and Patient being monitored Patient Re-evaluated:Patient Re-evaluated prior to induction Oxygen Delivery Method: Circle system utilized Preoxygenation: Pre-oxygenation with 100% oxygen Induction Type: IV induction Ventilation: Mask ventilation without difficulty LMA: LMA inserted LMA Size: 4.0 Number of attempts: 1 Airway Equipment and Method: Bite block Placement Confirmation: positive ETCO2 Tube secured with: Tape Dental Injury: Teeth and Oropharynx as per pre-operative assessment

## 2020-12-17 MED ORDER — TIZANIDINE HCL 4 MG PO TABS: 4.0000 mg | ORAL_TABLET | Freq: Three times a day (TID) | ORAL | 0 refills | Status: DC | PRN

## 2020-12-17 MED ORDER — OXYCODONE-ACETAMINOPHEN 10-325 MG PO TABS: 1.0000 | ORAL_TABLET | Freq: Four times a day (QID) | ORAL | 0 refills | Status: DC | PRN

## 2020-12-17 MED ORDER — GABAPENTIN 800 MG PO TABS: 800.0000 mg | ORAL_TABLET | Freq: Every day | ORAL | 0 refills | Status: AC

## 2020-12-17 NOTE — Progress Notes (Signed)
Physical Therapy Treatment Patient Details Name: Monica Watson MRN: [id number] DOB: 15-Oct-1955 Today's Date: 12/17/2020    History of Present Illness Pt s/p poly revision of R TKR and with hx of CVA with mild residual L sided weakness, DM, Bil TKR and Bil TSR    PT Comments    Pt progressing well with mobility and eager for dc home.  Pt performed HEP with written instruction provided and reviewed.  Pt up to ambulate in hall and negotiated stairs with rail and QC.  Sister present for session.  Follow Up Recommendations  Home health PT;Follow surgeon's recommendation for DC plan and follow-up therapies     Equipment Recommendations  Rolling walker with 5" wheels    Recommendations for Other Services       Precautions / Restrictions Precautions Precautions: Knee;Fall Restrictions Weight Bearing Restrictions: No Other Position/Activity Restrictions: WBAT    Mobility  Bed Mobility               General bed mobility comments: Pt up in chair and requests back to same    Transfers Overall transfer level: Needs assistance Equipment used: Rolling walker (2 wheeled) Transfers: Sit to/from Stand Sit to Stand: Min guard;Supervision         General transfer comment: cues for LE management and use of UEs to self assist  Ambulation/Gait Ambulation/Gait assistance: Min guard;Supervision Gait Distance (Feet): 100 Feet Assistive device: Rolling walker (2 wheeled) Gait Pattern/deviations: Step-to pattern;Decreased step length - right;Decreased step length - left;Shuffle;Trunk flexed Gait velocity: decr   General Gait Details: Increased time with cues for sequence, posture and position from RW   Stairs Stairs: Yes Stairs assistance: Min assist Stair Management: One rail Left;Forwards;With cane;Step to pattern Number of Stairs: 5 General stair comments: cues for sequence and foot/cane placement   Wheelchair Mobility    Modified Rankin (Stroke Patients Only)        Balance Overall balance assessment: Needs assistance Sitting-balance support: No upper extremity supported;Feet supported Sitting balance-Leahy Scale: Good     Standing balance support: No upper extremity supported Standing balance-Leahy Scale: Fair                              Cognition Arousal/Alertness: Awake/alert Behavior During Therapy: WFL for tasks assessed/performed Overall Cognitive Status: Within Functional Limits for tasks assessed                                        Exercises Total Joint Exercises Ankle Circles/Pumps: AROM;Both;15 reps;Supine Quad Sets: AROM;Both;10 reps;Supine Heel Slides: AAROM;Right;15 reps;Supine Straight Leg Raises: AAROM;AROM;Right;Supine;15 reps    General Comments        Pertinent Vitals/Pain Pain Assessment: 0-10 Pain Score: 5  Pain Location: R knee Pain Descriptors / Indicators: Aching;Sore Pain Intervention(s): Limited activity within patient's tolerance;Monitored during session;Premedicated before session    Home Living                      Prior Function            PT Goals (current goals can now be found in the care plan section) Acute Rehab PT Goals Patient Stated Goal: Regain IND PT Goal Formulation: With patient Time For Goal Achievement: 12/23/20 Potential to Achieve Goals: Good Progress towards PT goals: Progressing toward goals    Frequency    7X/week  PT Plan Current plan remains appropriate    Co-evaluation              AM-PAC PT "6 Clicks" Mobility   Outcome Measure  Help needed turning from your back to your side while in a flat bed without using bedrails?: A Little Help needed moving from lying on your back to sitting on the side of a flat bed without using bedrails?: A Little Help needed moving to and from a bed to a chair (including a wheelchair)?: A Little Help needed standing up from a chair using your arms (e.g., wheelchair or bedside  chair)?: A Little Help needed to walk in hospital room?: A Little Help needed climbing 3-5 steps with a railing? : A Little 6 Click Score: 18    End of Session Equipment Utilized During Treatment: Gait belt Activity Tolerance: Patient tolerated treatment well Patient left: in chair;with call bell/phone within reach;with chair alarm set;with family/visitor present Nurse Communication: Mobility status PT Visit Diagnosis: Difficulty in walking, not elsewhere classified (R26.2);Pain Pain - Right/Left: Right Pain - part of body: Knee     Time: 8416-6063 PT Time Calculation (min) (ACUTE ONLY): 40 min  Charges:  $Gait Training: 8-22 mins $Therapeutic Exercise: 8-22 mins                     [place] Pager [phone] Office [phone]    Densil Ottey 12/17/2020, 4:36 PM

## 2020-12-17 NOTE — Anesthesia Postprocedure Evaluation (Signed)
Anesthesia Post Note  Patient: Monica Watson  Procedure(s) Performed: POLY EXCHANGE RIGHT KNEE (Right: Knee)     Patient location during evaluation: PACU Anesthesia Type: Regional and General Level of consciousness: awake and alert, oriented and patient cooperative Pain management: pain level controlled Vital Signs Assessment: post-procedure vital signs reviewed and stable Respiratory status: spontaneous breathing, nonlabored ventilation and respiratory function stable Cardiovascular status: blood pressure returned to baseline and stable Postop Assessment: no apparent nausea or vomiting Anesthetic complications: no Comments: Spinal placed with good return of CSF, but did not set up properly- conversion to GA/LMA   No notable events documented.  Last Vitals:  Vitals:   12/17/20 0533 12/17/20 1002  BP: (!) 142/91 (!) 150/77  Pulse: 67 67  Resp: 18 18  Temp: 36.8 C   SpO2: 98% 97%    Last Pain:  Vitals:   12/17/20 0830  TempSrc:   PainSc: [place]

## 2020-12-17 NOTE — Progress Notes (Signed)
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  Pt up to ambulate increased distance in hall, to bathroom for toileting and hygiene at sink and initiated HEP.  Pt hopeful for dc home this date.   Follow Up Recommendations  Home health PT;Follow surgeon's recommendation for DC plan and follow-up therapies     Equipment Recommendations  Rolling walker with 5" wheels    Recommendations for Other Services       Precautions / Restrictions Precautions Precautions: Knee;Fall Restrictions Weight Bearing Restrictions: No Other Position/Activity Restrictions: WBAT    Mobility  Bed Mobility Overal bed mobility: Needs Assistance Bed Mobility: Supine to Sit     Supine to sit: Min guard     General bed mobility comments: Increased time with limited use of bed rail    Transfers Overall transfer level: Needs assistance Equipment used: Rolling walker (2 wheeled) Transfers: Sit to/from Stand Sit to Stand: Min assist;Min guard         General transfer comment: cues for LE management and use of UEs to self assist  Ambulation/Gait Ambulation/Gait assistance: Min assist;Min guard Gait Distance (Feet): 120 Feet (and 15' from bathroom) Assistive device: Rolling walker (2 wheeled) Gait Pattern/deviations: Step-to pattern;Decreased step length - right;Decreased step length - left;Shuffle;Trunk flexed Gait velocity: decr   General Gait Details: Increased time with cues for sequence, posture and position from [employer]             Wheelchair Mobility    Modified Rankin (Stroke Patients Only)       Balance Overall balance assessment: Needs  assistance Sitting-balance support: No upper extremity supported;Feet supported Sitting balance-Leahy Scale: Good     Standing balance support: No upper extremity supported Standing balance-Leahy Scale: Fair                              Cognition Arousal/Alertness: Awake/alert Behavior During Therapy: WFL for tasks assessed/performed Overall Cognitive Status: Within Functional Limits for tasks assessed                                        Exercises Total Joint Exercises Ankle Circles/Pumps: AROM;Both;15 reps;Supine Quad Sets: AROM;Both;10 reps;Supine Heel Slides: AAROM;Right;15 reps;Supine Straight Leg Raises: AAROM;AROM;Right;Supine;15 reps Goniometric ROM: AAROM R knee -5 - 35    General Comments        Pertinent Vitals/Pain Pain Assessment: 0-10 Pain Score: 5  Pain Location: R knee Pain Descriptors / Indicators: Aching;Sore Pain Intervention(s): Premedicated before session;Monitored during session;Limited activity within patient's tolerance;Ice applied    Home Living                      Prior Function            PT Goals (current goals can now be found in the care plan section) Acute Rehab PT Goals Patient Stated Goal: Regain IND PT Goal Formulation: With patient Time For Goal Achievement: 12/23/20 Potential to Achieve Goals: Good Progress towards PT goals: Progressing toward  goals    Frequency    7X/week      PT Plan Current plan remains appropriate    Co-evaluation              AM-PAC PT "6 Clicks" Mobility   Outcome Measure  Help needed turning from your back to your side while in a flat bed without using bedrails?: A Little Help needed moving from lying on your back to sitting on the side of a flat bed without using bedrails?: A Little Help needed moving to and from a bed to a chair (including a wheelchair)?: A Little Help needed standing up from a chair using your arms (e.g., wheelchair or bedside  chair)?: A Little Help needed to walk in hospital room?: A Little Help needed climbing 3-5 steps with a railing? : A Lot 6 Click Score: 17    End of Session Equipment Utilized During Treatment: Gait belt Activity Tolerance: Patient tolerated treatment well Patient left: in chair;with call bell/phone within reach;with chair alarm set Nurse Communication: Mobility status PT Visit Diagnosis: Difficulty in walking, not elsewhere classified (R26.2);Pain Pain - Right/Left: Right Pain - part of body: Knee     Time: 3888-2800 PT Time Calculation (min) (ACUTE ONLY): 45 min  Charges:  $Gait Training: 8-22 mins $Therapeutic Exercise: 8-22 mins $Therapeutic Activity: 8-22 mins                     Debe Coder PT Acute Rehabilitation Services Pager [phone] Office [phone]    Monica Watson 12/17/2020, 11:41 AM

## 2020-12-17 NOTE — Discharge Summary (Signed)
Patient ID: Monica Watson MRN: [id number] DOB/AGE: [birth year] 65 y.o.  Admit date: 12/16/2020 Discharge date: 12/17/2020  Admission Diagnoses:  Principal Problem:   Polyethylene wear of right knee prosthesis, subsequent encounter Active Problems:   Status post revision of total replacement of right knee   Discharge Diagnoses:  Same  Past Medical History:  Diagnosis Date   Arthritis    Cerebrovascular disease    Depression    Hypertension    Stroke ([place])    Type 2 diabetes mellitus ([place])     Surgeries: Procedure(s): POLY EXCHANGE RIGHT KNEE on 12/16/2020   Consultants:   Discharged Condition: Improved  Hospital Course: Monica Watson is an 65 y.o. female who was admitted 12/16/2020 for operative treatment ofPolyethylene wear of right knee prosthesis, subsequent encounter. Patient has severe unremitting pain that affects sleep, daily activities, and work/hobbies. After pre-op clearance the patient was taken to the operating room on 12/16/2020 and underwent  Procedure(s): POLY EXCHANGE RIGHT KNEE.    Patient was given perioperative antibiotics:  Anti-infectives (From admission, onward)    Start     Dose/Rate Route Frequency Ordered Stop   12/16/20 1700  ceFAZolin (ANCEF) IVPB 1 g/50 mL premix        1 g 100 mL/hr over 30 Minutes Intravenous Every 6 hours 12/16/20 1336 12/16/20 2242   12/16/20 0815  ceFAZolin (ANCEF) IVPB 2g/100 mL premix        2 g 200 mL/hr over 30 Minutes Intravenous On call to O.R. 12/16/20 0802 12/16/20 1124        Patient was given sequential compression devices, early ambulation, and chemoprophylaxis to prevent DVT.  Patient benefited maximally from hospital stay and there were no complications.    Recent vital signs: Patient Vitals for the past 24 hrs:  BP Temp Temp src Pulse Resp SpO2  12/17/20 1350 111/67 98.2 F (36.8 C) Oral 68 -- 99 %  12/17/20 1002 (!) 150/77 -- -- 67 18 97 %  12/17/20 0533 (!) 142/91 98.3 F (36.8 C) Oral 67 18 98 %   12/17/20 0147 113/72 98.2 F (36.8 C) Oral 66 18 100 %  12/16/20 2126 132/80 97.8 F (36.6 C) Oral 73 16 98 %     Recent laboratory studies: No results for input(s): WBC, HGB, HCT, PLT, NA, K, CL, CO2, BUN, CREATININE, GLUCOSE, INR, CALCIUM in the last 72 hours.  Invalid input(s): PT, 2   Discharge Medications:   Allergies as of 12/17/2020   No Known Allergies      Medication List     TAKE these medications    benzonatate 100 MG capsule Commonly known as: TESSALON Take 1 capsule (100 mg total) by mouth 3 (three) times daily as needed for cough.   gabapentin 800 MG tablet Commonly known as: NEURONTIN Take 1 tablet (800 mg total) by mouth at bedtime.   Glycopyrronium Tosylate 2.4 % Pads Apply 1 each topically daily as needed (sweating).   oxyCODONE-acetaminophen 10-325 MG tablet Commonly known as: PERCOCET Take 1 tablet by mouth every 6 (six) hours as needed for pain.   tiZANidine 4 MG tablet Commonly known as: Zanaflex Take 1 tablet (4 mg total) by mouth every 8 (eight) hours as needed for muscle spasms. What changed:  medication strength how much to take when to take this       ASK your doctor about these medications    atorvastatin 20 MG tablet Commonly known as: LIPITOR TAKE 1 TABLET(20 MG) BY MOUTH DAILY   losartan 50  MG tablet Commonly known as: COZAAR TAKE 1 TABLET BY MOUTH EVERY DAY FOR BLOOD PRESSURE   polyethylene glycol 17 g packet Commonly known as: MiraLax Take 17 g by mouth daily.               Durable Medical Equipment  (From admission, onward)           Start     Ordered   12/16/20 1337  DME 3 n 1  Once        12/16/20 1336   12/16/20 1337  DME Walker rolling  Once       Question Answer Comment  Walker: With 5 Inch Wheels   Patient needs a walker to treat with the following condition Status post revision of total replacement of right knee      12/16/20 1336            Diagnostic Studies: No results  found.  Disposition: Discharge disposition: 01-Home or Self Care          Follow-up Information     [name], MD Follow up in 2 week(s).   Specialty: Orthopedic Surgery Contact information: [place] [address] [place], [place] Follow up.   Specialty: [place] Why: agency will provide home health therapy Contact information: [place] [place] [address] [phone]                  Signed: [name] 12/17/2020, 5:59 PM

## 2020-12-17 NOTE — Progress Notes (Signed)
Subjective: 1 Day Post-Op Procedure(s) (LRB): POLY EXCHANGE RIGHT KNEE (Right) Patient reports pain as moderate.  Working with PT.  Objective: Vital signs in last 24 hours: Temp:  [97.8 F (36.6 C)-98.4 F (36.9 C)] 98.3 F (36.8 C) (06/25 0533) Pulse Rate:  [66-97] 67 (06/25 1002) Resp:  [11-21] 18 (06/25 1002) BP: (113-150)/(70-94) 150/77 (06/25 1002) SpO2:  [97 %-100 %] 97 % (06/25 1002)  Intake/Output from previous day: 06/24 0701 - 06/25 0700 In: 2945.9 [P.O.:540; I.V.:1905.9; IV Piggyback:200] Out: 825 [Urine:775; Blood:50] Intake/Output this shift: Total I/O In: -  Out: 700 [Urine:700]  No results for input(s): HGB in the last 72 hours. No results for input(s): WBC, RBC, HCT, PLT in the last 72 hours. No results for input(s): NA, K, CL, CO2, BUN, CREATININE, GLUCOSE, CALCIUM in the last 72 hours. No results for input(s): LABPT, INR in the last 72 hours.  Sensation intact distally Intact pulses distally Dorsiflexion/Plantar flexion intact Incision: dressing C/D/I No cellulitis present Compartment soft   Assessment/Plan: 1 Day Post-Op Procedure(s) (LRB): POLY EXCHANGE RIGHT KNEE (Right) Up with therapy Discharge home with home health      Monica Watson 12/17/2020, 11:05 AM

## 2020-12-17 NOTE — Discharge Instructions (Signed)

## 2020-12-17 NOTE — TOC Progression Note (Signed)
Transition of Care [facility]) - Progression Note    Patient Details  Name: Monica Watson MRN: [id number] Date of Birth: Nov 06, 1955  [facility] of [facility]) CM/SW Contact  Joaquin Courts, RN Phone Number: 12/17/2020, 11:32 AM  Clinical Narrative:    Patient plans to dc home with HHPT, [facility] to provide services, referral given to re [place].  [employer] to deliver rolling walker and 3in1 to bedside.   Expected Discharge Plan: [place] Barriers to Discharge: No Barriers Identified  Expected Discharge Plan and Services Expected Discharge Plan: [place] Acute Care Choice: Home Health   Expected Discharge Date: 12/17/20               DME Arranged: Gilford Rile rolling, 3-N-1 DME Agency: Other - Comment [facility] Aver) Date DME Agency Contacted: 12/17/20 Time DME Agency Contacted: [phone] Representative spoke with at DME Agency: Brenton Grills HH Arranged: PT [place]: [place] Date [place]: 12/17/20 Time [place]: 1129 Representative spoke with at [place]: [place]   Social Determinants of Health ([place]) Interventions    Readmission Risk Interventions No flowsheet data found.

## 2020-12-18 ENCOUNTER — Encounter (HOSPITAL_COMMUNITY): Payer: Self-pay | Admitting: Orthopaedic Surgery

## 2020-12-29 ENCOUNTER — Other Ambulatory Visit: Payer: Self-pay

## 2020-12-29 ENCOUNTER — Encounter: Payer: Self-pay | Admitting: Orthopaedic Surgery

## 2020-12-29 ENCOUNTER — Ambulatory Visit (INDEPENDENT_AMBULATORY_CARE_PROVIDER_SITE_OTHER): Payer: Medicaid Other | Admitting: Orthopaedic Surgery

## 2020-12-29 DIAGNOSIS — T84062D Wear of articular bearing surface of internal prosthetic right knee joint, subsequent encounter: Secondary | ICD-10-CM

## 2020-12-29 DIAGNOSIS — Z96651 Presence of right artificial knee joint: Secondary | ICD-10-CM

## 2020-12-29 MED ORDER — OXYCODONE-ACETAMINOPHEN 10-325 MG PO TABS: 1.0000 | ORAL_TABLET | Freq: Four times a day (QID) | ORAL | 0 refills | Status: DC | PRN

## 2020-12-29 NOTE — Progress Notes (Signed)
The patient comes in today at 2 weeks status post a polyliner exchange of a right total knee arthroplasty.  This was a press-fit knee that was done elsewhere and the metal components were great and intact.  She had surprisingly gotten excellent range of motion of her right knee but it was definitely ligamentously unstable and needed upsizing of the poly-.  She is doing much better overall and says the knee does feel stable.  She is in chronic pain management.  We will continue her Percocet until they take over pain control.  Home therapy has been irrigated flexing to about 83 degrees.  She needs to transition outpatient therapy unless they can keep coming to her home for a while since she is not able to drive and has no help.  The incision looks good and staples been removed and Steri-Strips applied.  Her calf is soft.  The knee feels stable on exam.  I will see her back in 4 weeks to see how she is doing overall from mobility standpoint but no x-rays are needed.

## 2021-01-05 ENCOUNTER — Telehealth: Payer: Self-pay | Admitting: Family Medicine

## 2021-01-05 NOTE — Telephone Encounter (Signed)
   Natalye Kott DOB: 04/06/1956 MRN: [id number]   RIDER WAIVER AND RELEASE OF LIABILITY  For purposes of improving physical access to our facilities, [facility] is pleased to partner with third parties to provide [facility] patients or other authorized individuals the option of convenient, on-demand ground transportation services (the [place]") through use of the technology service that enables users to request on-demand ground transportation from independent third-party providers.  By opting to use and accept these [employer], I, the undersigned, hereby agree on behalf of myself, and on behalf of any minor child using the Government social research officer for whom I am the parent or legal guardian, as follows:  Government social research officer provided to me are provided by independent third-party transportation providers who are not [employer] or employees and who are unaffiliated with [employer]. [facility] is neither a transportation carrier nor a common or public carrier. [facility] has no control over the quality or safety of the transportation that occurs as a result of the [employer]. [facility] cannot guarantee that any third-party transportation provider will complete any arranged transportation service. [facility] makes no representation, warranty, or guarantee regarding the reliability, timeliness, quality, safety, suitability, or availability of any of the Transport Services or that they will be error free. I fully understand that traveling by vehicle involves risks and dangers of serious bodily injury, including permanent disability, paralysis, and death. I agree, on behalf of myself and on behalf of any minor child using the Transport Services for whom I am the parent or legal guardian, that the entire risk arising out of my use of the [employer] remains solely with me, to the maximum extent permitted under applicable law. The [employer] are provided "as  is" and "as available." [facility] disclaims all representations and warranties, express, implied or statutory, not expressly set out in these terms, including the implied warranties of merchantability and fitness for a particular purpose. I hereby waive and release [place], its agents, employees, officers, directors, representatives, insurers, attorneys, assigns, successors, subsidiaries, and affiliates from any and all past, present, or future claims, demands, liabilities, actions, causes of action, or suits of any kind directly or indirectly arising from acceptance and use of the [employer]. I further waive and release [facility] and its affiliates from all present and future liability and responsibility for any injury or death to persons or damages to property caused by or related to the use of the [employer]. I have read this Waiver and Release of Liability, and I understand the terms used in it and their legal significance. This Waiver is freely and voluntarily given with the understanding that my right (as well as the right of any minor child for whom I am the parent or legal guardian using the [employer]) to legal recourse against [facility] in connection with the [employer] is knowingly surrendered in return for use of these services.   I attest that I read the consent document to Annie Sable, gave Ms. Darcey the opportunity to ask questions and answered the questions asked (if any). I affirm that Loralai Eisman then provided consent for she's participation in this program.     Darrick Meigs [place]

## 2021-01-09 ENCOUNTER — Other Ambulatory Visit: Payer: Self-pay

## 2021-01-09 ENCOUNTER — Ambulatory Visit: Payer: Medicaid Other | Attending: Orthopaedic Surgery

## 2021-01-09 DIAGNOSIS — M25661 Stiffness of right knee, not elsewhere classified: Secondary | ICD-10-CM | POA: Insufficient documentation

## 2021-01-09 DIAGNOSIS — M6281 Muscle weakness (generalized): Secondary | ICD-10-CM | POA: Insufficient documentation

## 2021-01-09 DIAGNOSIS — R2681 Unsteadiness on feet: Secondary | ICD-10-CM | POA: Diagnosis present

## 2021-01-09 DIAGNOSIS — G8929 Other chronic pain: Secondary | ICD-10-CM | POA: Diagnosis present

## 2021-01-09 DIAGNOSIS — M25561 Pain in right knee: Secondary | ICD-10-CM | POA: Insufficient documentation

## 2021-01-09 NOTE — Therapy (Signed)
[place], [place], [address] Phone: [phone]   Fax:  [phone]  Physical Therapy Evaluation  Patient Details  Name: Monica Watson MRN: [id number] Date of Birth: April 05, 1956 Referring Provider (PT): Mcarthur Watson   Encounter Date: 01/09/2021   PT End of Session - 01/09/21 2129     Visit Number 1    Number of Visits 17    Date for PT Re-Evaluation 03/06/21    Authorization Type [place] MCD- pending auth    PT Start Time 1615    PT Stop Time 1700    PT Time Calculation (min) 45 min    Activity Tolerance Patient tolerated treatment well    Behavior During Therapy [facility] for tasks assessed/performed             Past Medical History:  Diagnosis Date   Arthritis    Cerebrovascular disease    Depression    Hypertension    Stroke ([place])    Type 2 diabetes mellitus ([place])     Past Surgical History:  Procedure Laterality Date   bilateral knee replacements      bilateral shoulder replacement     I & D KNEE WITH POLY EXCHANGE Right 12/16/2020   Procedure: POLY EXCHANGE RIGHT KNEE;  Surgeon: [name], MD;  Location: [facility];  Service: Orthopedics;  Laterality: Right;   left carpal tunnel release      THYROIDECTOMY      There were no vitals filed for this visit.    Subjective Assessment - 01/09/21 1626     Subjective Pt reports to clinic with primary c/o R knee pain and stiffness following a R TKA revision involving a polyethyline exchange on 12/16/2020. She reports having to navigate 12 stairs in her home daily to get to her bedroom/ bathroom, which led to increased pain about 1 year ago. This led to her 2nd R TKA revision since 2017. She currently has pain with bending, sleeping, squatting, and standing. She also reports regular morning stiffness lasting about 30 minutes. She also reports some unsteadiness when standing. She presents with a wide-base quad cane at this time. Pt  denies any suicidal or homicidal ideation at this time.    Limitations Standing;Other (comment)   bending, sleeping   How long can you sit comfortably? Unlimited    How long can you stand comfortably? 15 minutes    How long can you walk comfortably? Unlimited    Currently in Pain? Yes    Pain Score 7     Pain Location Knee    Pain Orientation Right    Pain Descriptors / Indicators Aching    Pain Type Surgical pain;Chronic pain    Pain Onset More than a month ago    Pain Frequency Constant    Aggravating Factors  Bending, squatting, prolonged standing, sleeping    Pain Relieving Factors Pain medication, ice    Effect of Pain on Daily Activities Unable to perform household chores or sleep without difficulty                [facility] PT Assessment - 01/09/21 0001       Assessment   Medical Diagnosis Polyethylene wear of right knee prosthesis, 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  No    Is the patient reluctant to leave their home because of a fear of falling?  No      Home Social worker Private residence    Living Arrangements Alone    Available Help at Discharge Family;Friend(s)    Type of [place] Access Level entry    [place] One level      Prior Function   Level of Independence Independent      Observation/Other Assessments   Observations Minor quad lag on R with SLR;Surgical scar healing well      AROM   Right Knee Extension -5    Right Knee Flexion 94    Left Knee Extension -5    Left Knee Flexion 122       PROM   Left Knee Extension -5    Left Knee Flexion 122      Strength   Right Hip Flexion 4/5    Right Hip Extension 4+/5    Right Hip ABduction 4/5    Left Hip Flexion 4/5    Left Hip Extension 4+/5    Left Hip ABduction 4+/5    Right Knee Flexion 4+/5    Right Knee Extension 4/5   with pain   Left Knee Flexion 5/5    Left Knee Extension 5/5    Right Ankle Dorsiflexion 5/5    Right Ankle Plantar Flexion 5/5    Left Ankle Dorsiflexion 5/5    Left Ankle Plantar Flexion 5/5      Palpation   Patella mobility WNL BIL      Ambulation/Gait   Ambulation/Gait Yes    Assistive device Large base quad cane    Gait Pattern Antalgic;Trunk flexed                        Objective measurements completed on examination: See above findings.               PT Education - 01/09/21 2127     Education Details pt educated on importance of early, aggressive knee flexion ROM measures. Also educated on proper form when performing exercises.    Person(s) Educated Patient    Methods Explanation;Handout;Demonstration    Comprehension Verbalized understanding;Returned demonstration              PT Short Term Goals - 01/09/21 2138       PT SHORT TERM GOAL #1   Title Pt will report understanding and adherence to her HEP in order to promote independence in the maintenance of her primary impairments.    Baseline HEP given at eval    Time 4    Period Weeks    Status New    Target Date 02/06/21      PT SHORT TERM GOAL #2   Title -    Baseline -      PT SHORT TERM GOAL #3   Title -    Baseline -      PT SHORT TERM GOAL #4   Title -    Baseline -  PT Long Term Goals - 01/09/21 2139       PT LONG TERM GOAL #1   Title Pt will demonstrate R knee flexion AROM of 125 degrees in order to promote normal gait.    Baseline 94 degrees    Time 8    Period Weeks    Status New    Target Date 03/06/21      PT LONG TERM GOAL #2   Title Pt will  report ability to sleep through the night without being woekn due to knee pain.    Baseline Pt is regularly woken by knee pain, requiring repositioning.    Time 8    Period Weeks    Status New    Target Date 03/06/21      PT LONG TERM GOAL #3   Title Pt will demonstrate BIL global hip MMT of 5/5 in order to progress LE strengthening exercises without limitation.    Baseline BIL global hip strength ranging from 4/5 to 4+/5    Time 8    Period Weeks    Status New    Target Date 03/06/21      PT LONG TERM GOAL #4   Title Pt will report ability to stand >30 minutes without an AD and with pain no greater than 3/10 in order to wash dishes without limitation.    Baseline Unable to stand >15 minutes without >6/10 pain    Time 8    Period Weeks    Status New    Target Date 03/06/21      PT LONG TERM GOAL #5   Title -    Baseline -                    Plan - 01/09/21 2130     Clinical Impression Statement Pt is a pleasant 65yo F who presents 3 weeks 3 days s/p R knee TKA revision with primary c/o R knee pain and stiffness. Upon assessment, her primary impairments include limited R knee flexion AROM, weak BIL global hip musculature, weak R knee flexion MMT, weak and painful R knee extension MMT, and antalgic gait. She will benefit from skilled PT to address her primary impairments and return to her prior level of function without limitation.    Personal Factors and Comorbidities Comorbidity 3+    Comorbidities See medical hx    Examination-Activity Limitations Bathing;Stand;Bend;Sleep;Squat;Stairs    Examination-Participation Restrictions Yard Work;Cleaning;Community Activity;Laundry    Stability/Clinical Decision Making Stable/Uncomplicated    Clinical Decision Making Low    Rehab Potential Good    PT Frequency 2x / week    PT Duration 8 weeks    PT Treatment/Interventions ADLs/Self Care Home Management;Aquatic Therapy;Biofeedback;Cryotherapy;Moist Heat;Electrical  Stimulation;DME Instruction;Gait training;Stair training;Functional mobility training;Therapeutic activities;Patient/family education;Manual techniques;Manual lymph drainage;Neuromuscular re-education;Balance training;Therapeutic exercise;Compression bandaging;Taping;Joint Manipulations;Scar mobilization;Passive range of motion;Dry needling    PT Next Visit Plan Assess balance, squat, progress LE strengthening/ knee flexion ROM according to TKA protocol    PT Home Exercise Plan [id number]    Consulted and Agree with Plan of Care Patient             Patient will benefit from skilled therapeutic intervention in order to improve the following deficits and impairments:  Abnormal gait, Pain, Decreased scar mobility, Decreased mobility, Decreased endurance, Decreased range of motion, Decreased strength, Hypomobility, Impaired flexibility, Increased edema, Difficulty walking, Decreased balance  Visit Diagnosis: Chronic pain of right knee  Stiffness of right knee, not elsewhere classified  Unsteadiness on  feet  Muscle weakness (generalized)     Problem List Patient Active Problem List   Diagnosis Date Noted   Status post revision of total replacement of right knee 12/16/2020   Polyethylene wear of right knee prosthesis, 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/15/2020   Sialadenitis 11/23/2020   Abdominal pain 10/23/2020   Cervical cancer screening 08/10/2020   [place] cyst 07/29/2020   History of total knee replacement, left 06/13/2020   History of total knee arthroplasty, right 06/13/2020   Acute right-sided back pain 06/09/2020   Depression, major, single episode, mild ([place]) 05/12/2020   Vision changes 12/31/2019   Bilateral knee pain 10/08/2019   Poor dentition 10/08/2019   Chronic left shoulder pain 08/31/2019   Hemorrhoids 04/04/2019   History of stroke 01/01/2019   Hypertension associated with diabetes ([place]) 01/01/2019   Pre-diabetes 01/01/2019   Hot flashes 01/01/2019   Onychomycosis  01/01/2019    Vanessa Blennerhassett, PT, DPT 01/09/21 9:49 PM   [facility] [address] [place], [place], [address] Phone: [phone]   Fax:  [phone]  Name: Monica Watson MRN: [id number] Date of Birth: 1955/10/03

## 2021-01-09 NOTE — Patient Instructions (Signed)
  [id number]

## 2021-01-17 ENCOUNTER — Ambulatory Visit: Payer: Medicaid Other

## 2021-01-18 ENCOUNTER — Other Ambulatory Visit: Payer: Self-pay | Admitting: Family Medicine

## 2021-01-19 ENCOUNTER — Ambulatory Visit: Payer: Medicaid Other

## 2021-01-20 ENCOUNTER — Other Ambulatory Visit: Payer: Self-pay | Admitting: Family Medicine

## 2021-01-26 ENCOUNTER — Ambulatory Visit: Payer: Medicaid Other | Attending: Orthopaedic Surgery

## 2021-01-26 ENCOUNTER — Other Ambulatory Visit: Payer: Self-pay

## 2021-01-26 DIAGNOSIS — G8929 Other chronic pain: Secondary | ICD-10-CM | POA: Insufficient documentation

## 2021-01-26 DIAGNOSIS — M25661 Stiffness of right knee, not elsewhere classified: Secondary | ICD-10-CM | POA: Insufficient documentation

## 2021-01-26 DIAGNOSIS — M6281 Muscle weakness (generalized): Secondary | ICD-10-CM | POA: Insufficient documentation

## 2021-01-26 DIAGNOSIS — M25561 Pain in right knee: Secondary | ICD-10-CM | POA: Insufficient documentation

## 2021-01-26 DIAGNOSIS — R2681 Unsteadiness on feet: Secondary | ICD-10-CM | POA: Insufficient documentation

## 2021-01-26 NOTE — Patient Instructions (Signed)
  [id number]

## 2021-01-26 NOTE — Therapy (Signed)
[place], [place], [address] Phone: [phone]   Fax:  [phone]  Physical Therapy Treatment  Patient Details  Name: Monica Watson MRN: [id number] Date of Birth: 1955-11-07 Referring Provider (PT): [name]   Encounter Date: 01/26/2021   PT End of Session - 01/26/21 1642     Visit Number 2    Number of Visits 17    Date for PT Re-Evaluation 03/06/21    Authorization Type Due to being authorized with another PT, pt is unauthorized. Will submit authorization on 01/30/2021    PT Start Time 1615    PT Stop Time 1700    PT Time Calculation (min) 45 min    Activity Tolerance Patient tolerated treatment well    Behavior During Therapy WFL for tasks assessed/performed             Past Medical History:  Diagnosis Date   Arthritis    Cerebrovascular disease    Depression    Hypertension    Stroke ([facility])    Type 2 diabetes mellitus ([place])     Past Surgical History:  Procedure Laterality Date   bilateral knee replacements      bilateral shoulder replacement     I & D KNEE WITH POLY EXCHANGE Right 12/16/2020   Procedure: POLY EXCHANGE RIGHT KNEE;  Surgeon: [name], MD;  Location: [facility];  Service: Orthopedics;  Laterality: Right;   left carpal tunnel release      THYROIDECTOMY      There were no vitals filed for this visit.   Subjective Assessment - 01/26/21 1613     Subjective Pt reports feeling well today with reports of feeling much better since her eval. Her primary complaint is when laying on her R side, she experiences lateral shank pain. She reports daily adherence to her HEP since her eval.    Pain Score 7     Pain Location Knee    Pain Orientation Right    Pain Descriptors / Indicators Aching                [facility] PT Assessment - 01/26/21 0001       AROM   Right Knee Flexion 97                           [facility] Adult PT  Treatment/Exercise - 01/26/21 0001       Knee/Hip Exercises: Stretches   Other Knee/Hip Stretches AAROM R knee flexion with green strap x10 with 5sec hold      Knee/Hip Exercises: Standing   Wall Squat Other (comment)   quarter squat at wall 3x10     Knee/Hip Exercises: Seated   Long Arc Quad Strengthening   2x10 with YTB   Knee/Hip Flexion knee flexion with YTB 2x10      Knee/Hip Exercises: Supine   Bridges Strengthening   2x10                   PT Education - 01/26/21 1642     Education Details Pt educated on importance of continued knee flexion AAROM, along with proper form when performing new exercises.    Person(s) Educated Patient    Methods Explanation;Demonstration;Handout    Comprehension Verbalized understanding;Returned demonstration              PT Short Term Goals - 01/09/21 2138       PT SHORT TERM  GOAL #1   Title Pt will report understanding and adherence to her HEP in order to promote independence in the maintenance of her primary impairments.    Baseline HEP given at eval    Time 4    Period Weeks    Status New    Target Date 02/06/21      PT SHORT TERM GOAL #2   Title -    Baseline -      PT SHORT TERM GOAL #3   Title -    Baseline -      PT SHORT TERM GOAL #4   Title -    Baseline -               PT Long Term Goals - 01/09/21 2139       PT LONG TERM GOAL #1   Title Pt will demonstrate R knee flexion AROM of 125 degrees in order to promote normal gait.    Baseline 94 degrees    Time 8    Period Weeks    Status New    Target Date 03/06/21      PT LONG TERM GOAL #2   Title Pt will report ability to sleep through the night without being woekn due to knee pain.    Baseline Pt is regularly woken by knee pain, requiring repositioning.    Time 8    Period Weeks    Status New    Target Date 03/06/21      PT LONG TERM GOAL #3   Title Pt will demonstrate BIL global hip MMT of 5/5 in order to progress LE strengthening  exercises without limitation.    Baseline BIL global hip strength ranging from 4/5 to 4+/5    Time 8    Period Weeks    Status New    Target Date 03/06/21      PT LONG TERM GOAL #4   Title Pt will report ability to stand >30 minutes without an AD and with pain no greater than 3/10 in order to wash dishes without limitation.    Baseline Unable to stand >15 minutes without >6/10 pain    Time 8    Period Weeks    Status New    Target Date 03/06/21      PT LONG TERM GOAL #5   Title -    Baseline -                   Plan - 01/26/21 1654     Clinical Impression Statement Pt responded well to all treatment today, demonstrating proper form and no increase in pain with all exercises performed. She leaves with 7/10 pain and reports that she can tell the exercises are helping with her leg strength. She will continue to benefit from skilled PT to address her primary impairments and return to her prior level of function without limitation. It was discovered during the treatment that the pt is currently authorized with another PT. That authorization period ends on 01/30/2021. Due to this conundrum, today's visit will not be charged due to her POC not being authorized currently at [employer]. Will re-submit for authorization after her current authorization period is complete. Also, the pt will not be scheduled again at [facility] until her current authorization period is complete.    Personal Factors and Comorbidities Comorbidity 3+    Comorbidities See medical hx    Examination-Activity Limitations Bathing;Stand;Bend;Sleep;Squat;Stairs    Examination-Participation Restrictions Yard Work;Cleaning;Community  Activity;Laundry    Stability/Clinical Decision Making Stable/Uncomplicated    Clinical Decision Making Low    Rehab Potential Good    PT Frequency 2x / week    PT Duration 8 weeks    PT Treatment/Interventions ADLs/Self Care Home Management;Aquatic  Therapy;Biofeedback;Cryotherapy;Moist Heat;Electrical Stimulation;DME Instruction;Gait training;Stair training;Functional mobility training;Therapeutic activities;Patient/family education;Manual techniques;Manual lymph drainage;Neuromuscular re-education;Balance training;Therapeutic exercise;Compression bandaging;Taping;Joint Manipulations;Scar mobilization;Passive range of motion;Dry needling    PT Next Visit Plan Assess balance, squat, progress LE strengthening/ knee flexion ROM according to TKA protocol    PT Home Exercise Plan [id number]    Consulted and Agree with Plan of Care Patient             Patient will benefit from skilled therapeutic intervention in order to improve the following deficits and impairments:  Abnormal gait, Pain, Decreased scar mobility, Decreased mobility, Decreased endurance, Decreased range of motion, Decreased strength, Hypomobility, Impaired flexibility, Increased edema, Difficulty walking, Decreased balance  Visit Diagnosis: Chronic pain of right knee  Stiffness of right knee, not elsewhere classified  Unsteadiness on feet  Muscle weakness (generalized)     Problem List Patient Active Problem List   Diagnosis Date Noted   Status post revision of total replacement of right knee 12/16/2020   Polyethylene wear of right knee prosthesis, 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/15/2020   Sialadenitis 11/23/2020   Abdominal pain 10/23/2020   Cervical cancer screening 08/10/2020   Bartholin cyst 07/29/2020   History of total knee replacement, left 06/13/2020   History of total knee arthroplasty, right 06/13/2020   Acute right-sided back pain 06/09/2020   Depression, major, single episode, mild ([place]) 05/12/2020   Vision changes 12/31/2019   Bilateral knee pain 10/08/2019   Poor dentition 10/08/2019   Chronic left shoulder pain 08/31/2019   Hemorrhoids 04/04/2019   History of stroke 01/01/2019   Hypertension associated with diabetes ([place]) 01/01/2019   Pre-diabetes  01/01/2019   Hot flashes 01/01/2019   Onychomycosis 01/01/2019    Vanessa [place], PT, DPT 01/26/21 6:53 PM   [facility] [address] [place], [place], [address] Phone: [phone]   Fax:  [phone]  Name: Lashaunta Weinhardt MRN: [id number] Date of Birth: 11-03-1955

## 2021-01-30 ENCOUNTER — Encounter: Payer: Self-pay | Admitting: Orthopaedic Surgery

## 2021-01-30 ENCOUNTER — Ambulatory Visit (INDEPENDENT_AMBULATORY_CARE_PROVIDER_SITE_OTHER): Payer: Medicaid Other | Admitting: Orthopaedic Surgery

## 2021-01-30 DIAGNOSIS — T84062D Wear of articular bearing surface of internal prosthetic right knee joint, subsequent encounter: Secondary | ICD-10-CM

## 2021-01-30 DIAGNOSIS — Z96651 Presence of right artificial knee joint: Secondary | ICD-10-CM

## 2021-01-30 NOTE — Progress Notes (Signed)
The patient is following up in about 6 weeks status post a right knee polyliner revision.  She had a knee replacement done elsewhere and there was definitely ligamentous laxity in her knee.  We did upsize her polyliner from a 13 to a 16.  She reports improvement in her stability with that knee.  She is going to physical therapy as well.  She is on Neurontin and oxycodone through chronic pain management.  Her extension is full with her right operative knee and her flexion is past 90 degrees.  Her calf is soft.  There is some swelling to be expected with her right knee but overall she looks good.  She will continue aggressive physical therapy for her right knee.  She will then transition to a home exercise program.  Her pain management can take back over her pain medication and Neurontin.  We will see her back in 3 months to see how she is doing overall.  We will have an AP and lateral of her right knee at that visit.

## 2021-02-02 ENCOUNTER — Other Ambulatory Visit: Payer: Self-pay

## 2021-02-02 ENCOUNTER — Ambulatory Visit: Payer: Medicaid Other

## 2021-02-02 DIAGNOSIS — R2681 Unsteadiness on feet: Secondary | ICD-10-CM | POA: Diagnosis present

## 2021-02-02 DIAGNOSIS — M6281 Muscle weakness (generalized): Secondary | ICD-10-CM

## 2021-02-02 DIAGNOSIS — G8929 Other chronic pain: Secondary | ICD-10-CM | POA: Diagnosis present

## 2021-02-02 DIAGNOSIS — M25561 Pain in right knee: Secondary | ICD-10-CM | POA: Diagnosis present

## 2021-02-02 DIAGNOSIS — M25661 Stiffness of right knee, not elsewhere classified: Secondary | ICD-10-CM | POA: Diagnosis present

## 2021-02-02 NOTE — Therapy (Signed)
[place], [place], [address] Phone: [phone]   Fax:  [phone]  Physical Therapy Treatment  Patient Details  Name: Monica Watson MRN: [id number] Date of Birth: 1956-06-16 Referring Provider (PT): [name]   Encounter Date: 02/02/2021   PT End of Session - 02/02/21 1613     Visit Number 3    Number of Visits 17    Date for PT Re-Evaluation 03/06/21    Authorization Type [facility]    Authorization Time Period 02/02/2021 - unknown    Authorization - Visit Number 1    Authorization - Number of Visits 3    PT Start Time 4010    PT Stop Time 1630    PT Time Calculation (min) 45 min    Activity Tolerance Patient tolerated treatment well    Behavior During Therapy [facility] for tasks assessed/performed             Past Medical History:  Diagnosis Date   Arthritis    Cerebrovascular disease    Depression    Hypertension    Stroke ([place])    Type 2 diabetes mellitus ([place])     Past Surgical History:  Procedure Laterality Date   bilateral knee replacements      bilateral shoulder replacement     I & D KNEE WITH POLY EXCHANGE Right 12/16/2020   Procedure: POLY EXCHANGE RIGHT KNEE;  Surgeon: [name], MD;  Location: [facility];  Service: Orthopedics;  Laterality: Right;   left carpal tunnel release      THYROIDECTOMY      There were no vitals filed for this visit.   Subjective Assessment - 02/02/21 1539     Subjective Pt reports 8/10 pain in her R knee down to her R foot today. She reports seeing her doctor earlier this week and that he told her the foot pain is likely from vascular pain following her surgery. She received an overall positive report from her doctor who told her to "keep doing what she's doing." She reports doing knee flexion AAROM and seated marches, but denies performing any other HEP exercises.    Currently in Pain? Yes    Pain Score 8     Pain Location  Knee    Pain Orientation Right    Pain Descriptors / Indicators Aching    Pain Type Chronic pain;Surgical pain    Pain Radiating Towards Down to R foot    Pain Onset More than a month ago    Pain Frequency Constant                [facility] PT Assessment - 02/02/21 0001       Observation/Other Assessments   Observations Well's criteria score: -2      Observation/Other Assessments-Edema    Edema Circumferential   Mid-calf: L 39cm, R 39.5cm     Circumferential Edema   Circumferential - Right 39.5cm    Circumferential - Left  39cm      AROM   Right Knee Flexion 97                           [facility] Adult PT Treatment/Exercise - 02/02/21 0001       Knee/Hip Exercises: Standing   Other Standing Knee Exercises Heel taps 2x10 BIL in // bars      Knee/Hip Exercises: Seated   Long Probation officer;Other (comment)   2x10 eccentric  knee extension 2 up, 1 down 5#     Knee/Hip Exercises: Supine   Writer;Other (comment)   Bridge with marching 2x10 BIL     Knee/Hip Exercises: Sidelying   Hip ABduction Strengthening   2x10 BIL     Manual Therapy   Manual Therapy Muscle Energy Technique    Muscle Energy Technique R knee flexion contract/ relax MET 3x4                    PT Education - 02/02/21 1613     Education Details Pt educated on importance of continued knee flexion AAROM, along with proper form when performing new exercises.    Person(s) Educated Patient    Methods Explanation;Demonstration;Handout    Comprehension Verbalized understanding;Returned demonstration              PT Short Term Goals - 01/09/21 2138       PT SHORT TERM GOAL #1   Title Pt will report understanding and adherence to her HEP in order to promote independence in the maintenance of her primary impairments.    Baseline HEP given at eval    Time 4    Period Weeks    Status New    Target Date 02/06/21      PT SHORT TERM GOAL #2   Title -     Baseline -      PT SHORT TERM GOAL #3   Title -    Baseline -      PT SHORT TERM GOAL #4   Title -    Baseline -               PT Long Term Goals - 01/09/21 2139       PT LONG TERM GOAL #1   Title Pt will demonstrate R knee flexion AROM of 125 degrees in order to promote normal gait.    Baseline 94 degrees    Time 8    Period Weeks    Status New    Target Date 03/06/21      PT LONG TERM GOAL #2   Title Pt will report ability to sleep through the night without being woekn due to knee pain.    Baseline Pt is regularly woken by knee pain, requiring repositioning.    Time 8    Period Weeks    Status New    Target Date 03/06/21      PT LONG TERM GOAL #3   Title Pt will demonstrate BIL global hip MMT of 5/5 in order to progress LE strengthening exercises without limitation.    Baseline BIL global hip strength ranging from 4/5 to 4+/5    Time 8    Period Weeks    Status New    Target Date 03/06/21      PT LONG TERM GOAL #4   Title Pt will report ability to stand >30 minutes without an AD and with pain no greater than 3/10 in order to wash dishes without limitation.    Baseline Unable to stand >15 minutes without >6/10 pain    Time 8    Period Weeks    Status New    Target Date 03/06/21      PT LONG TERM GOAL #5   Title -    Baseline -                   Plan - 02/02/21 1615     Clinical Impression Statement  Pt responded well to all treatment today, demonstrating proper form and no increase in pain with all exercises performed. She leaves with 7/10 pain and reports that she can tell the exercises are helping with her leg strength. She is still limited in R knee flexion ROM; this will be the primary focus of rehab moving forward. She will continue to benefit from skilled PT to address her primary impairments and return to her prior level of function without limitation.    Personal Factors and Comorbidities Comorbidity 3+    Comorbidities See medical hx     Examination-Activity Limitations Bathing;Stand;Bend;Sleep;Squat;Stairs    Examination-Participation Restrictions Yard Work;Cleaning;Community Activity;Laundry    Stability/Clinical Decision Making Stable/Uncomplicated    Clinical Decision Making Low    Rehab Potential Good    PT Frequency 2x / week    PT Duration 8 weeks    PT Treatment/Interventions ADLs/Self Care Home Management;Aquatic Therapy;Biofeedback;Cryotherapy;Moist Heat;Electrical Stimulation;DME Instruction;Gait training;Stair training;Functional mobility training;Therapeutic activities;Patient/family education;Manual techniques;Manual lymph drainage;Neuromuscular re-education;Balance training;Therapeutic exercise;Compression bandaging;Taping;Joint Manipulations;Scar mobilization;Passive range of motion;Dry needling    PT Next Visit Plan Assess balance, squat, progress LE strengthening/ knee flexion ROM according to TKA protocol    PT Home Exercise Plan [id number]    Consulted and Agree with Plan of Care Patient             Patient will benefit from skilled therapeutic intervention in order to improve the following deficits and impairments:  Abnormal gait, Pain, Decreased scar mobility, Decreased mobility, Decreased endurance, Decreased range of motion, Decreased strength, Hypomobility, Impaired flexibility, Increased edema, Difficulty walking, Decreased balance  Visit Diagnosis: Chronic pain of right knee  Stiffness of right knee, not elsewhere classified  Unsteadiness on feet  Muscle weakness (generalized)     Problem List Patient Active Problem List   Diagnosis Date Noted   Status post revision of total replacement of right knee 12/16/2020   Polyethylene wear of right knee prosthesis, 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/15/2020   Sialadenitis 11/23/2020   Abdominal pain 10/23/2020   Cervical cancer screening 08/10/2020   Bartholin cyst 07/29/2020   History of total knee replacement, left 06/13/2020   History of total knee  arthroplasty, right 06/13/2020   Acute right-sided back pain 06/09/2020   Depression, major, single episode, mild ([place]) 05/12/2020   Vision changes 12/31/2019   Bilateral knee pain 10/08/2019   Poor dentition 10/08/2019   Chronic left shoulder pain 08/31/2019   Hemorrhoids 04/04/2019   History of stroke 01/01/2019   Hypertension associated with diabetes ([place]) 01/01/2019   Pre-diabetes 01/01/2019   Hot flashes 01/01/2019   Onychomycosis 01/01/2019    Vanessa [place], PT, DPT 02/02/21 4:29 PM   [place] [facility] [address] [place], [place], [address] Phone: [phone]   Fax:  [phone]  Name: Orlandria Kissner MRN: [id number] Date of Birth: 1955/11/11

## 2021-02-02 NOTE — Patient Instructions (Signed)
  [id number]

## 2021-02-07 ENCOUNTER — Ambulatory Visit (HOSPITAL_COMMUNITY)
Admission: EM | Admit: 2021-02-07 | Discharge: 2021-02-07 | Disposition: A | Discharge status: Home / Self Care | Payer: Medicaid Other | Attending: Emergency Medicine | Admitting: Emergency Medicine

## 2021-02-07 ENCOUNTER — Other Ambulatory Visit: Payer: Self-pay

## 2021-02-07 ENCOUNTER — Ambulatory Visit: Payer: Medicaid Other

## 2021-02-07 ENCOUNTER — Encounter (HOSPITAL_COMMUNITY): Payer: Self-pay

## 2021-02-07 DIAGNOSIS — L03818 Cellulitis of other sites: Secondary | ICD-10-CM | POA: Diagnosis not present

## 2021-02-07 DIAGNOSIS — L739 Follicular disorder, unspecified: Secondary | ICD-10-CM

## 2021-02-07 MED ORDER — CEPHALEXIN 500 MG PO CAPS: 500.0000 mg | ORAL_CAPSULE | Freq: Four times a day (QID) | ORAL | 0 refills | Status: DC

## 2021-02-07 NOTE — ED Triage Notes (Signed)
Pt c/o discomfort in her right jaw area she states "it's not in my mouth it's outside." She denied it being painful, it's uncomfortable and achy/dull. States this has not happened before and does not recall any trauma injury to the area. Denies trouble eating/swallowing. States she tried a Copywriter, advertising" and tylenol at home without relief.

## 2021-02-07 NOTE — ED Provider Notes (Signed)
[facility]    CSN: [id number] Arrival date & time: 02/07/21  0815      History   Chief Complaint Chief Complaint  Patient presents with   Jaw Pain    HPI Monica Watson is a 65 y.o. female.   Rt jaw pain under chin where pt shaved her hair. States that it is red and she has pain that radiates to rt ear. No chest pain, no sob, no ear pain. Has used warm compress with no relief. No tooth pain    Past Medical History:  Diagnosis Date   Arthritis    Cerebrovascular disease    Depression    Hypertension    Stroke [facility])    Type 2 diabetes mellitus ([place])     Patient Active Problem List   Diagnosis Date Noted   Status post revision of total replacement of right knee 12/16/2020   Polyethylene wear of right knee prosthesis, 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/15/2020   Sialadenitis 11/23/2020   Abdominal pain 10/23/2020   Cervical cancer screening 08/10/2020   Bartholin cyst 07/29/2020   History of total knee replacement, left 06/13/2020   History of total knee arthroplasty, right 06/13/2020   Acute right-sided back pain 06/09/2020   Depression, major, single episode, mild ([place]) 05/12/2020   Vision changes 12/31/2019   Bilateral knee pain 10/08/2019   Poor dentition 10/08/2019   Chronic left shoulder pain 08/31/2019   Hemorrhoids 04/04/2019   History of stroke 01/01/2019   Hypertension associated with diabetes ([place]) 01/01/2019   Pre-diabetes 01/01/2019   Hot flashes 01/01/2019   Onychomycosis 01/01/2019    Past Surgical History:  Procedure Laterality Date   bilateral knee replacements      bilateral shoulder replacement     I & D KNEE WITH POLY EXCHANGE Right 12/16/2020   Procedure: POLY EXCHANGE RIGHT KNEE;  Surgeon: [name], MD;  Location: [facility];  Service: Orthopedics;  Laterality: Right;   left carpal tunnel release      THYROIDECTOMY      OB History   No obstetric history on file.      Home Medications    Prior to Admission  medications   Medication Sig Start Date End Date Taking? Authorizing Provider  cephALEXin (KEFLEX) 500 MG capsule Take 1 capsule (500 mg total) by mouth 4 (four) times daily. 02/07/21  Yes Morley Kos L, NP  atorvastatin (LIPITOR) 20 MG tablet TAKE 1 TABLET(20 MG) BY MOUTH DAILY Patient taking differently: Take 20 mg by mouth daily. TAKE 1 TABLET(20 MG) BY MOUTH DAILY 01/01/20   Sharion Settler, DO  benzonatate (TESSALON) 100 MG capsule Take 1 capsule (100 mg total) by mouth 3 (three) times daily as needed for cough. 12/06/20   Sharion Settler, DO  gabapentin (NEURONTIN) 800 MG tablet Take 1 tablet (800 mg total) by mouth at bedtime. 12/17/20   [name], MD  Glycopyrronium Tosylate 2.4 % PADS Apply 1 each topically daily as needed (sweating). Patient not taking: Reported on 01/09/2021 12/06/20   Sharion Settler, DO  losartan (COZAAR) 50 MG tablet Take 1 tablet (50 mg total) by mouth daily. TAKE 1 TABLET BY MOUTH EVERY DAY FOR BLOOD PRESSURE 01/18/21   Sharion Settler, DO  oxyCODONE-acetaminophen (PERCOCET) 10-325 MG tablet Take 1 tablet by mouth every 6 (six) hours as needed for pain. 12/29/20   [name], MD  polyethylene glycol (MIRALAX) 17 g packet Take 17 g by mouth daily. Patient not taking: Reported on 01/09/2021 10/21/20   Cresenzo,  Christy Sartorius, MD  tiZANidine (ZANAFLEX) 4 MG tablet Take 1 tablet (4 mg total) by mouth every 8 (eight) hours as needed for muscle spasms. 12/17/20   [name], MD  clonazePAM (KLONOPIN) 0.5 MG tablet Take 0.5 tablets (0.25 mg total) by mouth 2 (two) times daily. Patient not taking: No sig reported 03/15/20 12/16/20  Pieter Partridge, DO  ferrous sulfate (FEROSUL) 325 (65 FE) MG tablet Take 1 tablet (325 mg total) by mouth daily. Patient not taking: No sig reported 06/09/20 12/16/20  Sharion Settler, DO  metFORMIN (GLUCOPHAGE-XR) 500 MG 24 hr tablet TAKE 2 TABLETS BY MOUTH QD WITH EVENING MEAL FOR DIABETES Patient not  taking: Reported on 12/02/2020 01/07/20 12/16/20  Martyn Malay, MD  promethazine (PHENERGAN) 12.5 MG tablet Take 1 tablet (12.5 mg total) by mouth every 8 (eight) hours as needed for nausea or vomiting. Patient not taking: No sig reported 10/15/19 12/16/20  Shirley, [place], DO    Family History History reviewed. No pertinent family history.  Social History Social History   Tobacco Use   Smoking status: Every Day    Packs/day: 0.50    Years: 46.00    Pack years: 23.00    Types: Cigarettes   Smokeless tobacco: Never  Vaping Use   Vaping Use: Never used  Substance Use Topics   Alcohol use: Never   Drug use: Never     Allergies   Patient has no known allergies.   Review of Systems Review of Systems  Constitutional: Negative.   HENT: Negative.    Eyes: Negative.   Respiratory: Negative.    Cardiovascular: Negative.   Gastrointestinal: Negative.   Skin:        Redness, warm to touch to rt chin area  Neurological: Negative.     Physical Exam Triage Vital Signs ED Triage Vitals [02/07/21 0849]  Enc Vitals Group     BP 130/90     Pulse Rate 73     Resp 18     Temp 98.8 F (37.1 C)     Temp Source Oral     SpO2 98 %     Weight      Height      Head Circumference      Peak Flow      Pain Score 0     Pain Loc      Pain Edu?      Excl. in [place]?    No data found.  Updated Vital Signs BP 130/90 (BP Location: Left Arm)   Pulse 73   Temp 98.8 F (37.1 C) (Oral)   Resp 18   SpO2 98%   Visual Acuity Right Eye Distance:   Left Eye Distance:   Bilateral Distance:    Right Eye Near:   Left Eye Near:    Bilateral Near:     Physical Exam Constitutional:      Appearance: Normal appearance.  HENT:     Right Ear: Tympanic membrane normal.     Left Ear: Tympanic membrane normal.     Nose: Nose normal.     Mouth/Throat:     Mouth: Mucous membranes are moist.  Eyes:     Pupils: Pupils are equal, round, and reactive to light.  Cardiovascular:     Rate and  Rhythm: Normal rate.  Pulmonary:     Effort: Pulmonary effort is normal.  Abdominal:     General: Abdomen is flat.  Musculoskeletal:     Cervical back: Normal range of  motion.  Skin:    Findings: Erythema present.     Comments: Slight erythema noted to rt law/ chin area slight edema. Notice hair follicle regrowth. No open wounds    Neurological:     Mental Status: She is alert.     UC Treatments / Results  Labs (all labs ordered are listed, but only abnormal results are displayed) Labs Reviewed - No data to display  EKG   Radiology No results found.  Procedures Procedures (including critical care time)  Medications Ordered in UC Medications - No data to display  Initial Impression / Assessment and Plan / UC Course  I have reviewed the triage vital signs and the nursing notes.  Pertinent labs & imaging results that were available during my care of the patient were reviewed by me and considered in my medical decision making (see chart for details).    Ont to use warm compress  Take full dose of medication with food  If sx are worse go to er   Final Clinical Impressions(s) / UC Diagnoses   Final diagnoses:  Folliculitis  Cellulitis of other specified site     Discharge Instructions      cOnt to use warm compress  Take full dose of medication with food  If sx are worse go to er       ED Prescriptions     Medication Sig Dispense Auth. Provider   cephALEXin (KEFLEX) 500 MG capsule Take 1 capsule (500 mg total) by mouth 4 (four) times daily. 20 capsule Marney Setting, NP      PDMP not reviewed this encounter.   Marney Setting, NP 02/07/21 [phone]

## 2021-02-07 NOTE — Discharge Instructions (Addendum)
cOnt to use warm compress  Take full dose of medication with food  If sx are worse go to er  Take tylenol as needed for pain

## 2021-02-09 ENCOUNTER — Ambulatory Visit: Payer: Medicaid Other

## 2021-02-09 ENCOUNTER — Other Ambulatory Visit: Payer: Self-pay

## 2021-02-09 DIAGNOSIS — M25661 Stiffness of right knee, not elsewhere classified: Secondary | ICD-10-CM

## 2021-02-09 DIAGNOSIS — M6281 Muscle weakness (generalized): Secondary | ICD-10-CM

## 2021-02-09 DIAGNOSIS — M25561 Pain in right knee: Secondary | ICD-10-CM

## 2021-02-09 DIAGNOSIS — G8929 Other chronic pain: Secondary | ICD-10-CM

## 2021-02-09 DIAGNOSIS — R2681 Unsteadiness on feet: Secondary | ICD-10-CM

## 2021-02-09 NOTE — Patient Instructions (Signed)
  [id number]

## 2021-02-09 NOTE — Therapy (Signed)
[place], [place], [address] Phone: [phone]   Fax:  [phone]  Physical Therapy Treatment  Patient Details  Name: Monica Watson MRN: [id number] Date of Birth: May 26, 1956 Referring Provider (PT): [name]   Encounter Date: 02/09/2021   PT End of Session - 02/09/21 1648     Visit Number 4    Number of Visits 17    Date for PT Re-Evaluation 03/06/21    Authorization Type [place] MCD    Authorization Time Period 3 visits from 02/03/2021-02/16/2021    Authorization - Visit Number 2    Authorization - Number of Visits 3    PT Start Time 1610    PT Stop Time 1655    PT Time Calculation (min) 45 min    Activity Tolerance Patient tolerated treatment well    Behavior During Therapy [facility] for tasks assessed/performed             Past Medical History:  Diagnosis Date   Arthritis    Cerebrovascular disease    Depression    Hypertension    Stroke ([place])    Type 2 diabetes mellitus ([place])     Past Surgical History:  Procedure Laterality Date   bilateral knee replacements      bilateral shoulder replacement     I & D KNEE WITH POLY EXCHANGE Right 12/16/2020   Procedure: POLY EXCHANGE RIGHT KNEE;  Surgeon: [name], MD;  Location: [facility];  Service: Orthopedics;  Laterality: Right;   left carpal tunnel release      THYROIDECTOMY      There were no vitals filed for this visit.   Subjective Assessment - 02/09/21 1603     Subjective Pt reports feeling better than her last appointment, with 6/10 pain in her R knee. She reports that it was worse this morning bu has resided with medication. She also reports that she doesn't think she will be able to walk without her cane due to feeling unsteadiness in her R leg. She reports daily adherence to her HEP.    Currently in Pain? Yes    Pain Score 6     Pain Location Knee    Pain Orientation Right    Pain Descriptors / Indicators  Aching                [facility] PT Assessment - 02/09/21 0001       AROM   Right Knee Flexion 104      PROM   Left Knee Flexion 107 AAROM with Green strap                           [facility] Adult PT Treatment/Exercise - 02/09/21 0001       Knee/Hip Exercises: Standing   Forward Lunges Other (comment)   2x10 mini-lunge into Bosu ball in // bars   Wall Squat Other (comment)   Wall sit 3x30sec   SLS on Airex pad in // bars 3x30sec BIL      Knee/Hip Exercises: Seated   Long Probation officer;Other (comment)   2x10 eccentric knee extension 2 up, 1 down 10#     Manual Therapy   Manual Therapy Muscle Energy Technique    Muscle Energy Technique R knee flexion contract/ relax MET with green strap 3x4                    PT Education -  02/09/21 1648     Education Details Pt educated on importance of continued knee flexion AAROM, along with proper form when performing new exercises.    Person(s) Educated Patient    Methods Explanation;Demonstration;Handout    Comprehension Verbalized understanding;Returned demonstration              PT Short Term Goals - 02/09/21 1621       PT SHORT TERM GOAL #1   Title Pt will report understanding and adherence to her HEP in order to promote independence in the maintenance of her primary impairments.    Baseline Pt reports adherence to her HEP    Time 4    Period Weeks    Status Achieved    Target Date 02/06/21      PT SHORT TERM GOAL #2   Title -    Baseline -      PT SHORT TERM GOAL #3   Title -    Baseline -      PT SHORT TERM GOAL #4   Title -    Baseline -               PT Long Term Goals - 02/09/21 1621       PT LONG TERM GOAL #1   Title Pt will demonstrate R knee flexion AROM of 125 degrees in order to promote normal gait.    Baseline 104 degrees    Time 8    Period Weeks    Status On-going      PT LONG TERM GOAL #2   Title Pt will report ability to sleep through the night  without being wokendue to knee pain.    Baseline Pt sleeps through the night without being woken by pain.    Time 8    Period Weeks    Status New      PT LONG TERM GOAL #3   Title Pt will demonstrate BIL global hip MMT of 5/5 in order to progress LE strengthening exercises without limitation.    Baseline BIL global hip strength ranging from 4/5 to 4+/5    Time 8    Period Weeks    Status New      PT LONG TERM GOAL #4   Title Pt will report ability to stand >30 minutes without an AD and with pain no greater than 3/10 in order to wash dishes without limitation.    Baseline Unable to stand >15 minutes without >6/10 pain    Time 8    Period Weeks    Status New      PT LONG TERM GOAL #5   Title -    Baseline -                   Plan - 02/09/21 1655     Clinical Impression Statement Pt responded well to all treatment today, demonstrating proper form and no increase in pain with all exercises performed. She leaves with 6/10 pain and reports that she can tell the exercises are helping with her leg strength. She is still limited in R knee flexion ROM, although she has made significant improvement since last visit. She will continue to benefit from skilled PT to address her primary impairments and return to her prior level of function without limitation.    Personal Factors and Comorbidities Comorbidity 3+    Comorbidities See medical hx    Examination-Activity Limitations Bathing;Stand;Bend;Sleep;Squat;Stairs    Examination-Participation Restrictions Yard Work;Cleaning;Community Activity;Laundry  Stability/Clinical Decision Making Stable/Uncomplicated    Clinical Decision Making Low    Rehab Potential Good    PT Frequency 2x / week    PT Duration 8 weeks    PT Treatment/Interventions ADLs/Self Care Home Management;Aquatic Therapy;Biofeedback;Cryotherapy;Moist Heat;Electrical Stimulation;DME Instruction;Gait training;Stair training;Functional mobility training;Therapeutic  activities;Patient/family education;Manual techniques;Manual lymph drainage;Neuromuscular re-education;Balance training;Therapeutic exercise;Compression bandaging;Taping;Joint Manipulations;Scar mobilization;Passive range of motion;Dry needling    PT Next Visit Plan Assess balance, squat, progress LE strengthening/ knee flexion ROM according to TKA protocol    PT Home Exercise Plan [id number]    Consulted and Agree with Plan of Care Patient             Patient will benefit from skilled therapeutic intervention in order to improve the following deficits and impairments:  Abnormal gait, Pain, Decreased scar mobility, Decreased mobility, Decreased endurance, Decreased range of motion, Decreased strength, Hypomobility, Impaired flexibility, Increased edema, Difficulty walking, Decreased balance  Visit Diagnosis: Chronic pain of right knee  Stiffness of right knee, not elsewhere classified  Unsteadiness on feet  Muscle weakness (generalized)     Problem List Patient Active Problem List   Diagnosis Date Noted   Status post revision of total replacement of right knee 12/16/2020   Polyethylene wear of right knee prosthesis, 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/15/2020   Sialadenitis 11/23/2020   Abdominal pain 10/23/2020   Cervical cancer screening 08/10/2020   Bartholin cyst 07/29/2020   History of total knee replacement, left 06/13/2020   History of total knee arthroplasty, right 06/13/2020   Acute right-sided back pain 06/09/2020   Depression, major, single episode, mild ([place]) 05/12/2020   Vision changes 12/31/2019   Bilateral knee pain 10/08/2019   Poor dentition 10/08/2019   Chronic left shoulder pain 08/31/2019   Hemorrhoids 04/04/2019   History of stroke 01/01/2019   Hypertension associated with diabetes ([place]) 01/01/2019   Pre-diabetes 01/01/2019   Hot flashes 01/01/2019   Onychomycosis 01/01/2019    Vanessa [place], PT, DPT 02/09/21 4:57 PM   [facility] [address] [place], [place], [address] Phone: [phone]   Fax:  [phone]  Name: Monica Watson MRN: [id number] Date of Birth: 1956/02/04

## 2021-02-13 ENCOUNTER — Ambulatory Visit: Payer: Medicaid Other

## 2021-02-15 ENCOUNTER — Ambulatory Visit: Payer: Medicaid Other

## 2021-02-20 ENCOUNTER — Other Ambulatory Visit: Payer: Self-pay

## 2021-02-20 ENCOUNTER — Ambulatory Visit: Payer: Medicaid Other

## 2021-02-20 ENCOUNTER — Telehealth: Payer: Self-pay

## 2021-02-20 DIAGNOSIS — M25561 Pain in right knee: Secondary | ICD-10-CM | POA: Diagnosis not present

## 2021-02-20 DIAGNOSIS — M6281 Muscle weakness (generalized): Secondary | ICD-10-CM

## 2021-02-20 DIAGNOSIS — M25661 Stiffness of right knee, not elsewhere classified: Secondary | ICD-10-CM

## 2021-02-20 DIAGNOSIS — G8929 Other chronic pain: Secondary | ICD-10-CM

## 2021-02-20 DIAGNOSIS — R2681 Unsteadiness on feet: Secondary | ICD-10-CM

## 2021-02-20 NOTE — Telephone Encounter (Signed)
Made in error

## 2021-02-20 NOTE — Therapy (Signed)
[place], [place], [address] Phone: [phone]   Fax:  [phone]  Physical Therapy Treatment/ Re-evaluation  Progress Note Reporting Period 01/09/2021 to 02/20/2021   See note below for Objective Data and Assessment of Progress/Goals.      Patient Details  Name: Monica Watson MRN: [id number] Date of Birth: 10-Feb-1956 Referring Provider (PT): [name]   Encounter Date: 02/20/2021   PT End of Session - 02/20/21 1652     Visit Number 5    Number of Visits 17    Date for PT Re-Evaluation 03/06/21    Authorization Type [place] MCD    Authorization Time Period 3 visits from 02/03/2021-02/16/2021    Authorization - Visit Number 3    Authorization - Number of Visits 3    PT Start Time 1640    PT Stop Time 1703    PT Time Calculation (min) 23 min    Activity Tolerance Patient tolerated treatment well    Behavior During Therapy [facility] for tasks assessed/performed             Past Medical History:  Diagnosis Date   Arthritis    Cerebrovascular disease    Depression    Hypertension    Stroke ([place])    Type 2 diabetes mellitus ([place])     Past Surgical History:  Procedure Laterality Date   bilateral knee replacements      bilateral shoulder replacement     I & D KNEE WITH POLY EXCHANGE Right 12/16/2020   Procedure: POLY EXCHANGE RIGHT KNEE;  Surgeon: [name], MD;  Location: [facility];  Service: Orthopedics;  Laterality: Right;   left carpal tunnel release      THYROIDECTOMY      There were no vitals filed for this visit.   Subjective Assessment - 02/20/21 1641     Subjective Pt reports experiencing "cramping" pain in her anterio R knee today, rating the pain at 7/10. She reports doing her HEP regularly. She adds that she has been sleeping without any limitation, although she still can't stand for much longer than 15 minutes due to pain.    Currently in Pain? Yes    Pain  Score 7     Pain Location Knee    Pain Orientation Right    Pain Descriptors / Indicators Aching;Cramping    Pain Type Chronic pain;Surgical pain    Pain Onset More than a month ago    Pain Frequency Constant                [facility] PT Assessment - 02/20/21 0001       AROM   Right Knee Flexion 107    Left Knee Extension 0      Strength   Right Hip Flexion 5/5    Right Hip Extension 5/5    Right Hip ABduction 5/5    Left Hip Flexion 5/5    Left Hip Extension 5/5    Left Hip ABduction 5/5    Right Knee Flexion 5/5    Right Knee Extension 5/5    Left Knee Flexion 5/5    Left Knee Extension 5/5                           [facility] Adult PT Treatment/Exercise - 02/20/21 0001       Knee/Hip Exercises: Standing   Wall Squat Other (comment)   Wall sit 3x30sec  Knee/Hip Exercises: Seated   Long Probation officer;Other (comment)   2x10 eccentric knee extension 2 up, 1 down 10#                   PT Education - 02/20/21 1651     Education Details Pt educated on importance of being at her appointments on time, as well as ways to progress her HEP.    Person(s) Educated Patient    Methods Explanation    Comprehension Verbalized understanding              PT Short Term Goals - 02/09/21 1621       PT SHORT TERM GOAL #1   Title Pt will report understanding and adherence to her HEP in order to promote independence in the maintenance of her primary impairments.    Baseline Pt reports adherence to her HEP    Time 4    Period Weeks    Status Achieved    Target Date 02/06/21      PT SHORT TERM GOAL #2   Title -    Baseline -      PT SHORT TERM GOAL #3   Title -    Baseline -      PT SHORT TERM GOAL #4   Title -    Baseline -               PT Long Term Goals - 02/20/21 1648       PT LONG TERM GOAL #1   Title Pt will demonstrate R knee flexion AROM of 125 degrees in order to promote normal gait.    Baseline 107 degrees  (02/20/2021)    Time 8    Period Weeks    Status On-going      PT LONG TERM GOAL #2   Title Pt will report ability to sleep through the night without being wokendue to knee pain.    Baseline Pt sleeps through the night without being woken by pain.    Time 8    Period Weeks    Status Achieved      PT LONG TERM GOAL #3   Title Pt will demonstrate BIL global hip MMT of 5/5 in order to progress LE strengthening exercises without limitation.    Baseline BIL global hip strength 5/5    Time 8    Period Weeks    Status Achieved      PT LONG TERM GOAL #4   Title Pt will report ability to stand >30 minutes without an AD and with pain no greater than 3/10 in order to wash dishes without limitation.    Baseline Unable to stand >15 minutes without >6/10 pain    Time 8    Period Weeks    Status On-going      PT LONG TERM GOAL #5   Title -    Baseline -                   Plan - 02/20/21 1653     Clinical Impression Statement Pt arrived 25 minutes late to her appointment, leading to a truncated session. She responded well to all treatment today, demonstrating proper form and no increase in pain with all exercises performed. Upon re-assessment, the pt has made functional improvement in R knee AROM, knee strength, and global hip strength. She will continue to benefit from skilled PT to address her primary impairments and return to her prior level of function without  limitation.    Personal Factors and Comorbidities Comorbidity 3+    Comorbidities See medical hx    Examination-Activity Limitations Bathing;Stand;Bend;Sleep;Squat;Stairs    Examination-Participation Restrictions Yard Work;Cleaning;Community Activity;Laundry    Stability/Clinical Decision Making Stable/Uncomplicated    Clinical Decision Making Low    Rehab Potential Good    PT Frequency 2x / week    PT Duration 8 weeks    PT Treatment/Interventions ADLs/Self Care Home Management;Aquatic  Therapy;Biofeedback;Cryotherapy;Moist Heat;Electrical Stimulation;DME Instruction;Gait training;Stair training;Functional mobility training;Therapeutic activities;Patient/family education;Manual techniques;Manual lymph drainage;Neuromuscular re-education;Balance training;Therapeutic exercise;Compression bandaging;Taping;Joint Manipulations;Scar mobilization;Passive range of motion;Dry needling    PT Next Visit Plan Assess balance, squat, progress LE strengthening/ knee flexion ROM according to TKA protocol    PT Home Exercise Plan [id number]    Consulted and Agree with Plan of Care Patient             Patient will benefit from skilled therapeutic intervention in order to improve the following deficits and impairments:  Abnormal gait, Pain, Decreased scar mobility, Decreased mobility, Decreased endurance, Decreased range of motion, Decreased strength, Hypomobility, Impaired flexibility, Increased edema, Difficulty walking, Decreased balance  Visit Diagnosis: Chronic pain of right knee  Stiffness of right knee, not elsewhere classified  Unsteadiness on feet  Muscle weakness (generalized)     Problem List Patient Active Problem List   Diagnosis Date Noted   Status post revision of total replacement of right knee 12/16/2020   Polyethylene wear of right knee prosthesis, 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/15/2020   Sialadenitis 11/23/2020   Abdominal pain 10/23/2020   Cervical cancer screening 08/10/2020   Bartholin cyst 07/29/2020   History of total knee replacement, left 06/13/2020   History of total knee arthroplasty, right 06/13/2020   Acute right-sided back pain 06/09/2020   Depression, major, single episode, mild ([place]) 05/12/2020   Vision changes 12/31/2019   Bilateral knee pain 10/08/2019   Poor dentition 10/08/2019   Chronic left shoulder pain 08/31/2019   Hemorrhoids 04/04/2019   History of stroke 01/01/2019   Hypertension associated with diabetes ([place]) 01/01/2019   Pre-diabetes  01/01/2019   Hot flashes 01/01/2019   Onychomycosis 01/01/2019    Vanessa [place], PT, DPT 02/20/21 5:01 PM   [facility] [address] [place], [place], [address] Phone: [phone]   Fax:  [phone]  Name: Mahkenzie Lyden MRN: [id number] Date of Birth: 25-Nov-1955

## 2021-02-20 NOTE — Patient Instructions (Signed)
Pt instructed to independently progress her current HEP.

## 2021-02-22 ENCOUNTER — Ambulatory Visit: Payer: Medicaid Other

## 2021-02-22 ENCOUNTER — Other Ambulatory Visit: Payer: Self-pay

## 2021-02-22 DIAGNOSIS — G8929 Other chronic pain: Secondary | ICD-10-CM

## 2021-02-22 DIAGNOSIS — M6281 Muscle weakness (generalized): Secondary | ICD-10-CM

## 2021-02-22 DIAGNOSIS — R2681 Unsteadiness on feet: Secondary | ICD-10-CM

## 2021-02-22 DIAGNOSIS — M25561 Pain in right knee: Secondary | ICD-10-CM | POA: Diagnosis not present

## 2021-02-22 DIAGNOSIS — M25661 Stiffness of right knee, not elsewhere classified: Secondary | ICD-10-CM

## 2021-02-22 NOTE — Therapy (Signed)
[place], [place], [address] Phone: [phone]   Fax:  [phone]  Physical Therapy Treatment  Patient Details  Name: Monica Watson MRN: [id number] Date of Birth: 01/09/1956 Referring Provider (PT): [name]   Encounter Date: 02/22/2021   PT End of Session - 02/22/21 1627     Visit Number 6    Number of Visits 17    Date for PT Re-Evaluation 03/06/21    Authorization Type [place] [employer]    Authorization Time Period Pending re-auth    PT Start Time 1605    PT Stop Time [id number]    PT Time Calculation (min) 45 min    Activity Tolerance Patient tolerated treatment well    Behavior During Therapy [facility] for tasks assessed/performed             Past Medical History:  Diagnosis Date   Arthritis    Cerebrovascular disease    Depression    Hypertension    Stroke ([place])    Type 2 diabetes mellitus ([place])     Past Surgical History:  Procedure Laterality Date   bilateral knee replacements      bilateral shoulder replacement     I & D KNEE WITH POLY EXCHANGE Right 12/16/2020   Procedure: POLY EXCHANGE RIGHT KNEE;  Surgeon: [name], MD;  Location: [facility];  Service: Orthopedics;  Laterality: Right;   left carpal tunnel release      THYROIDECTOMY      There were no vitals filed for this visit.   Subjective Assessment - 02/22/21 1603     Subjective Pt reports increased R knee pain today, along with R thigh "cramping" pain that goes down to her R foot. She reports doing her exercises regularly, although she still has pain with her HEP.    Currently in Pain? Yes    Pain Score 9     Pain Location Knee    Pain Orientation Right    Pain Descriptors / Indicators Aching;Cramping    Pain Type Chronic pain;Surgical pain                               [facility] Adult PT Treatment/Exercise - 02/22/21 0001       Knee/Hip Exercises: Aerobic   Stationary Bike x5 minutes  at self-selected pace      Knee/Hip Exercises: Machines for Strengthening   Cybex Leg Press 2 up, 1 down (on R) 40# 2x10      Knee/Hip Exercises: Standing   Functional Squat Other (comment)   2x8 50% depth     Knee/Hip Exercises: Seated   Long Probation officer;Other (comment)   2x10 eccentric knee extension 2 up, 1 down 15#     Manual Therapy   Manual Therapy Joint mobilization    Joint Mobilization Grade 4 tib-fem AP/PA joint mobilization                    PT Education - 02/22/21 1626     Education Details Pt instructed how to initiate a walking program.    Person(s) Educated Patient    Methods Explanation    Comprehension Verbalized understanding              PT Short Term Goals - 02/09/21 1621       PT SHORT TERM GOAL #1   Title Pt will report understanding and adherence to her HEP  in order to promote independence in the maintenance of her primary impairments.    Baseline Pt reports adherence to her HEP    Time 4    Period Weeks    Status Achieved    Target Date 02/06/21      PT SHORT TERM GOAL #2   Title -    Baseline -      PT SHORT TERM GOAL #3   Title -    Baseline -      PT SHORT TERM GOAL #4   Title -    Baseline -               PT Long Term Goals - 02/20/21 1648       PT LONG TERM GOAL #1   Title Pt will demonstrate R knee flexion AROM of 125 degrees in order to promote normal gait.    Baseline 107 degrees (02/20/2021)    Time 8    Period Weeks    Status On-going      PT LONG TERM GOAL #2   Title Pt will report ability to sleep through the night without being wokendue to knee pain.    Baseline Pt sleeps through the night without being woken by pain.    Time 8    Period Weeks    Status Achieved      PT LONG TERM GOAL #3   Title Pt will demonstrate BIL global hip MMT of 5/5 in order to progress LE strengthening exercises without limitation.    Baseline BIL global hip strength 5/5    Time 8    Period Weeks     Status Achieved      PT LONG TERM GOAL #4   Title Pt will report ability to stand >30 minutes without an AD and with pain no greater than 3/10 in order to wash dishes without limitation.    Baseline Unable to stand >15 minutes without >6/10 pain    Time 8    Period Weeks    Status On-going      PT LONG TERM GOAL #5   Title -    Baseline -                   Plan - 02/22/21 1637     Clinical Impression Statement Pt responded well to all interventions today, demonstrating good form and minor pain with selected interventions. The pt was pleased with her ability to cycle on the stationary bike today and reports improved mobility following the intervention. She also demonstrates improved function strength with exercises, able to increase resistance with some exercises. She will continue to benefit from skilled PT to address her primary impairments and return to her prior level of function without limiation due to pain.    Personal Factors and Comorbidities Comorbidity 3+    Comorbidities See medical hx    Examination-Activity Limitations Bathing;Stand;Bend;Sleep;Squat;Stairs    Examination-Participation Restrictions Yard Work;Cleaning;Community Activity;Laundry    Stability/Clinical Decision Making Stable/Uncomplicated    Clinical Decision Making Low    Rehab Potential Good    PT Frequency 2x / week    PT Duration 8 weeks    PT Treatment/Interventions ADLs/Self Care Home Management;Aquatic Therapy;Biofeedback;Cryotherapy;Moist Heat;Electrical Stimulation;DME Instruction;Gait training;Stair training;Functional mobility training;Therapeutic activities;Patient/family education;Manual techniques;Manual lymph drainage;Neuromuscular re-education;Balance training;Therapeutic exercise;Compression bandaging;Taping;Joint Manipulations;Scar mobilization;Passive range of motion;Dry needling    PT Next Visit Plan Assess balance, squat, progress LE strengthening/ knee flexion ROM according to TKA  protocol    PT Home  Exercise Plan [id number]    Consulted and Agree with Plan of Care Patient             Patient will benefit from skilled therapeutic intervention in order to improve the following deficits and impairments:  Abnormal gait, Pain, Decreased scar mobility, Decreased mobility, Decreased endurance, Decreased range of motion, Decreased strength, Hypomobility, Impaired flexibility, Increased edema, Difficulty walking, Decreased balance  Visit Diagnosis: Chronic pain of right knee  Stiffness of right knee, not elsewhere classified  Unsteadiness on feet  Muscle weakness (generalized)     Problem List Patient Active Problem List   Diagnosis Date Noted   Status post revision of total replacement of right knee 12/16/2020   Polyethylene wear of right knee prosthesis, 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/15/2020   Sialadenitis 11/23/2020   Abdominal pain 10/23/2020   Cervical cancer screening 08/10/2020   Bartholin cyst 07/29/2020   History of total knee replacement, left 06/13/2020   History of total knee arthroplasty, right 06/13/2020   Acute right-sided back pain 06/09/2020   Depression, major, single episode, mild ([place]) 05/12/2020   Vision changes 12/31/2019   Bilateral knee pain 10/08/2019   Poor dentition 10/08/2019   Chronic left shoulder pain 08/31/2019   Hemorrhoids 04/04/2019   History of stroke 01/01/2019   Hypertension associated with diabetes ([place]) 01/01/2019   Pre-diabetes 01/01/2019   Hot flashes 01/01/2019   Onychomycosis 01/01/2019    Vanessa Audrain, PT, DPT 02/22/21 4:53 PM    [facility] [address] [place], [place], [address] Phone: [phone]   Fax:  [phone]  Name: Monica Watson MRN: [id number] Date of Birth: Aug 10, 1955

## 2021-02-22 NOTE — Patient Instructions (Signed)
  [id number]

## 2021-02-24 ENCOUNTER — Ambulatory Visit (INDEPENDENT_AMBULATORY_CARE_PROVIDER_SITE_OTHER): Payer: Medicaid Other | Admitting: Podiatry

## 2021-02-24 ENCOUNTER — Encounter: Payer: Self-pay | Admitting: Podiatry

## 2021-02-24 ENCOUNTER — Other Ambulatory Visit: Payer: Self-pay

## 2021-02-24 DIAGNOSIS — M79675 Pain in left toe(s): Secondary | ICD-10-CM | POA: Diagnosis not present

## 2021-02-24 DIAGNOSIS — B351 Tinea unguium: Secondary | ICD-10-CM | POA: Diagnosis not present

## 2021-02-24 DIAGNOSIS — M79674 Pain in right toe(s): Secondary | ICD-10-CM

## 2021-02-24 MED ORDER — CICLOPIROX 8 % EX SOLN: CUTANEOUS | 11 refills | Status: DC

## 2021-02-28 ENCOUNTER — Ambulatory Visit: Payer: Medicaid Other | Attending: Orthopaedic Surgery

## 2021-02-28 ENCOUNTER — Other Ambulatory Visit: Payer: Self-pay

## 2021-02-28 DIAGNOSIS — M6281 Muscle weakness (generalized): Secondary | ICD-10-CM | POA: Diagnosis present

## 2021-02-28 DIAGNOSIS — G8929 Other chronic pain: Secondary | ICD-10-CM | POA: Insufficient documentation

## 2021-02-28 DIAGNOSIS — M25661 Stiffness of right knee, not elsewhere classified: Secondary | ICD-10-CM | POA: Insufficient documentation

## 2021-02-28 DIAGNOSIS — M25561 Pain in right knee: Secondary | ICD-10-CM | POA: Diagnosis present

## 2021-02-28 DIAGNOSIS — R2681 Unsteadiness on feet: Secondary | ICD-10-CM | POA: Diagnosis present

## 2021-02-28 NOTE — Therapy (Signed)
[place], [place], [address] Phone: [phone]   Fax:  [phone]  Physical Therapy Treatment  Patient Details  Name: Monica Watson MRN: [id number] Date of Birth: 1955-08-17 Referring Provider (PT): [name]   Encounter Date: 02/28/2021   PT End of Session - 02/28/21 1638     Visit Number 7    Number of Visits 17    Date for PT Re-Evaluation 03/06/21    Authorization Type [place] MCD    Authorization Time Period Pending re-auth    PT Start Time 1615    PT Stop Time 1700    PT Time Calculation (min) 45 min    Activity Tolerance Patient tolerated treatment well    Behavior During Therapy [facility] for tasks assessed/performed             Past Medical History:  Diagnosis Date   Arthritis    Cerebrovascular disease    Depression    Hypertension    Stroke ([place])    Type 2 diabetes mellitus ([place])     Past Surgical History:  Procedure Laterality Date   bilateral knee replacements      bilateral shoulder replacement     I & D KNEE WITH POLY EXCHANGE Right 12/16/2020   Procedure: POLY EXCHANGE RIGHT KNEE;  Surgeon: [name], MD;  Location: [facility];  Service: Orthopedics;  Laterality: Right;   left carpal tunnel release      THYROIDECTOMY      There were no vitals filed for this visit.   Subjective Assessment - 02/28/21 1613     Subjective Pt reports a "pulling" feeling in her R knee today, although she states the pain has been low. She also reports walking for >30 minutes yesterday with no increase in pain. She reports that her L leg has continued to be in pain, but the recent change in medication has been helping. She adds that she has been adherent with her HEP.    Currently in Pain? Yes    Pain Score 8     Pain Location Knee    Pain Orientation Right    Pain Descriptors / Indicators Aching;Cramping    Pain Type Chronic pain;Surgical pain    Pain Radiating Towards  Down to R foot                               [facility] Adult PT Treatment/Exercise - 02/28/21 0001       Knee/Hip Exercises: Aerobic   Stationary Bike x5 minutes at self-selected pace   While collecting subjective information     Knee/Hip Exercises: Machines for Strengthening   Cybex Leg Press 2 up, 1 down (on R) 40# 2x10    Hip Cybex Abduction/ extension 25# 2x10 BIL each      Knee/Hip Exercises: Standing   Forward Step Up Both;10 reps;Step Height: 6"    Step Down Both;10 reps;Step Height: 6"    Functional Squat Other (comment)   2x8 50% depth     Knee/Hip Exercises: Seated   Long Probation officer;Other (comment)   2x10 eccentric knee extension 2 up, 1 down 20#                     PT Short Term Goals - 02/09/21 1621       PT SHORT TERM GOAL #1   Title Pt will report understanding and adherence to  her HEP in order to promote independence in the maintenance of her primary impairments.    Baseline Pt reports adherence to her HEP    Time 4    Period Weeks    Status Achieved    Target Date 02/06/21      PT SHORT TERM GOAL #2   Title -    Baseline -      PT SHORT TERM GOAL #3   Title -    Baseline -      PT SHORT TERM GOAL #4   Title -    Baseline -               PT Long Term Goals - 02/20/21 1648       PT LONG TERM GOAL #1   Title Pt will demonstrate R knee flexion AROM of 125 degrees in order to promote normal gait.    Baseline 107 degrees (02/20/2021)    Time 8    Period Weeks    Status On-going      PT LONG TERM GOAL #2   Title Pt will report ability to sleep through the night without being wokendue to knee pain.    Baseline Pt sleeps through the night without being woken by pain.    Time 8    Period Weeks    Status Achieved      PT LONG TERM GOAL #3   Title Pt will demonstrate BIL global hip MMT of 5/5 in order to progress LE strengthening exercises without limitation.    Baseline BIL global hip strength 5/5     Time 8    Period Weeks    Status Achieved      PT LONG TERM GOAL #4   Title Pt will report ability to stand >30 minutes without an AD and with pain no greater than 3/10 in order to wash dishes without limitation.    Baseline Unable to stand >15 minutes without >6/10 pain    Time 8    Period Weeks    Status On-going      PT LONG TERM GOAL #5   Title -    Baseline -                   Plan - 02/28/21 1638     Clinical Impression Statement Pt responded well to all interventions today, demonstrating good form and minor pain with selected interventions. Her pain returned to baseline at the end of the session; she leaves clinic with 8/10 pain and was instructed to go home, ice, and rest.  She will continue to benefit from skilled PT to address her primary impairments and return to her prior level of function without limiation due to pain.    Personal Factors and Comorbidities Comorbidity 3+    Comorbidities See medical hx    Examination-Activity Limitations Bathing;Stand;Bend;Sleep;Squat;Stairs    Examination-Participation Restrictions Yard Work;Cleaning;Community Activity;Laundry    Stability/Clinical Decision Making Stable/Uncomplicated    Clinical Decision Making Low    Rehab Potential Good    PT Frequency 2x / week    PT Duration 8 weeks    PT Treatment/Interventions ADLs/Self Care Home Management;Aquatic Therapy;Biofeedback;Cryotherapy;Moist Heat;Electrical Stimulation;DME Instruction;Gait training;Stair training;Functional mobility training;Therapeutic activities;Patient/family education;Manual techniques;Manual lymph drainage;Neuromuscular re-education;Balance training;Therapeutic exercise;Compression bandaging;Taping;Joint Manipulations;Scar mobilization;Passive range of motion;Dry needling    PT Next Visit Plan Assess balance, squat, progress LE strengthening/ knee flexion ROM according to TKA protocol    PT Home Exercise Plan [id number]    Consulted and  Agree with Plan of  Care Patient             Patient will benefit from skilled therapeutic intervention in order to improve the following deficits and impairments:  Abnormal gait, Pain, Decreased scar mobility, Decreased mobility, Decreased endurance, Decreased range of motion, Decreased strength, Hypomobility, Impaired flexibility, Increased edema, Difficulty walking, Decreased balance  Visit Diagnosis: Chronic pain of right knee  Stiffness of right knee, not elsewhere classified  Unsteadiness on feet  Muscle weakness (generalized)     Problem List Patient Active Problem List   Diagnosis Date Noted   Status post revision of total replacement of right knee 12/16/2020   Polyethylene wear of right knee prosthesis, 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/15/2020   Sialadenitis 11/23/2020   Abdominal pain 10/23/2020   Cervical cancer screening 08/10/2020   Bartholin cyst 07/29/2020   History of total knee replacement, left 06/13/2020   History of total knee arthroplasty, right 06/13/2020   Acute right-sided back pain 06/09/2020   Depression, major, single episode, mild ([place]) 05/12/2020   Vision changes 12/31/2019   Bilateral knee pain 10/08/2019   Poor dentition 10/08/2019   Chronic left shoulder pain 08/31/2019   Hemorrhoids 04/04/2019   History of stroke 01/01/2019   Hypertension associated with diabetes ([place]) 01/01/2019   Pre-diabetes 01/01/2019   Hot flashes 01/01/2019   Onychomycosis 01/01/2019    Vanessa Plover, PT, DPT 02/28/21 4:58 PM   [facility] [address] [place], [place], [address] Phone: [phone]   Fax:  [phone]  Name: Monica Watson MRN: [id number] Date of Birth: 07/11/1955

## 2021-02-28 NOTE — Progress Notes (Signed)
  Subjective:  Patient ID: Monica Watson, female    DOB: [birth year],  MRN: [id number]  65 y.o. female presents with painful thick toenails that are difficult to trim. Pain interferes with ambulation. Aggravating factors include wearing enclosed shoe gear. Pain is relieved with periodic professional debridement..    She is requesting refill of [place] Nail Lacquer today.  PCP: Sharion Settler, DO and last visit was: May, 2022.  Review of Systems: Negative except as noted in the HPI.   No Known Allergies  Objective:  There were no vitals filed for this visit. Constitutional Patient is a pleasant 65 y.o. African American female in NAD. AAO x 3.  Vascular Capillary fill time to digits immediate b/l.  DP/PT pulse(s) are palpable b/l lower extremities. Pedal hair absent b/l.  Lower extremity skin temperature gradient within normal limits. No pain with calf compression b/l. No edema noted b/l lower extremities. No cyanosis or clubbing noted.  Neurologic Protective sensation intact 5/5 intact bilaterally with 10g monofilament b/l.  Dermatologic Pedal skin is warm and supple b/l.  No open wounds b/l lower extremities. No interdigital macerations b/l lower extremities. Toenails 1-5 b/l elongated, discolored, dystrophic, thickened, crumbly with subungual debris and tenderness to dorsal palpation. Porokeratotic lesions noted plantar aspect of both feet.  Orthopedic: Normal muscle strength 4/5 to all lower extremity muscle groups bilaterally. Patient ambulates independent of any assistive aids. Hallux valgus with bunion deformity noted b/l lower extremities. Hammertoe(s) noted to the L 2nd toe and R 2nd toe.   Hemoglobin A1C Latest Ref Rng & Units 12/13/2020 06/09/2020  HGBA1C 4.8 - 5.6 % 6.2(H) 6.4(A)  Some recent data might be hidden   Assessment:   1. Pain due to onychomycosis of toenails of both feet    Plan:  Patient was evaluated and treated and all questions answered. Consent given for  treatment as described below: -Examined patient. -Medicaid ABN for paring of porokeratotic lesions : patient refused to sign on today. Copy has been placed in patient chart. -Refilled Penlac Nail Lacquer today for her onychomycosis. -Patient to continue soft, supportive shoe gear daily. -Toenails 1-5 b/l were debrided in length and girth with sterile nail nippers and dremel without iatrogenic bleeding.  -Patient to report any pedal injuries to medical professional immediately. -Patient/POA to call should there be question/concern in the interim.  Return in about 3 months (around 05/26/2021).  Marzetta Board, DPM

## 2021-03-02 ENCOUNTER — Ambulatory Visit: Payer: Medicaid Other

## 2021-03-06 ENCOUNTER — Ambulatory Visit: Payer: Medicaid Other

## 2021-03-06 ENCOUNTER — Other Ambulatory Visit: Payer: Self-pay

## 2021-03-06 DIAGNOSIS — G8929 Other chronic pain: Secondary | ICD-10-CM

## 2021-03-06 DIAGNOSIS — M25561 Pain in right knee: Secondary | ICD-10-CM | POA: Diagnosis not present

## 2021-03-06 DIAGNOSIS — M6281 Muscle weakness (generalized): Secondary | ICD-10-CM

## 2021-03-06 DIAGNOSIS — R2681 Unsteadiness on feet: Secondary | ICD-10-CM

## 2021-03-06 DIAGNOSIS — M25661 Stiffness of right knee, not elsewhere classified: Secondary | ICD-10-CM

## 2021-03-06 NOTE — Therapy (Signed)
[place], [place], [address] Phone: [phone]   Fax:  [phone]  Physical Therapy Treatment  Progress Note Reporting Period 01/09/2021 to 03/06/2021   See note below for Objective Data and Assessment of Progress/Goals.     Patient Details  Name: Monica Watson MRN: [id number] Date of Birth: March 29, 1956 Referring Provider (PT): [name]   Encounter Date: 03/06/2021   PT End of Session - 03/06/21 1646     Visit Number 8    Number of Visits 12    Date for PT Re-Evaluation 04/03/21    Authorization Type [place] MCD    Authorization Time Period Pending re-auth    PT Start Time 1615    PT Stop Time 1700    PT Time Calculation (min) 45 min    Activity Tolerance Patient tolerated treatment well    Behavior During Therapy [facility] for tasks assessed/performed             Past Medical History:  Diagnosis Date   Arthritis    Cerebrovascular disease    Depression    Hypertension    Stroke ([place])    Type 2 diabetes mellitus ([place])     Past Surgical History:  Procedure Laterality Date   bilateral knee replacements      bilateral shoulder replacement     I & D KNEE WITH POLY EXCHANGE Right 12/16/2020   Procedure: POLY EXCHANGE RIGHT KNEE;  Surgeon: [name], MD;  Location: [facility];  Service: Orthopedics;  Laterality: Right;   left carpal tunnel release      THYROIDECTOMY      There were no vitals filed for this visit.   Subjective Assessment - 03/06/21 1608     Subjective Pt reports she experienced increased R knee pain and L thigh pain over the weekend, not preceded by any change in activity or exercise. She reports doing only doing her standing exercises over the weekend, stating she did as much as she could.    Currently in Pain? Yes    Pain Score 7     Pain Location Knee    Pain Orientation Right    Pain Descriptors / Indicators Aching;Cramping    Pain Type Chronic  pain;Surgical pain    Pain Onset More than a month ago    Pain Frequency Constant                [facility] PT Assessment - 03/06/21 0001       AROM   Right Knee Flexion 108    Left Knee Extension 0                           [facility] Adult PT Treatment/Exercise - 03/06/21 0001       Knee/Hip Exercises: Standing   Lateral Step Up Both;2 sets;10 reps;Step Height: 6"    Forward Step Up Both;Other (comment);Step Height: 6"    Forward Step Up Limitations Step up with knee drive [id number] BIL    SLS on Airex pad in // bars 3x30sec BIL    Rebounder single leg stance with 1kg ball 2x10 throws BIL      Knee/Hip Exercises: Seated   Long Probation officer;Other (comment)   2x8 eccentric knee extension 2 up, 1 down 25#                    PT Education - 03/06/21 1656  Education Details Pt educated on significance of progress made thus far in PT, as well as POC moving forward.    Person(s) Educated Patient    Methods Explanation    Comprehension Verbalized understanding              PT Short Term Goals - 02/09/21 1621       PT SHORT TERM GOAL #1   Title Pt will report understanding and adherence to her HEP in order to promote independence in the maintenance of her primary impairments.    Baseline Pt reports adherence to her HEP    Time 4    Period Weeks    Status Achieved    Target Date 02/06/21      PT SHORT TERM GOAL #2   Title -    Baseline -      PT SHORT TERM GOAL #3   Title -    Baseline -      PT SHORT TERM GOAL #4   Title -    Baseline -               PT Long Term Goals - 03/06/21 1648       PT LONG TERM GOAL #1   Title Pt will demonstrate R knee flexion AROM of 125 degrees in order to promote normal gait.    Baseline 108 degrees 03/06/2021    Time 8    Period Weeks    Status On-going      PT LONG TERM GOAL #2   Title Pt will report ability to sleep through the night without being wokendue to knee pain.     Baseline Pt sleeps through the night without being woken by pain.    Time 8    Period Weeks    Status Achieved      PT LONG TERM GOAL #3   Title Pt will demonstrate BIL global hip MMT of 5/5 in order to progress LE strengthening exercises without limitation.    Baseline BIL global hip strength 5/5    Time 8    Period Weeks    Status Achieved      PT LONG TERM GOAL #4   Title Pt will report ability to stand >30 minutes without an AD and with pain no greater than 3/10 in order to wash dishes without limitation.    Baseline Unable to stand >15 minutes without >6/10 pain (03/06/2021)    Time 8    Period Weeks    Status On-going      PT LONG TERM GOAL #5   Title -    Baseline -                   Plan - 03/06/21 1651     Clinical Impression Statement Pt responded well to all interventions today, demonstrating good form and minor pain with selected interventions. Her pain returned to baseline at the end of the session; she leaves clinic with 6/10 pain and was instructed to go home, ice, and rest. She demonstrated improved standing tolerance as most of today's session was done in standing. She also demonstrates good functional single leg balance as she was able to perform single leg rebounder exercises with good form. Upon re-assessment of objective measures, the pt still demonstrates decreased R knee flexion AROM. She will continue to benefit from skilled PT to address her primary impairments, namely R knee ROM and functional ability to stand and walk without limitation.    Personal  Factors and Comorbidities Comorbidity 3+    Comorbidities See medical hx    Examination-Activity Limitations Bathing;Stand;Bend;Sleep;Squat;Stairs    Examination-Participation Restrictions Yard Work;Cleaning;Community Activity;Laundry    Stability/Clinical Decision Making Stable/Uncomplicated    Clinical Decision Making Low    Rehab Potential Good    PT Frequency 1x / week    PT Duration 4 weeks     PT Treatment/Interventions ADLs/Self Care Home Management;Aquatic Therapy;Biofeedback;Cryotherapy;Moist Heat;Electrical Stimulation;DME Instruction;Gait training;Stair training;Functional mobility training;Therapeutic activities;Patient/family education;Manual techniques;Manual lymph drainage;Neuromuscular re-education;Balance training;Therapeutic exercise;Compression bandaging;Taping;Joint Manipulations;Scar mobilization;Passive range of motion;Dry needling    PT Next Visit Plan Progress LE strengthening/ knee flexion ROM according to TKA protocol    PT Home Exercise Plan [id number]    Consulted and Agree with Plan of Care Patient             Patient will benefit from skilled therapeutic intervention in order to improve the following deficits and impairments:  Abnormal gait, Pain, Decreased scar mobility, Decreased mobility, Decreased endurance, Decreased range of motion, Decreased strength, Hypomobility, Impaired flexibility, Increased edema, Difficulty walking, Decreased balance  Visit Diagnosis: Chronic pain of right knee  Stiffness of right knee, not elsewhere classified  Unsteadiness on feet  Muscle weakness (generalized)     Problem List Patient Active Problem List   Diagnosis Date Noted   Status post revision of total replacement of right knee 12/16/2020   Polyethylene wear of right knee prosthesis, 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/15/2020   Sialadenitis 11/23/2020   Abdominal pain 10/23/2020   Cervical cancer screening 08/10/2020   Bartholin cyst 07/29/2020   History of total knee replacement, left 06/13/2020   History of total knee arthroplasty, right 06/13/2020   Acute right-sided back pain 06/09/2020   Depression, major, single episode, mild (HCC) 05/12/2020   Vision changes 12/31/2019   Bilateral knee pain 10/08/2019   Poor dentition 10/08/2019   Chronic left shoulder pain 08/31/2019   Hemorrhoids 04/04/2019   History of stroke 01/01/2019   Hypertension associated with  diabetes ([place]) 01/01/2019   Pre-diabetes 01/01/2019   Hot flashes 01/01/2019   Onychomycosis 01/01/2019    Vanessa Willis, PT, DPT 03/06/21 4:58 PM   [facility] [address] [place], [place], [address] Phone: [phone]   Fax:  [phone]  Name: Monica Watson MRN: [id number] Date of Birth: 02-Dec-1955

## 2021-03-08 ENCOUNTER — Ambulatory Visit: Payer: Medicaid Other

## 2021-03-10 ENCOUNTER — Other Ambulatory Visit: Payer: Self-pay

## 2021-03-10 ENCOUNTER — Ambulatory Visit (INDEPENDENT_AMBULATORY_CARE_PROVIDER_SITE_OTHER): Payer: Medicaid Other | Admitting: Family Medicine

## 2021-03-10 VITALS — BP 114/84 | HR 72 | Ht 65.0 in | Wt 196.5 lb

## 2021-03-10 DIAGNOSIS — F17210 Nicotine dependence, cigarettes, uncomplicated: Secondary | ICD-10-CM

## 2021-03-10 DIAGNOSIS — R590 Localized enlarged lymph nodes: Secondary | ICD-10-CM

## 2021-03-10 NOTE — Progress Notes (Signed)
    SUBJECTIVE:   CHIEF COMPLAINT / HPI:   Left neck swelling Patient presents to the clinic today out of concern for swelling in her left neck.  She reports that it has been present for at least 2 months and that she has seen other physicians in the last few months and was recently prescribed antibiotics.  She reports the antibiotics have not helped with the swelling.  She reports the lesion is nonpainful and is mobile.  Denies any recent weight loss.  Reports that she has been smoking cigarettes since she was 65 years old and that at this point a pack of cigarettes will last for 2 to 3 days.  No recent illnesses.  OBJECTIVE:   BP 114/84   Pulse 72   Ht '5\' 5"'$  (1.651 m)   Wt 196 lb 8 oz (89.1 kg)   SpO2 100%   BMI 32.70 kg/m   General: Well-appearing 65 year old female, no acute distress HEENT: Patient has left-sided submandibular swelling and a solid mass consistent with enlarged lymph node.  The node is nontender.  No other anterior cervical chain lymph nodes are swollen. Cardiac: Regular rate and rhythm, no murmurs appreciated Respiratory: Normal work of breathing, lungs clear to auscultation bilaterally Abdomen: Soft, nontender, positive bowel sounds MSK: No gross abnormalities  ASSESSMENT/PLAN:   Cervical lymphadenopathy Patient has had a swollen lymph node under left mandible for approximately 2 months.  Unclear etiology but concern for malignancy.  Have ordered low-dose lung CT given she has never been screened for lung cancer and has a significant smoking history.  Have referred to ENT.  We will also collect CBC today with differential.  Follow-up after meeting with specialist.  Strict ED and return precautions given.     Gifford Shave, MD [place]

## 2021-03-10 NOTE — Patient Instructions (Addendum)
It was wonderful seeing you today.  I am sorry you are still having issues with the swelling in your neck.  I want you to continue the antibiotics as prescribed but I have placed a referral for an ear nose and throat doctor to evaluate you.  I also want to get a CT scan of your chest given your smoking history.  I am going to collect some lab work today.  I will call you with the results from these tests if there are any abnormalities.  If you have any questions or concerns please call the clinic.  I hope you have a wonderful afternoon!

## 2021-03-11 DIAGNOSIS — R590 Localized enlarged lymph nodes: Secondary | ICD-10-CM | POA: Insufficient documentation

## 2021-03-11 LAB — CBC WITH DIFFERENTIAL
Basophils Absolute: 0 10*3/uL (ref 0.0–0.2)
Basos: 1 %
EOS (ABSOLUTE): 0.1 10*3/uL (ref 0.0–0.4)
Eos: 2 %
Hematocrit: 45.1 % (ref 34.0–46.6)
Hemoglobin: 15.3 g/dL (ref 11.1–15.9)
Immature Grans (Abs): 0 10*3/uL (ref 0.0–0.1)
Immature Granulocytes: 0 %
Lymphocytes Absolute: 1.9 10*3/uL (ref 0.7–3.1)
Lymphs: 49 %
MCH: 31.6 pg (ref 26.6–33.0)
MCHC: 33.9 g/dL (ref 31.5–35.7)
MCV: 93 fL (ref 79–97)
Monocytes Absolute: 0.5 10*3/uL (ref 0.1–0.9)
Monocytes: 12 %
Neutrophils Absolute: 1.4 10*3/uL (ref 1.4–7.0)
Neutrophils: 36 %
RBC: 4.84 x10E6/uL (ref 3.77–5.28)
RDW: 12.3 % (ref 11.7–15.4)
WBC: 3.8 10*3/uL (ref 3.4–10.8)

## 2021-03-11 NOTE — Assessment & Plan Note (Signed)
Patient has had a swollen lymph node under left mandible for approximately 2 months.  Unclear etiology but concern for malignancy.  Have ordered low-dose lung CT given she has never been screened for lung cancer and has a significant smoking history.  Have referred to ENT.  We will also collect CBC today with differential.  Follow-up after meeting with specialist.  Strict ED and return precautions given.

## 2021-03-13 ENCOUNTER — Telehealth: Payer: Self-pay

## 2021-03-13 NOTE — Telephone Encounter (Signed)
Attempted to reach patient to inform of appt at [facility] for CT Scan of Lungs. On Fri Sep 23rd at 4:45pm. LVM. Salvatore Marvel, CMA

## 2021-03-17 ENCOUNTER — Ambulatory Visit (HOSPITAL_COMMUNITY): Payer: Medicaid Other

## 2021-03-23 ENCOUNTER — Ambulatory Visit: Payer: Medicaid Other

## 2021-03-24 ENCOUNTER — Ambulatory Visit (HOSPITAL_COMMUNITY)
Admission: RE | Admit: 2021-03-24 | Discharge: 2021-03-24 | Disposition: A | Discharge status: Home / Self Care | Payer: Medicaid Other | Source: Ambulatory Visit | Attending: Family Medicine | Admitting: Family Medicine

## 2021-03-24 ENCOUNTER — Other Ambulatory Visit: Payer: Self-pay

## 2021-03-24 DIAGNOSIS — F17210 Nicotine dependence, cigarettes, uncomplicated: Secondary | ICD-10-CM | POA: Insufficient documentation

## 2021-03-24 DIAGNOSIS — R590 Localized enlarged lymph nodes: Secondary | ICD-10-CM | POA: Insufficient documentation

## 2021-03-27 ENCOUNTER — Other Ambulatory Visit: Payer: Self-pay | Admitting: Family Medicine

## 2021-03-27 DIAGNOSIS — E041 Nontoxic single thyroid nodule: Secondary | ICD-10-CM

## 2021-03-27 NOTE — Progress Notes (Signed)
Called patient to discuss incidental nodule discovered on her CT chest. Recommendation is for thyroid ultrasound. She is amenable to this. Order placed.

## 2021-03-29 ENCOUNTER — Telehealth: Payer: Self-pay | Admitting: *Deleted

## 2021-03-29 NOTE — Telephone Encounter (Signed)
Pt informed and scheduled. Shamanda Len, CMA  

## 2021-03-29 NOTE — Telephone Encounter (Signed)
-----   Message from Sharion Settler, DO sent at 03/27/2021  5:17 PM EDT ----- Regarding: Please call to schedule Thyroid US Hi Team!   This patient had an incidental nodule found on her chest CT and is recommended for thyroid ultrasound. I called her to discuss this and she is agreeable to proceed. An order has been placed. She prefers [facility] if possible and due to her other appointments, she states Friday's would be best for her. She may be able to do Tuesdays and/or Wednesdays if no availability on a Friday.   Thanks so much for your help! Dawson Bills

## 2021-03-30 ENCOUNTER — Ambulatory Visit: Payer: Medicaid Other

## 2021-04-03 ENCOUNTER — Ambulatory Visit: Payer: Medicaid Other | Attending: Orthopaedic Surgery

## 2021-04-03 ENCOUNTER — Other Ambulatory Visit: Payer: Self-pay

## 2021-04-03 ENCOUNTER — Other Ambulatory Visit: Payer: Self-pay | Admitting: Family Medicine

## 2021-04-03 DIAGNOSIS — M25661 Stiffness of right knee, not elsewhere classified: Secondary | ICD-10-CM | POA: Insufficient documentation

## 2021-04-03 DIAGNOSIS — M6281 Muscle weakness (generalized): Secondary | ICD-10-CM | POA: Insufficient documentation

## 2021-04-03 DIAGNOSIS — G8929 Other chronic pain: Secondary | ICD-10-CM | POA: Insufficient documentation

## 2021-04-03 DIAGNOSIS — R2681 Unsteadiness on feet: Secondary | ICD-10-CM | POA: Insufficient documentation

## 2021-04-03 DIAGNOSIS — M25561 Pain in right knee: Secondary | ICD-10-CM | POA: Insufficient documentation

## 2021-04-03 NOTE — Therapy (Signed)
[place], [place], [address] Phone: [phone]   Fax:  [phone]  Physical Therapy Treatment/ Discharge Summary  Patient Details  Name: Monica Watson MRN: [id number] Date of Birth: 1955/12/06 Referring Provider (PT): [name]   Encounter Date: 04/03/2021   PT End of Session - 04/03/21 1746     Visit Number 9    Number of Visits 12    Date for PT Re-Evaluation 04/03/21    Authorization Type [place] MCD    Authorization Time Period 9 visits between 02/22/2021 and 04/04/2021    Authorization - Visit Number 4    Authorization - Number of Visits 9    PT Start Time 1025   pt arrived 15 minutes late to her appointment.   PT Stop Time 1745    PT Time Calculation (min) 30 min    Activity Tolerance Patient tolerated treatment well    Behavior During Therapy WFL for tasks assessed/performed             Past Medical History:  Diagnosis Date   Arthritis    Cerebrovascular disease    Depression    Hypertension    Stroke ([place])    Type 2 diabetes mellitus ([place])     Past Surgical History:  Procedure Laterality Date   bilateral knee replacements      bilateral shoulder replacement     I & D KNEE WITH POLY EXCHANGE Right 12/16/2020   Procedure: POLY EXCHANGE RIGHT KNEE;  Surgeon: [name], MD;  Location: [facility];  Service: Orthopedics;  Laterality: Right;   left carpal tunnel release      THYROIDECTOMY      There were no vitals filed for this visit.   Subjective Assessment - 04/03/21 1714     Subjective Pt reports that her R knee is doing okay since starting PT. She reports doing her HEP regularly. She adds that she is ready to be discharged from PT at this time to independently progress her HEP.    How long can you sit comfortably? Unlimited    How long can you stand comfortably? >30 minutes    How long can you walk comfortably? Unlimited    Currently in Pain? No/denies     Pain Score 0-No pain                [facility] PT Assessment - 04/03/21 0001       AROM   Right Knee Flexion 115    Left Knee Extension 0                           [facility] Adult PT Treatment/Exercise - 04/03/21 0001       Transfers   Transfers Floor to Transfer    Floor to Transfer 7: Independent;Other (comment)   Pt demonstrates ability to transition from floor to seat to mimic transferring from tub to tub bench.     Knee/Hip Exercises: Plyometrics   Unilateral Jumping Limitations Side to side jumpting with lateral translation 3x20      Knee/Hip Exercises: Standing   Functional Squat Limitations Squat with explosive overhead reach 3x10                     PT Education - 04/03/21 1745     Education Details Discussed objective and subjective improvements made in PT, as well as ways to progress her HEP.    Person(s)  Educated Patient    Methods Explanation    Comprehension Verbalized understanding              PT Short Term Goals - 02/09/21 1621       PT SHORT TERM GOAL #1   Title Pt will report understanding and adherence to her HEP in order to promote independence in the maintenance of her primary impairments.    Baseline Pt reports adherence to her HEP    Time 4    Period Weeks    Status Achieved    Target Date 02/06/21      PT SHORT TERM GOAL #2   Title -    Baseline -      PT SHORT TERM GOAL #3   Title -    Baseline -      PT SHORT TERM GOAL #4   Title -    Baseline -               PT Long Term Goals - 04/03/21 1750       PT LONG TERM GOAL #1   Title Pt will demonstrate R knee flexion AROM of 125 degrees in order to promote normal gait.    Baseline 108 degrees 03/06/2021, 115 degrees (04/03/2021)    Time 8    Period Weeks    Status Partially Met      PT LONG TERM GOAL #2   Title Pt will report ability to sleep through the night without being wokendue to knee pain.    Baseline Pt sleeps through the night  without being woken by pain.    Time 8    Period Weeks    Status Achieved      PT LONG TERM GOAL #3   Title Pt will demonstrate BIL global hip MMT of 5/5 in order to progress LE strengthening exercises without limitation.    Baseline BIL global hip strength 5/5    Time 8    Period Weeks    Status Achieved      PT LONG TERM GOAL #4   Title Pt will report ability to stand >30 minutes without an AD and with pain no greater than 3/10 in order to wash dishes without limitation.    Baseline Unable to stand >15 minutes without >6/10 pain (03/06/2021); Pt reports being able to stand >30 minutes with 0/10 pain (04/03/2021)    Time 8    Period Weeks    Status Achieved      PT LONG TERM GOAL #5   Title -    Baseline -                   Plan - 04/03/21 1747     Clinical Impression Statement Pt arrived 15 minutes late to her appointment, which led to a truncated treatment session. She responded well to newly added introductory plyometric exercises such as squats with explosive overhead reaches and lateral jumping, stating that she enjoyed the new exercises as she felt like she was moving more. Additionally, due to pt reporting wanting to practice getting into her bath tub, she was instructed on floor to sit transfers, of which the pt demonstrated independence. Upon re-assessment of objective measures and goals, the pt has continued to improved her R knee flexion AROM and functional ability to stand for prolonged periods. Due to meeting nearly all of her rehab goals and the pt reporting feeling ready to be discharged, the pt is discharged from PT at this  time to independently progress her HEP.    PT Next Visit Plan Pt is discharged from PT at this time    PT [place] and Agree with Plan of Care Patient             Patient will benefit from skilled therapeutic intervention in order to improve the following deficits and impairments:     Visit  Diagnosis: Chronic pain of right knee  Stiffness of right knee, not elsewhere classified  Unsteadiness on feet  Muscle weakness (generalized)     Problem List Patient Active Problem List   Diagnosis Date Noted   Cervical lymphadenopathy 03/11/2021   Status post revision of total replacement of right knee 12/16/2020   Polyethylene wear of right knee prosthesis, 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/15/2020   Sialadenitis 11/23/2020   Abdominal pain 10/23/2020   Cervical cancer screening 08/10/2020   Bartholin cyst 07/29/2020   History of total knee replacement, left 06/13/2020   History of total knee arthroplasty, right 06/13/2020   Acute right-sided back pain 06/09/2020   Depression, major, single episode, mild ([place]) 05/12/2020   Vision changes 12/31/2019   Bilateral knee pain 10/08/2019   Poor dentition 10/08/2019   Chronic left shoulder pain 08/31/2019   Hemorrhoids 04/04/2019   History of stroke 01/01/2019   Hypertension associated with diabetes ([place]) 01/01/2019   Pre-diabetes 01/01/2019   Hot flashes 01/01/2019   Onychomycosis 01/01/2019     [facility] [place], [place], [address] Phone: [phone]   Fax:  [phone]  Name: Monica Watson MRN: [id number] Date of Birth: 25-Jul-1955  PHYSICAL THERAPY DISCHARGE SUMMARY  Visits from Start of Care: 9  Current functional level related to goals / functional outcomes: Pt has achieved all of her rehab goals except for R knee flexion AROM; She has greatly improved her R knee flexion AROM, but fell 10 degrees short of the 125 degree goal which was set.   Remaining deficits: Mild limited in R knee flexion AROM   Education / Equipment: HEP   Patient agrees to discharge. Patient goals were met. Patient is being discharged due to meeting the stated rehab goals.  Vanessa [place], PT, DPT 04/03/21 5:55 PM

## 2021-04-03 NOTE — Patient Instructions (Signed)
Pt instructed to independently progress her HEP.

## 2021-04-07 ENCOUNTER — Ambulatory Visit (HOSPITAL_COMMUNITY)
Admission: RE | Admit: 2021-04-07 | Discharge: 2021-04-07 | Disposition: A | Discharge status: Home / Self Care | Payer: Medicaid Other | Source: Ambulatory Visit | Attending: Family Medicine | Admitting: Family Medicine

## 2021-04-07 ENCOUNTER — Other Ambulatory Visit: Payer: Self-pay

## 2021-04-07 DIAGNOSIS — E041 Nontoxic single thyroid nodule: Secondary | ICD-10-CM | POA: Diagnosis not present

## 2021-04-11 ENCOUNTER — Other Ambulatory Visit: Payer: Self-pay | Admitting: Family Medicine

## 2021-04-11 DIAGNOSIS — E041 Nontoxic single thyroid nodule: Secondary | ICD-10-CM

## 2021-04-12 ENCOUNTER — Ambulatory Visit: Payer: Medicaid Other

## 2021-04-13 ENCOUNTER — Telehealth: Payer: Self-pay

## 2021-04-13 NOTE — Telephone Encounter (Signed)
-----   Message from Sharion Settler, DO sent at 04/11/2021  6:19 PM EDT ----- Regarding: Please schedule for U/S for Fine Needle Aspiration of Thyroid Hi Team,  Unfortunately Ms. Ortner has had several abnormal imaging now that show suspicious nodules in the left-side of her thyroid. She has been recommended for fine needle aspiration of two of them. I have placed two orders for ultrasound guided fine needle aspiration, one for each nodule. If I made any mistakes with ordering this, please let me know. Would you be able to schedule this and call her with the time and date? I have already talked with her regarding the results so there should be no surprises.  Thank you very much! Dawson Bills

## 2021-04-13 NOTE — Telephone Encounter (Signed)
Attempted to reach out to patient for scheduling of U/S Fine Needle Aspiration of Thyroid with [facility] ([facility]).  [place] at GI has tried reaching out to patient as well. Can not be scheduled until patient confirms her availability.  Currently waiting to speak to [place] at GI to see if she has made any headway in reaching patient. [place] can be reached at [phone]).  FYI [facility] does not do this procedure.  Ozella Almond, [place]

## 2021-05-03 ENCOUNTER — Ambulatory Visit: Payer: Medicaid Other | Admitting: Orthopaedic Surgery

## 2021-05-11 ENCOUNTER — Ambulatory Visit
Admission: RE | Admit: 2021-05-11 | Discharge: 2021-05-11 | Disposition: A | Discharge status: Home / Self Care | Payer: Medicaid Other | Source: Ambulatory Visit | Attending: Family Medicine | Admitting: Family Medicine

## 2021-05-11 ENCOUNTER — Other Ambulatory Visit (HOSPITAL_COMMUNITY)
Admission: RE | Admit: 2021-05-11 | Discharge: 2021-05-11 | Disposition: A | Discharge status: Home / Self Care | Payer: Medicaid Other | Source: Ambulatory Visit | Attending: Family Medicine | Admitting: Family Medicine

## 2021-05-11 DIAGNOSIS — E041 Nontoxic single thyroid nodule: Secondary | ICD-10-CM | POA: Diagnosis present

## 2021-05-12 LAB — CYTOLOGY - NON PAP

## 2021-05-15 ENCOUNTER — Encounter: Payer: Self-pay | Admitting: Family Medicine

## 2021-06-05 ENCOUNTER — Ambulatory Visit (INDEPENDENT_AMBULATORY_CARE_PROVIDER_SITE_OTHER): Payer: Medicaid Other | Admitting: Podiatry

## 2021-06-05 ENCOUNTER — Other Ambulatory Visit: Payer: Self-pay

## 2021-06-05 ENCOUNTER — Encounter: Payer: Self-pay | Admitting: Podiatry

## 2021-06-05 DIAGNOSIS — M79675 Pain in left toe(s): Secondary | ICD-10-CM

## 2021-06-05 DIAGNOSIS — E1142 Type 2 diabetes mellitus with diabetic polyneuropathy: Secondary | ICD-10-CM

## 2021-06-05 DIAGNOSIS — B353 Tinea pedis: Secondary | ICD-10-CM

## 2021-06-05 DIAGNOSIS — M2141 Flat foot [pes planus] (acquired), right foot: Secondary | ICD-10-CM

## 2021-06-05 DIAGNOSIS — B351 Tinea unguium: Secondary | ICD-10-CM

## 2021-06-05 DIAGNOSIS — M2142 Flat foot [pes planus] (acquired), left foot: Secondary | ICD-10-CM | POA: Diagnosis not present

## 2021-06-05 DIAGNOSIS — M79674 Pain in right toe(s): Secondary | ICD-10-CM | POA: Diagnosis not present

## 2021-06-05 DIAGNOSIS — E119 Type 2 diabetes mellitus without complications: Secondary | ICD-10-CM

## 2021-06-05 MED ORDER — LOTRIMIN AF DEODORANT POWDER 2 % EX AERP: INHALATION_SPRAY | CUTANEOUS | 0 refills | Status: DC

## 2021-06-05 NOTE — Patient Instructions (Signed)
Athlete's Foot Athlete's foot (tinea pedis) is a fungal infection of the skin on your feet. It often occurs on the skin that is between or underneath the toes. It can also occur on the soles of your feet. The infection can spread from person to person (is contagious). It can also spread when a person's bare feet come in contact with the fungus on shower floors or on items such as shoes. What are the causes? This condition is caused by a fungus that grows in warm, moist places. You can get athlete's foot by sharing shoes, shower stalls, towels, and wet floors with someone who is infected. Not washing your feet or changing your socks often enough can also lead to athlete's foot. What increases the risk? This condition is more likely to develop in: Men. People who have a weak body defense system (immune system). People who have diabetes. People who use public showers, such as at a gym. People who wear heavy-duty shoes, such as industrial or military shoes. Seasons with warm, humid weather. What are the signs or symptoms? Symptoms of this condition include: Itchy areas between your toes or on the soles of your feet. White, flaky, or scaly areas between your toes or on the soles of your feet. Very itchy small blisters between your toes or on the soles of your feet. Small cuts in your skin. These cuts can become infected. Thick or discolored toenails. How is this diagnosed? This condition may be diagnosed with a physical exam and a review of your medical history. Your health care provider may also take a skin or toenail sample to examine under a microscope. How is this treated? This condition is treated with antifungal medicines. These may be applied as powders, ointments, or creams. In severe cases, an oral antifungal medicine may be given. Follow these instructions at home: Medicines Apply or take over-the-counter and prescription medicines only as told by your health care provider. Apply your  antifungal medicine as told by your health care provider. Do not stop using the antifungal even if your condition improves. Foot care Do not scratch your feet. Keep your feet dry: Wear cotton or wool socks. Change your socks every day or if they become wet. Wear shoes that allow air to flow, such as sandals or canvas tennis shoes. Wash and dry your feet, including the area between your toes. Also, wash and dry your feet: Every day or as told by your health care provider. After exercising. General instructions Do not let others use towels, shoes, nail clippers, or other personal items that touch your feet. Protect your feet by wearing sandals in wet areas, such as locker rooms and shared showers. Keep all follow-up visits. This is important. If you have diabetes, keep your blood sugar under control. Contact a health care provider if: You have a fever. You have swelling, soreness, warmth, or redness in your foot. Your feet are not getting better with treatment. Your symptoms get worse. You have new symptoms. You have severe pain. Summary Athlete's foot (tinea pedis) is a fungal infection of the skin on your feet. It often occurs on skin that is between or underneath the toes. This condition is caused by a fungus that grows in warm, moist places. Symptoms include white, flaky, or scaly areas between your toes or on the soles of your feet. This condition is treated with antifungal medicines. Keep your feet clean. Always dry them thoroughly. This information is not intended to replace advice given to you by   your health care provider. Make sure you discuss any questions you have with your health care provider. Document Revised: 10/02/2020 Document Reviewed: 10/02/2020 Elsevier Patient Education  2022 Elsevier Inc.    

## 2021-06-05 NOTE — Progress Notes (Signed)
ANNUAL DIABETIC FOOT EXAM  Subjective: Maryella Abood presents today for for annual diabetic foot examination.  Patient relates 6 year h/o diabetes.  Patient denies any h/o foot wounds.  Patient has been diagnosed with neuropathy and it is managed with gabapentin.  Sharion Settler, DO is patient's PCP. Last visit was 11/23/2020.  Past Medical History:  Diagnosis Date   Arthritis    Cerebrovascular disease    Depression    Hypertension    Stroke ([place])    Type 2 diabetes mellitus ([place])    Patient Active Problem List   Diagnosis Date Noted   Cervical lymphadenopathy 03/11/2021   Status post revision of total replacement of right knee 12/16/2020   Polyethylene wear of right knee prosthesis, 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/15/2020   Sialadenitis 11/23/2020   Abdominal pain 10/23/2020   Cervical cancer screening 08/10/2020   Bartholin cyst 07/29/2020   History of total knee replacement, left 06/13/2020   History of total knee arthroplasty, right 06/13/2020   Acute right-sided back pain 06/09/2020   Depression, major, single episode, mild ([place]) 05/12/2020   Vision changes 12/31/2019   Bilateral knee pain 10/08/2019   Poor dentition 10/08/2019   Chronic left shoulder pain 08/31/2019   Hemorrhoids 04/04/2019   History of stroke 01/01/2019   Hypertension associated with diabetes ([place]) 01/01/2019   Pre-diabetes 01/01/2019   Hot flashes 01/01/2019   Onychomycosis 01/01/2019   Past Surgical History:  Procedure Laterality Date   bilateral knee replacements      bilateral shoulder replacement     I & D KNEE WITH POLY EXCHANGE Right 12/16/2020   Procedure: POLY EXCHANGE RIGHT KNEE;  Surgeon: [name], MD;  Location: [facility];  Service: Orthopedics;  Laterality: Right;   left carpal tunnel release      THYROIDECTOMY     Current Outpatient Medications on File Prior to Visit  Medication Sig Dispense Refill   atorvastatin (LIPITOR) 20 MG tablet TAKE 1 TABLET(20 MG) BY  MOUTH DAILY (Patient taking differently: Take 20 mg by mouth daily. TAKE 1 TABLET(20 MG) BY MOUTH DAILY) 60 tablet 0   benzonatate (TESSALON) 100 MG capsule Take 1 capsule (100 mg total) by mouth 3 (three) times daily as needed for cough. 30 capsule 0   cephALEXin (KEFLEX) 500 MG capsule Take 1 capsule (500 mg total) by mouth 4 (four) times daily. 20 capsule 0   ciclopirox (PENLAC) 8 % solution Apply one coat to toenail qd.  Remove weekly with polish remover. 6.6 mL 11   gabapentin (NEURONTIN) 800 MG tablet Take 1 tablet (800 mg total) by mouth at bedtime. 30 tablet 0   Glycopyrronium Tosylate 2.4 % PADS Apply 1 each topically daily as needed (sweating). (Patient not taking: Reported on 01/09/2021) 30 each 0   losartan (COZAAR) 50 MG tablet TAKE 1 TABLET(50 MG) BY MOUTH EVERY DAY FOR BLOOD PRESSURE 90 tablet 3   oxyCODONE-acetaminophen (PERCOCET) 10-325 MG tablet Take 1 tablet by mouth every 6 (six) hours as needed for pain. 30 tablet 0   polyethylene glycol (MIRALAX) 17 g packet Take 17 g by mouth daily. (Patient not taking: Reported on 01/09/2021) 90 each 0   tiZANidine (ZANAFLEX) 4 MG tablet Take 1 tablet (4 mg total) by mouth every 8 (eight) hours as needed for muscle spasms. 30 tablet 0   [DISCONTINUED] clonazePAM (KLONOPIN) 0.5 MG tablet Take 0.5 tablets (0.25 mg total) by mouth 2 (two) times daily. (Patient not taking: No sig reported) 30 tablet 3   [DISCONTINUED] ferrous  sulfate (FEROSUL) 325 (65 FE) MG tablet Take 1 tablet (325 mg total) by mouth daily. (Patient not taking: No sig reported) 30 tablet 3   [DISCONTINUED] metFORMIN (GLUCOPHAGE-XR) 500 MG 24 hr tablet TAKE 2 TABLETS BY MOUTH QD WITH EVENING MEAL FOR DIABETES (Patient not taking: Reported on 12/02/2020) 90 tablet 0   [DISCONTINUED] promethazine (PHENERGAN) 12.5 MG tablet Take 1 tablet (12.5 mg total) by mouth every 8 (eight) hours as needed for nausea or vomiting. (Patient not taking: No sig reported) 30 tablet 0   No current  facility-administered medications on file prior to visit.    No Known Allergies Social History   Occupational History   Not on file  Tobacco Use   Smoking status: Every Day    Packs/day: 0.50    Years: 46.00    Pack years: 23.00    Types: Cigarettes   Smokeless tobacco: Never  Vaping Use   Vaping Use: Never used  Substance and Sexual Activity   Alcohol use: Never   Drug use: Never   Sexual activity: Not on file   History reviewed. No pertinent family history. Immunization History  Administered Date(s) Administered   Influenza,inj,Quad PF,6+ Mos 05/12/2020   PFIZER(Purple Top)SARS-COV-2 Vaccination 12/04/2019, 12/25/2019   Tdap 11/23/2020     Review of Systems: Negative except as noted in the HPI.   Objective: There were no vitals filed for this visit.  Ahja Martello is a pleasant 65 y.o. female in NAD. AAO X 3.  Vascular Examination: CFT immediate b/l LE. Palpable DP/PT pulses b/l LE. Digital hair absent b/l. Skin temperature gradient WNL b/l. No pain with calf compression b/l. No edema noted b/l. No cyanosis or clubbing noted b/l LE.  Dermatological Examination: No open wounds b/l LE. Interdigital maceration noted 3rd and 4th webspace(s). No blistering, no weeping, no open wounds. Toenails 1-5 b/l elongated, discolored, dystrophic, thickened, crumbly with subungual debris and tenderness to dorsal palpation. Porokeratotic lesion(s) plantarlateral aspect right RF and submet head 5 left foot. No erythema, no edema, no drainage, no fluctuance.  Musculoskeletal Examination: Normal muscle strength 5/5 to all lower extremity muscle groups bilaterally. Pes planus deformity noted bilateral LE.Marland Kitchen No pain, crepitus or joint limitation noted with ROM b/l LE.  Patient ambulates independently without assistive aids.  Footwear Assessment: Does the patient wear appropriate shoes? Yes. Does the patient need inserts/orthotics? No.  Neurological Examination: Pt has subjective symptoms  of neuropathy. Protective sensation intact 5/5 intact bilaterally with 10g monofilament b/l. Vibratory sensation intact b/l.  Hemoglobin A1C Latest Ref Rng & Units 12/13/2020 06/09/2020  HGBA1C 4.8 - 5.6 % 6.2(H) 6.4(A)  Some recent data might be hidden   Assessment: 1. Pain due to onychomycosis of toenails of both feet   2. Tinea pedis of both feet   3. Diabetic peripheral neuropathy associated with type 2 diabetes mellitus ([place])   4. Pes planus of both feet   5. Encounter for diabetic foot exam ([place])      ADA Risk Categorization: Low Risk :  Patient has all of the following: Intact protective sensation No prior foot ulcer  No severe deformity Pedal pulses present  Plan: -Medicaid ABN signed for this year. Patient refuses services of porokeratosis on today. Copy has been placed in patient chart. -Diabetic foot examination performed today. -Continue foot and shoe inspections daily. Monitor blood glucose per PCP/Endocrinologist's recommendations. -Patient instructed to spray Miconazole 2% spray powder between toes once daily. -Toenails 1-5 b/l were debrided in length and girth with sterile nail  nippers and dremel without iatrogenic bleeding.  -Patient/POA to call should there be question/concern in the interim.  Return in about 3 months (around 09/03/2021).  Marzetta Board, DPM

## 2021-06-15 NOTE — Patient Instructions (Incomplete)
It was wonderful to see you today.  Please bring ALL of your medications with you to every visit.   Today we talked about:  Today at your annual preventive visit we talked about the following measures: I recommend 150 minutes of exercise per week-try 30 minutes 5 days per week We discussed reducing sugary beverages (like soda and juice) and increasing leafy greens and whole fruits.  We discussed avoiding tobacco and alcohol.  I recommend avoiding illicit substances.  Your blood pressure is *** at goal of ***.   Thank you for choosing [facility].   Please call [phone] with any questions about today's appointment.  Please be sure to schedule follow up at the front  desk before you leave today.   Sharion Settler, DO PGY-2 Family Medicine

## 2021-06-15 NOTE — Progress Notes (Deleted)
° ° °  SUBJECTIVE:   Chief compliant/HPI: annual examination  Monica Watson is a 65 y.o. who presents today for an annual exam.    History tabs reviewed and updated ***.   Review of systems form reviewed and notable for ***.   OBJECTIVE:   There were no vitals taken for this visit. *** General: Awake, alert, oriented, in no acute distress, pleasant and cooperative with examination HEENT: Normocephalic, atraumatic, nares patent, dentition is good, oropharynx without erythema or exudates, TM's clear bilaterally, no thyroid nodules palpated Cardio: RRR without murmur, 2+ radial, DP and PT pulses b/l Respiratory: CTAB without wheezing/rhonchi/rales Abdomen: Soft, non-tender to palpation of all quadrants, non-distended, no rebound/guarding, no organomegaly MSK: Able to move all extremities spontaneously, good muscle strength, no abnormalities Extremities: without edema or cyanosis Neuro: Speech is clear and intact, no focal deficits, no facial asymmetry, follows commands  Psych: Normal mood and affect   ASSESSMENT/PLAN:   No problem-specific Assessment & Plan notes found for this encounter.    Annual Examination  See AVS for age appropriate recommendations  PHQ score ***, reviewed and discussed.  BP reviewed and at goal ***.  Asked about intimate partner violence and resources given as appropriate  Advance directives discussion ***  Considered the following items based upon USPSTF recommendations: Diabetes screening: ordered Screening for elevated cholesterol: ordered HIV testing: ordered for screening  Hepatitis C: ordered for screening  Hepatitis B: discussed and not at high risk Syphilis if at high risk: discussed and not at high risk GC/CT not at high risk and not ordered. Osteoporosis screening considered based upon risk of fracture from [facility] calculator. Major osteoporotic fracture risk is ***%. DEXA {ordered not order:23822}.  Reviewed risk factors for latent  tuberculosis and not indicated  Discussed family history, BRCA testing not indicated.  Cervical cancer screening:  Negative cytology 07/2020.  Breast cancer screening:  due Colorectal cancer screening:  reported colonoscopy in 2020, I am unable to view these records. Lung cancer screening: {discussed/declined/written LGSP:32419}.  Vaccinations ***.   Follow up in 1 year or sooner if indicated.    Sharion Settler, [place]

## 2021-06-16 ENCOUNTER — Encounter: Payer: Medicaid Other | Admitting: Family Medicine

## 2021-06-18 NOTE — Progress Notes (Signed)
° ° °  SUBJECTIVE:   Chief compliant/HPI: annual examination  Monica Watson is a 65 y.o. who presents today for an annual exam.  Current concerns: Arthritis pains. Getting over a cold (still has "sniffles")  History tabs reviewed and updated.    OBJECTIVE:   BP 127/75    Pulse 76    Temp 98.4 F (36.9 C)    Wt 198 lb (89.8 kg)    SpO2 98%    BMI 32.95 kg/m   General: Awake, alert, oriented, in no acute distress, pleasant and cooperative with examination HEENT: Normocephalic, atraumatic, nares patent, dentition is poor, oropharynx without erythema or exudates, TM's clear bilaterally, soft and mobile non-tender mass below angle of left mandible approximately 3x2cm Cardio: RRR without murmur, 2+ radial pulses b/l Respiratory: CTAB without wheezing/rhonchi/rales Abdomen: Soft, non-tender to palpation of all quadrants, non-distended, obese Extremities: without edema or cyanosis Neuro: Speech is clear and intact, no focal deficits, no facial asymmetry, follows commands  Psych: Normal mood and affect  Depression screen [facility] 2/9 06/29/2021 03/10/2021 11/23/2020  Decreased Interest 0 0 0  Down, Depressed, Hopeless _0 PHQ - 2 Score _1 Altered sleeping 0 0 0  Tired, decreased energy 1 1 0  Change in appetite 0 1 1  Feeling bad or failure about yourself  0 0 1  Trouble concentrating 0 1 0  Moving slowly or fidgety/restless 0 1 -  Suicidal thoughts 0 0 0  PHQ-9 Score _2 Difficult doing work/chores - - -  Some recent data might be hidden     ASSESSMENT/PLAN:   Viral URI with cough Symptom onset 1 week ago.  Afebrile today.  Examination unremarkable. -Conservative treatment discussed; no need for antibiotics -Flonase prescribed; could be a component of allergic rhinitis contributing to congestion and cough as well  -Tessalon perles prescribed    Annual Examination  See AVS for age appropriate recommendations  PHQ score 2, reviewed and discussed.  BP reviewed and at goal.   Asked about intimate partner violence and resources given as appropriate  Advance directives discussion: Paperwork provided today.  Considered the following items based upon USPSTF recommendations: Diabetes screening: ordered, 6.3. Discussed diet and exercise recommendations. Screening for elevated cholesterol: ordered HIV testing: ordered for screening  Hepatitis C: ordered for screening  Hepatitis B: discussed and not at high risk Syphilis if at high risk: discussed and not at high risk GC/CT not at high risk and not ordered. Osteoporosis screening considered based upon risk of fracture from [facility] calculator. Major osteoporotic fracture risk is 15%. DEXA ordered.  Reviewed risk factors for latent tuberculosis and not indicated  Discussed family history, BRCA testing not indicated.  Cervical cancer screening:  Negative cytology 07/2020.  Breast cancer screening:  due Colorectal cancer screening:  reported colonoscopy in 2020, I am unable to view these records. Lung cancer screening:  up to date .  Smoking since 65 years old. Smoking 3 cigarettes/day currently, smoked 6/day at most. Next low-dose CT due Sept 2023. Vaccinations influenza today. Would like to come back for PNA and COVID vaccines.   Follow up in 1 year or sooner if indicated.    Sharion Settler, [place]

## 2021-06-29 ENCOUNTER — Ambulatory Visit (INDEPENDENT_AMBULATORY_CARE_PROVIDER_SITE_OTHER): Payer: Medicaid Other | Admitting: Family Medicine

## 2021-06-29 ENCOUNTER — Other Ambulatory Visit: Payer: Self-pay

## 2021-06-29 VITALS — BP 127/75 | HR 76 | Temp 98.4°F | Wt 198.0 lb

## 2021-06-29 DIAGNOSIS — J069 Acute upper respiratory infection, unspecified: Secondary | ICD-10-CM | POA: Insufficient documentation

## 2021-06-29 DIAGNOSIS — Z0000 Encounter for general adult medical examination without abnormal findings: Secondary | ICD-10-CM | POA: Diagnosis not present

## 2021-06-29 DIAGNOSIS — Z13220 Encounter for screening for lipoid disorders: Secondary | ICD-10-CM

## 2021-06-29 DIAGNOSIS — Z13820 Encounter for screening for osteoporosis: Secondary | ICD-10-CM

## 2021-06-29 DIAGNOSIS — Z1159 Encounter for screening for other viral diseases: Secondary | ICD-10-CM

## 2021-06-29 DIAGNOSIS — Z1231 Encounter for screening mammogram for malignant neoplasm of breast: Secondary | ICD-10-CM

## 2021-06-29 DIAGNOSIS — Z114 Encounter for screening for human immunodeficiency virus [HIV]: Secondary | ICD-10-CM | POA: Diagnosis not present

## 2021-06-29 DIAGNOSIS — R7303 Prediabetes: Secondary | ICD-10-CM

## 2021-06-29 DIAGNOSIS — Z23 Encounter for immunization: Secondary | ICD-10-CM | POA: Diagnosis not present

## 2021-06-29 LAB — POCT GLYCOSYLATED HEMOGLOBIN (HGB A1C): HbA1c, POC (controlled diabetic range): 6.3 % (ref 0.0–7.0)

## 2021-06-29 MED ORDER — FLUTICASONE PROPIONATE 50 MCG/ACT NA SUSP: 2.0000 | Freq: Every day | NASAL | 6 refills | Status: DC

## 2021-06-29 MED ORDER — BENZONATATE 100 MG PO CAPS: 200.0000 mg | ORAL_CAPSULE | Freq: Three times a day (TID) | ORAL | 0 refills | Status: DC | PRN

## 2021-06-29 NOTE — Patient Instructions (Addendum)
It was wonderful to see you today.  Please bring ALL of your medications with you to every visit.   Today we talked about:  -We are doing lab work today to check for Hepatitis C, cholesterol, HIV. I will send you a MyChart message if you have MyChart. Otherwise, I will give you a call for abnormal results or send a letter if everything returned back normal. If you don't hear from me in 2 weeks, please call the office.   -I am providing you with paperwork to fill out for your advance directives.  -You received your Flu vaccine today. -You are due for a mammogram and a bone density scan. Please call to schedule. [place] are pre-diabetic. We discussed diet and exercise goals to keep you from progressing to diabetes. -Today at your annual preventive visit we talked about the following measures: I recommend 150 minutes of exercise per week-try 30 minutes 5 days per week We discussed reducing sugary beverages (like soda and juice) and increasing leafy greens and whole fruits.  We discussed avoiding tobacco and alcohol.  I recommend avoiding illicit substances.  Your blood pressure is 127/75 at goal of <130/80.     Thank you for choosing [facility].   Please call [phone] with any questions about today's appointment.  Please be sure to schedule follow up at the front  desk before you leave today.   Sharion Settler, DO PGY-2 Family Medicine

## 2021-06-29 NOTE — Assessment & Plan Note (Signed)
Symptom onset 1 week ago.  Afebrile today.  Examination unremarkable. -Conservative treatment discussed; no need for antibiotics -Flonase prescribed; could be a component of allergic rhinitis contributing to congestion and cough as well  -Tessalon perles prescribed

## 2021-06-30 LAB — LIPID PANEL
Chol/HDL Ratio: 3.5 ratio (ref 0.0–4.4)
Cholesterol, Total: 148 mg/dL (ref 100–199)
HDL: 42 mg/dL (ref >39)
LDL Chol Calc (NIH): 79 mg/dL (ref 0–99)
Triglycerides: 156 mg/dL — ABNORMAL HIGH (ref 0–149)
VLDL Cholesterol Cal: 27 mg/dL (ref 5–40)

## 2021-06-30 LAB — HCV INTERPRETATION

## 2021-06-30 LAB — HIV ANTIBODY (ROUTINE TESTING W REFLEX): HIV Screen 4th Generation wRfx: NONREACTIVE

## 2021-06-30 LAB — HCV AB W REFLEX TO QUANT PCR: HCV Ab: <0.1 s/co ratio (ref 0.0–0.9)

## 2021-07-20 ENCOUNTER — Other Ambulatory Visit: Payer: Self-pay | Admitting: Family Medicine

## 2021-07-20 DIAGNOSIS — Z1231 Encounter for screening mammogram for malignant neoplasm of breast: Secondary | ICD-10-CM

## 2021-07-20 NOTE — Progress Notes (Signed)
° ° °  SUBJECTIVE:   CHIEF COMPLAINT / HPI:   Discuss Hemorrhoids  Monica Watson is a 66 y.o. female who presents to the [facility] clinic today to discuss hemorrhoids. Reports they have been bothersome for the last 3 days.  Notes an intermittent pain, seems to be worse with sitting.  She reports that 1 time last week she did have some blood on the tissue paper.  She does not feel like she has been straining.  She has about 3-4 bowel movements a week.  She does note that her water consumption is good but feels she might need to increase her greens.  States that she has had rubber band ligation in the past.  PERTINENT  PMH / PSH: Non-contributory   OBJECTIVE:   BP 118/82    Pulse 90    Wt 198 lb 12.8 oz (90.2 kg)    SpO2 98%    BMI 33.08 kg/m    General: NAD, pleasant, able to participate in exam Respiratory: Normal effort Rectal: External skin tags around anus, no evidence of thrombosed hemorrhoids. Evidence of non-bleeding internal hemorrhoids on anoscopy  Skin: warm and dry, no rashes noted  Rectal exam and anoscopy chaperoned by Delray Alt, CMA  ASSESSMENT/PLAN:   Internal hemorrhoids Nonbleeding internal hemorrhoids visualized on anoscopy today.  Discussed trial with conservative therapies prior to proceeding with surgical referral.  Provided patient with a list of foods high in fiber as well as Metamucil supplement to take.  Encouraged increasing her water content as well to help with regular and soft stools.  She can continue to take Preparation H as needed for discomfort as well as sitz bath's.  Discouraged long-term use of Preparation H given steroid component and potential thinning of skin. -Follow-up in 2 months, if no improvement can consider surgical referral     Sharion Settler, [place]

## 2021-07-28 ENCOUNTER — Other Ambulatory Visit: Payer: Self-pay

## 2021-07-28 ENCOUNTER — Ambulatory Visit (INDEPENDENT_AMBULATORY_CARE_PROVIDER_SITE_OTHER): Payer: Medicaid Other | Admitting: Family Medicine

## 2021-07-28 ENCOUNTER — Encounter: Payer: Self-pay | Admitting: Family Medicine

## 2021-07-28 DIAGNOSIS — K648 Other hemorrhoids: Secondary | ICD-10-CM | POA: Insufficient documentation

## 2021-07-28 MED ORDER — METAMUCIL 28 % PO PACK: 1.0000 | PACK | Freq: Two times a day (BID) | ORAL | 3 refills | Status: DC

## 2021-07-28 NOTE — Assessment & Plan Note (Signed)
Nonbleeding internal hemorrhoids visualized on anoscopy today.  Discussed trial with conservative therapies prior to proceeding with surgical referral.  Provided patient with a list of foods high in fiber as well as Metamucil supplement to take.  Encouraged increasing her water content as well to help with regular and soft stools.  She can continue to take Preparation H as needed for discomfort as well as sitz bath's.  Discouraged long-term use of Preparation H given steroid component and potential thinning of skin. -Follow-up in 2 months, if no improvement can consider surgical referral

## 2021-07-28 NOTE — Patient Instructions (Addendum)
It was wonderful to see you today.  Please bring ALL of your medications with you to every visit.   Today we talked about:  -You do have some internal hemorrhoids. For now, we will work on helping you have soft and regular stools. This can be done by increasing both your fiber and water intake.  I provided you with a list of foods that are high in fiber. -I have also sent fiber supplement to your pharmacy, it is meant to be still, you can take this twice a day. -You can do sitz baths as needed to help with any discomfort. -You can use preparation H as needed; try to limit the use as long-term steroids can thin your skin. -We can discuss referral for surgical evaluation if you have no relief of symptoms with these more conservative methods   Thank you for choosing [facility].   Please call [phone] with any questions about today's appointment.  Please be sure to schedule follow up at the front  desk before you leave today.   Sharion Settler, DO PGY-2 Family Medicine

## 2021-09-11 ENCOUNTER — Telehealth: Payer: Self-pay | Admitting: Orthopaedic Surgery

## 2021-09-11 NOTE — Telephone Encounter (Signed)
Tried several attempts to call pt to notify we no longer do [facility] transportation. Pt needs to call social services. Pt has medicaid to set transportation ?

## 2021-09-28 ENCOUNTER — Ambulatory Visit (INDEPENDENT_AMBULATORY_CARE_PROVIDER_SITE_OTHER): Payer: Medicaid Other | Admitting: Orthopaedic Surgery

## 2021-09-28 ENCOUNTER — Ambulatory Visit (INDEPENDENT_AMBULATORY_CARE_PROVIDER_SITE_OTHER): Payer: Medicaid Other

## 2021-09-28 DIAGNOSIS — Z96651 Presence of right artificial knee joint: Secondary | ICD-10-CM

## 2021-09-28 NOTE — Progress Notes (Signed)
The patient is a 66 year old female who has a history of both her knees [place] physicians [place] several [place].  They were both press-fit implants.  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  So far the metal components in terms of the femoral and tibial components have been intact and there is no evidence of loosening.  They have grown the bone nicely.  She does feel like she is moving better.  She does have a history of a stroke that affected her left side. ? ?Examination of her right knee today shows no effusion and full range of motion.  It feels ligamentously stable.  Her left knee also feels ligamentously stable with no effusion. ? ?Standing AP and lateral view of the right knee shows a well-seated total knee arthroplasty with no complicating features. ? ?At this point she will continue to work on quad strengthening but follow-up for the knees is as needed.  All questions and concerns were answered and addressed. ?

## 2021-10-03 ENCOUNTER — Other Ambulatory Visit (HOSPITAL_BASED_OUTPATIENT_CLINIC_OR_DEPARTMENT_OTHER): Payer: Self-pay | Admitting: Orthopaedic Surgery

## 2021-10-03 DIAGNOSIS — M25512 Pain in left shoulder: Secondary | ICD-10-CM

## 2021-10-04 ENCOUNTER — Ambulatory Visit (HOSPITAL_BASED_OUTPATIENT_CLINIC_OR_DEPARTMENT_OTHER): Payer: Medicaid Other | Admitting: Orthopaedic Surgery

## 2021-10-05 ENCOUNTER — Ambulatory Visit (HOSPITAL_BASED_OUTPATIENT_CLINIC_OR_DEPARTMENT_OTHER)
Admission: RE | Admit: 2021-10-05 | Discharge: 2021-10-05 | Disposition: A | Discharge status: Home / Self Care | Payer: Medicaid Other | Source: Ambulatory Visit | Attending: Orthopaedic Surgery | Admitting: Orthopaedic Surgery

## 2021-10-05 ENCOUNTER — Ambulatory Visit (INDEPENDENT_AMBULATORY_CARE_PROVIDER_SITE_OTHER): Payer: Medicaid Other | Admitting: Orthopaedic Surgery

## 2021-10-05 DIAGNOSIS — M19012 Primary osteoarthritis, left shoulder: Secondary | ICD-10-CM

## 2021-10-05 DIAGNOSIS — M25511 Pain in right shoulder: Secondary | ICD-10-CM | POA: Diagnosis not present

## 2021-10-05 DIAGNOSIS — M25512 Pain in left shoulder: Secondary | ICD-10-CM

## 2021-10-05 DIAGNOSIS — M19011 Primary osteoarthritis, right shoulder: Secondary | ICD-10-CM | POA: Diagnosis not present

## 2021-10-05 MED ORDER — LIDOCAINE HCL 1 % IJ SOLN
4.0000 mL | INTRAMUSCULAR | Status: AC | PRN
  Administered 2021-10-05: 4 mL

## 2021-10-05 MED ORDER — TRIAMCINOLONE ACETONIDE 40 MG/ML IJ SUSP
80.0000 mg | INTRAMUSCULAR | Status: AC | PRN
  Administered 2021-10-05: 80 mg via INTRA_ARTICULAR

## 2021-10-05 NOTE — Progress Notes (Signed)
? ?                            ? ? ?Chief Complaint: Bilateral shoulder pain ?  ? ? ?History of Present Illness:  ? ? ?Monica Watson is a 66 y.o. female left-hand-dominant female presents with bilateral shoulder pain left worse than right for the last several months.  She states that she does have a history of bilateral debridement arthroscopically in both of her shoulders many years prior.  She does not remember specific dates.  She states that she did get relief of her shoulders following this.  That being said for the last several months her shoulder pain has recurred.  She is having pain with overhead activity and overhead motion.  She has not had any injections or physical therapy.  She is currently disabled from her back and arthritis of her knees.  She did have a history of a stroke which is left her with left-sided weakness.  She is not having difficulty laying directly on the left side ? ? ?Surgical History:   ?Previous bilateral shoulder debridements done many years prior arthroscopically ? ?PMH/PSH/Family History/Social History/Meds/Allergies:   ? ?Past Medical History:  ?Diagnosis Date  ?? Arthritis   ?? Cerebrovascular disease   ?? Depression   ?? Hypertension   ?? Stroke [facility])   ?? Type 2 diabetes mellitus ([place])   ? ?Past Surgical History:  ?Procedure Laterality Date  ?? bilateral knee replacements     ?? bilateral shoulder replacement    ?? I & D KNEE WITH POLY EXCHANGE Right 12/16/2020  ? Procedure: POLY EXCHANGE RIGHT KNEE;  Surgeon: [name], MD;  Location: [facility];  Service: Orthopedics;  Laterality: Right;  ?? left carpal tunnel release     ?? THYROIDECTOMY    ? ?Social History  ? ?Socioeconomic History  ?? Marital status: Single  ?  Spouse name: Not on file  ?? Number of children: Not on file  ?? Years of education: Not on file  ?? Highest education level: Not on file  ?Occupational History  ?? Not on file  ?Tobacco Use  ?? Smoking status: Every Day  ?  Packs/day: 0.50  ?  Years:  46.00  ?  Pack years: 23.00  ?  Types: Cigarettes  ?? Smokeless tobacco: Never  ?Vaping Use  ?? Vaping Use: Never used  ?Substance and Sexual Activity  ?? Alcohol use: Never  ?? Drug use: Never  ?? Sexual activity: Not on file  ?Other Topics Concern  ?? Not on file  ?Social History Narrative  ? Retired   ? Right handed  ? ?Social Determinants of Health  ? ?Financial Resource Strain: Not on file  ?Food Insecurity: Not on file  ?Transportation Needs: No Transportation Needs  ?? Lack of Transportation (Medical): No  ?? Lack of Transportation (Non-Medical): No  ?Physical Activity: Not on file  ?Stress: Not on file  ?Social Connections: Not on file  ? ?No family history on file. ?No Known Allergies ?Current Outpatient Medications  ?Medication Sig Dispense Refill  ?? atorvastatin (LIPITOR) 20 MG tablet TAKE 1 TABLET(20 MG) BY MOUTH DAILY (Patient not taking: Reported on 07/28/2021) 60 tablet 0  ?? ciclopirox (PENLAC) 8 % solution Apply one coat to toenail qd.  Remove weekly with polish remover. 6.6 mL 11  ?? fluticasone (FLONASE) 50 MCG/ACT nasal spray Place 2 sprays into both nostrils daily. (Patient not taking: Reported on 07/28/2021) 16  g 6  ?? gabapentin (NEURONTIN) 800 MG tablet Take 1 tablet (800 mg total) by mouth at bedtime. 30 tablet 0  ?? Glycopyrronium Tosylate 2.4 % PADS Apply 1 each topically daily as needed (sweating). (Patient not taking: Reported on 01/09/2021) 30 each 0  ?? losartan (COZAAR) 50 MG tablet TAKE 1 TABLET(50 MG) BY MOUTH EVERY DAY FOR BLOOD PRESSURE 90 tablet 3  ?? Miconazole Nitrate (LOTRIMIN AF DEODORANT POWDER) 2 % AERP Spray between toes once daily. 128 g 0  ?? oxyCODONE-acetaminophen (PERCOCET) 10-325 MG tablet Take 1 tablet by mouth every 6 (six) hours as needed for pain. 30 tablet 0  ?? polyethylene glycol (MIRALAX) 17 g packet Take 17 g by mouth daily. 90 each 0  ?? psyllium (METAMUCIL) 28 % packet Take 1 packet by mouth 2 (two) times daily. 30 each 3  ?? tiZANidine (ZANAFLEX) 4 MG tablet  Take 1 tablet (4 mg total) by mouth every 8 (eight) hours as needed for muscle spasms. (Patient not taking: Reported on 07/28/2021) 30 tablet 0  ? ?No current facility-administered medications for this visit.  ? ?No results found. ? ?Review of Systems:   ?A ROS was performed including pertinent positives and negatives as documented in the HPI. ? ?Physical Exam :   ?Constitutional: NAD and appears stated age ?Neurological: Alert and oriented ?Psych: Appropriate affect and cooperative ?There were no vitals taken for this visit.  ? ?Comprehensive Musculoskeletal Exam:   ? ?Musculoskeletal Exam    ?Inspection Right Left  ?Skin No atrophy or winging No atrophy or winging  ?Palpation    ?Tenderness Glenohumeral Glenohumeral  ?Range of Motion    ?Flexion (passive) 150 150  ?Flexion (active) 150 150  ?Abduction 150 150  ?ER at the side 70 70  ?Can reach behind back to T12 T12  ?Strength    ? Full with pain Full with pain  ?Special Tests    ?Pseudoparalytic No No  ?Neurologic    ?Fires PIN, radial, median, ulnar, musculocutaneous, axillary, suprascapular, long thoracic, and spinal accessory innervated muscles. No abnormal sensibility  ?Vascular/Lymphatic    ?Radial Pulse 2+ 2+  ?Cervical Exam    ?Patient has symmetric cervical range of motion with negative Spurling's test.  ?Special Test: Positive crepitus bilaterally  ? ? ? ?Imaging:   ?Xray (3 views left shoulder): ?There is glenohumeral moderate osteoarthritis ? ? ?I personally reviewed and interpreted the radiographs. ? ? ?Assessment:   ?66 y.o. 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  At today's visit I discussed treatment options including nonoperative versus operative treatment.  She has not had an injection in many years and as result I have offered her this to hopefully get her some type of pain relief.  She would like to proceed with this.  Today we we will plan to perform bilateral  ultrasound-guided glenohumeral injections after verbal consent obtained ? ?Plan :   ? ?-Bilateral ultrasound-guided shoulder injections performed after verbal consent ? ? ? ?Procedure Note ? ?Patient: Monica Watson             ?Date of Birth: Jan 09, 1956           ?MRN: [id number]             ?Visit Date: 10/05/2021 ? ?Procedures: ?Visit Diagnoses: No diagnosis found. ? ?Large Joint Inj: L glenohumeral on 10/05/2021 3:36 PM ?Indications: pain ?Details: 22 G 1.5 in needle, ultrasound-guided anterior approach ? ?Arthrogram: No ? ?Medications: 4 mL lidocaine  1 %; 80 mg triamcinolone acetonide 40 MG/ML ?Outcome: tolerated well, no immediate complications ?Procedure, treatment alternatives, risks and benefits explained, specific risks discussed. Consent was given by the patient. Immediately prior to procedure a time out was called to verify the correct patient, procedure, equipment, support staff and site/side marked as required. Patient was prepped and draped in the usual sterile fashion.  ? ? ?Large Joint Inj: R glenohumeral on 10/05/2021 3:36 PM ?Indications: pain ?Details: 22 G 1.5 in needle, ultrasound-guided anterior approach ? ?Arthrogram: No ? ?Medications: 4 mL lidocaine 1 %; 80 mg triamcinolone acetonide 40 MG/ML ?Outcome: tolerated well, no immediate complications ?Procedure, treatment alternatives, risks and benefits explained, specific risks discussed. Consent was given by the patient. Immediately prior to procedure a time out was called to verify the correct patient, procedure, equipment, support staff and site/side marked as required. Patient was prepped and draped in the usual sterile fashion.  ? ? ? ? ? ? ? ? ?I personally saw and evaluated the patient, and participated in the management and treatment plan. ? ?Vanetta Mulders, MD ?Attending Physician, Orthopedic Surgery ? ?This document was dictated using Systems analyst. A reasonable attempt at proof reading has been made to minimize  errors. ?

## 2021-11-08 LAB — HM DIABETES EYE EXAM

## 2021-11-28 ENCOUNTER — Encounter: Payer: Self-pay | Admitting: *Deleted

## 2022-01-11 ENCOUNTER — Encounter: Payer: Self-pay | Admitting: Family Medicine

## 2022-01-11 ENCOUNTER — Ambulatory Visit (INDEPENDENT_AMBULATORY_CARE_PROVIDER_SITE_OTHER): Payer: Medicaid Other | Admitting: Family Medicine

## 2022-01-11 VITALS — BP 123/83 | HR 71 | Ht 65.0 in | Wt 193.4 lb

## 2022-01-11 DIAGNOSIS — K648 Other hemorrhoids: Secondary | ICD-10-CM | POA: Diagnosis present

## 2022-01-11 MED ORDER — METAMUCIL 28 % PO PACK: 1.0000 | PACK | Freq: Two times a day (BID) | ORAL | 1 refills | Status: AC

## 2022-01-11 NOTE — Patient Instructions (Addendum)
It was great to meet you!  You have two different types of hemorrhoids-- internal and external.  For the external hemorrhoids: Continue using the Preparation H ointment Continue using Miralax daily to help keep your stool soft Try to eat high fiber foods Start taking the metamucil daily (there is a prescription at your pharmacy to pick up).  For the internal hemorrhoids: I have placed a referral to general surgery to discuss additional treatment options. They will call you to arrange an apppointment.  Take care, Dr Rock Nephew

## 2022-01-11 NOTE — Progress Notes (Signed)
    SUBJECTIVE:   CHIEF COMPLAINT / HPI:   Hemorrhoids -Rectal pain started Tuesday (2 days ago) -Thinks it's her hemorrhoids acting up -No rectal bleeding recently -Pain is worse with sitting and with bowel movements -Using preparation H which she thinks helps slightly -Trying to eat more fiber -Not using metamucil that was previously prescribed -Sometimes strains with bowel movements -Known h/o hemorrhoids, both internal and external -States she's seen GI and had colonoscopy within the past few years (records not available in our system) -Also had band ligation at some point in the past  PERTINENT  PMH / PSH: h/o CVA, HTN, osteoarthritis  OBJECTIVE:   BP 123/83   Pulse 71   Ht '5\' 5"'$  (1.651 m)   Wt 193 lb 6.4 oz (87.7 kg)   SpO2 98%   BMI 32.18 kg/m   General: NAD, pleasant, able to participate in exam Respiratory: No respiratory distress Skin: warm and dry, no rashes noted Psych: Normal affect and mood Neuro: grossly intact Rectal: few external hemorrhoids noted, non-thrombosed. Non-bleeding internal hemorrhoids visualized on anoscopy.  ASSESSMENT/PLAN:   Internal hemorrhoids Non-bleeding internal hemorrhoids seen on anoscopy today. Known history of the same. Given that patient continues to be symptomatic, will refer to gen surg for evaluation for rubber band ligation. Continue preparation H, Miralax, fiber intake in the meantime. New Rx sent for metamucil.    Alcus Dad, MD [place]

## 2022-01-12 NOTE — Assessment & Plan Note (Signed)
Non-bleeding internal hemorrhoids seen on anoscopy today. Known history of the same. Given that patient continues to be symptomatic, will refer to gen surg for evaluation for rubber band ligation. Continue preparation H, Miralax, fiber intake in the meantime. New Rx sent for metamucil.

## 2022-01-15 ENCOUNTER — Telehealth: Payer: Self-pay | Admitting: Family Medicine

## 2022-01-15 NOTE — Telephone Encounter (Signed)
D/w patient regarding [facility] forms I received. She does not qualify as she is able to do all ADL's independently. She mentioned that she had some shaking in her legs- she received a prescription of klonopin from a previous doctor that apparently helped with this. She was thinking she may need some more klonopin since she is out. She also mentioned how getting out of the shower can be difficult at times because of her arthritis. We discussed how she may benefit from [facility]. This would require a face to face visit to further discuss and evaluate. She is scheduled to see me 8/28 for further discussion and evaluation.

## 2022-01-16 ENCOUNTER — Other Ambulatory Visit: Payer: Self-pay | Admitting: Family Medicine

## 2022-01-18 ENCOUNTER — Ambulatory Visit (INDEPENDENT_AMBULATORY_CARE_PROVIDER_SITE_OTHER): Payer: Medicaid Other | Admitting: Orthopaedic Surgery

## 2022-01-18 DIAGNOSIS — M19011 Primary osteoarthritis, right shoulder: Secondary | ICD-10-CM | POA: Diagnosis not present

## 2022-01-18 DIAGNOSIS — M25511 Pain in right shoulder: Secondary | ICD-10-CM

## 2022-01-18 DIAGNOSIS — M19012 Primary osteoarthritis, left shoulder: Secondary | ICD-10-CM | POA: Diagnosis not present

## 2022-01-18 DIAGNOSIS — M25512 Pain in left shoulder: Secondary | ICD-10-CM | POA: Diagnosis not present

## 2022-01-18 MED ORDER — TRIAMCINOLONE ACETONIDE 40 MG/ML IJ SUSP
80.0000 mg | INTRAMUSCULAR | Status: AC | PRN
  Administered 2022-01-18: 80 mg via INTRA_ARTICULAR

## 2022-01-18 MED ORDER — LIDOCAINE HCL 1 % IJ SOLN
4.0000 mL | INTRAMUSCULAR | Status: AC | PRN
  Administered 2022-01-18: 4 mL

## 2022-01-18 NOTE — Progress Notes (Signed)
Chief Complaint: Bilateral shoulder pain     History of Present Illness:   01/18/2022: Presents today for follow-up of left shoulder.  She did state that she had approximately 3 months of relief from the left shoulder.  This is subsequently recurred.  She continues to have pain with any type of overhead activity.  She is having difficulty doing basic activities like going to the store as result of this.   Monica Watson is a 66 y.o. female left-hand-dominant female presents with bilateral shoulder pain left worse than right for the last several months.  She states that she does have a history of bilateral debridement arthroscopically in both of her shoulders many years prior.  She does not remember specific dates.  She states that she did get relief of her shoulders following this.  That being said for the last several months her shoulder pain has recurred.  She is having pain with overhead activity and overhead motion.  She has not had any injections or physical therapy.  She is currently disabled from her back and arthritis of her knees.  She did have a history of a stroke which is left her with left-sided weakness.  She is not having difficulty laying directly on the left side   Surgical History:   Previous bilateral shoulder debridements done many years prior arthroscopically  PMH/PSH/Family History/Social History/Meds/Allergies:    Past Medical History:  Diagnosis Date   Arthritis    Cerebrovascular disease    Depression    Hypertension    Stroke ([place])    Type 2 diabetes mellitus ([place])    Past Surgical History:  Procedure Laterality Date   bilateral knee replacements      bilateral shoulder replacement     I & D KNEE WITH POLY EXCHANGE Right 12/16/2020   Procedure: POLY EXCHANGE RIGHT KNEE;  Surgeon: [name], MD;  Location: [facility];  Service: Orthopedics;  Laterality: Right;   left carpal tunnel release      THYROIDECTOMY      Social History   Socioeconomic History   Marital status: Single    Spouse name: Not on file   Number of children: Not on file   Years of education: Not on file   Highest education level: Not on file  Occupational History   Not on file  Tobacco Use   Smoking status: Every Day    Packs/day: 0.50    Years: 46.00    Total pack years: 23.00    Types: Cigarettes   Smokeless tobacco: Never  Vaping Use   Vaping Use: Never used  Substance and Sexual Activity   Alcohol use: Never   Drug use: Never   Sexual activity: Not on file  Other Topics Concern   Not on file  Social History Narrative   Retired    Right handed   Social Determinants of Health   Financial Resource Strain: Not on file  Food Insecurity: Not on file  Transportation Needs: No Transportation Needs (09/22/2021)   PRAPARE - Hydrologist (Medical): No    Lack of Transportation (Non-Medical): No  Physical Activity: Not on file  Stress: Not on file  Social Connections: Not on file   No family history on file. No Known Allergies Current Outpatient Medications  Medication Sig Dispense  Refill   atorvastatin (LIPITOR) 20 MG tablet TAKE 1 TABLET(20 MG) BY MOUTH DAILY (Patient not taking: Reported on 07/28/2021) 60 tablet 0   ciclopirox (PENLAC) 8 % solution Apply one coat to toenail qd.  Remove weekly with polish remover. 6.6 mL 11   fluticasone (FLONASE) 50 MCG/ACT nasal spray Place 2 sprays into both nostrils daily. (Patient not taking: Reported on 07/28/2021) 16 g 6   gabapentin (NEURONTIN) 800 MG tablet Take 1 tablet (800 mg total) by mouth at bedtime. 30 tablet 0   Glycopyrronium Tosylate 2.4 % PADS Apply 1 each topically daily as needed (sweating). (Patient not taking: Reported on 01/09/2021) 30 each 0   losartan (COZAAR) 50 MG tablet TAKE 1 TABLET(50 MG) BY MOUTH EVERY DAY FOR BLOOD PRESSURE 30 tablet 1   Miconazole Nitrate (LOTRIMIN AF DEODORANT POWDER) 2 % AERP Spray between toes once  daily. 128 g 0   oxyCODONE-acetaminophen (PERCOCET) 10-325 MG tablet Take 1 tablet by mouth every 6 (six) hours as needed for pain. 30 tablet 0   polyethylene glycol (MIRALAX) 17 g packet Take 17 g by mouth daily. 90 each 0   psyllium (METAMUCIL) 28 % packet Take 1 packet by mouth 2 (two) times daily. 30 each 1   tiZANidine (ZANAFLEX) 4 MG tablet Take 1 tablet (4 mg total) by mouth every 8 (eight) hours as needed for muscle spasms. (Patient not taking: Reported on 07/28/2021) 30 tablet 0   No current facility-administered medications for this visit.   No results found.  Review of Systems:   A ROS was performed including pertinent positives and negatives as documented in the HPI.  Physical Exam :   Constitutional: NAD and appears stated age Neurological: Alert and oriented Psych: Appropriate affect and cooperative There were no vitals taken for this visit.   Comprehensive Musculoskeletal Exam:    Musculoskeletal Exam    Inspection Right Left  Skin No atrophy or winging No atrophy or winging  Palpation    Tenderness Glenohumeral Glenohumeral  Range of Motion    Flexion (passive) 150 150  Flexion (active) 150 150  Abduction 150 150  ER at the side 70 70  Can reach behind back to T12 T12  Strength     Full with pain Full with pain  Special Tests    Pseudoparalytic No No  Neurologic    Fires PIN, radial, median, ulnar, musculocutaneous, axillary, suprascapular, long thoracic, and spinal accessory innervated muscles. No abnormal sensibility  Vascular/Lymphatic    Radial Pulse 2+ 2+  Cervical Exam    Patient has symmetric cervical range of motion with negative Spurling's test.  Special Test: Positive crepitus bilaterally     Imaging:   Xray (3 views left shoulder): There is glenohumeral moderate osteoarthritis   I personally reviewed and interpreted the radiographs.   Assessment:   66 y.o. female left-hand-dominant female with likely bilateral glenohumeral osteoarthritis  which is moderate in nature.  With regard to the left shoulder she did have an injection which was significantly helpful for 2 months.  At this time she would like to perform another 1.  We will plan for left shoulder ultrasound-guided injection  Plan :    -Left glenohumeral ultrasound-guided injection performed after verbal consent    Procedure Note  Patient: Monica Watson             Date of Birth: 1956-03-14           MRN: [id number]  Visit Date: 01/18/2022  Procedures: Visit Diagnoses: No diagnosis found.  Large Joint Inj: L glenohumeral on 01/18/2022 12:24 PM Indications: pain Details: 22 G 1.5 in needle, ultrasound-guided anterior approach  Arthrogram: No  Medications: 4 mL lidocaine 1 %; 80 mg triamcinolone acetonide 40 MG/ML Outcome: tolerated well, no immediate complications Procedure, treatment alternatives, risks and benefits explained, specific risks discussed. Consent was given by the patient. Immediately prior to procedure a time out was called to verify the correct patient, procedure, equipment, support staff and site/side marked as required. Patient was prepped and draped in the usual sterile fashion.          I personally saw and evaluated the patient, and participated in the management and treatment plan.  Vanetta Mulders, MD Attending Physician, Orthopedic Surgery  This document was dictated using Dragon voice recognition software. A reasonable attempt at proof reading has been made to minimize errors.

## 2022-01-23 ENCOUNTER — Telehealth: Payer: Self-pay | Admitting: Family Medicine

## 2022-01-23 ENCOUNTER — Encounter: Payer: Self-pay | Admitting: Family Medicine

## 2022-01-23 NOTE — Telephone Encounter (Signed)
Called patient and provided her with [facility] contact information via MyChart to schedule an appointment.

## 2022-01-23 NOTE — Telephone Encounter (Signed)
Patient is calling and would like for Dr. Valrie Hart to call her. She was seen 07/20 for hemorrhoids and is still having issues. She would like to discuss options. I informed patient of her upcoming appointment.   The best call back is [phone]

## 2022-01-29 ENCOUNTER — Telehealth: Payer: Self-pay | Admitting: Orthopaedic Surgery

## 2022-01-29 NOTE — Telephone Encounter (Signed)
Patient request a call to discuss should surgery. She states the injections did not help and would like to proceed with surgery

## 2022-02-07 ENCOUNTER — Ambulatory Visit (INDEPENDENT_AMBULATORY_CARE_PROVIDER_SITE_OTHER): Payer: Medicaid Other | Admitting: Orthopaedic Surgery

## 2022-02-07 DIAGNOSIS — M19012 Primary osteoarthritis, left shoulder: Secondary | ICD-10-CM

## 2022-02-07 DIAGNOSIS — M19011 Primary osteoarthritis, right shoulder: Secondary | ICD-10-CM

## 2022-02-07 NOTE — Progress Notes (Signed)
    Chief Complaint: Bilateral shoulder pain        History of Present Illness:    02/07/2022: Presents today for follow-up of her left shoulder.  Unfortunately she did not get any type of relief from her injection.  She is continue to have significant pain with laying directly on the side.  She has pain with any overhead activity which is dramatically limiting very basic activities of daily living.  She is here today for further assessment.     Monica Watson is a 65 y.o. female left-hand-dominant female presents with bilateral shoulder pain left worse than right for the last several months.  She states that she does have a history of bilateral debridement arthroscopically in both of her shoulders many years prior.  She does not remember specific dates.  She states that she did get relief of her shoulders following this.  That being said for the last several months her shoulder pain has recurred.  She is having pain with overhead activity and overhead motion.  She has not had any injections or physical therapy.  She is currently disabled from her back and arthritis of her knees.  She did have a history of a stroke which is left her with left-sided weakness.  She is not having difficulty laying directly on the left side     Surgical History:   Previous bilateral shoulder debridements done many years prior arthroscopically   PMH/PSH/Family History/Social History/Meds/Allergies:         Past Medical History:  Diagnosis Date   Arthritis     Cerebrovascular disease     Depression     Hypertension     Stroke (HCC)     Type 2 diabetes mellitus (HCC)           Past Surgical History:  Procedure Laterality Date   bilateral knee replacements        bilateral shoulder replacement       I & D KNEE WITH POLY EXCHANGE Right 12/16/2020    Procedure: POLY EXCHANGE RIGHT KNEE;  Surgeon: Blackman, Christopher Y, MD;  Location: [facility];  Service: Orthopedics;  Laterality: Right;   left  carpal tunnel release        THYROIDECTOMY        Social History         Socioeconomic History   Marital status: Single      Spouse name: Not on file   Number of children: Not on file   Years of education: Not on file   Highest education level: Not on file  Occupational History   Not on file  Tobacco Use   Smoking status: Every Day      Packs/day: 0.50      Years: 46.00      Total pack years: 23.00      Types: Cigarettes   Smokeless tobacco: Never  Vaping Use   Vaping Use: Never used  Substance and Sexual Activity   Alcohol use: Never   Drug use: Never   Sexual activity: Not on file  Other Topics Concern   Not on file  Social History Narrative    Retired     Right handed    Social Determinants of Health        Financial Resource Strain: Not on file  Food Insecurity: Not on file  Transportation Needs: No Transportation Needs (09/22/2021)    PRAPARE - Transportation     Lack of Transportation (Medical): No       Lack of Transportation (Non-Medical): No  Physical Activity: Not on file  Stress: Not on file  Social Connections: Not on file    No family history on file. No Known Allergies       Current Outpatient Medications  Medication Sig Dispense Refill   atorvastatin (LIPITOR) 20 MG tablet TAKE 1 TABLET(20 MG) BY MOUTH DAILY (Patient not taking: Reported on 07/28/2021) 60 tablet 0   ciclopirox (PENLAC) 8 % solution Apply one coat to toenail qd.  Remove weekly with polish remover. 6.6 mL 11   fluticasone (FLONASE) 50 MCG/ACT nasal spray Place 2 sprays into both nostrils daily. (Patient not taking: Reported on 07/28/2021) 16 g 6   gabapentin (NEURONTIN) 800 MG tablet Take 1 tablet (800 mg total) by mouth at bedtime. 30 tablet 0   Glycopyrronium Tosylate 2.4 % PADS Apply 1 each topically daily as needed (sweating). (Patient not taking: Reported on 01/09/2021) 30 each 0   losartan (COZAAR) 50 MG tablet TAKE 1 TABLET(50 MG) BY MOUTH EVERY DAY FOR BLOOD PRESSURE 30 tablet 1    Miconazole Nitrate (LOTRIMIN AF DEODORANT POWDER) 2 % AERP Spray between toes once daily. 128 g 0   oxyCODONE-acetaminophen (PERCOCET) 10-325 MG tablet Take 1 tablet by mouth every 6 (six) hours as needed for pain. 30 tablet 0   polyethylene glycol (MIRALAX) 17 g packet Take 17 g by mouth daily. 90 each 0   psyllium (METAMUCIL) 28 % packet Take 1 packet by mouth 2 (two) times daily. 30 each 1   tiZANidine (ZANAFLEX) 4 MG tablet Take 1 tablet (4 mg total) by mouth every 8 (eight) hours as needed for muscle spasms. (Patient not taking: Reported on 07/28/2021) 30 tablet 0    No current facility-administered medications for this visit.    No results found.   Review of Systems:   A ROS was performed including pertinent positives and negatives as documented in the HPI.   Physical Exam :   Constitutional: NAD and appears stated age Neurological: Alert and oriented Psych: Appropriate affect and cooperative There were no vitals taken for this visit.    Comprehensive Musculoskeletal Exam:     Musculoskeletal Exam      Inspection Right Left  Skin No atrophy or winging No atrophy or winging  Palpation      Tenderness Glenohumeral Glenohumeral  Range of Motion      Flexion (passive) 150 130  Flexion (active) 150 130  Abduction 150 90  ER at the side 70 25  Can reach behind back to T12 L3  Strength        Full with pain Full with pain  Special Tests      Pseudoparalytic No No  Neurologic      Fires PIN, radial, median, ulnar, musculocutaneous, axillary, suprascapular, long thoracic, and spinal accessory innervated muscles. No abnormal sensibility  Vascular/Lymphatic      Radial Pulse 2+ 2+  Cervical Exam      Patient has symmetric cervical range of motion with negative Spurling's test.  Special Test: Positive crepitus bilaterally        Imaging:   Xray (3 views left shoulder): There is glenohumeral moderate osteoarthritis     I personally reviewed and interpreted the radiographs.      Assessment:   65 y.o. 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  Unfortunately she not get any type of relief from her previous injection of the left shoulder.  To that effect I do not believe that physical   therapy would help in this I believe that this would irritate her arthritis.  We did discuss the role for surgical intervention given this.  At this time I do believe that she would be a candidate for reverse shoulder arthroplasty.  We did discuss the specific limitations and complications that can come with this.  She is hoping to proceed with this as she has now had 3 injections without any relief.  She will need to wait 3 months following of July when she had her last injection prior to surgery in order to minimize the risk of infection. Plan :     -Plan for left shoulder reverse shoulder arthroplasty     After a lengthy discussion of treatment options, including risks, benefits, alternatives, complications of surgical and nonsurgical conservative options, the patient elected surgical repair.    The patient  is aware of the material risks  and complications including, but not limited to injury to adjacent structures, neurovascular injury, infection, numbness, bleeding, implant failure, thermal burns, stiffness, persistent pain, failure to heal, disease transmission from allograft, need for further surgery, dislocation, anesthetic risks, blood clots, risks of death,and others. The probabilities of surgical success and failure discussed with patient given their particular co-morbidities.The time and nature of expected rehabilitation and recovery was discussed.The patient's questions were all answered preoperatively.  No barriers to understanding were noted. I explained the natural history of the disease process and Rx rationale.  I explained to the patient what I considered to be reasonable expectations given their personal situation.  The final treatment  plan was arrived at through a shared patient decision making process model.         I personally saw and evaluated the patient, and participated in the management and treatment plan.   Blessing Ozga, MD Attending Physician, Orthopedic Surgery   This document was dictated using Dragon voice recognition software. A reasonable attempt at proof reading has been made to minimize errors. 

## 2022-02-08 ENCOUNTER — Other Ambulatory Visit: Payer: Self-pay

## 2022-02-08 ENCOUNTER — Encounter: Payer: Self-pay | Admitting: Student

## 2022-02-08 ENCOUNTER — Ambulatory Visit (INDEPENDENT_AMBULATORY_CARE_PROVIDER_SITE_OTHER): Payer: Medicaid Other | Admitting: Student

## 2022-02-08 VITALS — BP 137/78 | HR 82 | Wt 186.8 lb

## 2022-02-08 DIAGNOSIS — N939 Abnormal uterine and vaginal bleeding, unspecified: Secondary | ICD-10-CM | POA: Diagnosis not present

## 2022-02-08 DIAGNOSIS — K648 Other hemorrhoids: Secondary | ICD-10-CM | POA: Diagnosis not present

## 2022-02-08 NOTE — Progress Notes (Signed)
  SUBJECTIVE:   CHIEF COMPLAINT / HPI:   Issues with Hemorid and vaginal bleeding Was seen a couple weeks ago for bleeding coming from internal hemorrohoid. Also having bleeding from vagina.   Vaginal bleeding started 3-4 days ago. Noticing it on tissue when she wipes. Hx of GI bleed from Asprin use. Became menopausal around 66 yo. Patient reports hot flashes.   Rectal bleeding started last week, seeing spots in the toilet and on the tissue. No blood in tub after BM, no uncomfortable stools. Seen in clinic and recommended she see Gen Surg  Denies any dizziness or light headedness or SOB, but complains of feeling cold and tired.   PERTINENT  PMH / PSH: internal/external hemorrhoids   OBJECTIVE:  BP 137/78   Pulse 82   Wt 186 lb 12.8 oz (84.7 kg)   SpO2 99%   BMI 31.09 kg/m   General: NAD, pleasant, able to participate in exam Cardiac: RRR, no murmurs auscultated. Respiratory: CTAB, normal effort, no wheezes, rales or rhonchi Extremities: warm and well perfused, no edema or cyanosis. Physical Exam Exam conducted with a chaperone present.  Genitourinary:    General: Normal vulva.     Comments: Vulva normal in appearance, no rash, swelling or lesion. Cervix pink and normal with yellow discharge from oz. No blood noted in vaginal vault.     ASSESSMENT/PLAN:  Abnormal uterine bleeding (AUB) Patient presents with 3-4 days of vaginal bleeding, noted when she whipes after using the bathroom. Patient began menopause at age 66. Patient denies any SOB or dizziness, but reports fatigue and feeling cold. Reason for AUB could be malignancy, or fibroids, patient will need endometrial biopsy and transvaginal ultrasound. Patient encouraged to schedule appointment for colpo clinic.  -CBC, TSH -Endometrial biopsy -Transvaginal ultrasound  Internal hemorrhoids Patient returns with continued complaint of bleeding from internal hemorrhoids. Patient denies any painful BM's or significant blood in  toilet bowl. Patient had referral to Gen Surg placed, but has not yet heard from office/had appointment scheduled. Gave patient contact information and encouraged patient to reach our for scheduling.  -F/u gen surg referral/make appointment.    Orders Placed This Encounter  Procedures   US Pelvic Complete With Transvaginal    Standing Status:   Future    Standing Expiration Date:   02/09/2023    Order Specific Question:   Reason for Exam (SYMPTOM  OR DIAGNOSIS REQUIRED)    Answer:   AUB    Order Specific Question:   Preferred imaging location?    Answer:   [place]   CBC   TSH Rfx on Abnormal to Free T4   No orders of the defined types were placed in this encounter.  No follow-ups on file. '@SIGNNOTE'$ @

## 2022-02-08 NOTE — Assessment & Plan Note (Signed)
Patient returns with continued complaint of bleeding from internal hemorrhoids. Patient denies any painful BM's or significant blood in toilet bowl. Patient had referral to Gen Surg placed, but has not yet heard from office/had appointment scheduled. Gave patient contact information and encouraged patient to reach our for scheduling.  -F/u gen surg referral/make appointment.

## 2022-02-08 NOTE — Assessment & Plan Note (Addendum)
Patient presents with 3-4 days of vaginal bleeding, noted when she whipes after using the bathroom. Patient began menopause at age 66. Patient denies any SOB or dizziness, but reports fatigue and feeling cold. Reason for AUB could be malignancy, or fibroids, patient will need endometrial biopsy and transvaginal ultrasound. Patient encouraged to schedule appointment for colpo clinic.  -CBC, TSH -Endometrial biopsy -Transvaginal ultrasound

## 2022-02-08 NOTE — Patient Instructions (Signed)
It was great to see you! Thank you for allowing me to participate in your care!  I recommend that you always bring your medications to each appointment as this makes it easy to ensure we are on the correct medications and helps [place] not miss when refills are needed.  Our plans for today:  - Internal Hemorrhoids   Follow up with [facility] Address: [address], [place], [place] [address] Phone: [phone] - Vaginal bleeding    CBC for low Hgb  TSH for being cold  Transvaginal Ultrasound  Endometrial biopsy   Schedule at front, request [place] clinic and say you need an endometrial biopsy.   We are checking some labs today, I will call you if they are abnormal will send you a MyChart message or a letter if they are normal.  If you do not hear about your labs in the next 2 weeks please let us know.  Take care and seek immediate care sooner if you develop any concerns.   Dr. Holley Bouche, MD [place]

## 2022-02-09 ENCOUNTER — Encounter: Payer: Self-pay | Admitting: Student

## 2022-02-09 LAB — CBC
Hematocrit: 43.5 % (ref 34.0–46.6)
Hemoglobin: 15 g/dL (ref 11.1–15.9)
MCH: 32.2 pg (ref 26.6–33.0)
MCHC: 34.5 g/dL (ref 31.5–35.7)
MCV: 93 fL (ref 79–97)
Platelets: 256 10*3/uL (ref 150–450)
RBC: 4.66 x10E6/uL (ref 3.77–5.28)
RDW: 11.4 % — ABNORMAL LOW (ref 11.7–15.4)
WBC: 5 10*3/uL (ref 3.4–10.8)

## 2022-02-09 LAB — TSH RFX ON ABNORMAL TO FREE T4: TSH: 0.543 u[IU]/mL (ref 0.450–4.500)

## 2022-02-10 ENCOUNTER — Encounter: Payer: Self-pay | Admitting: Family Medicine

## 2022-02-14 ENCOUNTER — Ambulatory Visit
Admission: RE | Admit: 2022-02-14 | Discharge: 2022-02-14 | Disposition: A | Discharge status: Home / Self Care | Payer: Medicaid Other | Source: Ambulatory Visit | Attending: Family Medicine | Admitting: Family Medicine

## 2022-02-14 DIAGNOSIS — N939 Abnormal uterine and vaginal bleeding, unspecified: Secondary | ICD-10-CM | POA: Insufficient documentation

## 2022-02-15 ENCOUNTER — Other Ambulatory Visit: Payer: Self-pay | Admitting: Student

## 2022-02-15 DIAGNOSIS — N939 Abnormal uterine and vaginal bleeding, unspecified: Secondary | ICD-10-CM

## 2022-02-18 NOTE — Progress Notes (Unsigned)
SUBJECTIVE:   CHIEF COMPLAINT / HPI:   Monica Watson is a 66 y.o. female who presents to the [facility] clinic today to discuss the following concerns:   Difficulty with ADL's Notes she has plans for left reverse shoulder arthroplasty next month. She has significant pain with her shoulder. She notes that her back gives her trouble when she walks. She can walk about 6 blocks without having to stop. No SOB with exertion. She states that she walks slow- unable to walk on the grass anymore (causes her more pain). No recent falls in the last year. She sees a pain doctor who manages her medications for her OA pain. Currently on percocet and gabapentin. States that she still has some klonopin that she takes as needed, helps her legs "stop shaking". Was wanting personal care services. Interested in therapy for rehabilitation. Would like to wait until after surgery for PT/OT.   ADL's Bathing: She can take a shower. Unable to take a bath 2/2 knee pain. She enjoys baths so this saddens her.  Toileting: Goes to the bathroom independently Getting dressed: Independently Walking: Can walk 6 blocks Eating meals: Independently Personal hygiene: Independently   Instrumental ADL Doing laundry: A friend sometimes takes her to the laundry mat and helps fold clothes Paying bills: Independently  Preparing meals: Independently  Shopping for groceries: Friend helps her on occasion  Managing chores and cleaning: "I do the best I can". Sometimes friend assists   Depressed Mood Feels down. Sometimes just wants to cry. Daughter lives in [place], son in [place]. Has another son nearby in [place] but he doesn't come visit often. She feels as though she doesn't have much support. Denies thoughts of harming herself. She thinks that maybe she is down because she doesn't have a significant other in her life. She has never been married- reports she has been asked but never by the right person. Music and some wine makes her  happy (only drinks when her daughter is home). She otherwise doesn't feel like she has hobbies that bring her pleasure.   Hx CVA LDL 79 in January, currently on Atorvastatin 20 mg. Not on ASA due to previous GIB. This is not in our records but patient estimates about 8 years ago. She is NOT interested in restarting aspirin at this time. Discussed potential risk of stroke without anti-platelets and she is aware.   Abnormal Uterine Bleeding Seen by Dr. Marcina Watson on 8/17. She has since had her pelvic U/S. She is scheduled in colposcopy clinic on 8/31 for endometrial biopsy.  Health Maintenance Due for DEXA, Mammogram.    PERTINENT  PMH / PSH: Hypertension, CVA, depression, bilateral osteoarthritis of the knees s/p replacement  OBJECTIVE:   BP 118/80   Pulse 76   Wt 189 lb 3.2 oz (85.8 kg)   SpO2 100%   BMI 31.48 kg/m    General: NAD, pleasant, able to participate in exam Respiratory: Normal respiratory effort on room air Skin: warm and dry, no rashes noted Neuro: alert, no obvious focal deficits, slightly antalgic gait. MSK: Able to get up from seated position unassisted Psych: Depressed affect      02/19/2022    1:36 PM 02/08/2022    1:35 PM 01/11/2022    2:37 PM  Depression screen PHQ 2/9  Decreased Interest 0 0 0  Down, Depressed, Hopeless 1 0 1  PHQ - 2 Score 1 0 1  Altered sleeping 0 1 1  Tired, decreased energy 0 0 0  Change in appetite 0 0 1  Feeling bad or failure about yourself  0 1 0  Trouble concentrating 0 0 0  Moving slowly or fidgety/restless 1 0 1  Suicidal thoughts 0 0 0  PHQ-9 Score '2 2 4     '$ ASSESSMENT/PLAN:   Osteoarthritis Has osteoarthritis and is s/p b/l knee replacements. She is scheduled to undergo left reverse shoulder arthroplasty in October. She had previously inquired about PCS services.  Given her independence, this is not an option for her.  She is able to do ADL's and IADL's independently with only minimal and intermittent assistance from her  friend for her IADLs. She would likely benefit from outpatient PT/OT.  Patient elects to wait until after her surgery. -Recommend outpatient PT/OT after surgery   History of stroke Sent new prescription for atorvastatin 40 mg given her LDL was not at goal last check.  She is off of aspirin due to previous history of GI bleed.  I am unable to confirm this in her records, however patient remembers this episode vividly and adamantly declines restarting aspirin.  I discussed with her the recommendations for ASA after CVA and she understands and still would like to hold off at this time.  -Start atorvastatin 40 mg  Hypertension associated with diabetes ([place]) Goal <140/90.  -BMP today -Continue Losartan 50 mg daily  Depressed mood Reports depressed mood lately. PHQ-9 with a score of 2. She has good family support but unfortunately most of her children are not nearby. She is not having thoughts of self-harm. Reported being on medication for depression in the past and is not interested in restarting. She was amenable to therapy today.  -Provided with resources for therapy/counseling -Follow up in 1 month -Patient to reach out if she changes her mind regarding medications   Abnormal uterine bleeding (AUB) Scheduled in colpo clinic for endometrial bx this Thursday. Reminded patient of appointment. Recent CBC reviewed and within normal limits. Her pelvic ultrasound from 8/23 showed multiple uterine leiomyomata and 2 mm endometrium.   Healthcare maintenance Ordered mammogram, DEXA, and low-dose CT for cancer/osteoporosis screenings.      Sharion Settler, [place]

## 2022-02-19 ENCOUNTER — Other Ambulatory Visit: Payer: Self-pay

## 2022-02-19 ENCOUNTER — Ambulatory Visit (INDEPENDENT_AMBULATORY_CARE_PROVIDER_SITE_OTHER): Payer: Medicaid Other | Admitting: Family Medicine

## 2022-02-19 ENCOUNTER — Encounter: Payer: Self-pay | Admitting: Family Medicine

## 2022-02-19 VITALS — BP 118/80 | HR 76 | Wt 189.2 lb

## 2022-02-19 DIAGNOSIS — I152 Hypertension secondary to endocrine disorders: Secondary | ICD-10-CM | POA: Diagnosis not present

## 2022-02-19 DIAGNOSIS — R4589 Other symptoms and signs involving emotional state: Secondary | ICD-10-CM | POA: Diagnosis not present

## 2022-02-19 DIAGNOSIS — Z122 Encounter for screening for malignant neoplasm of respiratory organs: Secondary | ICD-10-CM

## 2022-02-19 DIAGNOSIS — Z13820 Encounter for screening for osteoporosis: Secondary | ICD-10-CM

## 2022-02-19 DIAGNOSIS — Z8673 Personal history of transient ischemic attack (TIA), and cerebral infarction without residual deficits: Secondary | ICD-10-CM

## 2022-02-19 DIAGNOSIS — Z0000 Encounter for general adult medical examination without abnormal findings: Secondary | ICD-10-CM

## 2022-02-19 DIAGNOSIS — Z1231 Encounter for screening mammogram for malignant neoplasm of breast: Secondary | ICD-10-CM

## 2022-02-19 DIAGNOSIS — R7303 Prediabetes: Secondary | ICD-10-CM

## 2022-02-19 DIAGNOSIS — N939 Abnormal uterine and vaginal bleeding, unspecified: Secondary | ICD-10-CM | POA: Diagnosis not present

## 2022-02-19 DIAGNOSIS — M1990 Unspecified osteoarthritis, unspecified site: Secondary | ICD-10-CM | POA: Diagnosis not present

## 2022-02-19 DIAGNOSIS — E1159 Type 2 diabetes mellitus with other circulatory complications: Secondary | ICD-10-CM | POA: Diagnosis present

## 2022-02-19 MED ORDER — ATORVASTATIN CALCIUM 40 MG PO TABS: 40.0000 mg | ORAL_TABLET | Freq: Every day | ORAL | 3 refills | Status: DC

## 2022-02-19 NOTE — Assessment & Plan Note (Signed)
Ordered mammogram, DEXA, and low-dose CT for cancer/osteoporosis screenings.

## 2022-02-19 NOTE — Assessment & Plan Note (Signed)
Has osteoarthritis and is s/p b/l knee replacements. She is scheduled to undergo left reverse shoulder arthroplasty in October. She had previously inquired about PCS services.  Given her independence, this is not an option for her.  She is able to do ADL's and IADL's independently with only minimal and intermittent assistance from her friend for her IADLs. She would likely benefit from outpatient PT/OT.  Patient elects to wait until after her surgery. -Recommend outpatient PT/OT after surgery

## 2022-02-19 NOTE — Assessment & Plan Note (Signed)
Scheduled in [place] clinic for endometrial bx this Thursday. Reminded patient of appointment. Recent CBC reviewed and within normal limits. Her pelvic ultrasound from 8/23 showed multiple uterine leiomyomata and 2 mm endometrium.

## 2022-02-19 NOTE — Assessment & Plan Note (Signed)
Sent new prescription for atorvastatin 40 mg given her LDL was not at goal last check.  She is off of aspirin due to previous history of GI bleed.  I am unable to confirm this in her records, however patient remembers this episode vividly and adamantly declines restarting aspirin.  I discussed with her the recommendations for ASA after CVA and she understands and still would like to hold off at this time.  -Start atorvastatin 40 mg

## 2022-02-19 NOTE — Assessment & Plan Note (Addendum)
Goal <140/90.  -BMP today -Continue Losartan 50 mg daily

## 2022-02-19 NOTE — Assessment & Plan Note (Signed)
>>  ASSESSMENT AND PLAN FOR DEPRESSED MOOD WRITTEN ON 02/19/2022  5:06 PM BY ESPINOZA, ALEJANDRA, DO  Reports depressed mood lately. PHQ-9 with a score of 2. She has good family support but unfortunately most of her children are not nearby. She is not having thoughts of self-harm. Reported being on medication for depression in the past and is not interested in restarting. She was amenable to therapy today.  -Provided with resources for therapy/counseling -Follow up in 1 month -Patient to reach out if she changes her mind regarding medications

## 2022-02-19 NOTE — Assessment & Plan Note (Signed)
Reports depressed mood lately. PHQ-9 with a score of 2. She has good family support but unfortunately most of her children are not nearby. She is not having thoughts of self-harm. Reported being on medication for depression in the past and is not interested in restarting. She was amenable to therapy today.  -Provided with resources for therapy/counseling -Follow up in 1 month -Patient to reach out if she changes her mind regarding medications

## 2022-02-19 NOTE — Patient Instructions (Addendum)
It was wonderful to see you today.  Please bring ALL of your medications with you to every visit.   Today we talked about:  -We are doing lab work today to check your electrolytes and kidney function. I will send you a MyChart message if you have MyChart. Otherwise, I will give you a call for abnormal results or send a letter if everything returned back normal. If you don't hear from me in 2 weeks, please call the office.   -You are due for a mammogram and a bone density test. You can get these done at [facility]. Call to schedule.  -You can call the surgeons for an appointment for your hemorrhoids. Address: [address] [place], [place], [place] [address] Phone: [phone]  Thank you for choosing [facility].   Please call [phone] with any questions about today's appointment.  Please be sure to schedule follow up at the front  desk before you leave today.   Sharion Settler, DO PGY-3 Family Medicine     Therapy and Counseling Resources Most providers on this list will take Medicaid. Patients with commercial insurance or Medicare should contact their insurance company to get a list of in network providers.  [employer] (takes children) Location 1: [address], [place], [place] [address] Location 2: [place], [place] [address] [place] ([place] speaking therapist available)(habla espanol)(take medicare and medicaid)  [place], [place], [place] [address], [place] [email or web address] [phone]  [facility]:Psychiatry and Counseling [address] [place], [place] [address] [place], [place], [place] [address]      [phone]  [place] (spanish available) [place], [place] [address] [place] (take [facility] and medicare) [address], [place], [place] [address]       [phone]     [place] (virtual only) [phone]  Jinny Blossom [facility] [address], [place], [place]  Family Solutions:  [place]. [place] [phone]  [facility]:  [place] [address] [phone]  [facility] (under & uninsured) [address], [place] [phone]    [email or web address]    [place] [phone] [address]  [place]    [phone]  [facility] [place]     Phone:  [phone]     Richey [place]  [place] [address]      [place], [place] [address] [place]: [place], [place], [place]   [place] ([place] therapist) [email or web address]  [place] [address]   [place] [address]    [phone]    [facility]   [address],  [place], [place]   [place]  [place]  [facility]  [address] [place], [place]        [phone]  Open Access/Walk In Clinic under & uninsured  [facility]  [address] [place], [place] Crisis [phone]  [facility] [place],  ([place])   [place], [place]: [phone])  8:30 - 12; 1 - 2:30  [facility] the [place],  [address] [place]    [phone]):8:30 - 12; 2 - 3PM  [facility] [employer],  [address],  [place]; [phone]):   Mon - Fri 8 AM - 5 PM  [facility] [place]  MWF 12:30 to 3:00 or call to schedule an appointment  [phone]  Specific Provider options Psychology Today   [email or web address] click on find a therapist  enter your zip code left side and select or tailor a therapist for your specific need.   [facility] Provider Directory [email or web address]  (Medicaid)   Follow all drop down to find a provider  [place] or [email or web address] [address], [place], [place] Recovery support and educational   24- Hour Availability:   [facility]  [address] [place], [place] Crisis [phone]  [facility] of the [employer] [phone]  [place]  [phone]   [place]  [phone] (after hours)  Therapeutic Alternative/Mobile Crisis   [phone]  [place] National Suicide Hotline  4148572158 Diamantina Monks)  Call 911 or go to emergency room  [facility]  [phone]);  [place] and [employer]  [phone]); [place], [place], [place], [place], [place], [place], [place]

## 2022-02-20 ENCOUNTER — Other Ambulatory Visit (HOSPITAL_BASED_OUTPATIENT_CLINIC_OR_DEPARTMENT_OTHER): Payer: Self-pay | Admitting: Orthopaedic Surgery

## 2022-02-20 ENCOUNTER — Encounter (HOSPITAL_BASED_OUTPATIENT_CLINIC_OR_DEPARTMENT_OTHER): Payer: Self-pay

## 2022-02-20 ENCOUNTER — Telehealth: Payer: Self-pay

## 2022-02-20 ENCOUNTER — Telehealth: Payer: Self-pay | Admitting: Orthopaedic Surgery

## 2022-02-20 LAB — BASIC METABOLIC PANEL
BUN/Creatinine Ratio: 12 (ref 12–28)
BUN: 10 mg/dL (ref 8–27)
CO2: 24 mmol/L (ref 20–29)
Calcium: 8.9 mg/dL (ref 8.7–10.3)
Chloride: 107 mmol/L — ABNORMAL HIGH (ref 96–106)
Creatinine, Ser: 0.81 mg/dL (ref 0.57–1.00)
Glucose: 118 mg/dL — ABNORMAL HIGH (ref 70–99)
Potassium: 4 mmol/L (ref 3.5–5.2)
Sodium: 143 mmol/L (ref 134–144)
eGFR: 81 mL/min/{1.73_m2} (ref >59)

## 2022-02-20 MED ORDER — TRAMADOL HCL 50 MG PO TABS: 50.0000 mg | ORAL_TABLET | Freq: Four times a day (QID) | ORAL | 0 refills | Status: DC | PRN

## 2022-02-20 NOTE — Telephone Encounter (Signed)
Responded to message via [place]

## 2022-02-20 NOTE — Telephone Encounter (Signed)
Patient calling because she is having increased pain with shoulder. Patient having trouble sleeping due to pain. Patient goes back to pain doctor on September 7th. Please call to advise. Patient requests a call today to discuss, if possible.

## 2022-02-20 NOTE — Telephone Encounter (Signed)
Patient informed of upcoming CT at [facility].  03/07/2022 CT Chest 1345 arrival  1430 is time of CT Chest which will be followed by CT of shoulder.  Ozella Almond, [place]

## 2022-02-20 NOTE — Telephone Encounter (Signed)
Patient called in stating he Pain Management Dr said she cannot have tramadol she she will not be picking it up

## 2022-02-21 ENCOUNTER — Telehealth: Payer: Self-pay | Admitting: Podiatry

## 2022-02-21 NOTE — Telephone Encounter (Signed)
Pt left message at [phone] with just a name and phone number.  I returned call and left message for pt to call to see how we can help her.

## 2022-02-22 ENCOUNTER — Other Ambulatory Visit: Payer: Self-pay | Admitting: Family Medicine

## 2022-02-22 ENCOUNTER — Ambulatory Visit (INDEPENDENT_AMBULATORY_CARE_PROVIDER_SITE_OTHER): Payer: Medicaid Other | Admitting: Student

## 2022-02-22 VITALS — BP 110/72 | HR 100 | Wt 188.0 lb

## 2022-02-22 DIAGNOSIS — N939 Abnormal uterine and vaginal bleeding, unspecified: Secondary | ICD-10-CM | POA: Diagnosis not present

## 2022-02-22 DIAGNOSIS — N950 Postmenopausal bleeding: Secondary | ICD-10-CM | POA: Diagnosis present

## 2022-02-22 NOTE — Patient Instructions (Addendum)
It was great to see you today! Thank you for choosing [facility] for your primary care. Monica Watson was seen for endometrial biopsy.  Today we addressed: Continuing with endometrial biopsy. You will likely have bleeding after this procedure that will go away in a couple of days. Please take Tylenol for pain relief. If you experience fever, chills, excessive pain or bleeding, please return immediately or seek healthcare   If you haven't already, sign up for My Chart to have easy access to your labs results, and communication with your primary care physician.  We are checking some labs today. If they are abnormal, I will call you. If they are normal, I will send you a MyChart message (if it is active) or a letter in the mail. If you do not hear about your labs in the next 2 weeks, please call the office. I recommend that you always bring your medications to each appointment as this makes it easy to ensure you are on the correct medications and helps [place] not miss refills when you need them. Call the clinic at [phone] if your symptoms worsen or you have any concerns.  You should return to our clinic Return if symptoms worsen or fail to improve, for AUB . Please arrive 15 minutes before your appointment to ensure smooth check in process.  We appreciate your efforts in making this happen.  Thank you for allowing me to participate in your care, Erskine Emery, MD 02/22/2022, 9:41 AM PGY-2, [place]

## 2022-02-22 NOTE — Progress Notes (Signed)
  SUBJECTIVE:   CHIEF COMPLAINT / HPI:   Presenting for endometrial biopsy  AUB recent onset early in 01/2022 Post menopausal for several years  No other symptoms noted, noted as spotting   Pelvic U/S 02/14/22 IMPRESSION: Heterogeneous myometrium with multiple uterine leiomyomata. Endometrial complex unremarkable, 2 mm thick. Normal appearing ovaries.  PERTINENT  PMH / PSH:   Past Medical History:  Diagnosis Date   Arthritis    Cerebrovascular disease    Depression    Hypertension    Stroke ([place])    Type 2 diabetes mellitus ([place])     OBJECTIVE:  BP 110/72   Pulse 100   Wt 188 lb (85.3 kg)   SpO2 98%   BMI 31.28 kg/m   General: NAD, pleasant, able to participate in exam Respiratory: normal WOB Extremities: warm and well perfused, no edema or cyanosis Psych: Normal affect and mood GU: Scant white fluid in vaginal vault without any obvious lesions on cervix or vagina  Procedure  Endometrial biopsy: Risk and benefits explained.  Consent signed.  Time out performed.  Speculum inserted, cervix cleansed with betadine,  Tenaculum placed on posterior cervix, passage of uterine sound to 6 cm. Pipelle then was successfully passed to 6 cm with moderate sized/bloody specimen obtained and placed in formalin. Tenaculum removed. Good hemostasis achieved.  Pt tolerated procedure well with minimal discomfort.   Chaperones  RN Jim Desanctis  Dr. Andrena Mews    ASSESSMENT/PLAN:  Abnormal uterine bleeding (AUB) AUB with fibroids on pelvic ultrasound  EMB performed today and tolerated well by patient  Aftercare given and discussed  Return if patient experiences any infectious symptoms.    No orders of the defined types were placed in this encounter.  No orders of the defined types were placed in this encounter.  Return if symptoms worsen or fail to improve, for AUB . Erskine Emery, MD 02/22/2022, 10:37 AM PGY-2, [place]

## 2022-02-22 NOTE — Assessment & Plan Note (Signed)
AUB with fibroids on pelvic ultrasound  EMB performed today and tolerated well by patient  Aftercare given and discussed  Return if patient experiences any infectious symptoms.

## 2022-02-27 ENCOUNTER — Telehealth: Payer: Self-pay | Admitting: Orthopaedic Surgery

## 2022-02-27 NOTE — Telephone Encounter (Signed)
Per patient she is taking her pain medicine, but still wakes up in the middle of the night crying due to pain. Patient wants to know if you can do surgery sooner than October? Please call to discuss.

## 2022-03-01 ENCOUNTER — Telehealth: Payer: Self-pay | Admitting: Family Medicine

## 2022-03-01 NOTE — Telephone Encounter (Signed)
Result discussed. Endometrial biopsy showed chronic endometrial inflammation. Discussed f/u with GYN if bleeding persists. She stated that her bleeding stopped and will monitor for now. I advised f/u with PCP soon for further discussion. She agreed with the plan.

## 2022-03-07 ENCOUNTER — Encounter (HOSPITAL_BASED_OUTPATIENT_CLINIC_OR_DEPARTMENT_OTHER): Payer: Self-pay

## 2022-03-07 ENCOUNTER — Ambulatory Visit (HOSPITAL_BASED_OUTPATIENT_CLINIC_OR_DEPARTMENT_OTHER)
Admission: RE | Admit: 2022-03-07 | Discharge: 2022-03-07 | Disposition: A | Discharge status: Home / Self Care | Payer: Medicaid Other | Source: Ambulatory Visit | Attending: Orthopaedic Surgery | Admitting: Orthopaedic Surgery

## 2022-03-07 ENCOUNTER — Ambulatory Visit (HOSPITAL_BASED_OUTPATIENT_CLINIC_OR_DEPARTMENT_OTHER)
Admission: RE | Admit: 2022-03-07 | Discharge: 2022-03-07 | Disposition: A | Discharge status: Home / Self Care | Payer: Medicaid Other | Source: Ambulatory Visit | Attending: Family Medicine | Admitting: Family Medicine

## 2022-03-07 DIAGNOSIS — M19012 Primary osteoarthritis, left shoulder: Secondary | ICD-10-CM | POA: Insufficient documentation

## 2022-03-07 DIAGNOSIS — Z122 Encounter for screening for malignant neoplasm of respiratory organs: Secondary | ICD-10-CM | POA: Diagnosis present

## 2022-03-07 DIAGNOSIS — M19011 Primary osteoarthritis, right shoulder: Secondary | ICD-10-CM | POA: Insufficient documentation

## 2022-03-13 ENCOUNTER — Ambulatory Visit
Admission: RE | Admit: 2022-03-13 | Discharge: 2022-03-13 | Disposition: A | Discharge status: Home / Self Care | Payer: Medicaid Other | Source: Ambulatory Visit | Attending: Family Medicine | Admitting: Family Medicine

## 2022-03-13 DIAGNOSIS — Z1231 Encounter for screening mammogram for malignant neoplasm of breast: Secondary | ICD-10-CM

## 2022-03-18 NOTE — Progress Notes (Signed)
    SUBJECTIVE:   CHIEF COMPLAINT / HPI:   Monica Watson is a 66 y.o. female who presents to the [facility] clinic today to discuss the following concerns:   Skin Bump Lump under left year, present since last year. She was seen in Urgent Care for this in the past- was told to eat sour candy. She reports that did not help much. The last two and a half months it has grown in size. It is not too discomforting but sometimes is irritating when she turns her head. She has not needed to take any OTC medications for the discomfort. No fevers.   Prediabetes Last Hgb A1c 6.2 in June. As high as 6.4% in December of 2021. Will obtain repeat A1c today.   PERTINENT  PMH / PSH:  Past Medical History:  Diagnosis Date   Arthritis    Cerebrovascular disease    Depression    Hypertension    Stroke ([place])    Type 2 diabetes mellitus ([place])    OBJECTIVE:   BP 120/82   Pulse 76   Wt 184 lb (83.5 kg)   SpO2 96%   BMI 30.62 kg/m    General: NAD, pleasant, able to participate in exam Respiratory: Normal work of breathing on room air Skin: 5 x 5 cm soft, slightly mobile, nontender, nonerythematous mass inferior to left ear (see picture below) Psych: Normal affect and mood         03/20/2022    1:29 PM 02/22/2022    9:15 AM 02/19/2022    1:36 PM  Depression screen PHQ 2/9  Decreased Interest 1 0 0  Down, Depressed, Hopeless '1 1 1  '$ PHQ - 2 Score '2 1 1  '$ Altered sleeping 0 0 0  Tired, decreased energy 0 0 0  Change in appetite 0 0 0  Feeling bad or failure about yourself  0 0 0  Trouble concentrating 1 0 0  Moving slowly or fidgety/restless 0 1 1  Suicidal thoughts 0 0 0  PHQ-9 Score '3 2 2    '$ ASSESSMENT/PLAN:   Hypertension associated with diabetes ([place]) Well controlled today. On ARB.  -Urine microalbumin today -Continue current regimen  Type 2 diabetes mellitus without complication, without long-term current use of insulin (HCC) Previously prediabetic, now A1c in range for diabetes  (6.7%). Discussed with patient, she is amenable to starting Metformin.  -Start Metformin XR 500 mg once a day x7 days, then increase to BID if tolerating well -Next A1c in 6 months  Hemorrhoids Provided with number to contact [facility] [place] [facility] for hemorrhoid removal still bothersome to patient.  Neck mass Approximately 5 x 5 cm, slightly mobile, soft, nonerythematous, nontender mass inferior to her left ear.  Has been growing over the last several months.  No signs of acute infection at this time, however given the location and growth will send referral to ENT for further workup/removal.      Sharion Settler, [place]

## 2022-03-18 NOTE — Patient Instructions (Incomplete)
It was wonderful to see you today.  Today we talked about:  I have placed a referral for ENT.  They may want to do imaging or drain the mass.  We will check your hemoglobin A1c level today. 5.7%-6.4% is prediabetes, 6.5% and above is in the range for diabetes. We will need to watch this closely as you have been prediabetic.  For your hemorrhoids you can contact the office below: [place].  Phone: [phone] Address: [name], [place], [place] [address]   Thank you for choosing [facility].   Please call [phone] with any questions about today's appointment.  Please be sure to schedule follow up at the front  desk before you leave today.   Sharion Settler, DO PGY-3 Family Medicine

## 2022-03-20 ENCOUNTER — Encounter: Payer: Self-pay | Admitting: Family Medicine

## 2022-03-20 ENCOUNTER — Ambulatory Visit (INDEPENDENT_AMBULATORY_CARE_PROVIDER_SITE_OTHER): Payer: Medicaid Other | Admitting: Family Medicine

## 2022-03-20 ENCOUNTER — Other Ambulatory Visit: Payer: Self-pay

## 2022-03-20 VITALS — BP 120/82 | HR 76 | Wt 184.0 lb

## 2022-03-20 DIAGNOSIS — I10 Essential (primary) hypertension: Secondary | ICD-10-CM

## 2022-03-20 DIAGNOSIS — E1159 Type 2 diabetes mellitus with other circulatory complications: Secondary | ICD-10-CM | POA: Diagnosis not present

## 2022-03-20 DIAGNOSIS — R4589 Other symptoms and signs involving emotional state: Secondary | ICD-10-CM

## 2022-03-20 DIAGNOSIS — R7303 Prediabetes: Secondary | ICD-10-CM

## 2022-03-20 DIAGNOSIS — E119 Type 2 diabetes mellitus without complications: Secondary | ICD-10-CM | POA: Diagnosis not present

## 2022-03-20 DIAGNOSIS — R221 Localized swelling, mass and lump, neck: Secondary | ICD-10-CM

## 2022-03-20 DIAGNOSIS — D119 Benign neoplasm of major salivary gland, unspecified: Secondary | ICD-10-CM | POA: Insufficient documentation

## 2022-03-20 DIAGNOSIS — K648 Other hemorrhoids: Secondary | ICD-10-CM

## 2022-03-20 DIAGNOSIS — I152 Hypertension secondary to endocrine disorders: Secondary | ICD-10-CM | POA: Diagnosis not present

## 2022-03-20 LAB — POCT GLYCOSYLATED HEMOGLOBIN (HGB A1C): HbA1c, POC (controlled diabetic range): 6.7 % (ref 0.0–7.0)

## 2022-03-20 MED ORDER — METFORMIN HCL ER 500 MG PO TB24: ORAL_TABLET | ORAL | 3 refills | Status: AC

## 2022-03-20 NOTE — Assessment & Plan Note (Signed)
Well controlled today. On ARB.  -Urine microalbumin today -Continue current regimen

## 2022-03-20 NOTE — Assessment & Plan Note (Signed)
Approximately 5 x 5 cm, slightly mobile, soft, nonerythematous, nontender mass inferior to her left ear.  Has been growing over the last several months.  No signs of acute infection at this time, however given the location and growth will send referral to ENT for further workup/removal.

## 2022-03-20 NOTE — Assessment & Plan Note (Signed)
Provided with number to contact [facility] [place] [facility] for hemorrhoid removal still bothersome to patient.

## 2022-03-20 NOTE — Assessment & Plan Note (Signed)
Previously prediabetic, now A1c in range for diabetes (6.7%). Discussed with patient, she is amenable to starting Metformin.  -Start Metformin XR 500 mg once a day x7 days, then increase to BID if tolerating well -Next A1c in 6 months

## 2022-03-20 NOTE — Assessment & Plan Note (Signed)
>>  ASSESSMENT AND PLAN FOR DEPRESSED MOOD WRITTEN ON 03/20/2022  2:51 PM BY Sabino Dick, DO  Patient reports that she still been feeling down.  She has not yet contacted the therapist but still has the resources provided previously.  She is open to contacting a therapist. PHQ-9 score 3, no SI.  -Encouraged patient to reach out to therapist

## 2022-03-20 NOTE — Assessment & Plan Note (Signed)
Patient reports that she still been feeling down.  She has not yet contacted the therapist but still has the resources provided previously.  She is open to contacting a therapist. PHQ-9 score 3, no SI.  -Encouraged patient to reach out to therapist

## 2022-03-21 LAB — MICROALBUMIN / CREATININE URINE RATIO
Creatinine, Urine: 291 mg/dL
Microalb/Creat Ratio: 6 mg/g creat (ref 0–29)
Microalbumin, Urine: 17 ug/mL

## 2022-03-29 ENCOUNTER — Encounter (HOSPITAL_BASED_OUTPATIENT_CLINIC_OR_DEPARTMENT_OTHER): Payer: Medicaid Other | Admitting: Orthopaedic Surgery

## 2022-04-09 ENCOUNTER — Other Ambulatory Visit: Payer: Self-pay | Admitting: Otolaryngology

## 2022-04-09 DIAGNOSIS — D490 Neoplasm of unspecified behavior of digestive system: Secondary | ICD-10-CM

## 2022-04-09 DIAGNOSIS — Z720 Tobacco use: Secondary | ICD-10-CM

## 2022-04-10 ENCOUNTER — Ambulatory Visit (HOSPITAL_BASED_OUTPATIENT_CLINIC_OR_DEPARTMENT_OTHER): Payer: Self-pay | Admitting: Orthopaedic Surgery

## 2022-04-10 DIAGNOSIS — M25512 Pain in left shoulder: Secondary | ICD-10-CM

## 2022-04-10 NOTE — Progress Notes (Signed)
Surgical Instructions    Your procedure is scheduled on April 23, 2022.  Report to [facility] Main Entrance "A" at 10:15 A.M., then check in with the Admitting office.  Call this number if you have problems the morning of surgery:  [phone]   If you have any questions prior to your surgery date call [phone]: Open Monday-Friday 8am-4pm If you experience any cold or flu symptoms such as cough, fever, chills, shortness of breath, etc. between now and your scheduled surgery, please notify us at the above number     Remember:  Do not eat after midnight the night before your surgery  You may drink clear liquids until 09:15am the morning of your surgery.   Clear liquids allowed are: Water, Non-Citrus Juices (without pulp), Carbonated Beverages, Clear Tea, Black Coffee ONLY (NO MILK, CREAM OR POWDERED CREAMER of any kind), and Gatorade   Enhanced Recovery after Surgery for Orthopedics Enhanced Recovery after Surgery is a protocol used to improve the stress on your body and your recovery after surgery.  Patient Instructions   The day of surgery (if you have diabetes):  Drink ONE small 10 oz bottle of water or Gatorade Zero by 9:15am the morning of surgery This bottle was given to you during your hospital  pre-op appointment visit.  Nothing else to drink after completing the  Small 10 oz bottle of water or Gatorade Zero.         If you have questions, please contact your surgeon's office.     Take these medicines the morning of surgery with A SIP OF WATER:  Atorvastatin (Lipitor)  If Needed: Benzonatate (Tessalon) Oxycodone-Acetaminophen (Percocet) Promethazine (Phenergan) Tizanidine (Zanaflex)   As of today, STOP taking any Aspirin (unless otherwise instructed by your surgeon) Aleve, Naproxen, Ibuprofen, Motrin, Advil, Goody's, BC's, all herbal medications, fish oil, and all vitamins.  WHAT DO I DO ABOUT MY DIABETES MEDICATION?   Do not take oral diabetes  medicines (pills) the morning of surgery. DO NOT TAKE METFORMIN (GLUCOPHAGE-XR) the morning of surgery.   HOW TO MANAGE YOUR DIABETES BEFORE AND AFTER SURGERY  Why is it important to control my blood sugar before and after surgery? Improving blood sugar levels before and after surgery helps healing and can limit problems. A way of improving blood sugar control is eating a healthy diet by:  Eating less sugar and carbohydrates  Increasing activity/exercise  Talking with your doctor about reaching your blood sugar goals High blood sugars (greater than 180 mg/dL) can raise your risk of infections and slow your recovery, so you will need to focus on controlling your diabetes during the weeks before surgery. Make sure that the doctor who takes care of your diabetes knows about your planned surgery including the date and location.  How do I manage my blood sugar before surgery? Check your blood sugar at least 4 times a day, starting 2 days before surgery, to make sure that the level is not too high or low.  Check your blood sugar the morning of your surgery when you wake up and every 2 hours until you get to the Short Stay unit.  If your blood sugar is less than 70 mg/dL, you will need to treat for low blood sugar: Do not take insulin. Treat a low blood sugar (less than 70 mg/dL) with  cup of clear juice (cranberry or apple), 4 glucose tablets, OR glucose gel. Recheck blood sugar in 15 minutes after treatment (to make sure it is greater than 70  mg/dL). If your blood sugar is not greater than 70 mg/dL on recheck, call [phone] for further instructions. Report your blood sugar to the short stay nurse when you get to Short Stay.  If you are admitted to the hospital after surgery: Your blood sugar will be checked by the staff and you will probably be given insulin after surgery (instead of oral diabetes medicines) to make sure you have good blood sugar levels. The goal for blood sugar control  after surgery is 80-180 mg/dL.            Do not wear jewelry or makeup. Do not wear lotions, powders, perfumes/cologne or deodorant. Do not shave 48 hours prior to surgery.   Do not bring valuables to the hospital. Do not wear nail polish, gel polish, artificial nails, or any other type of covering on natural nails (fingers and toes) If you have artificial nails or gel coating that need to be removed by a nail salon, please have this removed prior to surgery. Artificial nails or gel coating may interfere with anesthesia's ability to adequately monitor your vital signs.  [facility] is not responsible for any belongings or valuables.    Do NOT Smoke (Tobacco/Vaping)  24 hours prior to your procedure  If you use a CPAP at night, you may bring your mask for your overnight stay.   Contacts, glasses, hearing aids, dentures or partials may not be worn into surgery, please bring cases for these belongings   For patients admitted to the hospital, discharge time will be determined by your treatment team.   Patients discharged the day of surgery will not be allowed to drive home, and someone needs to stay with them for 24 hours.   SURGICAL WAITING ROOM VISITATION Patients having surgery or a procedure may have no more than 2 support people in the waiting area - these visitors may rotate.   Children under the age of 64 must have an adult with them who is not the patient. If the patient needs to stay at the hospital during part of their recovery, the visitor guidelines for inpatient rooms apply. Pre-op nurse will coordinate an appropriate time for 1 support person to accompany patient in pre-op.  This support person may not rotate.   Please refer to [email or web address] for the visitor guidelines for Inpatients (after your surgery is over and you are in a regular room).    Special instructions:    [facility]- Preparing for Total Shoulder  Arthroplasty   Before surgery, you can play an important role. Because skin is not sterile, your skin needs to be as free of germs as possible. You can reduce the number of germs on your skin by using the following products. Benzoyl Peroxide Gel Reduces the number of germs present on the skin Applied twice a day to shoulder area starting two days before surgery   Chlorhexidine Gluconate (CHG) Soap An antiseptic cleaner that kills germs and bonds with the skin to continue killing germs even after washing Used for showering the night before surgery and morning of surgery   Oral Hygiene is also important to reduce your risk of infection.                                    Remember - BRUSH YOUR TEETH THE MORNING OF SURGERY WITH YOUR REGULAR TOOTHPASTE  ==================================================================  Please follow these instructions carefully:  BENZOYL PEROXIDE 5%  GEL  Please do not use if you have an allergy to benzoyl peroxide.   If your skin becomes reddened/irritated stop using the benzoyl peroxide.  Starting two days before surgery, apply as follows: Apply benzoyl peroxide in the morning and at night. Apply after taking a shower. If you are not taking a shower clean entire shoulder front, back, and side along with the armpit with a clean wet washcloth.  Place a quarter-sized dollop on your shoulder and rub in thoroughly, making sure to cover the front, back, and side of your shoulder, along with the armpit.   2 days before ____ AM   ____ PM              1 day before ____ AM   ____ PM                             Do this twice a day for two days.  (Last application is the night before surgery, AFTER using the CHG soap as described below).  Do NOT apply benzoyl peroxide gel on the day of surgery.  CHLORHEXIDINE GLUCONATE (CHG) SOAP  Please do not use if you have an allergy to CHG or antibacterial soaps. If your skin becomes reddened/irritated stop using the CHG.    Do not shave (including legs and underarms) for at least 48 hours prior to first CHG shower. It is OK to shave your face.  Starting the night before surgery, use CHG soap as follows:  Shower the NIGHT BEFORE SURGERY and MORNING OF SURGERY with CHG.  If you choose to wash your hair, wash your hair first as usual with your normal shampoo.  After shampooing, rinse your hair and body thoroughly to remove the shampoo.  Use CHG as you would any other liquid soap.  You can apply CHG directly to the skin and wash gently with a scrungie or a clean washcloth.  Apply the CHG soap to your body ONLY FROM THE NECK DOWN.  Do not use on open wounds or open sores.  Avoid contact with your eyes, ears, mouth, and genitals (private parts).  Wash face and genitals (private parts) with your normal soap.  Wash thoroughly, paying special attention to the area where your surgery will be performed.  Thoroughly rinse your body with warm water from the neck down.  DO NOT shower/wash with your normal soap after using and rinsing off the CHG soap.   Pat yourself dry with a CLEAN TOWEL.    Apply benzoyl peroxide.   Wear CLEAN PAJAMAS to bed the night before surgery; wear comfortable clothes the morning of surgery.  Place CLEAN SHEETS on your bed the night of your first shower and DO NOT SLEEP WITH PETS.  Day of Surgery: Shower as above Do not apply any deodorants/lotions.  Please wear clean clothes to the hospital/surgery center.   Remember to brush your teeth WITH YOUR REGULAR TOOTHPASTE.       If you received a COVID test during your pre-op visit, it is requested that you wear a mask when out in public, stay away from anyone that may not be feeling well, and notify your surgeon if you develop symptoms. If you have been in contact with anyone that has tested positive in the last 10 days, please notify your surgeon.    Please read over the following fact sheets that you were given.

## 2022-04-11 ENCOUNTER — Encounter (HOSPITAL_COMMUNITY): Payer: Self-pay

## 2022-04-11 ENCOUNTER — Other Ambulatory Visit: Payer: Self-pay

## 2022-04-11 ENCOUNTER — Encounter (HOSPITAL_COMMUNITY)
Admission: RE | Admit: 2022-04-11 | Discharge: 2022-04-11 | Disposition: A | Discharge status: Home / Self Care | Payer: Medicare Other | Source: Ambulatory Visit | Attending: Orthopaedic Surgery | Admitting: Orthopaedic Surgery

## 2022-04-11 VITALS — BP 138/87 | HR 76 | Temp 98.6°F | Resp 17 | Ht 65.0 in | Wt 186.4 lb

## 2022-04-11 DIAGNOSIS — E119 Type 2 diabetes mellitus without complications: Secondary | ICD-10-CM | POA: Insufficient documentation

## 2022-04-11 DIAGNOSIS — I10 Essential (primary) hypertension: Secondary | ICD-10-CM | POA: Diagnosis not present

## 2022-04-11 DIAGNOSIS — Z01818 Encounter for other preprocedural examination: Secondary | ICD-10-CM | POA: Diagnosis present

## 2022-04-11 HISTORY — DX: Headache, unspecified: R51.9

## 2022-04-11 LAB — BASIC METABOLIC PANEL
Anion gap: 9 (ref 5–15)
BUN: 7 mg/dL — ABNORMAL LOW (ref 8–23)
CO2: 25 mmol/L (ref 22–32)
Calcium: 9.2 mg/dL (ref 8.9–10.3)
Chloride: 110 mmol/L (ref 98–111)
Creatinine, Ser: 0.85 mg/dL (ref 0.44–1.00)
GFR, Estimated: >60 mL/min (ref >60)
Glucose, Bld: 79 mg/dL (ref 70–99)
Potassium: 4.3 mmol/L (ref 3.5–5.1)
Sodium: 144 mmol/L (ref 135–145)

## 2022-04-11 LAB — SURGICAL PCR SCREEN
MRSA, PCR: NEGATIVE
Staphylococcus aureus: POSITIVE — AB

## 2022-04-11 LAB — GLUCOSE, CAPILLARY: Glucose-Capillary: 81 mg/dL (ref 70–99)

## 2022-04-11 LAB — CBC
HCT: 42.9 % (ref 36.0–46.0)
Hemoglobin: 14.3 g/dL (ref 12.0–15.0)
MCH: 32.1 pg (ref 26.0–34.0)
MCHC: 33.3 g/dL (ref 30.0–36.0)
MCV: 96.4 fL (ref 80.0–100.0)
Platelets: 227 10*3/uL (ref 150–400)
RBC: 4.45 MIL/uL (ref 3.87–5.11)
RDW: 12.2 % (ref 11.5–15.5)
WBC: 5.4 10*3/uL (ref 4.0–10.5)
nRBC: 0 % (ref 0.0–0.2)

## 2022-04-11 NOTE — Progress Notes (Signed)
PCP - Dr.Alejandra Espinoza  Cardiologist - denies  PPM/ICD - denies Device Orders - n/a Rep Notified - n/a  Chest x-ray - n/a EKG - 04/11/22 Stress Test - 20 years ago per pt ECHO - denies Cardiac Cath - denies  Sleep Study - denies CPAP - n/a  Diabetic "borderline" per pt-pt does not check BS at home. A1C 6.7 per chart on 03/20/22, pt was started on Metformin.  Pt does not have CBG meter. BS at PAT was 81.   Blood Thinner Instructions:denies Aspirin Instructions:denies  ERAS Protcol -yes PRE-SURGERY G2- yes given at PAT.   COVID TEST- n/a   Anesthesia review: No  Patient denies shortness of breath, fever, cough and chest pain at PAT appointment   All instructions explained to the patient, with a verbal understanding of the material. Patient agrees to go over the instructions while at home for a better understanding. The opportunity to ask questions was provided.

## 2022-04-13 ENCOUNTER — Encounter: Payer: Self-pay | Admitting: Podiatry

## 2022-04-13 ENCOUNTER — Ambulatory Visit (INDEPENDENT_AMBULATORY_CARE_PROVIDER_SITE_OTHER): Payer: Medicare Other | Admitting: Podiatry

## 2022-04-13 DIAGNOSIS — B351 Tinea unguium: Secondary | ICD-10-CM

## 2022-04-13 DIAGNOSIS — M79675 Pain in left toe(s): Secondary | ICD-10-CM

## 2022-04-13 DIAGNOSIS — E1142 Type 2 diabetes mellitus with diabetic polyneuropathy: Secondary | ICD-10-CM

## 2022-04-13 DIAGNOSIS — M79674 Pain in right toe(s): Secondary | ICD-10-CM | POA: Diagnosis not present

## 2022-04-13 NOTE — Progress Notes (Unsigned)
  Subjective:  Patient ID: Monica Watson, female    DOB: [birth year],  MRN: [id number]  Monica Watson presents to clinic today for:  Chief Complaint  Patient presents with   Nail Problem    Routine foot care PCP-Alejandra Espinoza PCP VST-Last week   New problem(s): None. {jgcomplaint:23593}  PCP is Sharion Settler, DO , and last visit was  {Time; dates multiple:15870}.  No Known Allergies  Review of Systems: Negative except as noted in the HPI.  Objective: No changes noted in today's physical examination.  Monica Watson is a pleasant 66 y.o. female {jgbodyhabitus:24098} AAO x 3.  Vascular Examination: CFT immediate b/l LE. Palpable DP/PT pulses b/l LE. Digital hair absent b/l. Skin temperature gradient WNL b/l. No pain with calf compression b/l. No edema noted b/l. No cyanosis or clubbing noted b/l LE.  Dermatological Examination: No open wounds b/l LE. Interdigital maceration noted 3rd and 4th webspace(s). No blistering, no weeping, no open wounds. Toenails 1-5 b/l elongated, discolored, dystrophic, thickened, crumbly with subungual debris and tenderness to dorsal palpation. Porokeratotic lesion(s) plantarlateral aspect right RF and submet head 5 left foot. No erythema, no edema, no drainage, no fluctuance.  Musculoskeletal Examination: Normal muscle strength 5/5 to all lower extremity muscle groups bilaterally. Pes planus deformity noted bilateral LE.Marland Kitchen No pain, crepitus or joint limitation noted with ROM b/l LE.  Patient ambulates independently without assistive aids.  Neurological Examination: Pt has subjective symptoms of neuropathy. Protective sensation intact 5/5 intact bilaterally with 10g monofilament b/l. Vibratory sensation intact b/l.  Assessment/Plan: No diagnosis found.  No orders of the defined types were placed in this encounter.   {Jgplan:23602::"-Patient/POA to call should there be question/concern in the interim."}   Return in about 3 months (around  07/14/2022).  Marzetta Board, DPM

## 2022-04-18 ENCOUNTER — Other Ambulatory Visit: Payer: Self-pay | Admitting: Podiatry

## 2022-04-18 DIAGNOSIS — B351 Tinea unguium: Secondary | ICD-10-CM

## 2022-04-22 NOTE — Anesthesia Preprocedure Evaluation (Signed)
Anesthesia Evaluation  Patient identified by MRN, date of birth, ID band Patient awake    Reviewed: Allergy & Precautions, NPO status , Patient's Chart, lab work & pertinent test results  Airway Mallampati: IV   Neck ROM: Limited  Mouth opening: Limited Mouth Opening  Dental  (+) Edentulous Upper, Missing,    Pulmonary Current Smoker and Patient abstained from smoking. Current smoker, 23 pack year history 4cigg/d No inhalers    breath sounds clear to auscultation       Cardiovascular hypertension, Pt. on medications  Rhythm:Regular Rate:Normal     Neuro/Psych  Headaches PSYCHIATRIC DISORDERS  Depression    CVA (slurred speech, occasional L weakness), Residual Symptoms    GI/Hepatic negative GI ROS, Neg liver ROS,,,  Endo/Other  diabetes, Well Controlled, Type 2, Oral Hypoglycemic Agents  Obesity BMI 31 a1c 6.2  Renal/GU negative Renal ROS     Musculoskeletal  (+) Arthritis , Osteoarthritis,  Right knee poly-liner wear, instability Chronic pain: takes percocet '10mg'$  four times a day since 2010   Abdominal  (+) + obese  Peds  Hematology negative hematology ROS (+) hct 43, plt 246   Anesthesia Other Findings   Reproductive/Obstetrics                             Anesthesia Physical Anesthesia Plan  ASA: 3  Anesthesia Plan: Regional and General   Post-op Pain Management: Tylenol PO (pre-op)*, Regional block* and Gabapentin PO (pre-op)*   Induction: Intravenous  PONV Risk Score and Plan: 3 and Ondansetron, Dexamethasone and Treatment may vary due to age or medical condition  Airway Management Planned: Oral ETT and Video Laryngoscope Planned  Additional Equipment: None  Intra-op Plan:   Post-operative Plan: Extubation in OR  Informed Consent: I have reviewed the patients History and Physical, chart, labs and discussed the procedure including the risks, benefits and alternatives for  the proposed anesthesia with the patient or authorized representative who has indicated his/her understanding and acceptance.     Dental advisory given  Plan Discussed with: CRNA  Anesthesia Plan Comments:         Anesthesia Quick Evaluation

## 2022-04-23 ENCOUNTER — Encounter (HOSPITAL_COMMUNITY)
Admission: RE | Disposition: A | Discharge status: Home / Self Care | Payer: Self-pay | Source: Home / Self Care | Attending: Orthopaedic Surgery

## 2022-04-23 ENCOUNTER — Ambulatory Visit (HOSPITAL_COMMUNITY): Payer: Medicare Other

## 2022-04-23 ENCOUNTER — Ambulatory Visit (HOSPITAL_COMMUNITY): Payer: Medicare Other | Admitting: Anesthesiology

## 2022-04-23 ENCOUNTER — Ambulatory Visit (HOSPITAL_BASED_OUTPATIENT_CLINIC_OR_DEPARTMENT_OTHER): Payer: Medicare Other | Admitting: Anesthesiology

## 2022-04-23 ENCOUNTER — Ambulatory Visit (HOSPITAL_COMMUNITY)
Admission: RE | Admit: 2022-04-23 | Discharge: 2022-04-23 | Disposition: A | Discharge status: Home / Self Care | Payer: Medicare Other | Attending: Orthopaedic Surgery | Admitting: Orthopaedic Surgery

## 2022-04-23 DIAGNOSIS — G8929 Other chronic pain: Secondary | ICD-10-CM | POA: Diagnosis not present

## 2022-04-23 DIAGNOSIS — Z6831 Body mass index (BMI) 31.0-31.9, adult: Secondary | ICD-10-CM | POA: Diagnosis not present

## 2022-04-23 DIAGNOSIS — Z79891 Long term (current) use of opiate analgesic: Secondary | ICD-10-CM | POA: Diagnosis not present

## 2022-04-23 DIAGNOSIS — E669 Obesity, unspecified: Secondary | ICD-10-CM | POA: Insufficient documentation

## 2022-04-23 DIAGNOSIS — E119 Type 2 diabetes mellitus without complications: Secondary | ICD-10-CM | POA: Diagnosis not present

## 2022-04-23 DIAGNOSIS — F17210 Nicotine dependence, cigarettes, uncomplicated: Secondary | ICD-10-CM | POA: Diagnosis not present

## 2022-04-23 DIAGNOSIS — I10 Essential (primary) hypertension: Secondary | ICD-10-CM | POA: Diagnosis not present

## 2022-04-23 DIAGNOSIS — Z7984 Long term (current) use of oral hypoglycemic drugs: Secondary | ICD-10-CM | POA: Diagnosis not present

## 2022-04-23 DIAGNOSIS — M19012 Primary osteoarthritis, left shoulder: Secondary | ICD-10-CM | POA: Diagnosis not present

## 2022-04-23 DIAGNOSIS — Z8673 Personal history of transient ischemic attack (TIA), and cerebral infarction without residual deficits: Secondary | ICD-10-CM | POA: Insufficient documentation

## 2022-04-23 DIAGNOSIS — M25512 Pain in left shoulder: Secondary | ICD-10-CM | POA: Diagnosis present

## 2022-04-23 DIAGNOSIS — M12812 Other specific arthropathies, not elsewhere classified, left shoulder: Secondary | ICD-10-CM

## 2022-04-23 DIAGNOSIS — Z01818 Encounter for other preprocedural examination: Secondary | ICD-10-CM

## 2022-04-23 HISTORY — PX: REVERSE SHOULDER ARTHROPLASTY: SHX5054

## 2022-04-23 LAB — GLUCOSE, CAPILLARY
Glucose-Capillary: 120 mg/dL — ABNORMAL HIGH (ref 70–99)
Glucose-Capillary: 98 mg/dL (ref 70–99)

## 2022-04-23 SURGERY — ARTHROPLASTY, SHOULDER, TOTAL, REVERSE
Anesthesia: Regional | Site: Shoulder | Laterality: Left

## 2022-04-23 MED ORDER — BUPIVACAINE HCL (PF) 0.5 % IJ SOLN
INTRAMUSCULAR | Status: DC | PRN
  Administered 2022-04-23: 10 mL via PERINEURAL

## 2022-04-23 MED ORDER — MIDAZOLAM HCL 2 MG/2ML IJ SOLN
INTRAMUSCULAR | Status: AC
  Administered 2022-04-23: 1 mg via INTRAVENOUS
  Filled 2022-04-23: qty 2

## 2022-04-23 MED ORDER — ASPIRIN 325 MG PO TBEC: 325.0000 mg | DELAYED_RELEASE_TABLET | Freq: Every day | ORAL | 0 refills | Status: DC

## 2022-04-23 MED ORDER — OXYCODONE-ACETAMINOPHEN 10-325 MG PO TABS: 1.0000 | ORAL_TABLET | Freq: Four times a day (QID) | ORAL | 0 refills | Status: DC | PRN

## 2022-04-23 MED ORDER — GABAPENTIN 300 MG PO CAPS
ORAL_CAPSULE | ORAL | Status: AC
  Administered 2022-04-23: 300 mg via ORAL
  Filled 2022-04-23: qty 1

## 2022-04-23 MED ORDER — SUGAMMADEX SODIUM 200 MG/2ML IV SOLN
INTRAVENOUS | Status: DC | PRN
  Administered 2022-04-23: 200 mg via INTRAVENOUS

## 2022-04-23 MED ORDER — PROPOFOL 10 MG/ML IV BOLUS
INTRAVENOUS | Status: AC
  Filled 2022-04-23: qty 20

## 2022-04-23 MED ORDER — CHLORHEXIDINE GLUCONATE 0.12 % MT SOLN
OROMUCOSAL | Status: AC
  Administered 2022-04-23: 15 mL via OROMUCOSAL
  Filled 2022-04-23: qty 15

## 2022-04-23 MED ORDER — ACETAMINOPHEN 500 MG PO TABS: 1000.0000 mg | ORAL_TABLET | Freq: Once | ORAL | Status: AC

## 2022-04-23 MED ORDER — CHLORHEXIDINE GLUCONATE 0.12 % MT SOLN: 15.0000 mL | Freq: Once | OROMUCOSAL | Status: AC

## 2022-04-23 MED ORDER — FENTANYL CITRATE (PF) 100 MCG/2ML IJ SOLN
INTRAMUSCULAR | Status: DC | PRN
  Administered 2022-04-23: 100 ug via INTRAVENOUS

## 2022-04-23 MED ORDER — FENTANYL CITRATE (PF) 250 MCG/5ML IJ SOLN
INTRAMUSCULAR | Status: AC
  Filled 2022-04-23: qty 5

## 2022-04-23 MED ORDER — MEPERIDINE HCL 25 MG/ML IJ SOLN: 6.2500 mg | INTRAMUSCULAR | Status: DC | PRN

## 2022-04-23 MED ORDER — DEXAMETHASONE SODIUM PHOSPHATE 4 MG/ML IJ SOLN
INTRAMUSCULAR | Status: DC | PRN
  Administered 2022-04-23: 5 mg via INTRAVENOUS

## 2022-04-23 MED ORDER — MIDAZOLAM HCL 2 MG/2ML IJ SOLN
INTRAMUSCULAR | Status: AC
  Filled 2022-04-23: qty 2

## 2022-04-23 MED ORDER — FENTANYL CITRATE (PF) 100 MCG/2ML IJ SOLN
INTRAMUSCULAR | Status: AC
  Administered 2022-04-23: 50 ug via INTRAVENOUS
  Filled 2022-04-23: qty 2

## 2022-04-23 MED ORDER — ORAL CARE MOUTH RINSE: 15.0000 mL | Freq: Once | OROMUCOSAL | Status: AC

## 2022-04-23 MED ORDER — PROPOFOL 10 MG/ML IV BOLUS
INTRAVENOUS | Status: DC | PRN
  Administered 2022-04-23: 140 mg via INTRAVENOUS

## 2022-04-23 MED ORDER — MIDAZOLAM HCL 2 MG/2ML IJ SOLN: 2.0000 mg | Freq: Once | INTRAMUSCULAR | Status: AC

## 2022-04-23 MED ORDER — PROMETHAZINE HCL 25 MG/ML IJ SOLN: 6.2500 mg | INTRAMUSCULAR | Status: DC | PRN

## 2022-04-23 MED ORDER — LACTATED RINGERS IV SOLN: INTRAVENOUS | Status: DC

## 2022-04-23 MED ORDER — GABAPENTIN 300 MG PO CAPS: 300.0000 mg | ORAL_CAPSULE | Freq: Once | ORAL | Status: AC

## 2022-04-23 MED ORDER — FENTANYL CITRATE (PF) 100 MCG/2ML IJ SOLN: 100.0000 ug | Freq: Once | INTRAMUSCULAR | Status: AC

## 2022-04-23 MED ORDER — TRANEXAMIC ACID-NACL 1000-0.7 MG/100ML-% IV SOLN
1000.0000 mg | INTRAVENOUS | Status: AC
  Administered 2022-04-23: 1000 mg via INTRAVENOUS

## 2022-04-23 MED ORDER — CEFAZOLIN SODIUM-DEXTROSE 2-4 GM/100ML-% IV SOLN
2.0000 g | INTRAVENOUS | Status: AC
  Administered 2022-04-23: 2 g via INTRAVENOUS

## 2022-04-23 MED ORDER — POVIDONE-IODINE 10 % EX SOLN
CUTANEOUS | Status: DC | PRN
  Administered 2022-04-23: 1 via TOPICAL

## 2022-04-23 MED ORDER — BUPIVACAINE LIPOSOME 1.3 % IJ SUSP
INTRAMUSCULAR | Status: DC | PRN
  Administered 2022-04-23: 10 mL via PERINEURAL

## 2022-04-23 MED ORDER — PHENYLEPHRINE HCL-NACL 20-0.9 MG/250ML-% IV SOLN
INTRAVENOUS | Status: DC | PRN
  Administered 2022-04-23: 40 ug/min via INTRAVENOUS

## 2022-04-23 MED ORDER — ACETAMINOPHEN 500 MG PO TABS
ORAL_TABLET | ORAL | Status: AC
  Administered 2022-04-23: 1000 mg via ORAL
  Filled 2022-04-23: qty 2

## 2022-04-23 MED ORDER — ONDANSETRON HCL 4 MG/2ML IJ SOLN
INTRAMUSCULAR | Status: DC | PRN
  Administered 2022-04-23: 4 mg via INTRAVENOUS

## 2022-04-23 MED ORDER — 0.9 % SODIUM CHLORIDE (POUR BTL) OPTIME
TOPICAL | Status: DC | PRN
  Administered 2022-04-23: 1000 mL

## 2022-04-23 MED ORDER — HYDROMORPHONE HCL 1 MG/ML IJ SOLN: 0.2500 mg | INTRAMUSCULAR | Status: DC | PRN

## 2022-04-23 MED ORDER — TRANEXAMIC ACID-NACL 1000-0.7 MG/100ML-% IV SOLN
INTRAVENOUS | Status: AC
  Filled 2022-04-23: qty 100

## 2022-04-23 MED ORDER — VANCOMYCIN HCL 1000 MG IV SOLR
INTRAVENOUS | Status: AC
  Filled 2022-04-23: qty 20

## 2022-04-23 MED ORDER — CEFAZOLIN SODIUM-DEXTROSE 2-4 GM/100ML-% IV SOLN
INTRAVENOUS | Status: AC
  Filled 2022-04-23: qty 100

## 2022-04-23 MED ORDER — ROCURONIUM BROMIDE 100 MG/10ML IV SOLN
INTRAVENOUS | Status: DC | PRN
  Administered 2022-04-23: 20 mg via INTRAVENOUS
  Administered 2022-04-23: 60 mg via INTRAVENOUS

## 2022-04-23 MED ORDER — INSULIN ASPART 100 UNIT/ML IJ SOLN: 0.0000 [IU] | INTRAMUSCULAR | Status: DC | PRN

## 2022-04-23 MED ORDER — BUPIVACAINE HCL (PF) 0.5 % IJ SOLN: INTRAMUSCULAR | Status: DC | PRN

## 2022-04-23 MED ORDER — VANCOMYCIN HCL 1000 MG IV SOLR
INTRAVENOUS | Status: DC | PRN
  Administered 2022-04-23: 1000 mg via TOPICAL

## 2022-04-23 SURGICAL SUPPLY — 72 items
BAG COUNTER SPONGE SURGICOUNT (BAG) ×1 IMPLANT
BASEPLATE GLENOSPHERE 25 STD (Miscellaneous) IMPLANT
BIT DRILL 3.2 PERIPHERAL SCREW (BIT) IMPLANT
BLADE SAW SAG 29X58X.64 (BLADE) IMPLANT
CHLORAPREP W/TINT 26 (MISCELLANEOUS) ×1 IMPLANT
COOLER ICEMAN CLASSIC (MISCELLANEOUS) ×1 IMPLANT
COVER SURGICAL LIGHT HANDLE (MISCELLANEOUS) ×1 IMPLANT
DRAPE IMP U-DRAPE 54X76 (DRAPES) ×1 IMPLANT
DRAPE INCISE IOBAN 66X45 STRL (DRAPES) ×1 IMPLANT
DRAPE U-SHAPE 47X51 STRL (DRAPES) ×2 IMPLANT
DRSG AQUACEL AG ADV 3.5X10 (GAUZE/BANDAGES/DRESSINGS) ×1 IMPLANT
ELECT BLADE 4.0 EZ CLEAN MEGAD (MISCELLANEOUS) ×1
ELECT REM PT RETURN 9FT ADLT (ELECTROSURGICAL) ×1
ELECTRODE BLDE 4.0 EZ CLN MEGD (MISCELLANEOUS) ×1 IMPLANT
ELECTRODE REM PT RTRN 9FT ADLT (ELECTROSURGICAL) ×1 IMPLANT
GAUZE PAD ABD 8X10 STRL (GAUZE/BANDAGES/DRESSINGS) IMPLANT
GAUZE SPONGE 4X4 12PLY STRL (GAUZE/BANDAGES/DRESSINGS) IMPLANT
GLOVE BIO SURGEON STRL SZ7.5 (GLOVE) ×3 IMPLANT
GLOVE BIOGEL PI IND STRL 6.5 (GLOVE) ×1 IMPLANT
GLOVE BIOGEL PI IND STRL 8 (GLOVE) ×2 IMPLANT
GLOVE ECLIPSE 6.0 STRL STRAW (GLOVE) ×1 IMPLANT
GLOVE INDICATOR 8.0 STRL GRN (GLOVE) ×1 IMPLANT
GOWN STRL REUS W/ TWL LRG LVL3 (GOWN DISPOSABLE) ×2 IMPLANT
GOWN STRL REUS W/TWL LRG LVL3 (GOWN DISPOSABLE) ×2
GUIDE PIN 3X75 SHOULDER (PIN) ×2
GUIDEWIRE GLENOID 2.5X220 (WIRE) IMPLANT
HANDPIECE INTERPULSE COAX TIP (DISPOSABLE) ×1
HEAD CANN REV SHLD PERF 33 +3 (Head) IMPLANT
INSERT HUM PERF 1/2 33 +3 (Insert) IMPLANT
KIT BASIN OR (CUSTOM PROCEDURE TRAY) ×1 IMPLANT
KIT STABILIZATION SHOULDER (MISCELLANEOUS) ×1 IMPLANT
KIT TURNOVER KIT B (KITS) ×1 IMPLANT
MANIFOLD NEPTUNE II (INSTRUMENTS) ×1 IMPLANT
NDL HYPO 21X1 ECLIPSE (NEEDLE) ×1 IMPLANT
NDL MAYO TROCAR (NEEDLE) ×1 IMPLANT
NEEDLE HYPO 21X1 ECLIPSE (NEEDLE) ×1 IMPLANT
NEEDLE MAYO TROCAR (NEEDLE) ×1 IMPLANT
NS IRRIG 1000ML POUR BTL (IV SOLUTION) ×1 IMPLANT
PACK SHOULDER (CUSTOM PROCEDURE TRAY) ×1 IMPLANT
PACK UNIVERSAL I (CUSTOM PROCEDURE TRAY) ×1 IMPLANT
PAD ARMBOARD 7.5X6 YLW CONV (MISCELLANEOUS) ×2 IMPLANT
PAD COLD SHLDR WRAP-ON (PAD) ×1 IMPLANT
PIN GUIDE 3X75 SHOULDER (PIN) IMPLANT
RESTRAINT HEAD UNIVERSAL NS (MISCELLANEOUS) ×1 IMPLANT
SCREW 5.0X18 (Screw) IMPLANT
SCREW 5.5X14 (Screw) IMPLANT
SCREW 5.5X22 (Screw) IMPLANT
SCREW 5.5X26 (Screw) IMPLANT
SCREW BONE INTRNL SM 7 (Screw) IMPLANT
SET HNDPC FAN SPRY TIP SCT (DISPOSABLE) ×1 IMPLANT
SLING ARM IMMOBILIZER LRG (SOFTGOODS) ×1 IMPLANT
SPONGE T-LAP 18X18 ~~LOC~~+RFID (SPONGE) ×1 IMPLANT
STAPLER VISISTAT 35W (STAPLE) ×1 IMPLANT
STEM HUMERAL PLUS SHORT SZ2+ (Orthopedic Implant) IMPLANT
SUCTION FRAZIER HANDLE 10FR (MISCELLANEOUS) ×1
SUCTION TUBE FRAZIER 10FR DISP (MISCELLANEOUS) ×1 IMPLANT
SUT ETHILON 3 0 PS 1 (SUTURE) IMPLANT
SUT FIBERWIRE #2 38 REV NDL BL (SUTURE) ×1
SUT FIBERWIRE #5 38 CONV NDL (SUTURE) ×1
SUT VIC AB 0 CT1 27 (SUTURE) ×1
SUT VIC AB 0 CT1 27XBRD ANBCTR (SUTURE) ×2 IMPLANT
SUT VIC AB 2-0 CT1 27 (SUTURE) ×2
SUT VIC AB 2-0 CT1 TAPERPNT 27 (SUTURE) ×2 IMPLANT
SUTURE FIBERWR #5 38 CONV NDL (SUTURE) ×2 IMPLANT
SUTURE FIBERWR#2 38 REV NDL BL (SUTURE) IMPLANT
SYR 50ML LL SCALE MARK (SYRINGE) ×1 IMPLANT
TAPE CLOTH SURG 4X10 WHT LF (GAUZE/BANDAGES/DRESSINGS) IMPLANT
TAPE LABRALWHITE 1.5X36 (TAPE) IMPLANT
TAPE SUT LABRALTAP WHT/BLK (SUTURE) IMPLANT
TOWEL GREEN STERILE (TOWEL DISPOSABLE) ×1 IMPLANT
TRAY FOLEY MTR SLVR 16FR STAT (SET/KITS/TRAYS/PACK) ×1 IMPLANT
WATER STERILE IRR 1000ML POUR (IV SOLUTION) ×1 IMPLANT

## 2022-04-23 NOTE — Interval H&P Note (Signed)
History and Physical Interval Note:  04/23/2022 11:44 AM  Monica Watson  has presented today for surgery, with the diagnosis of LEFT SHOULDER OSTEOARTHRITIS.  The various methods of treatment have been discussed with the patient and family. After consideration of risks, benefits and other options for treatment, the patient has consented to  Procedure(s): LEFT REVERSE SHOULDER ARTHROPLASTY (Left) as a surgical intervention.  The patient's history has been reviewed, patient examined, no change in status, stable for surgery.  I have reviewed the patient's chart and labs.  Questions were answered to the patient's satisfaction.     Vanetta Mulders

## 2022-04-23 NOTE — Anesthesia Procedure Notes (Signed)
Anesthesia Regional Block: Interscalene brachial plexus block   Pre-Anesthetic Checklist: , timeout performed,  Correct Patient, Correct Site, Correct Laterality,  Correct Procedure, Correct Position, site marked,  Risks and benefits discussed,  Surgical consent,  Pre-op evaluation,  At surgeon's request and post-op pain management  Laterality: Upper and Left  Prep: chloraprep       Needles:  Injection technique: Single-shot  Needle Type: Stimulator Needle - 40     Needle Length: 4cm  Needle Gauge: 22     Additional Needles:   Procedures:,,,, ultrasound used (permanent image in chart),,    Narrative:  Start time: 04/23/2022 11:31 AM End time: 04/23/2022 11:51 AM Injection made incrementally with aspirations every 5 mL.  Performed by: Personally  Anesthesiologist: Nolon Nations, MD  Additional Notes: BP cuff, SpO2 and EKG monitors applied. Sedation begun. Nerve location verified with ultrasound. Anesthetic injected incrementally, slowly, and after neg aspirations under direct u/s guidance. Good perineural spread. Tolerated well.

## 2022-04-23 NOTE — Transfer of Care (Signed)
Immediate Anesthesia Transfer of Care Note  Patient: Monica Watson  Procedure(s) Performed: LEFT REVERSE SHOULDER ARTHROPLASTY (Left: Shoulder)  Patient Location: PACU  Anesthesia Type:General and Regional  Level of Consciousness: awake and patient cooperative  Airway & Oxygen Therapy: Patient Spontanous Breathing and Patient connected to face mask oxygen  Post-op Assessment: Report given to RN, Post -op Vital signs reviewed and stable, and Patient moving all extremities  Post vital signs: Reviewed and stable  Last Vitals:  Vitals Value Taken Time  BP 150/103 04/23/22 1430  Temp 36.3 C 04/23/22 1430  Pulse 83 04/23/22 1433  Resp 17 04/23/22 1433  SpO2 100 % 04/23/22 1433  Vitals shown include unvalidated device data.  Last Pain:  Vitals:   04/23/22 1013  TempSrc:   PainSc: 0-No pain         Complications: No notable events documented.

## 2022-04-23 NOTE — Op Note (Signed)
Date of Surgery: 04/23/2022  INDICATIONS: Monica Watson is a 66 y.o.-year-old female with left rotator cuff arthropathy which is failed conservative management.  The risk and benefits of the procedure were discussed in detail and documented in the pre-operative evaluation.   PREOPERATIVE DIAGNOSIS: 1.  Left shoulder rotator cuff arthropathy  POSTOPERATIVE DIAGNOSIS: Same.  PROCEDURE: 1.  Left shoulder reverse shoulder arthroplasty 2.  Left shoulder biceps tenodesis   SURGEON: Yevonne Pax MD  ASSISTANT: Raynelle Fanning, ATC  ANESTHESIA:  general plus interscalene nerve block  IV FLUIDS AND URINE: See anesthesia record.  ANTIBIOTICS: Ancef  ESTIMATED BLOOD LOSS: 50 mL.  IMPLANTS:  Implant Name Type Inv. Item Serial No. Manufacturer Lot No. LRB No. Used Action  SCREW BONE INTRNL SM 7 - [id number] Screw SCREW BONE INTRNL SM 7 [id number] [employer]  Left 1 Implanted  BASEPLATE GLENOSPHERE 25 STD - [id number] Miscellaneous BASEPLATE GLENOSPHERE 25 STD [id number] [employer]  Left 1 Implanted  TORNIER PERFORM REVERSED CANNULATED COCR LATERALIZED GLENOSHERE   [id number] [employer]  Left 1 Implanted  SCREW 5.5X14 - [id number] Screw SCREW 5.5X14  [employer] ON TRAY Left 1 Implanted  SCREW 5.5X26 - [id number] Screw SCREW 5.5X26  [employer] ON TRAY Left 1 Implanted  SCREW 5.0X18 - [id number] Screw SCREW 5.0X18  [employer] ON TRAY Left 1 Implanted  SCREW 5.5X22 - [id number] Screw SCREW 5.5X22  [employer] ON TRAY Left 1 Implanted  STEM HUMERAL PLUS SHORT SZ2+ - [id number] Orthopedic Implant STEM HUMERAL PLUS SHORT SZ2+ [id number] [employer]  Left 1 Implanted  TORNIER PERFORM HUMERAL SYSTEM RETENTIVE REVERSED INSERT +3   [id number] [employer]  Left 1 Implanted    DRAINS: None  CULTURES: None  COMPLICATIONS: none  DESCRIPTION OF PROCEDURE:  Patient was identified in the preoperative holding area.  Anesthesia performed an interscalene nerve block after universal  timeout was performed with nursing.  Ancef was given 1 hour prior to skin incision.    The surgical site was scrubbed with a chlorhexidine scrub brush and alcohol.  The patient was then prepped with chlorhexidine skin prep.  The patient was subsequently taken back to the operating room.  Anesthesia was induced.  He was transferred to the beachchair position.  All bony prominences were padded.  Final timeout was again performed.     The bony landmarks of the shoulder were marked with a marking pen. A delto-pectoral incision was made, extending up approximately 5 inches. The wound with then irrigated with dilute betadine. Cephalic vein was identified, and an protected. This was retracted medially. Subdeltoid and subpectoral lesions were released. Neurovascular structures were carefully protected. The Gelpi retractor was used to retract the deltoid and pectoralis major. A 1 cm release was performed on the upper pectoralis.   The deltoid was retracted laterally with a Brown humeral retractor.  The conjoined tendon was identified. The cleido-pectoral fascia was excised.  The axillary nerve was palpated and carefully protected throughout the procedure. The biceps tendon was found and tenodesed to the upper pec with # 2 FiberWire.  Proximally the biceps tendon was removed up to the joint.  The bicipital groove was used for a landmark to establish rotator cuff interval. The subscap was tagged with a #2 FiberWire.  At this point the subscap was peeled off from the lesser tuberosity with care to avoid dissection distally in order to protect the axillary nerve.  Once the joint was exposed the proximal humerus was delivered with external rotation and extension of  the arm. The humerus was prepped initially by performing a humeral neck cut. This was done with the guide using 20 degrees of retroversion as a reference.  The head portion was removed.  A medullary sounding reamer was then used.  We subsequently placed our  guidewire through the center of the humeral head using the reference guide.  This was a size 2.  Metaphyseal reamer was then used.  Finally the size 2+ broach was malleted into place with excellent purchase.  A tonsil clamp was used to attempt to pull this out with very good purchase   Attention was then turned to the glenoid.  Posteriorly a large Darach retractor was used.  A 360 Degree release of the subscapularis and glenoid were done. The capsule was released from the humerus.    Glenoid retractors were placed posteriorly, superiorly behind the biceps tendon and anteriorly on the glenoid neck. A 360-degree release of the capsule was performed with cautery.  The triceps was released off the inferior tubercle of the glenoid. The axillary nerve was carefully protected with the surgeon's index finger, retracting it and using cautery.   A guidepin was placed through the glenoid guide. The guidepin was drilled until it exited the cortex. The guidepin was over drilled. Next, the glenoid was prepared with the reamer  down to cortical bone.  The central peg hole was totally within the scapular neck tested with the probe.  The baseplate was then placed securely with good purchase in position and then secured with 4 screws. In each case, they were drilled and measured and the appropriate length screw placed with excellent rigid fixation of the baseplate.    A +3 liner was then used with the appropriate broach.  This was brought to just the level of the reduction but not completely reduced.  A +3 retentive final poly was selected and impacted.    Appropriate tension was noted on the conjoined tendon and deltoid muscle.  Extension was stable, external and internal rotation as well.  The subscap was pulled over but as this was not able to reach comfortably decision was made not to repair in order to prevent limited in external rotation.  The wound was then irrigated. Vancomycin powder was placed in the wound again for  infection prevention.   The wound was then closed in layers with 0 Vicryl interrupted in the deep subcu followed by 2-0 Vicryl in the superficial subcu and 3-0 nylon for skin.  An Aquacel dressing was applied as well as an Naval architect.  A shoulder immobilizer was applied.          POSTOPERATIVE PLAN: She will be placed in a sling on the left arm pending physical therapy.  She will begin early active forward elevation.  She will follow-up in 2 weeks for suture removal.  She will be placed on aspirin for anticoagulation.  Yevonne Pax, MD 2:17 PM

## 2022-04-23 NOTE — Anesthesia Procedure Notes (Signed)
Procedure Name: Intubation Date/Time: 04/23/2022 12:38 PM  Performed by: Elvin So, CRNAPre-anesthesia Checklist: Patient identified, Emergency Drugs available, Suction available and Patient being monitored Patient Re-evaluated:Patient Re-evaluated prior to induction Oxygen Delivery Method: Circle System Utilized Preoxygenation: Pre-oxygenation with 100% oxygen Induction Type: IV induction Ventilation: Mask ventilation without difficulty Laryngoscope Size: Glidescope and 4 Grade View: Grade I Tube type: Oral Number of attempts: 1 Airway Equipment and Method: Stylet and Oral airway Placement Confirmation: ETT inserted through vocal cords under direct vision, positive ETCO2 and breath sounds checked- equal and bilateral Secured at: 22 cm Tube secured with: Tape Dental Injury: Teeth and Oropharynx as per pre-operative assessment

## 2022-04-23 NOTE — Anesthesia Postprocedure Evaluation (Signed)
Anesthesia Post Note  Patient: Monica Watson  Procedure(s) Performed: LEFT REVERSE SHOULDER ARTHROPLASTY (Left: Shoulder)     Patient location during evaluation: PACU Anesthesia Type: General Level of consciousness: sedated and patient cooperative Pain management: pain level controlled Vital Signs Assessment: post-procedure vital signs reviewed and stable Respiratory status: spontaneous breathing Cardiovascular status: stable Anesthetic complications: no   No notable events documented.  Last Vitals:  Vitals:   04/23/22 1445 04/23/22 1500  BP: (!) 130/110 (!) 141/89  Pulse: 80 78  Resp: 20 14  Temp:  (!) 36.3 C  SpO2: 100% 97%    Last Pain:  Vitals:   04/23/22 1500  TempSrc:   PainSc: 0-No pain                 Nolon Nations

## 2022-04-23 NOTE — H&P (Signed)
Chief Complaint: Bilateral shoulder pain        History of Present Illness:    02/07/2022: Presents today for follow-up of her left shoulder.  Unfortunately she did not get any type of relief from her injection.  She is continue to have significant pain with laying directly on the side.  She has pain with any overhead activity which is dramatically limiting very basic activities of daily living.  She is here today for further assessment.     Cyrstal Leitz is a 66 y.o. female left-hand-dominant female presents with bilateral shoulder pain left worse than right for the last several months.  She states that she does have a history of bilateral debridement arthroscopically in both of her shoulders many years prior.  She does not remember specific dates.  She states that she did get relief of her shoulders following this.  That being said for the last several months her shoulder pain has recurred.  She is having pain with overhead activity and overhead motion.  She has not had any injections or physical therapy.  She is currently disabled from her back and arthritis of her knees.  She did have a history of a stroke which is left her with left-sided weakness.  She is not having difficulty laying directly on the left side     Surgical History:   Previous bilateral shoulder debridements done many years prior arthroscopically   PMH/PSH/Family History/Social History/Meds/Allergies:         Past Medical History:  Diagnosis Date   Arthritis     Cerebrovascular disease     Depression     Hypertension     Stroke ([place])     Type 2 diabetes mellitus ([place])           Past Surgical History:  Procedure Laterality Date   bilateral knee replacements        bilateral shoulder replacement       I & D KNEE WITH POLY EXCHANGE Right 12/16/2020    Procedure: POLY EXCHANGE RIGHT KNEE;  Surgeon: [name], MD;  Location: [facility];  Service: Orthopedics;  Laterality: Right;   left  carpal tunnel release        THYROIDECTOMY        Social History         Socioeconomic History   Marital status: Single      Spouse name: Not on file   Number of children: Not on file   Years of education: Not on file   Highest education level: Not on file  Occupational History   Not on file  Tobacco Use   Smoking status: Every Day      Packs/day: 0.50      Years: 46.00      Total pack years: 23.00      Types: Cigarettes   Smokeless tobacco: Never  Vaping Use   Vaping Use: Never used  Substance and Sexual Activity   Alcohol use: Never   Drug use: Never   Sexual activity: Not on file  Other Topics Concern   Not on file  Social History Narrative    Retired     Right handed    Social Determinants of Health        Financial Resource Strain: Not on file  Food Insecurity: Not on file  Transportation Needs: No Transportation Needs (09/22/2021)    PRAPARE - Armed forces logistics/support/administrative officer (Medical): No  Lack of Transportation (Non-Medical): No  Physical Activity: Not on file  Stress: Not on file  Social Connections: Not on file    No family history on file. No Known Allergies       Current Outpatient Medications  Medication Sig Dispense Refill   atorvastatin (LIPITOR) 20 MG tablet TAKE 1 TABLET(20 MG) BY MOUTH DAILY (Patient not taking: Reported on 07/28/2021) 60 tablet 0   ciclopirox (PENLAC) 8 % solution Apply one coat to toenail qd.  Remove weekly with polish remover. 6.6 mL 11   fluticasone (FLONASE) 50 MCG/ACT nasal spray Place 2 sprays into both nostrils daily. (Patient not taking: Reported on 07/28/2021) 16 g 6   gabapentin (NEURONTIN) 800 MG tablet Take 1 tablet (800 mg total) by mouth at bedtime. 30 tablet 0   Glycopyrronium Tosylate 2.4 % PADS Apply 1 each topically daily as needed (sweating). (Patient not taking: Reported on 01/09/2021) 30 each 0   losartan (COZAAR) 50 MG tablet TAKE 1 TABLET(50 MG) BY MOUTH EVERY DAY FOR BLOOD PRESSURE 30 tablet 1    Miconazole Nitrate (LOTRIMIN AF DEODORANT POWDER) 2 % AERP Spray between toes once daily. 128 g 0   oxyCODONE-acetaminophen (PERCOCET) 10-325 MG tablet Take 1 tablet by mouth every 6 (six) hours as needed for pain. 30 tablet 0   polyethylene glycol (MIRALAX) 17 g packet Take 17 g by mouth daily. 90 each 0   psyllium (METAMUCIL) 28 % packet Take 1 packet by mouth 2 (two) times daily. 30 each 1   tiZANidine (ZANAFLEX) 4 MG tablet Take 1 tablet (4 mg total) by mouth every 8 (eight) hours as needed for muscle spasms. (Patient not taking: Reported on 07/28/2021) 30 tablet 0    No current facility-administered medications for this visit.    No results found.   Review of Systems:   A ROS was performed including pertinent positives and negatives as documented in the HPI.   Physical Exam :   Constitutional: NAD and appears stated age Neurological: Alert and oriented Psych: Appropriate affect and cooperative There were no vitals taken for this visit.    Comprehensive Musculoskeletal Exam:     Musculoskeletal Exam      Inspection Right Left  Skin No atrophy or winging No atrophy or winging  Palpation      Tenderness Glenohumeral Glenohumeral  Range of Motion      Flexion (passive) 150 130  Flexion (active) 150 130  Abduction 150 90  ER at the side 70 25  Can reach behind back to T12 L3  Strength        Full with pain Full with pain  Special Tests      Pseudoparalytic No No  Neurologic      Fires PIN, radial, median, ulnar, musculocutaneous, axillary, suprascapular, long thoracic, and spinal accessory innervated muscles. No abnormal sensibility  Vascular/Lymphatic      Radial Pulse 2+ 2+  Cervical Exam      Patient has symmetric cervical range of motion with negative Spurling's test.  Special Test: Positive crepitus bilaterally        Imaging:   Xray (3 views left shoulder): There is glenohumeral moderate osteoarthritis     I personally reviewed and interpreted the radiographs.      Assessment:   65 y.o. 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  Unfortunately she not get any type of relief from her previous injection of the left shoulder.  To that effect I do not believe that physical  therapy would help in this I believe that this would irritate her arthritis.  We did discuss the role for surgical intervention given this.  At this time I do believe that she would be a candidate for reverse shoulder arthroplasty.  We did discuss the specific limitations and complications that can come with this.  She is hoping to proceed with this as she has now had 3 injections without any relief.  She will need to wait 3 months following of July when she had her last injection prior to surgery in order to minimize the risk of infection. Plan :     -Plan for left shoulder reverse shoulder arthroplasty     After a lengthy discussion of treatment options, including risks, benefits, alternatives, complications of surgical and nonsurgical conservative options, the patient elected surgical repair.    The patient  is aware of the material risks  and complications including, but not limited to injury to adjacent structures, neurovascular injury, infection, numbness, bleeding, implant failure, thermal burns, stiffness, persistent pain, failure to heal, disease transmission from allograft, need for further surgery, dislocation, anesthetic risks, blood clots, risks of death,and others. The probabilities of surgical success and failure discussed with patient given their particular co-morbidities.The time and nature of expected rehabilitation and recovery was discussed.The patient's questions were all answered preoperatively.  No barriers to understanding were noted. I explained the natural history of the disease process and Rx rationale.  I explained to the patient what I considered to be reasonable expectations given their personal situation.  The final treatment  plan was arrived at through a shared patient decision making process model.         I personally saw and evaluated the patient, and participated in the management and treatment plan.   Vanetta Mulders, MD Attending Physician, Orthopedic Surgery   This document was dictated using Dragon voice recognition software. A reasonable attempt at proof reading has been made to minimize errors.

## 2022-04-23 NOTE — Brief Op Note (Signed)
   Brief Op Note  Date of Surgery: 04/23/2022  Preoperative Diagnosis: LEFT SHOULDER OSTEOARTHRITIS  Postoperative Diagnosis: same  Procedure: Procedure(s): LEFT REVERSE SHOULDER ARTHROPLASTY  Implants: Implant Name Type Inv. Item Serial No. Manufacturer Lot No. LRB No. Used Action  SCREW BONE INTRNL SM 7 - [id number] Screw SCREW BONE INTRNL SM 7 [id number] [employer]  Left 1 Implanted  BASEPLATE GLENOSPHERE 25 STD - [id number] Miscellaneous BASEPLATE GLENOSPHERE 25 STD [id number] [employer]  Left 1 Implanted  TORNIER PERFORM REVERSED CANNULATED COCR LATERALIZED GLENOSHERE   [id number] [employer]  Left 1 Implanted  SCREW 5.5X14 - [id number] Screw SCREW 5.5X14  [employer] ON TRAY Left 1 Implanted  SCREW 5.5X26 - [id number] Screw SCREW 5.5X26  [employer] ON TRAY Left 1 Implanted  SCREW 5.0X18 - [id number] Screw SCREW 5.0X18  [employer] ON TRAY Left 1 Implanted  SCREW 5.5X22 - [id number] Screw SCREW 5.5X22  [employer] ON TRAY Left 1 Implanted  STEM HUMERAL PLUS SHORT SZ2+ - [id number] Orthopedic Implant STEM HUMERAL PLUS SHORT SZ2+ [id number] [employer]  Left 1 Implanted  TORNIER PERFORM HUMERAL SYSTEM RETENTIVE REVERSED INSERT +3   [id number] [employer]  Left 1 Implanted    Surgeons: Surgeon(s): Vanetta Mulders, MD  Anesthesia: General    Estimated Blood Loss: See anesthesia record  Complications: None  Condition to PACU: Stable  Yevonne Pax, MD 04/23/2022 2:17 PM

## 2022-04-23 NOTE — Discharge Instructions (Signed)
     Discharge Instructions    Attending Surgeon: Vanetta Mulders, MD Office Phone Number: [phone]   Diagnosis and Procedures:    Surgeries Performed: Left shoulder arthroplasty  Discharge Plan:    Diet: Resume usual diet. Begin with light or bland foods.  Drink plenty of fluids.  Activity:  Keep sling and dressing in place until your follow up visit in Physical Therapy You are advised to go home directly from the hospital or surgical center. Restrict your activities.  GENERAL INSTRUCTIONS: 1.  Keep your surgical site elevated above your heart for at least 5-7 days or longer to prevent swelling. This will improve your comfort and your overall recovery following surgery.     2. Please call Dr. Eddie Dibbles office at [phone] with questions Monday-Friday during business hours. If no one answers, please leave a message and someone should get back to the patient within 24 hours. For emergencies please call 911 or proceed to the emergency room.   3. Patient to notify surgical team if experiences any of the following: Bowel/Bladder dysfunction, uncontrolled pain, nerve/muscle weakness, incision with increased drainage or redness, nausea/vomiting and Fever greater than 101.0 F.  Be alert for signs of infection including redness, streaking, odor, fever or chills. Be alert for excessive pain or bleeding and notify your surgeon immediately.  WOUND INSTRUCTIONS:   Leave your dressing/cast/splint in place until your post operative visit.  Keep it clean and dry.  Always keep the incision clean and dry until the staples/sutures are removed. If there is no drainage from the incision you should keep it open to air. If there is drainage from the incision you must keep it covered at all times until the drainage stops  Do not soak in a bath tub, hot tub, pool, lake or other body of water until 21 days after your surgery and your incision is completely dry and healed.  If you have removable  sutures (or staples) they must be removed 10-14 days (unless otherwise instructed) from the day of your surgery.     1)  Elevate the extremity as much as possible.  2)  Keep the dressing clean and dry.  3)  Please call us if the dressing becomes wet or dirty.  4)  If you are experiencing worsening pain or worsening swelling, please call.     MEDICATIONS: Resume all previous home medications at the previous prescribed dose and frequency unless otherwise noted Start taking the  pain medications on an as-needed basis as prescribed  Please taper down pain medication over the next week following surgery.  Ideally you should not require a refill of any narcotic pain medication.  Take pain medication with food to minimize nausea. In addition to the prescribed pain medication, you may take over-the-counter pain relievers such as Tylenol.  Do NOT take additional tylenol if your pain medication already has tylenol in it.  Aspirin '325mg'$  daily for four weeks.      FOLLOWUP INSTRUCTIONS: 1. Follow up at the Physical Therapy Clinic 3-4 days following surgery. This appointment should be scheduled unless other arrangements have been made.The Physical Therapy scheduling number is [phone] if an appointment has not already been arranged.  2. Contact Dr. Eddie Dibbles office during office hours at [phone] or the practice after hours line at [phone] for non-emergencies. For medical emergencies call 911.   Discharge Location: Home

## 2022-04-25 ENCOUNTER — Encounter (HOSPITAL_COMMUNITY): Payer: Self-pay | Admitting: Orthopaedic Surgery

## 2022-04-26 ENCOUNTER — Encounter (HOSPITAL_BASED_OUTPATIENT_CLINIC_OR_DEPARTMENT_OTHER): Payer: Self-pay | Admitting: Physical Therapy

## 2022-04-26 ENCOUNTER — Ambulatory Visit (HOSPITAL_BASED_OUTPATIENT_CLINIC_OR_DEPARTMENT_OTHER): Payer: Medicare Other | Attending: Orthopaedic Surgery | Admitting: Physical Therapy

## 2022-04-26 ENCOUNTER — Other Ambulatory Visit: Payer: Self-pay

## 2022-04-26 DIAGNOSIS — M19011 Primary osteoarthritis, right shoulder: Secondary | ICD-10-CM | POA: Insufficient documentation

## 2022-04-26 DIAGNOSIS — M25512 Pain in left shoulder: Secondary | ICD-10-CM | POA: Diagnosis present

## 2022-04-26 DIAGNOSIS — M19012 Primary osteoarthritis, left shoulder: Secondary | ICD-10-CM | POA: Diagnosis not present

## 2022-04-26 DIAGNOSIS — M25612 Stiffness of left shoulder, not elsewhere classified: Secondary | ICD-10-CM | POA: Insufficient documentation

## 2022-04-26 DIAGNOSIS — M6281 Muscle weakness (generalized): Secondary | ICD-10-CM | POA: Insufficient documentation

## 2022-04-26 NOTE — Therapy (Signed)
OUTPATIENT PHYSICAL THERAPY SHOULDER EVALUATION   Patient Name: [name] MRN: [id number] DOB:1956/01/08, 66 y.o., female Today's Date: 04/26/2022   PT End of Session - 04/26/22 1402     Visit Number 1    Number of Visits 25    Date for PT Re-Evaluation 07/20/22    Authorization Type MCR    Progress Note Due on Visit 10    PT Start Time 8937    PT Stop Time 1425    PT Time Calculation (min) 40 min    Activity Tolerance Patient tolerated treatment well    Behavior During Therapy WFL for tasks assessed/performed             Past Medical History:  Diagnosis Date   Arthritis    Cerebrovascular disease    Depression    Headache    Hypertension    Stroke ([place]) 2010   Type 2 diabetes mellitus ([place])    Past Surgical History:  Procedure Laterality Date   bilateral knee replacements      CATARACT EXTRACTION, BILATERAL     FRACTURE SURGERY Right    arm as a child   gallstones removed     I & D KNEE WITH POLY EXCHANGE Right 12/16/2020   Procedure: POLY EXCHANGE RIGHT KNEE;  Surgeon: [name], MD;  Location: [facility];  Service: Orthopedics;  Laterality: Right;   KNEE ARTHROSCOPY Bilateral    left carpal tunnel release      REVERSE SHOULDER ARTHROPLASTY Left 04/23/2022   Procedure: LEFT REVERSE SHOULDER ARTHROPLASTY;  Surgeon: Vanetta Mulders, MD;  Location: [place];  Service: Orthopedics;  Laterality: Left;   SHOULDER ARTHROSCOPY Bilateral    THYROIDECTOMY     Patient Active Problem List   Diagnosis Date Noted   Rotator cuff arthropathy of left shoulder 04/23/2022   Type 2 diabetes mellitus without complication, without long-term current use of insulin ([place]) 03/20/2022   Neck mass 03/20/2022   Osteoarthritis 02/19/2022   Depressed mood 02/19/2022   Abnormal uterine bleeding (AUB) 02/08/2022   Internal hemorrhoids 07/28/2021   Viral URI with cough 06/29/2021   Cervical lymphadenopathy 03/11/2021   Status post revision of total replacement of right  knee 12/16/2020   Polyethylene wear of right knee prosthesis, 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/15/2020   Sialadenitis 11/23/2020   Abdominal pain 10/23/2020   Cervical cancer screening 08/10/2020   Bartholin cyst 07/29/2020   History of total knee replacement, left 06/13/2020   History of total knee arthroplasty, right 06/13/2020   Acute right-sided back pain 06/09/2020   Depression, major, single episode, mild ([place]) 05/12/2020   Vision changes 12/31/2019   Bilateral knee pain 10/08/2019   Poor dentition 10/08/2019   Healthcare maintenance 10/08/2019   Chronic left shoulder pain 08/31/2019   Hemorrhoids 04/04/2019   History of stroke 01/01/2019   Hypertension associated with diabetes ([place]) 01/01/2019   Pre-diabetes 01/01/2019   Hot flashes 01/01/2019   Onychomycosis 01/01/2019     REFERRING PROVIDER:  Vanetta Mulders, MD    REFERRING DIAG: M19.011,M19.012 (ICD-10-CM) - Osteoarthritis of bilateral glenohumeral joints  s/p Lt RSA without subscap repair  THERAPY DIAG:  Acute pain of left shoulder  Muscle weakness (generalized)  Stiffness of left shoulder, not elsewhere classified  Rationale for Evaluation and Treatment: Rehabilitation  ONSET DATE: DOS 04/23/22  SUBJECTIVE:  SUBJECTIVE STATEMENT: Numbness to my hand. Trying to do some exercises.  PERTINENT HISTORY: HTN, h/o CVA  PAIN:  Are you having pain? Yes: NPRS scale: 7/10 Pain location: Lt shoulder Pain description: sore Aggravating factors: movement, constant Relieving factors: meds  PRECAUTIONS: Other: RTSA  WEIGHT BEARING RESTRICTIONS: Yes UE  FALLS:  Has patient fallen in last 6 months? No  PLOF: Independent  PATIENT GOALS:decrease pain, use arm  OBJECTIVE:   COGNITION: Overall cognitive status: Difficulty to assess due  to: recent medication     SENSATION: Numbness in hand reported  POSTURE: GHJ IR as expected in sling, notable bruising on medial UE  UPPER EXTREMITY ROM:   Able to achieve 90 deg with empty end feel and reported discomfort   UPPER EXTREMITY MMT:  MMT Right eval Left eval  Shoulder flexion    Shoulder extension    Shoulder abduction    Shoulder adduction    Shoulder internal rotation    Shoulder external rotation    Middle trapezius    Lower trapezius    Elbow flexion    Elbow extension    Wrist flexion    Wrist extension    Wrist ulnar deviation    Wrist radial deviation    Wrist pronation    Wrist supination    Grip strength (lbs)    (Blank rows = not tested)  PALPATION:  Edema around shoulder as expected, smooth motion, spasm in periscapular region   TODAY'S TREATMENT:                                                                                                                                          Treatment                            EVAL 04/26/22:  PT changed bandages- 1 gauze required use of sterile saline to remove due to scabbing holding gauze to incision site in proximal 1/3 of incision. Scap retraction Pendulums Biceps curls Putty grip & manipulation Returned to sling and fit properly   PATIENT EDUCATION: Education details: Geophysicist/field seismologist of condition, POC, HEP, exercise form/rationale Person educated: Patient Education method: Explanation, Demonstration, Tactile cues, Verbal cues, and Handouts Education comprehension: verbalized understanding, returned demonstration, verbal cues required, tactile cues required, and needs further education  HOME EXERCISE PROGRAM: [id number]  ASSESSMENT:  CLINICAL IMPRESSION: Patient is a 66 y.o. F who was seen today for physical therapy evaluation and treatment for Lt Rev TSA.   OBJECTIVE IMPAIRMENTS: decreased activity tolerance, decreased knowledge of condition, decreased ROM, decreased strength, increased edema,  increased muscle spasms, impaired flexibility, impaired sensation, impaired UE functional use, postural dysfunction, and pain.   ACTIVITY LIMITATIONS: carrying, lifting, sleeping, bed mobility, bathing, dressing, reach over head, and hygiene/grooming  PARTICIPATION LIMITATIONS: meal prep, cleaning, laundry, driving, and community activity  PERSONAL FACTORS: 1-2 comorbidities: HTN, h/o  CVA  are also affecting patient's functional outcome.   REHAB POTENTIAL: Good  CLINICAL DECISION MAKING: Stable/uncomplicated  EVALUATION COMPLEXITY: Low   GOALS: Goals reviewed with patient? Yes  SHORT TERM GOALS: Target date: 05/24/22  1.  Active ER to 30 deg in standing without shoulder elevation Baseline: unable at eval Goal status: INITIAL  2.  AROM Elevation to at least 90 deg with good scapular control Baseline: see chart Goal status: INITIAL 3.  Passive flexion to 130 Baseline: see chart Goal status: INITIAL 4.  Able to demo full elbow AROM without incr pain Baseline: elbow and wrist motions begun at eval Goal status: INITIAL   LONG TERM GOALS:  Fire all heads of deltoid Baseline: unable at eval Goal status: INITIAL Target date: 05/24/2022  (4wk)  2.  Active shoulder elevation to 130 Baseline: see chart Goal status: INITIAL Target date: 06/07/2022  (6wk)  3.  Average pain <=3/10 in daily activities out of sling, aware of when to take rest breaks for pain managemenet Baseline: in sling at eval Goal status: INITIAL Target date: 06/07/2022  (6wk)  4.  Demo ability to reach light objects, such as empty dishes, into cabinets above shoulder height with involved UE Baseline: unable at eval Goal status: INITIAL Target date: 07/05/2022  (10 wk)  5.  Able to progress weight lifting tolerance to 10lb pain <=3/10 Baseline: will progress as appropriate Goal status: INITIAL Target date: 07/05/2022  (10 wk)  6.  Able to demo at least 75% strength via hand held dynamometry testing in  straight plane motions compared to opp UE Baseline: unable to test at eval Goal status: INITIAL Target date: 07/19/2022  (12 wk)   PLAN:  PT FREQUENCY: 1-2x/week  PT DURATION: 12 weeks  PLANNED INTERVENTIONS: Therapeutic exercises, Therapeutic activity, Neuromuscular re-education, Patient/Family education, Self Care, Joint mobilization, Dry Needling, Electrical stimulation, Spinal mobilization, Cryotherapy, Moist heat, scar mobilization, Taping, Traction, Manual therapy, and Re-evaluation  PLAN FOR NEXT SESSION: continue per protocol (paper copy provided to patient   Don Giarrusso C. Marilena Trevathan PT, DPT 04/26/22 7:41 PM

## 2022-05-02 ENCOUNTER — Ambulatory Visit: Payer: Medicare Other | Attending: Family Medicine

## 2022-05-02 DIAGNOSIS — M6281 Muscle weakness (generalized): Secondary | ICD-10-CM | POA: Insufficient documentation

## 2022-05-02 DIAGNOSIS — M25612 Stiffness of left shoulder, not elsewhere classified: Secondary | ICD-10-CM | POA: Diagnosis present

## 2022-05-02 DIAGNOSIS — M25512 Pain in left shoulder: Secondary | ICD-10-CM | POA: Diagnosis not present

## 2022-05-02 NOTE — Therapy (Signed)
OUTPATIENT PHYSICAL THERAPY TREATMENT NOTE   Patient Name: Monica Watson MRN: [id number] DOB:October 06, 1955, 66 y.o., female Today's Date: 05/02/2022  PCP: Sharion Settler, DO  REFERRING PROVIDER: Vanetta Mulders, MD   END OF SESSION:   PT End of Session - 05/02/22 1305     Visit Number 2    Number of Visits 25    Date for PT Re-Evaluation 07/20/22    Authorization Type MCR    Authorization Time Period FOTO v6, v10, kx mod v15    Progress Note Due on Visit 10    PT Start Time 1305    PT Stop Time 1343    PT Time Calculation (min) 38 min    Activity Tolerance Patient tolerated treatment well    Behavior During Therapy WFL for tasks assessed/performed             Past Medical History:  Diagnosis Date   Arthritis    Cerebrovascular disease    Depression    Headache    Hypertension    Stroke ([place]) 2010   Type 2 diabetes mellitus ([place])    Past Surgical History:  Procedure Laterality Date   bilateral knee replacements      CATARACT EXTRACTION, BILATERAL     FRACTURE SURGERY Right    arm as a child   gallstones removed     I & D KNEE WITH POLY EXCHANGE Right 12/16/2020   Procedure: POLY EXCHANGE RIGHT KNEE;  Surgeon: [name], MD;  Location: [facility];  Service: Orthopedics;  Laterality: Right;   KNEE ARTHROSCOPY Bilateral    left carpal tunnel release      REVERSE SHOULDER ARTHROPLASTY Left 04/23/2022   Procedure: LEFT REVERSE SHOULDER ARTHROPLASTY;  Surgeon: Vanetta Mulders, MD;  Location: [facility];  Service: Orthopedics;  Laterality: Left;   SHOULDER ARTHROSCOPY Bilateral    THYROIDECTOMY     Patient Active Problem List   Diagnosis Date Noted   Rotator cuff arthropathy of left shoulder 04/23/2022   Type 2 diabetes mellitus without complication, without long-term current use of insulin ([place]) 03/20/2022   Neck mass 03/20/2022   Osteoarthritis 02/19/2022   Depressed mood 02/19/2022   Abnormal uterine bleeding (AUB) 02/08/2022   Internal  hemorrhoids 07/28/2021   Viral URI with cough 06/29/2021   Cervical lymphadenopathy 03/11/2021   Status post revision of total replacement of right knee 12/16/2020   Polyethylene wear of right knee prosthesis, 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/15/2020   Sialadenitis 11/23/2020   Abdominal pain 10/23/2020   Cervical cancer screening 08/10/2020   Bartholin cyst 07/29/2020   History of total knee replacement, left 06/13/2020   History of total knee arthroplasty, right 06/13/2020   Acute right-sided back pain 06/09/2020   Depression, major, single episode, mild ([place]) 05/12/2020   Vision changes 12/31/2019   Bilateral knee pain 10/08/2019   Poor dentition 10/08/2019   Healthcare maintenance 10/08/2019   Chronic left shoulder pain 08/31/2019   Hemorrhoids 04/04/2019   History of stroke 01/01/2019   Hypertension associated with diabetes ([place]) 01/01/2019   Pre-diabetes 01/01/2019   Hot flashes 01/01/2019   Onychomycosis 01/01/2019    REFERRING DIAG: M19.011,M19.012 (ICD-10-CM) - Osteoarthritis of bilateral glenohumeral joints  s/p Lt RSA without subscap repair  THERAPY DIAG:  Acute pain of left shoulder  Stiffness of left shoulder, not elsewhere classified  Muscle weakness (generalized)  Rationale for Evaluation and Treatment Rehabilitation  PERTINENT HISTORY: HTN, h/o CVA   PRECAUTIONS: Other: RTSA   SUBJECTIVE:  SUBJECTIVE STATEMENT:  Pt reports continued Lt shoulder pain rated 7/10 after taking a percocet about 30 minutes ago. She reports varied adherence to her initial HEP.   PAIN:  Are you having pain? Yes: NPRS scale: 7/10 Pain location: Lt shoulder Pain description: sore Aggravating factors: movement, constant Relieving factors: meds   OBJECTIVE: (objective measures completed at initial  evaluation unless otherwise dated)  COGNITION: Overall cognitive status: Difficulty to assess due to: recent medication                                  SENSATION: Numbness in hand reported   POSTURE: GHJ IR as expected in sling, notable bruising on medial UE   UPPER EXTREMITY ROM:    Able to achieve 90 deg with empty end feel and reported discomfort    UPPER EXTREMITY MMT:   MMT Right eval Left eval  Shoulder flexion      Shoulder extension      Shoulder abduction      Shoulder adduction      Shoulder internal rotation      Shoulder external rotation      Middle trapezius      Lower trapezius      Elbow flexion      Elbow extension      Wrist flexion      Wrist extension      Wrist ulnar deviation      Wrist radial deviation      Wrist pronation      Wrist supination      Grip strength (lbs)      (Blank rows = not tested)   PALPATION:  Edema around shoulder as expected, smooth motion, spasm in periscapular region             TODAY'S TREATMENT:                                                           [facility] Adult PT Treatment:                                                DATE: 05/02/2022 Therapeutic Exercise: Standing Lt shoulder ER submax isometric into doorway 2x10 with 5-sec hold Standing Lt shoulder IR submax isometric into doorway 2x10 with 5-sec hold Standing Lt shoulder Flexion submax isometric into doorway 2x10 with 5-sec hold Standing Lt shoulder abduction submax isometric into doorway 2x10 with 5-sec hold Seated biceps curls with 1# dumbbells 3x10 Seated shoulder rolls 2x10 forward and backward Manual Therapy: Supine Lt shoulder flexion and abduction PROM with gentle vibrations/ distraction at end-range x8 minutes Neuromuscular re-ed: N/A Therapeutic Activity: N/A Modalities: N/A Self Care: N/A  Treatment                            EVAL 04/26/22:   PT changed  bandages- 1 gauze required use of sterile saline to remove due to scabbing holding gauze to incision site in proximal 1/3 of incision. Scap retraction Pendulums Biceps curls Putty grip & manipulation Returned to sling and fit properly     PATIENT EDUCATION: Education details: Geophysicist/field seismologist of condition, POC, HEP, exercise form/rationale Person educated: Patient Education method: Explanation, Demonstration, Tactile cues, Verbal cues, and Handouts Education comprehension: verbalized understanding, returned demonstration, verbal cues required, tactile cues required, and needs further education   HOME EXERCISE PROGRAM: [id number]   ASSESSMENT:   CLINICAL IMPRESSION: Pt responded excellently to initial exercises today, demonstrating good form and no increase in pain with RTC isometrics and PROM. In lieu of a pt-provided post-op protocol, PT utilized [facility] TSA protocol. Pt will benefit from skilled PT to address her primary impairments and return to her prior level of function with less limitation.   OBJECTIVE IMPAIRMENTS: decreased activity tolerance, decreased knowledge of condition, decreased ROM, decreased strength, increased edema, increased muscle spasms, impaired flexibility, impaired sensation, impaired UE functional use, postural dysfunction, and pain.    ACTIVITY LIMITATIONS: carrying, lifting, sleeping, bed mobility, bathing, dressing, reach over head, and hygiene/grooming   PARTICIPATION LIMITATIONS: meal prep, cleaning, laundry, driving, and community activity   PERSONAL FACTORS: 1-2 comorbidities: HTN, h/o CVA  are also affecting patient's functional outcome.        GOALS: Goals reviewed with patient? Yes   SHORT TERM GOALS: Target date: 05/24/22   1.  Active ER to 30 deg in standing without shoulder elevation Baseline: unable at eval Goal status: INITIAL   2.  AROM Elevation to at least 90 deg with good scapular control Baseline: see  chart Goal status: INITIAL 3.  Passive flexion to 130 Baseline: see chart Goal status: INITIAL 4.  Able to demo full elbow AROM without incr pain Baseline: elbow and wrist motions begun at eval Goal status: INITIAL     LONG TERM GOALS:   Fire all heads of deltoid Baseline: unable at eval Goal status: INITIAL Target date: 05/24/2022  (4wk)   2.  Active shoulder elevation to 130 Baseline: see chart Goal status: INITIAL Target date: 06/07/2022  (6wk)   3.  Average pain <=3/10 in daily activities out of sling, aware of when to take rest breaks for pain managemenet Baseline: in sling at eval Goal status: INITIAL Target date: 06/07/2022  (6wk)   4.  Demo ability to reach light objects, such as empty dishes, into cabinets above shoulder height with involved UE Baseline: unable at eval Goal status: INITIAL Target date: 07/05/2022  (10 wk)   5.  Able to progress weight lifting tolerance to 10lb pain <=3/10 Baseline: will progress as appropriate Goal status: INITIAL Target date: 07/05/2022  (10 wk)   6.  Able to demo at least 75% strength via hand held dynamometry testing in straight plane motions compared to opp UE Baseline: unable to test at eval Goal status: INITIAL Target date: 07/19/2022  (12 wk)     PLAN:   PT FREQUENCY: 1-2x/week   PT DURATION: 12 weeks   PLANNED INTERVENTIONS: Therapeutic exercises, Therapeutic activity, Neuromuscular re-education, Patient/Family education, Self Care, Joint mobilization, Dry Needling, Electrical stimulation, Spinal mobilization, Cryotherapy, Moist heat, scar mobilization, Taping, Traction, Manual therapy, and Re-evaluation   PLAN FOR NEXT SESSION:  continue per protocol (paper copy provided to patient   Vanessa [place], PT, DPT 05/02/22 1:43 PM

## 2022-05-03 ENCOUNTER — Encounter (HOSPITAL_BASED_OUTPATIENT_CLINIC_OR_DEPARTMENT_OTHER): Payer: Self-pay | Admitting: Physical Therapy

## 2022-05-07 ENCOUNTER — Encounter (HOSPITAL_BASED_OUTPATIENT_CLINIC_OR_DEPARTMENT_OTHER): Payer: Medicaid Other | Admitting: Orthopaedic Surgery

## 2022-05-09 ENCOUNTER — Ambulatory Visit: Payer: Medicare Other

## 2022-05-09 DIAGNOSIS — M25612 Stiffness of left shoulder, not elsewhere classified: Secondary | ICD-10-CM

## 2022-05-09 DIAGNOSIS — M6281 Muscle weakness (generalized): Secondary | ICD-10-CM

## 2022-05-09 DIAGNOSIS — M25512 Pain in left shoulder: Secondary | ICD-10-CM | POA: Diagnosis not present

## 2022-05-09 NOTE — Therapy (Addendum)
OUTPATIENT PHYSICAL THERAPY TREATMENT NOTE / DISCHARGE NOTE   Patient Name: Monica Watson MRN: [id number] DOB:1956-02-19, 66 y.o., female Today's Date: 05/09/2022  PCP: Sharion Settler, DO  REFERRING PROVIDER: Vanetta Mulders, MD   END OF SESSION:   PT End of Session - 05/09/22 1522     Visit Number 3    Number of Visits 25    Date for PT Re-Evaluation 07/20/22    Authorization Type MCR    Authorization Time Period FOTO v6, v10, kx mod v15    Progress Note Due on Visit 10    PT Start Time 1525    PT Stop Time 1610    PT Time Calculation (min) 45 min    Activity Tolerance Patient tolerated treatment well;Patient limited by pain    Behavior During Therapy WFL for tasks assessed/performed              Past Medical History:  Diagnosis Date   Arthritis    Cerebrovascular disease    Depression    Headache    Hypertension    Stroke ([place]) 2010   Type 2 diabetes mellitus ([place])    Past Surgical History:  Procedure Laterality Date   bilateral knee replacements      CATARACT EXTRACTION, BILATERAL     FRACTURE SURGERY Right    arm as a child   gallstones removed     I & D KNEE WITH POLY EXCHANGE Right 12/16/2020   Procedure: POLY EXCHANGE RIGHT KNEE;  Surgeon: [name], MD;  Location: [facility];  Service: Orthopedics;  Laterality: Right;   KNEE ARTHROSCOPY Bilateral    left carpal tunnel release      REVERSE SHOULDER ARTHROPLASTY Left 04/23/2022   Procedure: LEFT REVERSE SHOULDER ARTHROPLASTY;  Surgeon: Vanetta Mulders, MD;  Location: [facility];  Service: Orthopedics;  Laterality: Left;   SHOULDER ARTHROSCOPY Bilateral    THYROIDECTOMY     Patient Active Problem List   Diagnosis Date Noted   Rotator cuff arthropathy of left shoulder 04/23/2022   Type 2 diabetes mellitus without complication, without long-term current use of insulin ([place]) 03/20/2022   Neck mass 03/20/2022   Osteoarthritis 02/19/2022   Depressed mood 02/19/2022   Abnormal uterine  bleeding (AUB) 02/08/2022   Internal hemorrhoids 07/28/2021   Viral URI with cough 06/29/2021   Cervical lymphadenopathy 03/11/2021   Status post revision of total replacement of right knee 12/16/2020   Polyethylene wear of right knee prosthesis, 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/15/2020   Sialadenitis 11/23/2020   Abdominal pain 10/23/2020   Cervical cancer screening 08/10/2020   Bartholin cyst 07/29/2020   History of total knee replacement, left 06/13/2020   History of total knee arthroplasty, right 06/13/2020   Acute right-sided back pain 06/09/2020   Depression, major, single episode, mild ([place]) 05/12/2020   Vision changes 12/31/2019   Bilateral knee pain 10/08/2019   Poor dentition 10/08/2019   Healthcare maintenance 10/08/2019   Chronic left shoulder pain 08/31/2019   Hemorrhoids 04/04/2019   History of stroke 01/01/2019   Hypertension associated with diabetes ([place]) 01/01/2019   Pre-diabetes 01/01/2019   Hot flashes 01/01/2019   Onychomycosis 01/01/2019    REFERRING DIAG: M19.011,M19.012 (ICD-10-CM) - Osteoarthritis of bilateral glenohumeral joints  s/p Lt RSA without subscap repair  THERAPY DIAG:  Acute pain of left shoulder  Stiffness of left shoulder, not elsewhere classified  Muscle weakness (generalized)  Rationale for Evaluation and Treatment Rehabilitation  PERTINENT HISTORY: HTN, h/o CVA   PRECAUTIONS: Other: RTSA   SUBJECTIVE:  SUBJECTIVE STATEMENT:  Patient reports that her pain is highest at night and that she has been using her Lt UE to do small things.    PAIN:  Are you having pain? Yes: NPRS scale: 6/10 Pain location: Lt shoulder Pain description: sore Aggravating factors: movement, constant Relieving factors: meds   OBJECTIVE: (objective measures completed at initial  evaluation unless otherwise dated)  COGNITION: Overall cognitive status: Difficulty to assess due to: recent medication                                  SENSATION: Numbness in hand reported   POSTURE: GHJ IR as expected in sling, notable bruising on medial UE   UPPER EXTREMITY ROM:    Able to achieve 90 deg with empty end feel and reported discomfort    UPPER EXTREMITY MMT:   MMT Right eval Left eval  Shoulder flexion      Shoulder extension      Shoulder abduction      Shoulder adduction      Shoulder internal rotation      Shoulder external rotation      Middle trapezius      Lower trapezius      Elbow flexion      Elbow extension      Wrist flexion      Wrist extension      Wrist ulnar deviation      Wrist radial deviation      Wrist pronation      Wrist supination      Grip strength (lbs)      (Blank rows = not tested)   PALPATION:  Edema around shoulder as expected, smooth motion, spasm in periscapular region             TODAY'S TREATMENT:            [facility] Adult PT Treatment:                                                DATE: 05/09/2022 Therapeutic Exercise: Standing Lt shoulder ER submax isometric into doorway 2x10 with 5-sec hold Standing Lt shoulder Flexion submax isometric into doorway 2x10 with 5-sec hold Standing Lt shoulder abduction submax isometric into doorway 2x10 with 5-sec hold Seated biceps curls with 1# dumbbells 3x10 Manual Therapy: Supine Lt shoulder flexion and abduction PROM with gentle vibrations/ distraction at end-range x8 minutes Self Care: Discussion of precautions in adherence to protocol, benefits of icing, not actively using Lt UE                                                  [facility] Treatment:                                                DATE: 05/02/2022 Therapeutic Exercise: Standing Lt shoulder ER submax isometric into doorway 2x10 with 5-sec hold Standing Lt shoulder IR submax isometric into doorway 2x10 with  5-sec hold Standing Lt shoulder Flexion submax isometric into  doorway 2x10 with 5-sec hold Standing Lt shoulder abduction submax isometric into doorway 2x10 with 5-sec hold Seated biceps curls with 1# dumbbells 3x10 Seated shoulder rolls 2x10 forward and backward Manual Therapy: Supine Lt shoulder flexion and abduction PROM with gentle vibrations/ distraction at end-range x8 minutes Neuromuscular re-ed: N/A Therapeutic Activity: N/A Modalities: N/A Self Care: N/A                                                                                  Treatment                            EVAL 04/26/22:   PT changed bandages- 1 gauze required use of sterile saline to remove due to scabbing holding gauze to incision site in proximal 1/3 of incision. Scap retraction Pendulums Biceps curls Putty grip & manipulation Returned to sling and fit properly     PATIENT EDUCATION: Education details: Geophysicist/field seismologist of condition, POC, HEP, exercise form/rationale Person educated: Patient Education method: Explanation, Demonstration, Tactile cues, Verbal cues, and Handouts Education comprehension: verbalized understanding, returned demonstration, verbal cues required, tactile cues required, and needs further education   HOME EXERCISE PROGRAM: [id number]   ASSESSMENT:   CLINICAL IMPRESSION: Patient presents to PT with continued pain in her L shoulder consistent with timeline since surgery. She states that she sees Dr. Sammuel Hines for a follow up appointment tomorrow. Re-iterated precautions to patient as she came into clinic actively using Lt UE and states that she uses it for light activities. Session today continued to focus on submax isometrics and PROM within protocol limitations. Patient continues to benefit from skilled PT services and should be progressed as able to improve functional independence.     OBJECTIVE IMPAIRMENTS: decreased activity tolerance, decreased knowledge of condition, decreased ROM,  decreased strength, increased edema, increased muscle spasms, impaired flexibility, impaired sensation, impaired UE functional use, postural dysfunction, and pain.    ACTIVITY LIMITATIONS: carrying, lifting, sleeping, bed mobility, bathing, dressing, reach over head, and hygiene/grooming   PARTICIPATION LIMITATIONS: meal prep, cleaning, laundry, driving, and community activity   PERSONAL FACTORS: 1-2 comorbidities: HTN, h/o CVA  are also affecting patient's functional outcome.        GOALS: Goals reviewed with patient? Yes   SHORT TERM GOALS: Target date: 05/24/22   1.  Active ER to 30 deg in standing without shoulder elevation Baseline: unable at eval Goal status: INITIAL   2.  AROM Elevation to at least 90 deg with good scapular control Baseline: see chart Goal status: INITIAL 3.  Passive flexion to 130 Baseline: see chart Goal status: INITIAL 4.  Able to demo full elbow AROM without incr pain Baseline: elbow and wrist motions begun at eval Goal status: INITIAL     LONG TERM GOALS:   Fire all heads of deltoid Baseline: unable at eval Goal status: INITIAL Target date: 05/24/2022  (4wk)   2.  Active shoulder elevation to 130 Baseline: see chart Goal status: INITIAL Target date: 06/07/2022  (6wk)   3.  Average pain <=3/10 in daily activities out of sling, aware of when to take rest breaks for pain managemenet Baseline: in sling  at eval Goal status: INITIAL Target date: 06/07/2022  (6wk)   4.  Demo ability to reach light objects, such as empty dishes, into cabinets above shoulder height with involved UE Baseline: unable at eval Goal status: INITIAL Target date: 07/05/2022  (10 wk)   5.  Able to progress weight lifting tolerance to 10lb pain <=3/10 Baseline: will progress as appropriate Goal status: INITIAL Target date: 07/05/2022  (10 wk)   6.  Able to demo at least 75% strength via hand held dynamometry testing in straight plane motions compared to opp  UE Baseline: unable to test at eval Goal status: INITIAL Target date: 07/19/2022  (12 wk)     PLAN:   PT FREQUENCY: 1-2x/week   PT DURATION: 12 weeks   PLANNED INTERVENTIONS: Therapeutic exercises, Therapeutic activity, Neuromuscular re-education, Patient/Family education, Self Care, Joint mobilization, Dry Needling, Electrical stimulation, Spinal mobilization, Cryotherapy, Moist heat, scar mobilization, Taping, Traction, Manual therapy, and Re-evaluation   PLAN FOR NEXT SESSION: continue per protocol (paper copy provided to patient   Margarette Canada, PTA 05/09/22 4:11 PM       PHYSICAL THERAPY DISCHARGE SUMMARY  Visits from Start of Care: 3  Current functional level related to goals / functional outcomes: Continued pain and limited mobility. No goals have been met at this time.   Remaining deficits: Continued limited ROM, strength, continued elevated pain.    Education / Equipment: HEP, theraband, posture   Patient agrees to discharge. Patient goals were not met. Patient is being discharged due to  per MD request she is unable to make to outpatient rehab safely and is more appropriate for home health at this time.  Kristoffer Leamon PT, DPT, LAT, ATC  05/11/22  8:21 AM

## 2022-05-10 ENCOUNTER — Ambulatory Visit (INDEPENDENT_AMBULATORY_CARE_PROVIDER_SITE_OTHER): Payer: Medicare Other

## 2022-05-10 ENCOUNTER — Encounter (HOSPITAL_BASED_OUTPATIENT_CLINIC_OR_DEPARTMENT_OTHER): Payer: Self-pay | Admitting: Physical Therapy

## 2022-05-10 ENCOUNTER — Ambulatory Visit (INDEPENDENT_AMBULATORY_CARE_PROVIDER_SITE_OTHER): Payer: Medicare Other | Admitting: Orthopaedic Surgery

## 2022-05-10 ENCOUNTER — Telehealth: Payer: Self-pay | Admitting: Orthopaedic Surgery

## 2022-05-10 DIAGNOSIS — Z9889 Other specified postprocedural states: Secondary | ICD-10-CM | POA: Diagnosis not present

## 2022-05-10 NOTE — Telephone Encounter (Signed)
Patient called asked if she can get a home health nurse to come out to her home to assist her. Patient said she also would like to have (PT) at home. The number to contact patient is [phone]

## 2022-05-10 NOTE — Progress Notes (Signed)
Post Operative Evaluation    Procedure/Date of Surgery: Left reverse shoulder arthroplasty 04/23/22  Interval History:   Presents today for follow-up of her left shoulder.  Overall she is doing well.  Does not have any pain at rest.  She has been in her sling for the last 2 weeks.  She has begun physical therapy.  Compliant with anticoagulation.   PMH/PSH/Family History/Social History/Meds/Allergies:    Past Medical History:  Diagnosis Date   Arthritis    Cerebrovascular disease    Depression    Headache    Hypertension    Stroke ([place]) 2010   Type 2 diabetes mellitus ([place])    Past Surgical History:  Procedure Laterality Date   bilateral knee replacements      CATARACT EXTRACTION, BILATERAL     FRACTURE SURGERY Right    arm as a child   gallstones removed     I & D KNEE WITH POLY EXCHANGE Right 12/16/2020   Procedure: POLY EXCHANGE RIGHT KNEE;  Surgeon: [name], MD;  Location: [facility];  Service: Orthopedics;  Laterality: Right;   KNEE ARTHROSCOPY Bilateral    left carpal tunnel release      REVERSE SHOULDER ARTHROPLASTY Left 04/23/2022   Procedure: LEFT REVERSE SHOULDER ARTHROPLASTY;  Surgeon: Vanetta Mulders, MD;  Location: [place];  Service: Orthopedics;  Laterality: Left;   SHOULDER ARTHROSCOPY Bilateral    THYROIDECTOMY     Social History   Socioeconomic History   Marital status: Single    Spouse name: Not on file   Number of children: Not on file   Years of education: Not on file   Highest education level: Not on file  Occupational History   Not on file  Tobacco Use   Smoking status: Every Day    Packs/day: 0.50    Years: 46.00    Total pack years: 23.00    Types: Cigarettes    Passive exposure: Current   Smokeless tobacco: Never  Vaping Use   Vaping Use: Never used  Substance and Sexual Activity   Alcohol use: Never   Drug use: Never   Sexual activity: Not on file  Other Topics Concern   Not on file   Social History Narrative   Retired    Right handed   Social Determinants of Health   Financial Resource Strain: Not on file  Food Insecurity: Not on file  Transportation Needs: No Transportation Needs (09/22/2021)   PRAPARE - Hydrologist (Medical): No    Lack of Transportation (Non-Medical): No  Physical Activity: Not on file  Stress: Not on file  Social Connections: Not on file   No family history on file. No Known Allergies Current Outpatient Medications  Medication Sig Dispense Refill   aspirin EC 325 MG tablet Take 1 tablet (325 mg total) by mouth daily. 30 tablet 0   atorvastatin (LIPITOR) 40 MG tablet Take 1 tablet (40 mg total) by mouth daily. 90 tablet 3   benzonatate (TESSALON) 100 MG capsule Take 100 mg by mouth 2 (two) times daily as needed for cough.     ciclopirox (PENLAC) 8 % solution APPLY 1 COAT TO TOENAIL DAILY, REMOVE WEEKLY WITH POLISH REMOVER 6.6 mL 11   clonazePAM (KLONOPIN) 0.25 MG disintegrating tablet Take 0.25 mg by mouth 2 (two)  times daily as needed.     fluticasone (FLONASE) 50 MCG/ACT nasal spray Place 2 sprays into both nostrils daily. (Patient taking differently: Place 2 sprays into both nostrils daily as needed for allergies.) 16 g 6   gabapentin (NEURONTIN) 800 MG tablet Take 1 tablet (800 mg total) by mouth at bedtime. 30 tablet 0   losartan (COZAAR) 50 MG tablet TAKE 1 TABLET(50 MG) BY MOUTH EVERY DAY FOR BLOOD PRESSURE 30 tablet 1   metFORMIN (GLUCOPHAGE-XR) 500 MG 24 hr tablet Take one tablet daily for a week. If you do well with that, increase to one tablet in the morning and one in the evening. (Patient taking differently: Take 500 mg by mouth in the morning.) 180 tablet 3   oxyCODONE-acetaminophen (PERCOCET) 10-325 MG tablet Take 1 tablet by mouth every 6 (six) hours as needed for pain. 30 tablet 0   polyethylene glycol (MIRALAX) 17 g packet Take 17 g by mouth daily. (Patient taking differently: Take 17 g by mouth  daily as needed (constipation.).) 90 each 0   promethazine (PHENERGAN) 12.5 MG tablet Take 12.5 mg by mouth daily as needed for nausea.     psyllium (METAMUCIL) 28 % packet Take 1 packet by mouth 2 (two) times daily. (Patient not taking: Reported on 02/19/2022) 30 each 1   tiZANidine (ZANAFLEX) 4 MG tablet Take 1 tablet (4 mg total) by mouth every 8 (eight) hours as needed for muscle spasms. 30 tablet 0   No current facility-administered medications for this visit.   No results found.  Review of Systems:   A ROS was performed including pertinent positives and negatives as documented in the HPI.   Musculoskeletal Exam:    There were no vitals taken for this visit.  Incision is well-appearing without erythema or drainage.  Active forward elevation in sitting position is to 90 degrees.  External rotation at the side is to 20 degrees.  Internal rotation deferred.  2+ radial pulse.  Distal neurosensory exam is intact  Imaging:    3 views left shoulder: Status post reverse shoulder arthroplasty without evidence of complication  I personally reviewed and interpreted the radiographs.   Assessment:   2 weeks status post left reverse shoulder arthroplasty.  At this time her ambulatory status is quite limited.  She is having difficult time at home.  I do believe that she would benefit from home health and nursing for continued monitoring.  I will plan to see her back in 4 weeks for reassessment  Plan :    -Return to clinic in 4 weeks for reassessment      I personally saw and evaluated the patient, and participated in the management and treatment plan.  Vanetta Mulders, MD Attending Physician, Orthopedic Surgery  This document was dictated using Dragon voice recognition software. A reasonable attempt at proof reading has been made to minimize errors.

## 2022-05-10 NOTE — Telephone Encounter (Signed)
Submitted referral to [facility] health

## 2022-05-11 ENCOUNTER — Ambulatory Visit: Payer: Medicare Other | Admitting: Physical Therapy

## 2022-05-14 ENCOUNTER — Ambulatory Visit
Admission: RE | Admit: 2022-05-14 | Discharge: 2022-05-14 | Disposition: A | Discharge status: Home / Self Care | Payer: Medicaid Other | Source: Ambulatory Visit | Attending: Otolaryngology | Admitting: Otolaryngology

## 2022-05-14 DIAGNOSIS — Z720 Tobacco use: Secondary | ICD-10-CM

## 2022-05-14 DIAGNOSIS — D490 Neoplasm of unspecified behavior of digestive system: Secondary | ICD-10-CM

## 2022-05-14 MED ORDER — IOPAMIDOL (ISOVUE-300) INJECTION 61%
75.0000 mL | Freq: Once | INTRAVENOUS | Status: AC | PRN
  Administered 2022-05-14: 75 mL via INTRAVENOUS

## 2022-05-15 ENCOUNTER — Other Ambulatory Visit: Payer: Self-pay | Admitting: Otolaryngology

## 2022-05-15 DIAGNOSIS — E041 Nontoxic single thyroid nodule: Secondary | ICD-10-CM

## 2022-05-16 ENCOUNTER — Encounter (HOSPITAL_BASED_OUTPATIENT_CLINIC_OR_DEPARTMENT_OTHER): Payer: Self-pay | Admitting: Physical Therapy

## 2022-05-16 ENCOUNTER — Other Ambulatory Visit: Payer: Self-pay | Admitting: Otolaryngology

## 2022-05-16 DIAGNOSIS — G939 Disorder of brain, unspecified: Secondary | ICD-10-CM

## 2022-05-21 ENCOUNTER — Telehealth: Payer: Self-pay

## 2022-05-21 NOTE — Telephone Encounter (Signed)
Patient calls nurse line requesting home health nursing services. She had shoulder replacement on 04/23/22 and feels that she needs nursing services.   Advised that per Medicare guidelines, she would need an appointment to discuss with provider.   Offered appointment with next available doctor for tomorrow. Patient declined, stating that she would rather see PCP.   Scheduled with PCP on 06/07/22.  Talbot Grumbling, RN

## 2022-05-30 ENCOUNTER — Other Ambulatory Visit: Payer: Medicare Other

## 2022-06-01 ENCOUNTER — Telehealth: Payer: Self-pay | Admitting: Orthopaedic Surgery

## 2022-06-01 NOTE — Telephone Encounter (Signed)
Patient has follow up next Thursday. Home health has questions please call --Erlene Quan at [facility] [phone]

## 2022-06-01 NOTE — Telephone Encounter (Signed)
Please follow up on this Monday a.m. Tried calling, no answer.

## 2022-06-07 ENCOUNTER — Ambulatory Visit (INDEPENDENT_AMBULATORY_CARE_PROVIDER_SITE_OTHER): Payer: Medicare Other | Admitting: Orthopaedic Surgery

## 2022-06-07 ENCOUNTER — Ambulatory Visit (INDEPENDENT_AMBULATORY_CARE_PROVIDER_SITE_OTHER): Payer: Medicare Other | Admitting: Family Medicine

## 2022-06-07 VITALS — BP 130/79 | HR 76 | Wt 191.2 lb

## 2022-06-07 DIAGNOSIS — R1084 Generalized abdominal pain: Secondary | ICD-10-CM

## 2022-06-07 DIAGNOSIS — Z9889 Other specified postprocedural states: Secondary | ICD-10-CM

## 2022-06-07 NOTE — Progress Notes (Signed)
Post Operative Evaluation    Procedure/Date of Surgery: Left reverse shoulder arthroplasty 04/23/22  Interval History:   Presents today for follow-up status post left reverse shoulder arthroplasty.  She has been working on progressive range of motion and strengthening with her home visiting therapy.  This has been going well.  She is here today for further assessment.  PMH/PSH/Family History/Social History/Meds/Allergies:    Past Medical History:  Diagnosis Date   Arthritis    Cerebrovascular disease    Depression    Headache    Hypertension    Stroke ([place]) 2010   Type 2 diabetes mellitus ([place])    Past Surgical History:  Procedure Laterality Date   bilateral knee replacements      CATARACT EXTRACTION, BILATERAL     FRACTURE SURGERY Right    arm as a child   gallstones removed     I & D KNEE WITH POLY EXCHANGE Right 12/16/2020   Procedure: POLY EXCHANGE RIGHT KNEE;  Surgeon: [name], MD;  Location: [facility];  Service: Orthopedics;  Laterality: Right;   KNEE ARTHROSCOPY Bilateral    left carpal tunnel release      REVERSE SHOULDER ARTHROPLASTY Left 04/23/2022   Procedure: LEFT REVERSE SHOULDER ARTHROPLASTY;  Surgeon: Vanetta Mulders, MD;  Location: [facility];  Service: Orthopedics;  Laterality: Left;   SHOULDER ARTHROSCOPY Bilateral    THYROIDECTOMY     Social History   Socioeconomic History   Marital status: Single    Spouse name: Not on file   Number of children: Not on file   Years of education: Not on file   Highest education level: Not on file  Occupational History   Not on file  Tobacco Use   Smoking status: Every Day    Packs/day: 0.50    Years: 46.00    Total pack years: 23.00    Types: Cigarettes    Passive exposure: Current   Smokeless tobacco: Never  Vaping Use   Vaping Use: Never used  Substance and Sexual Activity   Alcohol use: Never   Drug use: Never   Sexual activity: Not on file  Other Topics  Concern   Not on file  Social History Narrative   Retired    Right handed   Social Determinants of Health   Financial Resource Strain: Not on file  Food Insecurity: Not on file  Transportation Needs: No Transportation Needs (09/22/2021)   PRAPARE - Hydrologist (Medical): No    Lack of Transportation (Non-Medical): No  Physical Activity: Not on file  Stress: Not on file  Social Connections: Not on file   No family history on file. No Known Allergies Current Outpatient Medications  Medication Sig Dispense Refill   aspirin EC 325 MG tablet Take 1 tablet (325 mg total) by mouth daily. 30 tablet 0   atorvastatin (LIPITOR) 40 MG tablet Take 1 tablet (40 mg total) by mouth daily. 90 tablet 3   benzonatate (TESSALON) 100 MG capsule Take 100 mg by mouth 2 (two) times daily as needed for cough.     ciclopirox (PENLAC) 8 % solution APPLY 1 COAT TO TOENAIL DAILY, REMOVE WEEKLY WITH POLISH REMOVER 6.6 mL 11   clonazePAM (KLONOPIN) 0.25 MG disintegrating tablet Take 0.25 mg by mouth 2 (two) times daily as needed.  fluticasone (FLONASE) 50 MCG/ACT nasal spray Place 2 sprays into both nostrils daily. (Patient taking differently: Place 2 sprays into both nostrils daily as needed for allergies.) 16 g 6   gabapentin (NEURONTIN) 800 MG tablet Take 1 tablet (800 mg total) by mouth at bedtime. 30 tablet 0   losartan (COZAAR) 50 MG tablet TAKE 1 TABLET(50 MG) BY MOUTH EVERY DAY FOR BLOOD PRESSURE 30 tablet 1   metFORMIN (GLUCOPHAGE-XR) 500 MG 24 hr tablet Take one tablet daily for a week. If you do well with that, increase to one tablet in the morning and one in the evening. (Patient taking differently: Take 500 mg by mouth in the morning.) 180 tablet 3   oxyCODONE-acetaminophen (PERCOCET) 10-325 MG tablet Take 1 tablet by mouth every 6 (six) hours as needed for pain. 30 tablet 0   polyethylene glycol (MIRALAX) 17 g packet Take 17 g by mouth daily. (Patient taking differently:  Take 17 g by mouth daily as needed (constipation.).) 90 each 0   promethazine (PHENERGAN) 12.5 MG tablet Take 12.5 mg by mouth daily as needed for nausea.     psyllium (METAMUCIL) 28 % packet Take 1 packet by mouth 2 (two) times daily. (Patient not taking: Reported on 02/19/2022) 30 each 1   tiZANidine (ZANAFLEX) 4 MG tablet Take 1 tablet (4 mg total) by mouth every 8 (eight) hours as needed for muscle spasms. 30 tablet 0   No current facility-administered medications for this visit.   No results found.  Review of Systems:   A ROS was performed including pertinent positives and negatives as documented in the HPI.   Musculoskeletal Exam:    There were no vitals taken for this visit.  Incision is well-healed.  Active forward elevation in standing position is to 100 degrees.  External rotation at the side is to 20 degrees.  Internal rotation is to side 2+ radial pulse.  Distal neurosensory exam is intact  Imaging:    3 views left shoulder: Status post reverse shoulder arthroplasty without evidence of complication  I personally reviewed and interpreted the radiographs.   Assessment:   6-week status post left reverse shoulder arthroplasty.  This time she continues to improve and work on range of motion as well as strengthening.  I will plan to see her back in 6 weeks for reassessment.  All limitations and restrictions were discussed  Plan :    -Return to clinic in 6 weeks for reassessment      I personally saw and evaluated the patient, and participated in the management and treatment plan.  Vanetta Mulders, MD Attending Physician, Orthopedic Surgery  This document was dictated using Dragon voice recognition software. A reasonable attempt at proof reading has been made to minimize errors.

## 2022-06-07 NOTE — Patient Instructions (Signed)
It was wonderful to see you today.  Please bring ALL of your medications with you to every visit.   Today we talked about:  We are doing lab work today to check your blood count, electrolytes, liver function. I will send you a MyChart message if you have MyChart. Otherwise, I will give you a call for abnormal results or send a letter if everything returned back normal. If you don't hear from me in 2 weeks, please call the office.    I am sending a referral to the gastroenterology. I believe you are overdue for a colonoscopy.   Thank you for coming to your visit as scheduled. We have had a large "no-show" problem lately, and this significantly limits our ability to see and care for patients. As a friendly reminder- if you cannot make your appointment please call to cancel. We do have a no show policy for those who do not cancel within 24 hours. Our policy is that if you miss or fail to cancel an appointment within 24 hours, 3 times in a 37-monthperiod, you may be dismissed from our clinic.   [facility] you for choosing [facility]   Please call [phone] any questions about today's appointment.  Please be sure to schedule follow up at the front  desk before you leave today.   ASharion Settler DO PGY-3 Family Medicine

## 2022-06-07 NOTE — Assessment & Plan Note (Addendum)
Somewhat chronic and intermittent issue for her. Thought to due to constipation in the past. She is on narcotic medications so this could be exacerbating her constipation but she specifically denies feeling constipated. Did encourage use of stool softener while taking narcotic pain medications, however. Other differentials include pain related to chronic endometritis, colonic mass, gastritis, PUD. She is overdue for colon cancer screening, I cannot find any records of this.  -Check CBC, CMP -Recommend MiraLAX -Amb ref to GI for colonoscopy

## 2022-06-07 NOTE — Progress Notes (Signed)
    SUBJECTIVE:   CHIEF COMPLAINT / HPI:   Monica Watson is a 66 y.o. female who presents to the [facility] clinic today to discuss the following concerns:   Abdominal Pain She had a pelvic U/S on 8/23 which showed multiple uterine leiomyomata and 74m endometrial complex. She had a colposcopy and endometrial biopsy on 8/31 which showed chronic endometritis. She reports that she is not having any more vaginal bleeding or spotting. She does endorse central and lower abdominal pain, "feels like a stomach ache", since last month. She reports decreased appetite. She denies any weight loss. Denies diarrhea, constipation, or recent blood in her stool. She does endorse some blood with wiping that she feels is attributed to her hemorrhoids. She feels that she had a colonoscopy this year but I cannot find any records of this. She is unable to tell me results or where she had this done. Denies any abdominal surgeries.   She is taking Percocet as needed for her shoulder pain.   PERTINENT  PMH / PSH: T2DM, HTN, s/p left reverse shoulder arthroplasty   OBJECTIVE:   BP 130/79   Pulse 76   Wt 191 lb 3.2 oz (86.7 kg)   SpO2 96%   BMI 31.82 kg/m    General: NAD, pleasant, able to participate in exam Respiratory: normal effort Abdomen: Bowel sounds present, mild tenderness to LUQ, suprapubic area without R/G, nondistended, no hepatosplenomegaly. No masses appreciated Skin: warm and dry, no rashes noted Psych: Normal affect and mood  ASSESSMENT/PLAN:   Abdominal pain Somewhat chronic and intermittent issue for her. Thought to due to constipation in the past. She is on narcotic medications so this could be exacerbating her constipation but she specifically denies feeling constipated. Did encourage use of stool softener while taking narcotic pain medications, however. Other differentials include pain related to chronic endometritis, colonic mass, gastritis, PUD. She is overdue for colon cancer screening, I  cannot find any records of this.  -Check CBC, CMP -Recommend MiraLAX -Amb ref to GI for colonoscopy      ASharion Settler [place]

## 2022-06-08 LAB — COMPREHENSIVE METABOLIC PANEL
ALT: 22 IU/L (ref 0–32)
AST: 16 IU/L (ref 0–40)
Albumin/Globulin Ratio: 1.5 (ref 1.2–2.2)
Albumin: 3.8 g/dL — ABNORMAL LOW (ref 3.9–4.9)
Alkaline Phosphatase: 80 IU/L (ref 44–121)
BUN/Creatinine Ratio: 13 (ref 12–28)
BUN: 10 mg/dL (ref 8–27)
Bilirubin Total: 0.4 mg/dL (ref 0.0–1.2)
CO2: 25 mmol/L (ref 20–29)
Calcium: 9.1 mg/dL (ref 8.7–10.3)
Chloride: 106 mmol/L (ref 96–106)
Creatinine, Ser: 0.75 mg/dL (ref 0.57–1.00)
Globulin, Total: 2.5 g/dL (ref 1.5–4.5)
Glucose: 130 mg/dL — ABNORMAL HIGH (ref 70–99)
Potassium: 4.2 mmol/L (ref 3.5–5.2)
Sodium: 145 mmol/L — ABNORMAL HIGH (ref 134–144)
Total Protein: 6.3 g/dL (ref 6.0–8.5)
eGFR: 88 mL/min/{1.73_m2} (ref >59)

## 2022-06-08 LAB — CBC
Hematocrit: 38.8 % (ref 34.0–46.6)
Hemoglobin: 13 g/dL (ref 11.1–15.9)
MCH: 31.7 pg (ref 26.6–33.0)
MCHC: 33.5 g/dL (ref 31.5–35.7)
MCV: 95 fL (ref 79–97)
Platelets: 210 10*3/uL (ref 150–450)
RBC: 4.1 x10E6/uL (ref 3.77–5.28)
RDW: 11.8 % (ref 11.7–15.4)
WBC: 4.2 10*3/uL (ref 3.4–10.8)

## 2022-06-15 ENCOUNTER — Ambulatory Visit
Admission: RE | Admit: 2022-06-15 | Discharge: 2022-06-15 | Disposition: A | Discharge status: Home / Self Care | Payer: Medicare Other | Source: Ambulatory Visit | Attending: Otolaryngology | Admitting: Otolaryngology

## 2022-06-15 ENCOUNTER — Ambulatory Visit: Admission: RE | Admit: 2022-06-15 | Payer: Medicare Other | Source: Ambulatory Visit

## 2022-06-15 DIAGNOSIS — G939 Disorder of brain, unspecified: Secondary | ICD-10-CM

## 2022-06-15 MED ORDER — GADOPICLENOL 0.5 MMOL/ML IV SOLN
9.0000 mL | Freq: Once | INTRAVENOUS | Status: AC | PRN
  Administered 2022-06-15: 9 mL via INTRAVENOUS

## 2022-07-02 ENCOUNTER — Encounter: Payer: Self-pay | Admitting: Internal Medicine

## 2022-07-06 ENCOUNTER — Ambulatory Visit
Admission: RE | Admit: 2022-07-06 | Discharge: 2022-07-06 | Disposition: A | Discharge status: Home / Self Care | Payer: Medicare Other | Source: Ambulatory Visit | Attending: Family Medicine | Admitting: Family Medicine

## 2022-07-06 DIAGNOSIS — Z13820 Encounter for screening for osteoporosis: Secondary | ICD-10-CM

## 2022-07-08 ENCOUNTER — Other Ambulatory Visit: Payer: Self-pay | Admitting: Family Medicine

## 2022-07-12 ENCOUNTER — Other Ambulatory Visit: Payer: Self-pay | Admitting: Family Medicine

## 2022-07-12 DIAGNOSIS — M8580 Other specified disorders of bone density and structure, unspecified site: Secondary | ICD-10-CM

## 2022-07-12 MED ORDER — OYSTER SHELL CALCIUM/D3 500-5 MG-MCG PO TABS: 1.0000 | ORAL_TABLET | Freq: Every day | ORAL | 3 refills | Status: AC

## 2022-07-25 ENCOUNTER — Encounter (HOSPITAL_BASED_OUTPATIENT_CLINIC_OR_DEPARTMENT_OTHER): Payer: Medicare Other | Admitting: Orthopaedic Surgery

## 2022-08-01 ENCOUNTER — Encounter: Payer: Self-pay | Admitting: Internal Medicine

## 2022-08-01 ENCOUNTER — Ambulatory Visit (INDEPENDENT_AMBULATORY_CARE_PROVIDER_SITE_OTHER): Payer: Medicare Other | Admitting: Internal Medicine

## 2022-08-01 VITALS — BP 124/78 | HR 74 | Ht 65.0 in | Wt 191.0 lb

## 2022-08-01 DIAGNOSIS — R1013 Epigastric pain: Secondary | ICD-10-CM

## 2022-08-01 DIAGNOSIS — R1030 Lower abdominal pain, unspecified: Secondary | ICD-10-CM

## 2022-08-01 DIAGNOSIS — K625 Hemorrhage of anus and rectum: Secondary | ICD-10-CM

## 2022-08-01 DIAGNOSIS — K5900 Constipation, unspecified: Secondary | ICD-10-CM

## 2022-08-01 MED ORDER — NA SULFATE-K SULFATE-MG SULF 17.5-3.13-1.6 GM/177ML PO SOLN: 1.0000 | ORAL | 0 refills | Status: DC

## 2022-08-01 MED ORDER — OMEPRAZOLE 20 MG PO CPDR: 20.0000 mg | DELAYED_RELEASE_CAPSULE | Freq: Every day | ORAL | 3 refills | Status: DC

## 2022-08-01 NOTE — Progress Notes (Signed)
Chief Complaint: Abdominal pain  HPI : 67 year old female with history of DM, CVA, arthritis presents with abdominal pain  She has had lower abdominal pain over the past 2 months. She started having rectal bleeding that looked like a menstrual period about 7-8 years ago. She had to get two pints of blood when she was hospitalized for rectal bleeding. She had a colonoscopy about 7-8 years ago that showed hemorrhoids. Then her rectal bleeding restarted about 4-5 months ago. Sometimes it is hard for her to go to the bathroom. She will pass a BM and then immediately have to go back to pass another BM. She was told that she had fibroids that could be causing her abdominal pain. On average she has 2-3 BMs per day. Sometimes she will take Metamucil, which will help. Denies rectal pain. Denies N&V, dysphagia, chest burning, regurgitation. Denies family history of GI cancers. She takes Aleve once a month. She does take Percocet PRN for arthritis pain   Past Medical History:  Diagnosis Date   Arthritis    Cerebrovascular disease    Depression    Headache    Hypertension    Stroke ([place]) 2010   Type 2 diabetes mellitus ([place])      Past Surgical History:  Procedure Laterality Date   bilateral knee replacements      CATARACT EXTRACTION, BILATERAL     FRACTURE SURGERY Right    arm as a child   gallstones removed     I & D KNEE WITH POLY EXCHANGE Right 12/16/2020   Procedure: POLY EXCHANGE RIGHT KNEE;  Surgeon: [name], MD;  Location: [facility];  Service: Orthopedics;  Laterality: Right;   KNEE ARTHROSCOPY Bilateral    left carpal tunnel release      REVERSE SHOULDER ARTHROPLASTY Left 04/23/2022   Procedure: LEFT REVERSE SHOULDER ARTHROPLASTY;  Surgeon: Vanetta Mulders, MD;  Location: [place];  Service: Orthopedics;  Laterality: Left;   SHOULDER ARTHROSCOPY Bilateral    THYROIDECTOMY     Family History  Problem Relation Age of Onset   Colon cancer Neg Hx    Esophageal cancer Neg  Hx    Stomach cancer Neg Hx    Social History   Tobacco Use   Smoking status: Every Day    Packs/day: 0.50    Years: 46.00    Total pack years: 23.00    Types: Cigarettes    Passive exposure: Current   Smokeless tobacco: Never  Vaping Use   Vaping Use: Never used  Substance Use Topics   Alcohol use: Never   Drug use: Never   Current Outpatient Medications  Medication Sig Dispense Refill   aspirin EC 325 MG tablet Take 1 tablet (325 mg total) by mouth daily. 30 tablet 0   atorvastatin (LIPITOR) 40 MG tablet Take 1 tablet (40 mg total) by mouth daily. 90 tablet 3   benzonatate (TESSALON) 100 MG capsule Take 100 mg by mouth 2 (two) times daily as needed for cough.     calcium-vitamin D (OSCAL WITH D) 500-5 MG-MCG tablet Take 1 tablet by mouth daily with breakfast. 90 tablet 3   ciclopirox (PENLAC) 8 % solution APPLY 1 COAT TO TOENAIL DAILY, REMOVE WEEKLY WITH POLISH REMOVER 6.6 mL 11   clonazePAM (KLONOPIN) 0.25 MG disintegrating tablet Take 0.25 mg by mouth 2 (two) times daily as needed.     fluticasone (FLONASE) 50 MCG/ACT nasal spray Place 2 sprays into both nostrils daily. (Patient taking differently: Place 2  sprays into both nostrils daily as needed for allergies.) 16 g 6   gabapentin (NEURONTIN) 800 MG tablet Take 1 tablet (800 mg total) by mouth at bedtime. 30 tablet 0   losartan (COZAAR) 50 MG tablet TAKE 1 TABLET(50 MG) BY MOUTH EVERY DAY FOR BLOOD PRESSURE 30 tablet 1   metFORMIN (GLUCOPHAGE-XR) 500 MG 24 hr tablet Take one tablet daily for a week. If you do well with that, increase to one tablet in the morning and one in the evening. (Patient taking differently: Take 500 mg by mouth in the morning.) 180 tablet 3   oxyCODONE-acetaminophen (PERCOCET) 10-325 MG tablet Take 1 tablet by mouth every 6 (six) hours as needed for pain. 30 tablet 0   polyethylene glycol (MIRALAX) 17 g packet Take 17 g by mouth daily. 90 each 0   promethazine (PHENERGAN) 12.5 MG tablet Take 12.5 mg by  mouth daily as needed for nausea.     psyllium (METAMUCIL) 28 % packet Take 1 packet by mouth 2 (two) times daily. 30 each 1   tiZANidine (ZANAFLEX) 4 MG tablet Take 1 tablet (4 mg total) by mouth every 8 (eight) hours as needed for muscle spasms. 30 tablet 0   No current facility-administered medications for this visit.   No Known Allergies   Review of Systems: All systems reviewed and negative except where noted in HPI.   Physical Exam: BP 124/78   Pulse 74   Ht '5\' 5"'$  (1.651 m)   Wt 191 lb (86.6 kg)   BMI 31.78 kg/m  Constitutional: Pleasant,well-developed, female in no acute distress. HEENT: Normocephalic and atraumatic. Conjunctivae are normal. No scleral icterus. Cardiovascular: Normal rate, regular rhythm.  Pulmonary/chest: Effort normal and breath sounds normal. No wheezing, rales or rhonchi. Abdominal: Soft, nondistended, tender in the epigastric area. Bowel sounds active throughout. There are no masses palpable. No hepatomegaly. Extremities: No edema Neurological: Alert and oriented to person place and time. Skin: Skin is warm and dry. No rashes noted. Psychiatric: Normal mood and affect. Behavior is normal.  Labs 03/2022: BMP unremarkable  Labs 05/2022: CBC and CMP unremarkable.   Pelvic U/S 02/14/22: IMPRESSION: Heterogeneous myometrium with multiple uterine leiomyomata. Endometrial complex unremarkable, 2 mm thick. Normal appearing ovaries.   ASSESSMENT AND PLAN: Rectal bleeding Constipation Lower abdominal pain Epigastric abdominal pain Patient presents with lower ab pain over the last 2 months, which may be related to her constipation or known fibroids seen on her recent pelvic U/S. She has also noted rectal bleeding that restarted 4-5 months ago. Her last colonoscopy was 6-7 years ago that showed hemorrhoids at that time. On physical exam, she was noted to have some epigastric ab pain so will start her on omeprazole to see if this helps with her pain. Will  plan for a colonoscopy to look for a source of her rectal bleeding and to rule out malignancy. - Drink 8 cups of water per day, walk 30 min per day, daily fiber supplement - Start daily dose of Miralax - Start omeprazole 20 mg QD for epigastric ab pain - Colonoscopy [facility]  Christia Reading, MD  I spent 47 minutes of time, including in depth chart review, independent review of results as outlined above, communicating results with the patient directly, face-to-face time with the patient, coordinating care, ordering studies and medications as appropriate, and documentation.

## 2022-08-01 NOTE — Patient Instructions (Addendum)
You have been scheduled for a colonoscopy. Please follow written instructions given to you at your visit today.  Please pick up your prep supplies at the pharmacy within the next 1-3 days. If you use inhalers (even only as needed), please bring them with you on the day of your procedure.  We have sent the following medications to your pharmacy for you to pick up at your convenience: Suprep , Omeprazole   Drink 8 cups of water a day and walk 30 minutes a day.  Please purchase the following medications over the counter and take as directed: Fiber supplement such as Benefiber- use as directed daily Miralax: Take as  directed up to 3 times a day to achieve regular bowel movements  _______________________________________________________  If your blood pressure at your visit was 140/90 or greater, please contact your primary care physician to follow up on this.  _______________________________________________________  If you are age 50 or older, your body mass index should be between 23-30. Your Body mass index is 31.78 kg/m. If this is out of the aforementioned range listed, please consider follow up with your Primary Care Provider.  If you are age 84 or younger, your body mass index should be between 19-25. Your Body mass index is 31.78 kg/m. If this is out of the aformentioned range listed, please consider follow up with your Primary Care Provider.   ________________________________________________________  The [facility] providers would like to encourage you to use [facility] to communicate with providers for non-urgent requests or questions.  Due to long hold times on the telephone, sending your provider a message by [facility] may be a faster and more efficient way to get a response.  Please allow 48 business hours for a response.  Please remember that this is for non-urgent requests.  _______________________________________________________   _  x_   ORAL DIABETIC MEDICATION  INSTRUCTIONS  The day before your procedure:  Take your diabetic pill as you do normally  The day of your procedure:  Do not take your diabetic pill   We will check your blood sugar levels during the admission process and again in Recovery before discharging you home     Thank you for choosing me and [facility].  Dr.Ying Lorenso Courier

## 2022-08-06 ENCOUNTER — Other Ambulatory Visit: Payer: Self-pay | Admitting: Family Medicine

## 2022-08-08 ENCOUNTER — Ambulatory Visit (INDEPENDENT_AMBULATORY_CARE_PROVIDER_SITE_OTHER): Payer: Medicare Other | Admitting: Orthopaedic Surgery

## 2022-08-08 ENCOUNTER — Ambulatory Visit (INDEPENDENT_AMBULATORY_CARE_PROVIDER_SITE_OTHER): Payer: Medicare Other

## 2022-08-08 DIAGNOSIS — Z9889 Other specified postprocedural states: Secondary | ICD-10-CM

## 2022-08-08 NOTE — Progress Notes (Signed)
Post Operative Evaluation    Procedure/Date of Surgery: Left reverse shoulder arthroplasty 04/23/22  Interval History:   Presents today for 21-monthfollow-up status post left reverse shoulder arthroplasty.  She is continuing to make slow and steady improvement.  She has having difficult time laying directly on the side.  PMH/PSH/Family History/Social History/Meds/Allergies:    Past Medical History:  Diagnosis Date   Arthritis    Cerebrovascular disease    Depression    Headache    Hypertension    Stroke ([facility] 2010   Type 2 diabetes mellitus ([facility]    Past Surgical History:  Procedure Laterality Date   bilateral knee replacements      CATARACT EXTRACTION, BILATERAL     FRACTURE SURGERY Right    arm as a child   gallstones removed     I & D KNEE WITH POLY EXCHANGE Right 12/16/2020   Procedure: POLY EXCHANGE RIGHT KNEE;  Surgeon: BMcarthur Rossetti MD;  Location: [facility];  Service: Orthopedics;  Laterality: Right;   KNEE ARTHROSCOPY Bilateral    left carpal tunnel release      REVERSE SHOULDER ARTHROPLASTY Left 04/23/2022   Procedure: LEFT REVERSE SHOULDER ARTHROPLASTY;  Surgeon: BVanetta Mulders MD;  [facility]  Service: Orthopedics;  Laterality: Left;   SHOULDER ARTHROSCOPY Bilateral    THYROIDECTOMY     Social History   Socioeconomic History   Marital status: Single    Spouse name: Not on file   Number of children: Not on file   Years of education: Not on file   Highest education level: Not on file  Occupational History   Not on file  Tobacco Use   Smoking status: Every Day    Packs/day: 0.50    Years: 46.00    Total pack years: 23.00    Types: Cigarettes    Passive exposure: Current   Smokeless tobacco: Never  Vaping Use   Vaping Use: Never used  Substance and Sexual Activity   Alcohol use: Never   Drug use: Never   Sexual activity: Not on file  Other Topics Concern   Not on file  Social History Narrative    Retired    Right handed   Social Determinants of Health   Financial Resource Strain: Not on file  Food Insecurity: Not on file  Transportation Needs: No Transportation Needs (09/22/2021)   PRAPARE - THydrologist(Medical): No    Lack of Transportation (Non-Medical): No  Physical Activity: Not on file  Stress: Not on file  Social Connections: Not on file   No family history on file. No Known Allergies Current Outpatient Medications  Medication Sig Dispense Refill   aspirin EC 325 MG tablet Take 1 tablet (325 mg total) by mouth daily. 30 tablet 0   atorvastatin (LIPITOR) 40 MG tablet Take 1 tablet (40 mg total) by mouth daily. 90 tablet 3   benzonatate (TESSALON) 100 MG capsule Take 100 mg by mouth 2 (two) times daily as needed for cough.     ciclopirox (PENLAC) 8 % solution APPLY 1 COAT TO TOENAIL DAILY, REMOVE WEEKLY WITH POLISH REMOVER 6.6 mL 11   clonazePAM (KLONOPIN) 0.25 MG disintegrating tablet Take 0.25 mg by mouth 2 (two) times daily as needed.     fluticasone (FLONASE) 50 MCG/ACT nasal  spray Place 2 sprays into both nostrils daily. (Patient taking differently: Place 2 sprays into both nostrils daily as needed for allergies.) 16 g 6   gabapentin (NEURONTIN) 800 MG tablet Take 1 tablet (800 mg total) by mouth at bedtime. 30 tablet 0   losartan (COZAAR) 50 MG tablet TAKE 1 TABLET(50 MG) BY MOUTH EVERY DAY FOR BLOOD PRESSURE 30 tablet 1   metFORMIN (GLUCOPHAGE-XR) 500 MG 24 hr tablet Take one tablet daily for a week. If you do well with that, increase to one tablet in the morning and one in the evening. (Patient taking differently: Take 500 mg by mouth in the morning.) 180 tablet 3   oxyCODONE-acetaminophen (PERCOCET) 10-325 MG tablet Take 1 tablet by mouth every 6 (six) hours as needed for pain. 30 tablet 0   polyethylene glycol (MIRALAX) 17 g packet Take 17 g by mouth daily. (Patient taking differently: Take 17 g by mouth daily as needed  (constipation.).) 90 each 0   promethazine (PHENERGAN) 12.5 MG tablet Take 12.5 mg by mouth daily as needed for nausea.     psyllium (METAMUCIL) 28 % packet Take 1 packet by mouth 2 (two) times daily. (Patient not taking: Reported on 02/19/2022) 30 each 1   tiZANidine (ZANAFLEX) 4 MG tablet Take 1 tablet (4 mg total) by mouth every 8 (eight) hours as needed for muscle spasms. 30 tablet 0   No current facility-administered medications for this visit.   No results found.  Review of Systems:   A ROS was performed including pertinent positives and negatives as documented in the HPI.   Musculoskeletal Exam:    There were no vitals taken for this visit.  Incision is well-healed.  Active forward elevation is to 130 degrees with external rotation at side to 30 degrees.  Internal rotation is to back pocket.  Improved strength with forward elevation Imaging:    3 views left shoulder: Status post reverse shoulder arthroplasty without evidence of complication  I personally reviewed and interpreted the radiographs.   Assessment:   12-weeks status post left reverse shoulder arthroplasty.  At this time she has not been doing physical therapy.  I do believe she would benefit from some strengthening and range of motion of his left shoulder.  Will plan to order this today for the Ortho care [employer] office.  She will continue to advance as tolerated.  Plan :    -Return to clinic in 3 months for recheck     I personally saw and evaluated the patient, and participated in the management and treatment plan.  Vanetta Mulders, MD Attending Physician, Orthopedic Surgery  This document was dictated using Dragon voice recognition software. A reasonable attempt at proof reading has been made to minimize errors.

## 2022-08-13 ENCOUNTER — Ambulatory Visit: Payer: Medicare Other | Admitting: Podiatry

## 2022-08-27 ENCOUNTER — Encounter: Payer: Self-pay | Admitting: Family Medicine

## 2022-08-27 ENCOUNTER — Ambulatory Visit (INDEPENDENT_AMBULATORY_CARE_PROVIDER_SITE_OTHER): Payer: 59 | Admitting: Family Medicine

## 2022-08-27 VITALS — BP 118/72 | HR 88 | Ht 65.0 in | Wt 190.6 lb

## 2022-08-27 DIAGNOSIS — H579 Unspecified disorder of eye and adnexa: Secondary | ICD-10-CM | POA: Diagnosis not present

## 2022-08-27 DIAGNOSIS — I152 Hypertension secondary to endocrine disorders: Secondary | ICD-10-CM

## 2022-08-27 DIAGNOSIS — Z13220 Encounter for screening for lipoid disorders: Secondary | ICD-10-CM | POA: Diagnosis not present

## 2022-08-27 DIAGNOSIS — Z8673 Personal history of transient ischemic attack (TIA), and cerebral infarction without residual deficits: Secondary | ICD-10-CM | POA: Diagnosis not present

## 2022-08-27 DIAGNOSIS — D119 Benign neoplasm of major salivary gland, unspecified: Secondary | ICD-10-CM | POA: Diagnosis not present

## 2022-08-27 DIAGNOSIS — E781 Pure hyperglyceridemia: Secondary | ICD-10-CM | POA: Diagnosis not present

## 2022-08-27 DIAGNOSIS — E1159 Type 2 diabetes mellitus with other circulatory complications: Secondary | ICD-10-CM | POA: Diagnosis not present

## 2022-08-27 IMAGING — MR MR LUMBAR SPINE W/O CM
4 of 5 series · 27 of 48 positions shown · non-contrast
Comparison: MRI lumbar spine 08/31/2019

CLINICAL DATA: Lumbar radiculopathy.  Right leg pain.

EXAM:
MRI LUMBAR SPINE WITHOUT CONTRAST
TECHNIQUE: Multiplanar, multisequence MR imaging of the lumbar spine was
performed. No intravenous contrast was administered.

[Series 3: T2 · sagittal · 4.0mm · 1.09mm/px · 6 of 17 slices shown (1 of 2)]
[im 1/17]
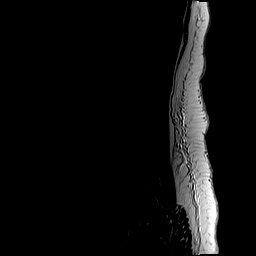
[im 4/17]
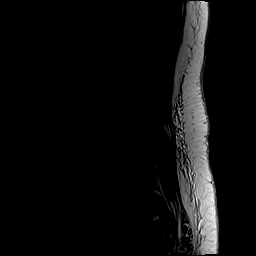
[im 7/17]
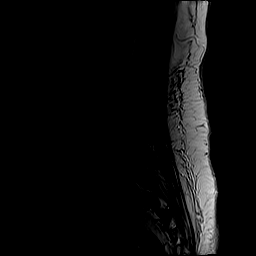
[im 10/17]
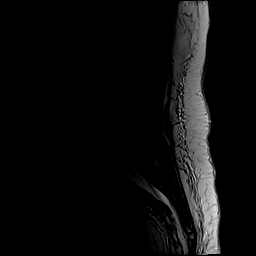
[im 13/17]
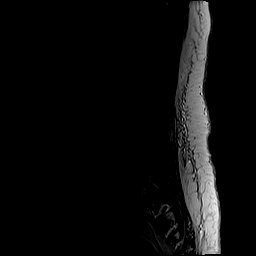
[im 17/17]
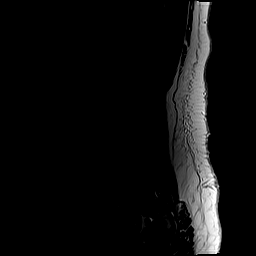

[Series 5: T1 · sagittal · 4.0mm · 1.09mm/px · 6 of 17 slices shown (1 of 2)]
[im 1/17]
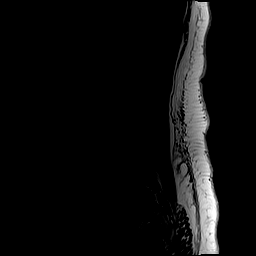
[im 4/17]
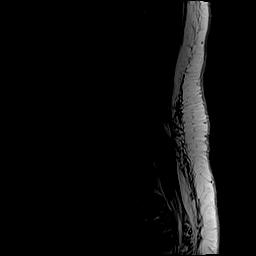
[im 7/17]
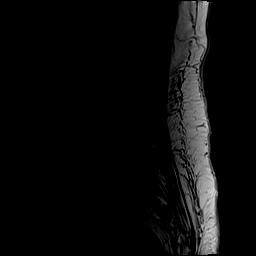
[im 10/17]
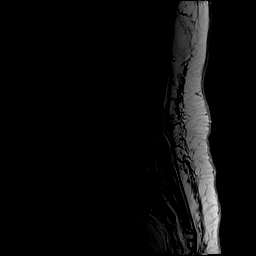
[im 13/17]
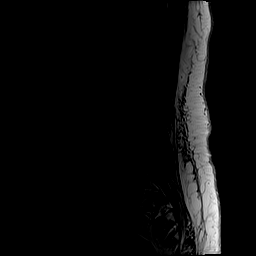
[im 17/17]
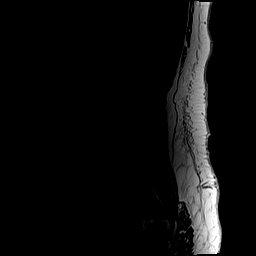

[Series 6: T2 · axial · 4.0mm · 0.39mm/px · z∈[-117,+91]mm · 9 of 43 slices shown (2 of 2)]
[im 1/43]
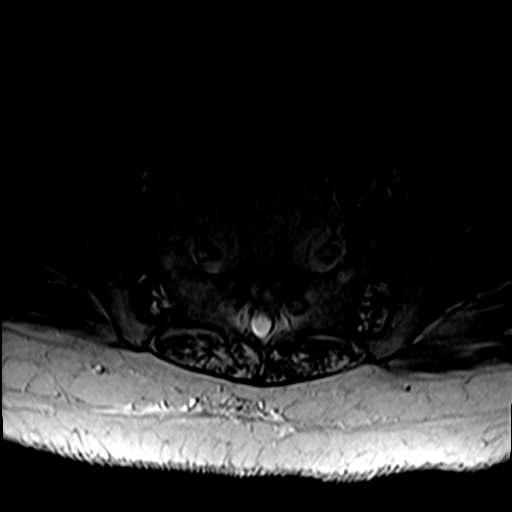
[im 7/43]
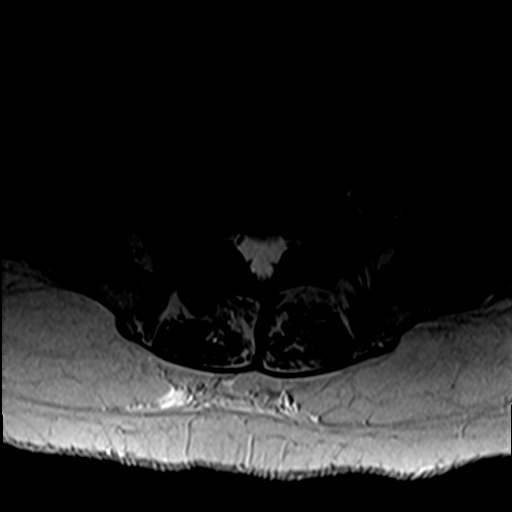
[im 13/43]
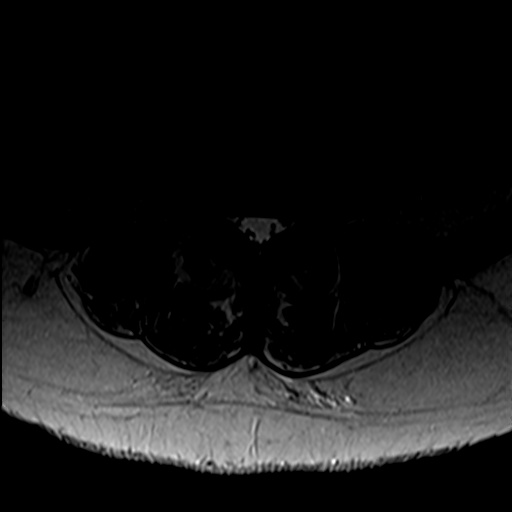
[im 19/43]
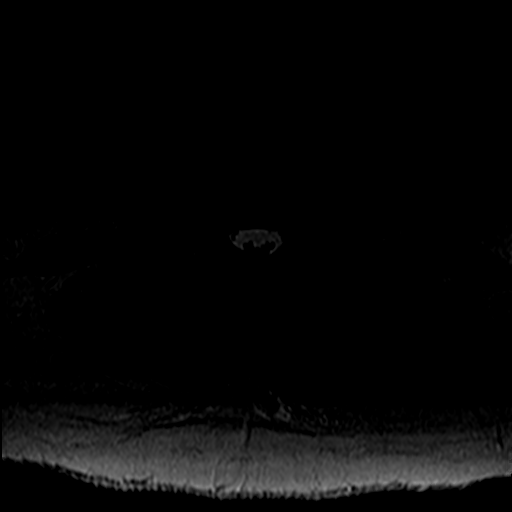
[im 22/43]
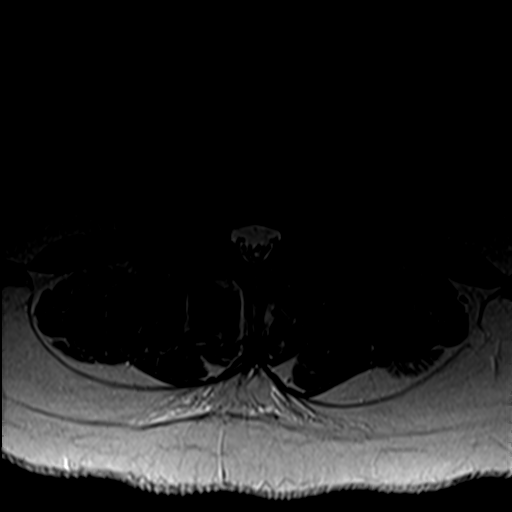
[im 25/43]
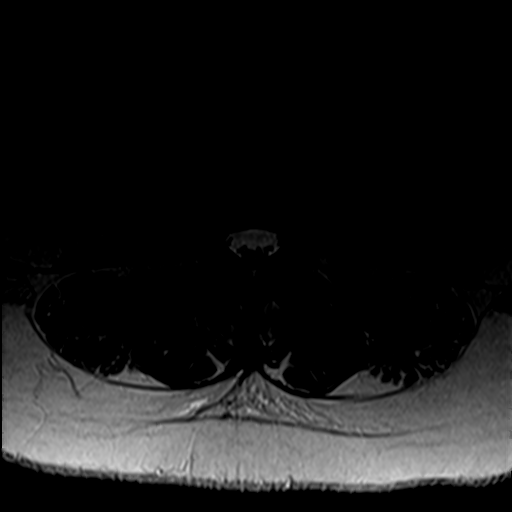
[im 31/43]
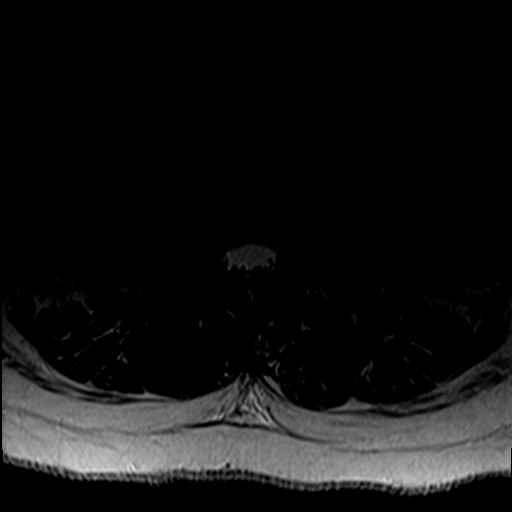
[im 37/43]
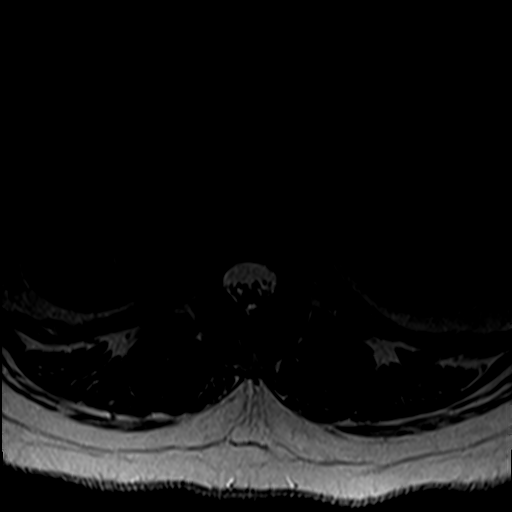
[im 43/43]
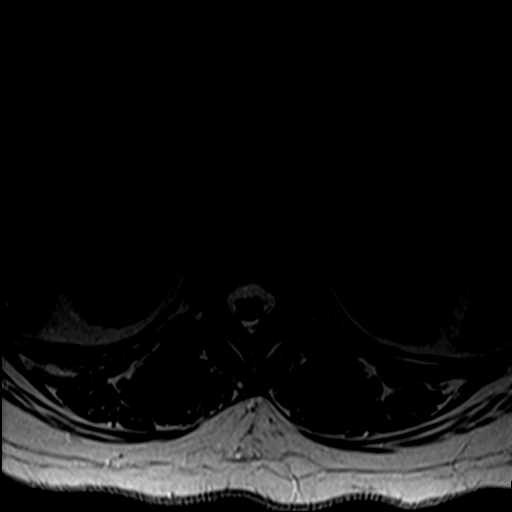

[Series 7: T1 · axial · 4.0mm · 0.39mm/px · z∈[-117,+62]mm · 6 of 43 slices shown (2 of 2)]
[im 1/43]
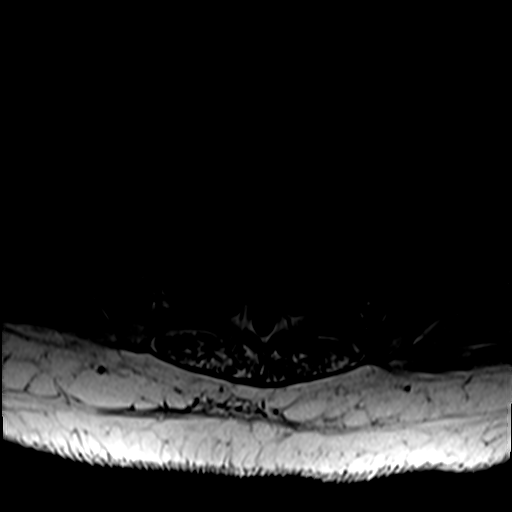
[im 7/43]
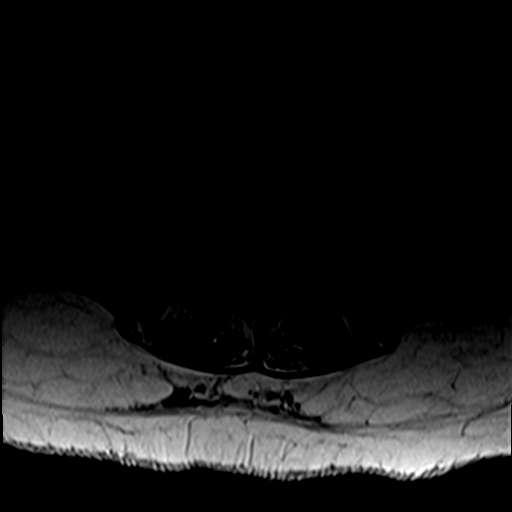
[im 13/43]
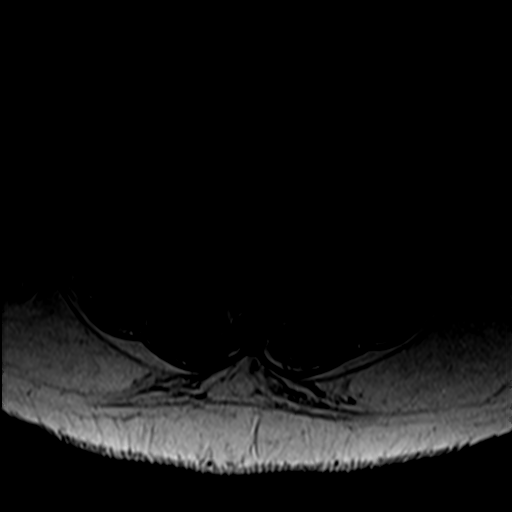
[im 19/43]
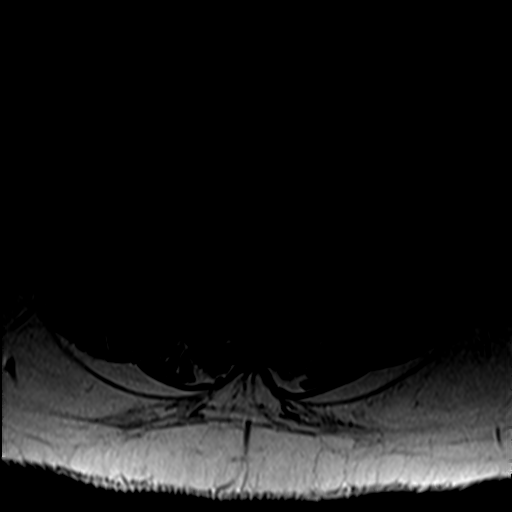
[im 22/43]
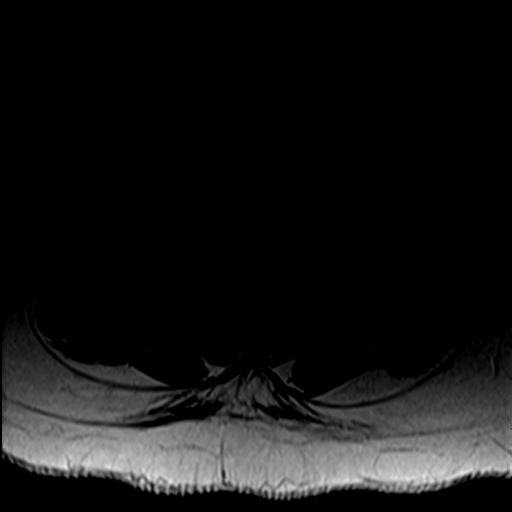
[im 37/43]
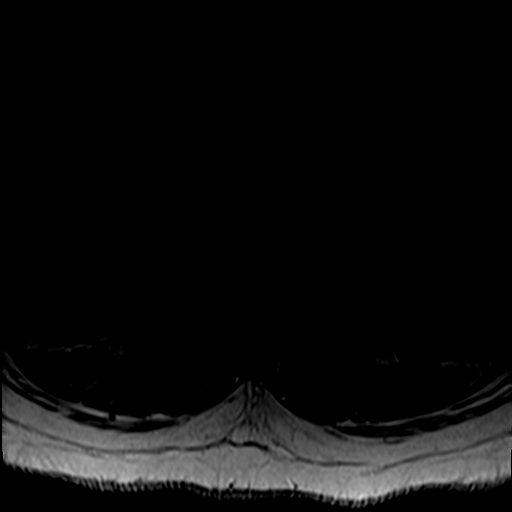

[27 of 48 positions shown; findings below may reference images not displayed]

FINDINGS: Segmentation:  Normal

Alignment:  Normal

Vertebrae:  Normal bone marrow.  Negative for fracture or mass.

Conus medullaris and cauda equina: Conus extends to the L1-2 level.
Conus and cauda equina appear normal.

Paraspinal and other soft tissues: Negative for paraspinous mass or
adenopathy.

Disc levels:

L1-2: Mild disc degeneration and disc bulging. Negative for stenosis

L2-3: Mild disc bulging and mild facet degeneration. No significant
stenosis.

L3-4: Mild disc bulging and endplate spurring. Moderate facet
degeneration. No significant stenosis.

L4-5: Disc degeneration with diffuse disc bulging and endplate
spurring. Moderate facet hypertrophy and degenerative change
bilaterally. Moderate subarticular and foraminal stenosis
bilaterally with mild progression.

L5-S1: Mild disc degeneration with disc bulging and spurring.
Bilateral facet hypertrophy. No significant stenosis.
IMPRESSION: Multilevel disc and facet degeneration lumbar spine most prominent
L4-5. Mild progression of subarticular and foraminal stenosis
bilaterally L4-5.

## 2022-08-27 MED ORDER — OLOPATADINE HCL 0.2 % OP SOLN: 1.0000 [drp] | Freq: Every day | OPHTHALMIC | 0 refills | Status: DC

## 2022-08-27 NOTE — Progress Notes (Signed)
    SUBJECTIVE:   CHIEF COMPLAINT / HPI:   Monica Watson is a 67 y.o. female who presents to the [facility] clinic today to discuss the following concerns:   Warthins Tumor F/U Patient has known left Warthins tumor for which she has seen Dr. Sabino Gasser with ENT. He offered parotidectomy. Patient states that she would like to move forward with this because it is now bothering her. It is not enlarging and not necessarily painful but it is uncomfortable when she turns her head.   PERTINENT  PMH / PSH: HTN, hx CVA, T2DM   OBJECTIVE:   BP 118/72   Pulse 88   Ht '5\' 5"'$  (1.651 m)   Wt 190 lb 9.6 oz (86.5 kg)   SpO2 96%   BMI 31.72 kg/m    General: NAD, pleasant, able to participate in exam HEENT: Approximately 5x5 cm soft and mildly tender submandibular mass  Respiratory: normal effort Psych: Normal affect and mood    ASSESSMENT/PLAN:   1. Hypertriglyceridemia Rechecking lipid panel today. On lipitor 40 mg daily. Has hx of CVA so LDL goal <70.  - Lipid Panel -Continue statin  2. Warthin's tumor Known tumor. Previously looks like she declined surgical intervention. She is now amenable to this. Provided information to contact ENT office to move forward with procedure.   3. Hypertension associated with diabetes ([place]) Normotensive today. BMP up to date. Continue Losartan 50 mg.   4. Itchy eyes Reports intermittent itchy and water eyes. Thinks related to allergies. -Rx Olopatadine drops    Sharion Settler, DO [place]

## 2022-08-27 NOTE — Patient Instructions (Addendum)
It was wonderful to see you today.  Please bring ALL of your medications with you to every visit.   Today we talked about:  I have sent in eye drops.  We are doing a blood test to check your cholesterol.  Please call the office below.   Jenetta Downer, MD  Facial Plastic & Reconstructive Surgery Otolaryngology - Head and Neck Surgery [facility] [place]  Address: [address] [place], [place], [place] [address] Phone: [phone]  Thank you for coming to your visit as scheduled. We have had a large "no-show" problem lately, and this significantly limits our ability to see and care for patients. As a friendly reminder- if you cannot make your appointment please call to cancel. We do have a no show policy for those who do not cancel within 24 hours. Our policy is that if you miss or fail to cancel an appointment within 24 hours, 3 times in a 7-monthperiod, you may be dismissed from our clinic.   Thank you for choosing [facility]   Please call [phone] any questions about today's appointment.  Please be sure to schedule follow up at the front  desk before you leave today.   ASharion Settler DO PGY-3 Family Medicine

## 2022-08-28 ENCOUNTER — Other Ambulatory Visit: Payer: Self-pay | Admitting: Family Medicine

## 2022-08-28 LAB — LIPID PANEL
Chol/HDL Ratio: 3.1 ratio (ref 0.0–4.4)
Cholesterol, Total: 171 mg/dL (ref 100–199)
HDL: 56 mg/dL (ref >39)
LDL Chol Calc (NIH): 85 mg/dL (ref 0–99)
Triglycerides: 180 mg/dL — ABNORMAL HIGH (ref 0–149)
VLDL Cholesterol Cal: 30 mg/dL (ref 5–40)

## 2022-08-28 MED ORDER — EZETIMIBE 10 MG PO TABS: 10.0000 mg | ORAL_TABLET | Freq: Every day | ORAL | 3 refills | Status: DC

## 2022-09-03 ENCOUNTER — Ambulatory Visit (INDEPENDENT_AMBULATORY_CARE_PROVIDER_SITE_OTHER): Payer: Medicare Other | Admitting: Podiatry

## 2022-09-03 DIAGNOSIS — Z91199 Patient's noncompliance with other medical treatment and regimen due to unspecified reason: Secondary | ICD-10-CM

## 2022-09-03 NOTE — Progress Notes (Signed)
1. No-show for appointment     

## 2022-09-07 ENCOUNTER — Ambulatory Visit: Payer: 59 | Attending: Orthopaedic Surgery

## 2022-09-07 ENCOUNTER — Encounter: Payer: Self-pay | Admitting: Internal Medicine

## 2022-09-10 ENCOUNTER — Other Ambulatory Visit (HOSPITAL_COMMUNITY): Payer: Self-pay | Admitting: Physician Assistant

## 2022-09-10 ENCOUNTER — Telehealth: Payer: Self-pay | Admitting: Family Medicine

## 2022-09-10 DIAGNOSIS — M5416 Radiculopathy, lumbar region: Secondary | ICD-10-CM

## 2022-09-10 NOTE — Telephone Encounter (Signed)
[place] to schedule their annual wellness visit. Welcome to Medicare visit Due by 03/26/2023.  Thank you,  [place] Direct dial  [phone]

## 2022-09-17 ENCOUNTER — Telehealth: Payer: Self-pay | Admitting: Internal Medicine

## 2022-09-17 ENCOUNTER — Encounter: Payer: Medicare Other | Admitting: Internal Medicine

## 2022-09-17 NOTE — Telephone Encounter (Signed)
Hi Dr Lorenso Courier,   I called patient at 1:42 pm regarding her appointment patient did not respond and I could not leave voice message for patient. Patient did not show up to her appointment.

## 2022-09-18 DIAGNOSIS — M25511 Pain in right shoulder: Secondary | ICD-10-CM | POA: Diagnosis not present

## 2022-09-18 DIAGNOSIS — G8929 Other chronic pain: Secondary | ICD-10-CM | POA: Diagnosis not present

## 2022-09-18 DIAGNOSIS — M25512 Pain in left shoulder: Secondary | ICD-10-CM | POA: Diagnosis not present

## 2022-09-18 DIAGNOSIS — M542 Cervicalgia: Secondary | ICD-10-CM | POA: Diagnosis not present

## 2022-09-18 DIAGNOSIS — M5416 Radiculopathy, lumbar region: Secondary | ICD-10-CM | POA: Diagnosis not present

## 2022-09-18 DIAGNOSIS — M549 Dorsalgia, unspecified: Secondary | ICD-10-CM | POA: Diagnosis not present

## 2022-09-18 DIAGNOSIS — Z79899 Other long term (current) drug therapy: Secondary | ICD-10-CM | POA: Diagnosis not present

## 2022-09-19 ENCOUNTER — Ambulatory Visit (HOSPITAL_COMMUNITY): Admission: RE | Admit: 2022-09-19 | Payer: 59 | Source: Ambulatory Visit

## 2022-09-19 ENCOUNTER — Encounter (HOSPITAL_COMMUNITY): Payer: Self-pay

## 2022-09-26 ENCOUNTER — Ambulatory Visit: Payer: 59 | Admitting: Physical Therapy

## 2022-10-07 NOTE — Progress Notes (Signed)
    SUBJECTIVE:   CHIEF COMPLAINT / HPI:   Monica Watson is a 67 y.o. 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  At that time she expressed desire to move forward with surgery to remove tumor.  She has been followed by Dr. Ernestene Kiel with ENT for this.  At last office visit she was given contact information to call to arrange appointment for excision.  She feels that she has had worsening hearing in her left ear. Thinks it may be because of the tumor.  Misplaced contact information for ENT.  Would like to proceed with surgical removal.  PERTINENT  PMH / PSH: Hypertension, type 2 diabetes, history of CVA, OA  OBJECTIVE:   BP 127/82   Pulse 77   Ht 5\' 5"  (1.651 m)   Wt 198 lb 9.6 oz (90.1 kg)   SpO2 100%   BMI 33.05 kg/m    General: NAD, pleasant, able to participate in exam HEENT: Left lateral neck mass, approximately 4x4 cm (similar to previous exams), soft and non-tender, bilateral TM clear with good light reflex Respiratory: normal effort Abdomen: Bowel sounds present, nontender, nondistended, no hepatosplenomegaly. Psych: Normal affect and mood  Hearing Screening   500Hz  1000Hz  2000Hz  4000Hz   Right ear Pass Pass Fail Fail  Left ear Pass Pass Pass Pass    ASSESSMENT/PLAN:   1. Type 2 diabetes mellitus without complication, without long-term current use of insulin A1c 6.2, at goal. She was concerned as she heard Metformin could cause cancer from one of her friends. Reassured her. Discussed that if she does feel concerned about this, it would be reasonable to back off or stop given her diabetes has been controlled for years.  - HgB A1c, repeat in 6 months  - Continue atorvastatin, losartan   2. Abnormal hearing test Surprisingly passed on left side, which is where her warthin's tumor is, but failed 2000Hz  and above on right ear. TM clear bilaterally. Will send referral for  further eval.  - Ambulatory referral to Audiology  3. Warthin's tumor Unchanged from last visit. Causing her trouble, would like to proceed with surgical removal. -Information to contact ENT provided again on AVS  4. Seasonal allergies Requesting refill.  - fluticasone (FLONASE) 50 MCG/ACT nasal spray; Place 2 sprays into both nostrils daily.  Dispense: 16 g; Refill: 6 - Olopatadine HCl 0.2 % SOLN; Apply 1 drop to eye daily.  Dispense: 2.5 mL; Refill: 0   Sabino Dick, DO [facility]

## 2022-10-07 NOTE — Patient Instructions (Incomplete)
It was wonderful to see you today.  Please bring ALL of your medications with you to every visit.   Today we talked about:  You had an abnormal hearing test. I am sending a referral to Audiology for further evaluation.  Your A1c is 6.2 which is fantastic!   Please call the office below.    Scarlette Ar, MD  Facial Plastic & Reconstructive Surgery Otolaryngology - Head and Neck Surgery [facility]  Address: [address] [place], [place], [place] [address] Phone: [phone]  Thank you for coming to your visit as scheduled. We have had a large "no-show" problem lately, and this significantly limits our ability to see and care for patients. As a friendly reminder- if you cannot make your appointment please call to cancel. We do have a no show policy for those who do not cancel within 24 hours. Our policy is that if you miss or fail to cancel an appointment within 24 hours, 3 times in a 54-month period, you may be dismissed from our clinic.   Thank you for choosing [facility].   Please call [phone] with any questions about today's appointment.  Please be sure to schedule follow up at the front  desk before you leave today.   Sabino Dick, DO PGY-3 Family Medicine

## 2022-10-08 ENCOUNTER — Ambulatory Visit (INDEPENDENT_AMBULATORY_CARE_PROVIDER_SITE_OTHER): Payer: 59 | Admitting: Family Medicine

## 2022-10-08 ENCOUNTER — Encounter: Payer: Self-pay | Admitting: Family Medicine

## 2022-10-08 VITALS — BP 127/82 | HR 77 | Ht 65.0 in | Wt 198.6 lb

## 2022-10-08 DIAGNOSIS — R94128 Abnormal results of other function studies of ear and other special senses: Secondary | ICD-10-CM

## 2022-10-08 DIAGNOSIS — E119 Type 2 diabetes mellitus without complications: Secondary | ICD-10-CM

## 2022-10-08 DIAGNOSIS — D119 Benign neoplasm of major salivary gland, unspecified: Secondary | ICD-10-CM | POA: Diagnosis not present

## 2022-10-08 DIAGNOSIS — Z01118 Encounter for examination of ears and hearing with other abnormal findings: Secondary | ICD-10-CM | POA: Diagnosis not present

## 2022-10-08 DIAGNOSIS — J302 Other seasonal allergic rhinitis: Secondary | ICD-10-CM

## 2022-10-08 LAB — POCT GLYCOSYLATED HEMOGLOBIN (HGB A1C): HbA1c, POC (controlled diabetic range): 6.2 % (ref 0.0–7.0)

## 2022-10-08 MED ORDER — FLUTICASONE PROPIONATE 50 MCG/ACT NA SUSP: 2.0000 | Freq: Every day | NASAL | 6 refills | Status: DC

## 2022-10-08 MED ORDER — OLOPATADINE HCL 0.2 % OP SOLN: 1.0000 [drp] | Freq: Every day | OPHTHALMIC | 0 refills | Status: DC

## 2022-10-10 ENCOUNTER — Ambulatory Visit: Payer: 59

## 2022-10-10 ENCOUNTER — Telehealth (HOSPITAL_BASED_OUTPATIENT_CLINIC_OR_DEPARTMENT_OTHER): Payer: Self-pay | Admitting: Orthopaedic Surgery

## 2022-10-10 ENCOUNTER — Other Ambulatory Visit (HOSPITAL_BASED_OUTPATIENT_CLINIC_OR_DEPARTMENT_OTHER): Payer: Self-pay | Admitting: Orthopaedic Surgery

## 2022-10-10 DIAGNOSIS — Z96612 Presence of left artificial shoulder joint: Secondary | ICD-10-CM

## 2022-10-10 NOTE — Telephone Encounter (Signed)
Patient needs another referral send to physical therapy because she missed her appointment today. Best contact number for patient [phone]

## 2022-10-10 NOTE — Telephone Encounter (Signed)
Referral placed.

## 2022-10-16 ENCOUNTER — Ambulatory Visit: Payer: 59 | Attending: Audiology | Admitting: Audiology

## 2022-10-16 DIAGNOSIS — H903 Sensorineural hearing loss, bilateral: Secondary | ICD-10-CM | POA: Insufficient documentation

## 2022-10-18 ENCOUNTER — Ambulatory Visit: Payer: 59 | Admitting: Audiology

## 2022-10-18 DIAGNOSIS — H903 Sensorineural hearing loss, bilateral: Secondary | ICD-10-CM

## 2022-10-18 NOTE — Procedures (Signed)
  Outpatient Audiology and [facility] [address] [place], [place]  [address] [phone]  AUDIOLOGICAL  EVALUATION  NAME: Monica Watson     DOB:   July 17, 1955      MRN: [id number]                                                                                     DATE: 10/18/2022     REFERENT: Sabino Dick, DO STATUS: Outpatient DIAGNOSIS: sensorineural hearing loss, bilateral   History: [name] was seen for an audiological evaluation due to concerns regarding her hearing sensitivity. Candida reports decreased hearing occurring for a few months. [name] reports intermittent tinnitus. She denies otalgia, aural fullness, and dizziness. [name] had a mild stroke in 2010 with left sided deficits. [name] is followed by Dr. 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   Evaluation:  Otoscopy showed a clear view of the tympanic membranes, bilaterally Tympanometry results were consistent in the right ear with normal middle ear pressure and  reduced tympanic membrane mobility (Type As) and in the left ear with normal middle ear pressure and normal tympanic membrane mobility (Type A).  Audiometric testing was completed using Conventional Audiometry techniques with insert earphones and TDH headphones. Test results are consistent with normal hearing sensitivity in the left ear with the exception of a mild hearing loss at 1000-2000 Hz. Results in the right ear were consistent with normal hearing sensitivity sloping to a mild to moderate hearing loss 1000-8000 Hz. Conductive component noted at 4000 Hz in the right ear.  Speech Recognition Thresholds were obtained at 35 dB HL in the right ear and at 20  dB HL in the left ear. Word Recognition Testing was completed at 70 dB HL and [name] scored 88% in the right ear and 96% in the left ear.      Results:  The test results were reviewed with [name]. Today's results are consistent  with normal hearing sensitivity in the left ear with the exception of a mild hearing loss at 1000-2000 Hz. Results in the right ear were consistent with normal hearing sensitivity sloping to a mild to moderate hearing loss 1000-8000 Hz. Conductive component noted at 4000 Hz in the right ear. Asymmetry noted at 2000-8000 Hz, worse in the right ear. [name] will have hearing and communication difficulty in adverse listening environments. [name] will benefit from good communication strategies and the use of a hearing aid in the right ear if motivated. Hearing aids were discussed and [name] reported she is not interested in amplification at this time.   Recommendations: 1.   Follow up with Dr. Ernestene Kiel, ENT, for right asymmetric hearing loss 2.   Communication Needs Assessment with an Audiologist if interested in pursuing a hearing aid for the right ear. 3.   Continue to monitor hearing sensitivity. Return in 2-3 years for audiological monitoring.     30 minutes spent testing and counseling on results.   If you have any questions please feel free to contact me at [phone].  Marton Redwood Audiologist, Au.D., CCC-A 10/18/2022  12:46 PM  Cc: Sabino Dick, DO

## 2022-10-22 NOTE — Therapy (Unsigned)
OUTPATIENT PHYSICAL THERAPY SHOULDER EVALUATION   Patient Name: Monica Watson MRN: [id number] DOB:1956-04-10, 67 y.o., female Today's Date: 10/24/2022  END OF SESSION:  PT End of Session - 10/24/22 1203     Visit Number 1    Number of Visits 12    Date for PT Re-Evaluation 12/19/22    Authorization Type U[employer] MCR    PT Start Time 1115    PT Stop Time 1200    PT Time Calculation (min) 45 min    Activity Tolerance Patient tolerated treatment well;Patient limited by pain    Behavior During Therapy WFL for tasks assessed/performed             Past Medical History:  Diagnosis Date   Arthritis    Cerebrovascular disease    Depression    Headache    Hypertension    Stroke (HCC) 2010   Type 2 diabetes mellitus (HCC)    Past Surgical History:  Procedure Laterality Date   bilateral knee replacements      CATARACT EXTRACTION, BILATERAL     FRACTURE SURGERY Right    arm as a child   gallstones removed     I & D KNEE WITH POLY EXCHANGE Right 12/16/2020   Procedure: POLY EXCHANGE RIGHT KNEE;  Surgeon: Kathryne Hitch, MD;  Location: [facility];  Service: Orthopedics;  Laterality: Right;   KNEE ARTHROSCOPY Bilateral    left carpal tunnel release      REVERSE SHOULDER ARTHROPLASTY Left 04/23/2022   Procedure: LEFT REVERSE SHOULDER ARTHROPLASTY;  Surgeon: Huel Cote, MD;  Location: [facility];  Service: Orthopedics;  Laterality: Left;   SHOULDER ARTHROSCOPY Bilateral    THYROIDECTOMY     Patient Active Problem List   Diagnosis Date Noted   Rotator cuff arthropathy of left shoulder 04/23/2022   Type 2 diabetes mellitus without complication, without long-term current use of insulin (HCC) 03/20/2022   Neck mass 03/20/2022   Osteoarthritis 02/19/2022   Depressed mood 02/19/2022   Abnormal uterine bleeding (AUB) 02/08/2022   Internal hemorrhoids 07/28/2021   Viral URI with cough 06/29/2021   Cervical lymphadenopathy 03/11/2021   Status post revision of total replacement of  right knee 12/16/2020   Polyethylene wear of right knee prosthesis, 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/15/2020   Sialadenitis 11/23/2020   Abdominal pain 10/23/2020   Cervical cancer screening 08/10/2020   Bartholin cyst 07/29/2020   History of total knee replacement, left 06/13/2020   History of total knee arthroplasty, right 06/13/2020   Acute right-sided back pain 06/09/2020   Depression, major, single episode, mild (HCC) 05/12/2020   Vision changes 12/31/2019   Bilateral knee pain 10/08/2019   Poor dentition 10/08/2019   Healthcare maintenance 10/08/2019   Chronic left shoulder pain 08/31/2019   Hemorrhoids 04/04/2019   History of stroke 01/01/2019   Hypertension associated with diabetes (HCC) 01/01/2019   Pre-diabetes 01/01/2019   Hot flashes 01/01/2019   Onychomycosis 01/01/2019    PCP: Sabino Dick, DO   REFERRING PROVIDER: Huel Cote, MD   REFERRING DIAG: [phone] (ICD-10-CM) - 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  This is her first PT appointment having cancelled several times in the past. She has been trying to work with her shoulder on her own but still requires pain medication for relief of symptoms. Hand dominance: Left  PERTINENT HISTORY: 22-weeks status post left reverse shoulder arthroplasty.  At this time she has not been doing physical therapy.  I do believe she would benefit from some strengthening and range of motion of his left  shoulder.  Will plan to order this today for the Ortho care [employer] office.  She will continue to advance as tolerated.  PAIN:  Are you having pain? Yes: NPRS scale: 10/10 Pain location: L shoulder Pain description: ache Aggravating factors: sleep positions,reaching Relieving factors: pain meds  PRECAUTIONS: Shoulder L RSA 04/23/22  WEIGHT BEARING RESTRICTIONS: No  FALLS:  Has patient fallen in last 6 months? No  OCCUPATION: Not working  PLOF: Independent  PATIENT GOALS:To increase my L shoulder function  NEXT MD VISIT: 10/31/22  OBJECTIVE:   DIAGNOSTIC FINDINGS:  3 views left shoulder: Status post reverse shoulder arthroplasty without evidence of complication  PATIENT SURVEYS:  FOTO 47(54 predicted)    POSTURE: Forward and depressed L shoulder  UPPER EXTREMITY ROM:   A/PROM Right eval Left eval  Shoulder flexion 135 70/120d  Shoulder extension  45d  Shoulder abduction 135 60/90d  Shoulder adduction    Shoulder internal rotation    Shoulder external rotation    Elbow flexion    Elbow extension    Wrist flexion    Wrist extension    Wrist ulnar deviation    Wrist radial deviation    Wrist pronation    Wrist supination    (Blank rows = not tested)  UPPER EXTREMITY MMT:  MMT Right eval Left eval  Shoulder flexion 4 3+  Shoulder extension 4 4  Shoulder abduction 4 4-  Shoulder adduction    Shoulder internal rotation 4 4  Shoulder external rotation 4 4-  Middle trapezius    Lower trapezius    Elbow flexion    Elbow extension    Wrist flexion    Wrist extension    Wrist ulnar deviation    Wrist radial deviation    Wrist pronation    Wrist supination    Grip strength (lbs) 35 37  (Blank rows = not tested)  SHOULDER SPECIAL TESTS: Deferred based on diagnosis  JOINT MOBILITY TESTING:  deferred   TODAY'S TREATMENT:                                                                                                                                          DATE: 10/23/21 Eval and HEP   PATIENT EDUCATION: Education details: Discussed eval findings, rehab rationale and POC and patient is in agreement  Person  educated: Patient Education method: Explanation Education comprehension: verbalized understanding and needs further education  HOME EXERCISE PROGRAM: Access Code: [email or web address] URL: [email or web address] Date: 10/24/2022 Prepared by: Gustavus Bryant  Exercises - Supine Shoulder Flexion with Dowel  - 3 x daily - 5 x weekly - 1 sets - 10 reps - Supine Shoulder Protraction with Dowel  - 3 x daily - 5 x weekly - 1 sets - 10 reps - Scapular Wall Slides  - 3 x daily - 5 x weekly - 1 sets - 10 reps  ASSESSMENT:  CLINICAL IMPRESSION: Patient is a 67 y.o. female who was seen today for physical therapy evaluation and treatment following L TSA reverse 6 months ago.  She continues to cite high pain levels requiring narcotic pain medication for relief.    OBJECTIVE IMPAIRMENTS: decreased activity tolerance, decreased endurance, decreased knowledge of condition, decreased mobility, decreased ROM, decreased strength, impaired UE functional use, postural dysfunction, and pain.   ACTIVITY LIMITATIONS: carrying and lifting  PERSONAL FACTORS: Age, Education, Fitness, and Time since onset of injury/illness/exacerbation are also affecting patient's functional outcome.   REHAB POTENTIAL: Good  CLINICAL DECISION MAKING: Stable/uncomplicated  EVALUATION COMPLEXITY: Low   GOALS: Goals reviewed with patient? No  SHORT TERM GOALS: Target date: 11/14/2022  Patient to demonstrate independence in HEP  Baseline: 8DPHWQV4 Goal status: INITIAL  2.  Decrease worst pain to 8/10 Baseline: 10/10 Goal status: INITIAL  3.  Increase AROM of abduction and flexion to 90d Baseline:  A/PROM Right eval Left eval  Shoulder flexion 135 70/120d  Shoulder extension  45d  Shoulder abduction 135 60/90d   Goal status:  INITIAL    LONG TERM GOALS: Target date: 12/05/2022  6/10 worst pain Baseline: 10/10 worst pain Goal status: INITIAL  2.  Increase FOTO score to 54 Baseline: 47 Goal status: INITIAL  3.  Increase L shoulder flexion to 135d Baseline:  A/PROM Right eval Left eval  Shoulder flexion 135 70/120d  Shoulder extension  45d  Shoulder abduction 135 60/90d   Goal status: INITIAL  4.  Increase L shoulder strength to 4/5 in deficient areas Baseline:  MMT Right eval Left eval  Shoulder flexion 4 3+  Shoulder extension 4 4  Shoulder abduction 4 4-  Shoulder adduction    Shoulder internal rotation 4 4  Shoulder external rotation 4 4-   Goal status: INITIAL    PLAN:  PT FREQUENCY: 2x/week  PT DURATION: 6 weeks  PLANNED INTERVENTIONS: Therapeutic exercises, Therapeutic activity, Neuromuscular re-education, Balance training, Gait training, Patient/Family education, Self Care, Joint mobilization, Dry Needling, Manual therapy, and Re-evaluation  PLAN FOR NEXT SESSION: HEP review and update, manual techniques as appropriate, aerobic tasks, ROM and flexibility activities, strengthening and PREs, TPDN, gait and balance training as needed     Hildred Laser, PT 10/24/2022, 12:05 PM

## 2022-10-24 ENCOUNTER — Ambulatory Visit: Payer: 59 | Attending: Orthopaedic Surgery

## 2022-10-24 DIAGNOSIS — Z96612 Presence of left artificial shoulder joint: Secondary | ICD-10-CM | POA: Insufficient documentation

## 2022-10-24 DIAGNOSIS — M6281 Muscle weakness (generalized): Secondary | ICD-10-CM | POA: Diagnosis not present

## 2022-10-24 DIAGNOSIS — M25512 Pain in left shoulder: Secondary | ICD-10-CM | POA: Diagnosis not present

## 2022-11-05 NOTE — Therapy (Unsigned)
OUTPATIENT PHYSICAL THERAPY TREATMENT NOTE   Patient Name: Monica Watson MRN: [id number] DOB:1955/07/08, 67 y.o., female Today's Date: 11/08/2022  PCP: Sabino Dick, DO  REFERRING PROVIDER: Huel Cote, MD   END OF SESSION:   PT End of Session - 11/08/22 1624     Visit Number 2    Number of Visits 12    Date for PT Re-Evaluation 12/19/22    Authorization Type UHC MCR    PT Start Time 1620    PT Stop Time 1700    PT Time Calculation (min) 40 min    Activity Tolerance Patient tolerated treatment well;Patient limited by pain    Behavior During Therapy [facility] for tasks assessed/performed             Past Medical History:  Diagnosis Date   Arthritis    Cerebrovascular disease    Depression    Headache    Hypertension    Stroke (HCC) 2010   Type 2 diabetes mellitus (HCC)    Past Surgical History:  Procedure Laterality Date   bilateral knee replacements      CATARACT EXTRACTION, BILATERAL     FRACTURE SURGERY Right    arm as a child   gallstones removed     I & D KNEE WITH POLY EXCHANGE Right 12/16/2020   Procedure: POLY EXCHANGE RIGHT KNEE;  Surgeon: Kathryne Hitch, MD;  Location: [facility];  Service: Orthopedics;  Laterality: Right;   KNEE ARTHROSCOPY Bilateral    left carpal tunnel release      REVERSE SHOULDER ARTHROPLASTY Left 04/23/2022   Procedure: LEFT REVERSE SHOULDER ARTHROPLASTY;  Surgeon: Huel Cote, MD;  Location: [facility];  Service: Orthopedics;  Laterality: Left;   SHOULDER ARTHROSCOPY Bilateral    THYROIDECTOMY     Patient Active Problem List   Diagnosis Date Noted   Rotator cuff arthropathy of left shoulder 04/23/2022   Type 2 diabetes mellitus without complication, without long-term current use of insulin (HCC) 03/20/2022   Neck mass 03/20/2022   Osteoarthritis 02/19/2022   Depressed mood 02/19/2022   Abnormal uterine bleeding (AUB) 02/08/2022   Internal hemorrhoids 07/28/2021   Viral URI with cough 06/29/2021   Cervical  lymphadenopathy 03/11/2021   Status post revision of total replacement of right knee 12/16/2020   Polyethylene wear of right knee prosthesis, 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/15/2020   Sialadenitis 11/23/2020   Abdominal pain 10/23/2020   Cervical cancer screening 08/10/2020   Bartholin cyst 07/29/2020   History of total knee replacement, left 06/13/2020   History of total knee arthroplasty, right 06/13/2020   Acute right-sided back pain 06/09/2020   Depression, major, single episode, mild (HCC) 05/12/2020   Vision changes 12/31/2019   Bilateral knee pain 10/08/2019   Poor dentition 10/08/2019   Healthcare maintenance 10/08/2019   Chronic left shoulder pain 08/31/2019   Hemorrhoids 04/04/2019   History of stroke 01/01/2019   Hypertension associated with diabetes (HCC) 01/01/2019   Pre-diabetes 01/01/2019   Hot flashes 01/01/2019   Onychomycosis 01/01/2019    REFERRING DIAG: W09.811 (ICD-10-CM) - 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  At this time she has not been doing physical therapy.  I do believe she would benefit from some strengthening and range of motion of his left shoulder.  Will plan to order this today for the Ortho care [employer] office.  She will continue to advance as tolerated.   PRECAUTIONS: Shoulder L RSA 04/23/22   SUBJECTIVE:                                                                                                                                                                                      SUBJECTIVE STATEMENT:  L shoulder soreness unchanged.  Has R shoulder injected yesterday   PAIN:  Are you having pain? Yes: NPRS scale: 7/10 Pain location: L  shoulder Pain description: ache Aggravating factors: activity and lying on L side Relieving factors: position change   OBJECTIVE: (objective measures completed at initial evaluation unless otherwise dated)   DIAGNOSTIC FINDINGS:  3 views left shoulder: Status post reverse shoulder arthroplasty without evidence of complication   PATIENT SURVEYS:  FOTO 47(54 predicted)       POSTURE: Forward and depressed L shoulder   UPPER EXTREMITY ROM:    A/PROM Right eval Left eval  Shoulder flexion 135 70/120d  Shoulder extension   45d  Shoulder abduction 135 60/90d  Shoulder adduction      Shoulder internal rotation      Shoulder external rotation      Elbow flexion      Elbow extension      Wrist flexion      Wrist extension      Wrist ulnar deviation      Wrist radial deviation      Wrist pronation      Wrist supination      (Blank rows = not tested)   UPPER EXTREMITY MMT:   MMT Right eval Left eval  Shoulder flexion 4 3+  Shoulder extension 4 4  Shoulder abduction 4 4-  Shoulder adduction      Shoulder internal rotation 4 4  Shoulder external rotation 4 4-  Middle trapezius      Lower trapezius      Elbow flexion      Elbow extension      Wrist flexion      Wrist extension      Wrist ulnar deviation      Wrist radial deviation      Wrist pronation      Wrist supination      Grip strength (lbs) 35 37  (Blank rows = not tested)   SHOULDER SPECIAL TESTS: Deferred based on diagnosis   JOINT MOBILITY TESTING:  deferred             TODAY'S TREATMENT:         [facility] Adult PT Treatment:  DATE: 11/08/22 Therapeutic Exercise: OH pulleys 4 min Supine L flexion 10x inclined Supine abduction 10x inclined LUE row YTB 10x Seated L abduction elbow bent 10x Manual Therapy: Rhythmic stabilization 90d flexion 4 way scapular mobility 10x                                                                                                                                   DATE: 10/23/21 Eval and HEP     PATIENT EDUCATION: Education details: Discussed eval findings, rehab rationale and POC and patient is in agreement  Person educated: Patient Education method: Explanation Education comprehension: verbalized understanding and needs further education   HOME EXERCISE PROGRAM: Access Code: [email or web address] URL: [email or web address] Date: 10/24/2022 Prepared by: Gustavus Bryant   Exercises - Supine Shoulder Flexion with Dowel  - 3 x daily - 5 x weekly - 1 sets - 10 reps - Supine Shoulder Protraction with Dowel  - 3 x daily - 5 x weekly - 1 sets - 10 reps - Scapular Wall Slides  - 3 x daily - 5 x weekly - 1 sets - 10 reps   ASSESSMENT:   CLINICAL IMPRESSION:Patient returns for first f/u.  L shoulder sore.  Incorporated AROM and strengthening emphasizing flexion and abduction.  Assisted patient with scapular stability tasks. Able to complete all tasks w/o aggravation of L shoulder symptoms   OBJECTIVE IMPAIRMENTS: decreased activity tolerance, decreased endurance, decreased knowledge of condition, decreased mobility, decreased ROM, decreased strength, impaired UE functional use, postural dysfunction, and pain.    ACTIVITY LIMITATIONS: carrying and lifting   PERSONAL FACTORS: Age, Education, Fitness, and Time since onset of injury/illness/exacerbation are also affecting patient's functional outcome.    REHAB POTENTIAL: Good   CLINICAL DECISION MAKING: Stable/uncomplicated   EVALUATION COMPLEXITY: Low     GOALS: Goals reviewed with patient? No   SHORT TERM GOALS: Target date: 11/14/2022   Patient to demonstrate independence in HEP  Baseline: 8DPHWQV4 Goal status: INITIAL   2.  Decrease worst pain to 8/10 Baseline: 10/10 Goal status: INITIAL   3.  Increase AROM of abduction and flexion to 90d Baseline:  A/PROM Right eval Left eval  Shoulder flexion 135 70/120d  Shoulder extension   45d   Shoulder abduction 135 60/90d    Goal status: INITIAL       LONG TERM GOALS: Target date: 12/05/2022   6/10 worst pain Baseline: 10/10 worst pain Goal status: INITIAL   2.  Increase FOTO score to 54 Baseline: 47 Goal status: INITIAL   3.  Increase L shoulder flexion to 135d Baseline:  A/PROM Right eval Left eval  Shoulder flexion 135 70/120d  Shoulder extension   45d  Shoulder abduction 135 60/90d    Goal status: INITIAL   4.  Increase L shoulder strength to 4/5 in deficient areas Baseline:  MMT Right eval Left eval  Shoulder flexion 4 3+  Shoulder extension 4 4  Shoulder abduction 4  4-  Shoulder adduction      Shoulder internal rotation 4 4  Shoulder external rotation 4 4-    Goal status: INITIAL       PLAN:   PT FREQUENCY: 2x/week   PT DURATION: 6 weeks   PLANNED INTERVENTIONS: Therapeutic exercises, Therapeutic activity, Neuromuscular re-education, Balance training, Gait training, Patient/Family education, Self Care, Joint mobilization, Dry Needling, Manual therapy, and Re-evaluation   PLAN FOR NEXT SESSION: HEP review and update, manual techniques as appropriate, aerobic tasks, ROM and flexibility activities, strengthening and PREs, TPDN, gait and balance training as needed     Hildred Laser, PT 11/08/2022, 5:03 PM

## 2022-11-07 ENCOUNTER — Ambulatory Visit (INDEPENDENT_AMBULATORY_CARE_PROVIDER_SITE_OTHER): Payer: 59

## 2022-11-07 ENCOUNTER — Ambulatory Visit (INDEPENDENT_AMBULATORY_CARE_PROVIDER_SITE_OTHER): Payer: 59 | Admitting: Orthopaedic Surgery

## 2022-11-07 DIAGNOSIS — Z471 Aftercare following joint replacement surgery: Secondary | ICD-10-CM | POA: Diagnosis not present

## 2022-11-07 DIAGNOSIS — Z96612 Presence of left artificial shoulder joint: Secondary | ICD-10-CM

## 2022-11-07 DIAGNOSIS — M7541 Impingement syndrome of right shoulder: Secondary | ICD-10-CM | POA: Diagnosis not present

## 2022-11-07 MED ORDER — LIDOCAINE HCL 1 % IJ SOLN
4.0000 mL | INTRAMUSCULAR | Status: AC | PRN
Start: 2022-11-07 — End: 2022-11-07
  Administered 2022-11-07: 4 mL

## 2022-11-07 MED ORDER — TRIAMCINOLONE ACETONIDE 40 MG/ML IJ SUSP
80.0000 mg | INTRAMUSCULAR | Status: AC | PRN
Start: 2022-11-07 — End: 2022-11-07
  Administered 2022-11-07: 80 mg via INTRA_ARTICULAR

## 2022-11-07 NOTE — Progress Notes (Signed)
Post Operative Evaluation    Procedure/Date of Surgery: Left reverse shoulder arthroplasty 04/23/22  Interval History:   Presents today for 6 months overall continue to improve.  At today's visit she is overall happy with the left shoulder.  She is having some right shoulder pain which is radiating to the lateral aspect of the shoulder.  She is hoping to pursue an injection today PMH/PSH/Family History/Social History/Meds/Allergies:    Past Medical History:  Diagnosis Date  . Arthritis   . Cerebrovascular disease   . Depression   . Headache   . Hypertension   . Stroke (HCC) 2010  . Type 2 diabetes mellitus (HCC)    Past Surgical History:  Procedure Laterality Date  . bilateral knee replacements     . CATARACT EXTRACTION, BILATERAL    . FRACTURE SURGERY Right    arm as a child  . gallstones removed    . I & D KNEE WITH POLY EXCHANGE Right 12/16/2020   Procedure: POLY EXCHANGE RIGHT KNEE;  Surgeon: Kathryne Hitch, MD;  Location: [facility];  Service: Orthopedics;  Laterality: Right;  . KNEE ARTHROSCOPY Bilateral   . left carpal tunnel release     . REVERSE SHOULDER ARTHROPLASTY Left 04/23/2022   Procedure: LEFT REVERSE SHOULDER ARTHROPLASTY;  Surgeon: Huel Cote, MD;  Location: [facility];  Service: Orthopedics;  Laterality: Left;  . SHOULDER ARTHROSCOPY Bilateral   . THYROIDECTOMY     Social History   Socioeconomic History  . Marital status: Single    Spouse name: Not on file  . Number of children: Not on file  . Years of education: Not on file  . Highest education level: Not on file  Occupational History  . Not on file  Tobacco Use  . Smoking status: Every Day    Packs/day: 0.50    Years: 46.00    Total pack years: 23.00    Types: Cigarettes    Passive exposure: Current  . Smokeless tobacco: Never  Vaping Use  . Vaping Use: Never used  Substance and Sexual Activity  . Alcohol use: Never  . Drug use: Never  . Sexual  activity: Not on file  Other Topics Concern  . Not on file  Social History Narrative   Retired    Right handed   Social Determinants of Health   Financial Resource Strain: Not on file  Food Insecurity: Not on file  Transportation Needs: No Transportation Needs (09/22/2021)   PRAPARE - Transportation   . Lack of Transportation (Medical): No   . Lack of Transportation (Non-Medical): No  Physical Activity: Not on file  Stress: Not on file  Social Connections: Not on file   No family history on file. No Known Allergies Current Outpatient Medications  Medication Sig Dispense Refill  . aspirin EC 325 MG tablet Take 1 tablet (325 mg total) by mouth daily. 30 tablet 0  . atorvastatin (LIPITOR) 40 MG tablet Take 1 tablet (40 mg total) by mouth daily. 90 tablet 3  . benzonatate (TESSALON) 100 MG capsule Take 100 mg by mouth 2 (two) times daily as needed for cough.    . ciclopirox (PENLAC) 8 % solution APPLY 1 COAT TO TOENAIL DAILY, REMOVE WEEKLY WITH POLISH REMOVER 6.6 mL 11  . clonazePAM (KLONOPIN) 0.25 MG disintegrating tablet Take 0.25 mg by mouth  2 (two) times daily as needed.    . fluticasone (FLONASE) 50 MCG/ACT nasal spray Place 2 sprays into both nostrils daily. (Patient taking differently: Place 2 sprays into both nostrils daily as needed for allergies.) 16 g 6  . gabapentin (NEURONTIN) 800 MG tablet Take 1 tablet (800 mg total) by mouth at bedtime. 30 tablet 0  . losartan (COZAAR) 50 MG tablet TAKE 1 TABLET(50 MG) BY MOUTH EVERY DAY FOR BLOOD PRESSURE 30 tablet 1  . metFORMIN (GLUCOPHAGE-XR) 500 MG 24 hr tablet Take one tablet daily for a week. If you do well with that, increase to one tablet in the morning and one in the evening. (Patient taking differently: Take 500 mg by mouth in the morning.) 180 tablet 3  . oxyCODONE-acetaminophen (PERCOCET) 10-325 MG tablet Take 1 tablet by mouth every 6 (six) hours as needed for pain. 30 tablet 0  . polyethylene glycol (MIRALAX) 17 g packet  Take 17 g by mouth daily. (Patient taking differently: Take 17 g by mouth daily as needed (constipation.).) 90 each 0  . promethazine (PHENERGAN) 12.5 MG tablet Take 12.5 mg by mouth daily as needed for nausea.    . psyllium (METAMUCIL) 28 % packet Take 1 packet by mouth 2 (two) times daily. (Patient not taking: Reported on 02/19/2022) 30 each 1  . tiZANidine (ZANAFLEX) 4 MG tablet Take 1 tablet (4 mg total) by mouth every 8 (eight) hours as needed for muscle spasms. 30 tablet 0   No current facility-administered medications for this visit.   No results found.  Review of Systems:   A ROS was performed including pertinent positives and negatives as documented in the HPI.   Musculoskeletal Exam:    There were no vitals taken for this visit.  Incision is well-healed.  Active forward elevation is to 130 degrees with external rotation at side to 30 degrees.  Internal rotation is to back pocket.  Improved strength with forward elevation Imaging:    3 views left shoulder: Status post reverse shoulder arthroplasty without evidence of complication  I personally reviewed and interpreted the radiographs.   Assessment:   Status post left reverse shoulder arthroplasty.  At this time she has not been doing physical therapy.  She does have right shoulder symptoms consistent with subacromial impingement.  She is requesting a right shoulder injection today.  We have provided this  Plan :    -Right shoulder subacromial injection performed after consent obtained    Procedure Note  Patient: Monica Watson             Date of Birth: 11-Apr-1956           MRN: [id number]             Visit Date: 11/07/2022  Procedures: Visit Diagnoses:  1. 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 Consent was given by the patient. Immediately prior to procedure a time out was called to verify the correct patient, procedure, equipment, support staff and site/side marked as required. Patient was prepped and draped in the usual sterile fashion.          I personally saw and evaluated the  patient, and participated in the management and treatment plan.  Vanetta Mulders, MD Attending Physician, Orthopedic Surgery  This document was dictated using Dragon voice recognition software. A reasonable attempt at proof reading has been made to minimize errors.

## 2022-11-08 ENCOUNTER — Ambulatory Visit: Payer: 59

## 2022-11-08 DIAGNOSIS — M25512 Pain in left shoulder: Secondary | ICD-10-CM

## 2022-11-08 DIAGNOSIS — M6281 Muscle weakness (generalized): Secondary | ICD-10-CM

## 2022-11-08 DIAGNOSIS — Z96612 Presence of left artificial shoulder joint: Secondary | ICD-10-CM

## 2022-11-09 NOTE — Therapy (Deleted)
OUTPATIENT PHYSICAL THERAPY TREATMENT NOTE   Patient Name: Monica Watson MRN: [id number] DOB:08/20/1955, 67 y.o., female Today's Date: 11/08/2022  PCP: Espinoza, Alejandra, DO  REFERRING PROVIDER: Bokshan, Steven, MD   END OF SESSION:   PT End of Session - 11/08/22 1624     Visit Number 2    Number of Visits 12    Date for PT Re-Evaluation 12/19/22    Authorization Type [employer] MCR    PT Start Time 1620    PT Stop Time 1700    PT Time Calculation (min) 40 min    Activity Tolerance Patient tolerated treatment well;Patient limited by pain    Behavior During Therapy WFL for tasks assessed/performed             Past Medical History:  Diagnosis Date   Arthritis    Cerebrovascular disease    Depression    Headache    Hypertension    Stroke (HCC) 2010   Type 2 diabetes mellitus (HCC)    Past Surgical History:  Procedure Laterality Date   bilateral knee replacements      CATARACT EXTRACTION, BILATERAL     FRACTURE SURGERY Right    arm as a child   gallstones removed     I & D KNEE WITH POLY EXCHANGE Right 12/16/2020   Procedure: POLY EXCHANGE RIGHT KNEE;  Surgeon: Blackman, Christopher Y, MD;  Location: [facility];  Service: Orthopedics;  Laterality: Right;   KNEE ARTHROSCOPY Bilateral    left carpal tunnel release      REVERSE SHOULDER ARTHROPLASTY Left 04/23/2022   Procedure: LEFT REVERSE SHOULDER ARTHROPLASTY;  Surgeon: Bokshan, Steven, MD;  Location: [facility];  Service: Orthopedics;  Laterality: Left;   SHOULDER ARTHROSCOPY Bilateral    THYROIDECTOMY     Patient Active Problem List   Diagnosis Date Noted   Rotator cuff arthropathy of left shoulder 04/23/2022   Type 2 diabetes mellitus without complication, without long-term current use of insulin (HCC) 03/20/2022   Neck mass 03/20/2022   Osteoarthritis 02/19/2022   Depressed mood 02/19/2022   Abnormal uterine bleeding (AUB) 02/08/2022   Internal hemorrhoids 07/28/2021   Viral URI with cough 06/29/2021   Cervical  lymphadenopathy 03/11/2021   Status post revision of total replacement of right knee 12/16/2020   Polyethylene wear of right knee prosthesis, 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/15/2020   Sialadenitis 11/23/2020   Abdominal pain 10/23/2020   Cervical cancer screening 08/10/2020   Bartholin cyst 07/29/2020   History of total knee replacement, left 06/13/2020   History of total knee arthroplasty, right 06/13/2020   Acute right-sided back pain 06/09/2020   Depression, major, single episode, mild (HCC) 05/12/2020   Vision changes 12/31/2019   Bilateral knee pain 10/08/2019   Poor dentition 10/08/2019   Healthcare maintenance 10/08/2019   Chronic left shoulder pain 08/31/2019   Hemorrhoids 04/04/2019   History of stroke 01/01/2019   Hypertension associated with diabetes (HCC) 01/01/2019   Pre-diabetes 01/01/2019   Hot flashes 01/01/2019   Onychomycosis 01/01/2019    REFERRING DIAG: Z96.612 (ICD-10-CM) - 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  At this time she has not been doing physical therapy.  I do believe she would benefit from some strengthening and range of motion of his left shoulder.    Will plan to order this today for the [facility] [address] [facility] office.  She will continue to advance as tolerated.   PRECAUTIONS: Shoulder L RSA 04/23/22   SUBJECTIVE:                                                                                                                                                                                      SUBJECTIVE STATEMENT:  L shoulder soreness unchanged.  Has R shoulder injected yesterday   PAIN:  Are you having pain? Yes: NPRS scale: 7/10 Pain location: L  shoulder Pain description: ache Aggravating factors: activity and lying on L side Relieving factors: position change   OBJECTIVE: (objective measures completed at initial evaluation unless otherwise dated)   DIAGNOSTIC FINDINGS:  3 views left shoulder: Status post reverse shoulder arthroplasty without evidence of complication   PATIENT SURVEYS:  FOTO 47(54 predicted)       POSTURE: Forward and depressed L shoulder   UPPER EXTREMITY ROM:    A/PROM Right eval Left eval  Shoulder flexion 135 70/120d  Shoulder extension   45d  Shoulder abduction 135 60/90d  Shoulder adduction      Shoulder internal rotation      Shoulder external rotation      Elbow flexion      Elbow extension      Wrist flexion      Wrist extension      Wrist ulnar deviation      Wrist radial deviation      Wrist pronation      Wrist supination      (Blank rows = not tested)   UPPER EXTREMITY MMT:   MMT Right eval Left eval  Shoulder flexion 4 3+  Shoulder extension 4 4  Shoulder abduction 4 4-  Shoulder adduction      Shoulder internal rotation 4 4  Shoulder external rotation 4 4-  Middle trapezius      Lower trapezius      Elbow flexion      Elbow extension      Wrist flexion      Wrist extension      Wrist ulnar deviation      Wrist radial deviation      Wrist pronation      Wrist supination      Grip strength (lbs) 35 37  (Blank rows = not tested)   SHOULDER SPECIAL TESTS: Deferred based on diagnosis   JOINT MOBILITY TESTING:  deferred             TODAY'S TREATMENT:         [facility] Adult PT Treatment:                                                  DATE: 11/08/22 Therapeutic Exercise: OH pulleys 4 min Supine L flexion 10x inclined Supine abduction 10x inclined LUE row YTB 10x Seated L abduction elbow bent 10x Manual Therapy: Rhythmic stabilization 90d flexion 4 way scapular mobility 10x                                                                                                                                   DATE: 10/23/21 Eval and HEP     PATIENT EDUCATION: Education details: Discussed eval findings, rehab rationale and POC and patient is in agreement  Person educated: Patient Education method: Explanation Education comprehension: verbalized understanding and needs further education   HOME EXERCISE PROGRAM: Access Code: [email or web address] URL: [email or web address] Date: 10/24/2022 Prepared by: Eliasar Hlavaty   Exercises - Supine Shoulder Flexion with Dowel  - 3 x daily - 5 x weekly - 1 sets - 10 reps - Supine Shoulder Protraction with Dowel  - 3 x daily - 5 x weekly - 1 sets - 10 reps - Scapular Wall Slides  - 3 x daily - 5 x weekly - 1 sets - 10 reps   ASSESSMENT:   CLINICAL IMPRESSION:Patient returns for first f/u.  L shoulder sore.  Incorporated AROM and strengthening emphasizing flexion and abduction.  Assisted patient with scapular stability tasks. Able to complete all tasks w/o aggravation of L shoulder symptoms   OBJECTIVE IMPAIRMENTS: decreased activity tolerance, decreased endurance, decreased knowledge of condition, decreased mobility, decreased ROM, decreased strength, impaired UE functional use, postural dysfunction, and pain.    ACTIVITY LIMITATIONS: carrying and lifting   PERSONAL FACTORS: Age, Education, Fitness, and Time since onset of injury/illness/exacerbation are also affecting patient's functional outcome.    REHAB POTENTIAL: Good   CLINICAL DECISION MAKING: Stable/uncomplicated   EVALUATION COMPLEXITY: Low     GOALS: Goals reviewed with patient? No   SHORT TERM GOALS: Target date: 11/14/2022   Patient to demonstrate independence in HEP  Baseline: [id number] Goal status: INITIAL   2.  Decrease worst pain to 8/10 Baseline: 10/10 Goal status: INITIAL   3.  Increase AROM of abduction and flexion to 90d Baseline:  A/PROM Right eval Left eval  Shoulder flexion 135 70/120d  Shoulder extension   45d   Shoulder abduction 135 60/90d    Goal status: INITIAL       LONG TERM GOALS: Target date: 12/05/2022   6/10 worst pain Baseline: 10/10 worst pain Goal status: INITIAL   2.  Increase FOTO score to 54 Baseline: 47 Goal status: INITIAL   3.  Increase L shoulder flexion to 135d Baseline:  A/PROM Right eval Left eval  Shoulder flexion 135 70/120d  Shoulder extension   45d  Shoulder abduction 135 60/90d    Goal status: INITIAL   4.  Increase L shoulder strength to 4/5 in deficient areas Baseline:  MMT Right eval Left eval  Shoulder flexion 4 3+  Shoulder extension 4 4  Shoulder abduction 4   4-  Shoulder adduction      Shoulder internal rotation 4 4  Shoulder external rotation 4 4-    Goal status: INITIAL       PLAN:   PT FREQUENCY: 2x/week   PT DURATION: 6 weeks   PLANNED INTERVENTIONS: Therapeutic exercises, Therapeutic activity, Neuromuscular re-education, Balance training, Gait training, Patient/Family education, Self Care, Joint mobilization, Dry Needling, Manual therapy, and Re-evaluation   PLAN FOR NEXT SESSION: HEP review and update, manual techniques as appropriate, aerobic tasks, ROM and flexibility activities, strengthening and PREs, TPDN, gait and balance training as needed     Satya Buttram M Sadiq Mccauley, PT 11/08/2022, 5:03 PM     

## 2022-11-12 ENCOUNTER — Ambulatory Visit: Payer: 59

## 2022-11-14 ENCOUNTER — Ambulatory Visit: Payer: 59

## 2022-11-14 DIAGNOSIS — Z96612 Presence of left artificial shoulder joint: Secondary | ICD-10-CM | POA: Diagnosis not present

## 2022-11-14 DIAGNOSIS — M25512 Pain in left shoulder: Secondary | ICD-10-CM | POA: Diagnosis not present

## 2022-11-14 DIAGNOSIS — M6281 Muscle weakness (generalized): Secondary | ICD-10-CM | POA: Diagnosis not present

## 2022-11-14 NOTE — Therapy (Signed)
OUTPATIENT PHYSICAL THERAPY TREATMENT NOTE   Patient Name: Monica Watson MRN: [id number] DOB:[birth year], 67 y.o., female Today's Date: 11/14/2022  PCP: Sabino Dick, DO  REFERRING PROVIDER: Huel Cote, MD   END OF SESSION:   PT End of Session - 11/14/22 1516     Visit Number 3    Number of Visits 12    Date for PT Re-Evaluation 12/19/22    Authorization Type U[employer] MCR    PT Start Time 1530    PT Stop Time 1610    PT Time Calculation (min) 40 min    Activity Tolerance Patient tolerated treatment well;Patient limited by pain    Behavior During Therapy [facility] for tasks assessed/performed              Past Medical History:  Diagnosis Date   Arthritis    Cerebrovascular disease    Depression    Headache    Hypertension    Stroke (HCC) 2010   Type 2 diabetes mellitus (HCC)    Past Surgical History:  Procedure Laterality Date   bilateral knee replacements      CATARACT EXTRACTION, BILATERAL     FRACTURE SURGERY Right    arm as a child   gallstones removed     I & D KNEE WITH POLY EXCHANGE Right 12/16/2020   Procedure: POLY EXCHANGE RIGHT KNEE;  Surgeon: Kathryne Hitch, MD;  Location: [facility];  Service: Orthopedics;  Laterality: Right;   KNEE ARTHROSCOPY Bilateral    left carpal tunnel release      REVERSE SHOULDER ARTHROPLASTY Left 04/23/2022   Procedure: LEFT REVERSE SHOULDER ARTHROPLASTY;  Surgeon: Huel Cote, MD;  Location: [facility];  Service: Orthopedics;  Laterality: Left;   SHOULDER ARTHROSCOPY Bilateral    THYROIDECTOMY     Patient Active Problem List   Diagnosis Date Noted   Rotator cuff arthropathy of left shoulder 04/23/2022   Type 2 diabetes mellitus without complication, without long-term current use of insulin (HCC) 03/20/2022   Neck mass 03/20/2022   Osteoarthritis 02/19/2022   Depressed mood 02/19/2022   Abnormal uterine bleeding (AUB) 02/08/2022   Internal hemorrhoids 07/28/2021   Viral URI with cough 06/29/2021   Cervical  lymphadenopathy 03/11/2021   Status post revision of total replacement of right knee 12/16/2020   Polyethylene wear of right knee prosthesis, 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/15/2020   Sialadenitis 11/23/2020   Abdominal pain 10/23/2020   Cervical cancer screening 08/10/2020   Bartholin cyst 07/29/2020   History of total knee replacement, left 06/13/2020   History of total knee arthroplasty, right 06/13/2020   Acute right-sided back pain 06/09/2020   Depression, major, single episode, mild (HCC) 05/12/2020   Vision changes 12/31/2019   Bilateral knee pain 10/08/2019   Poor dentition 10/08/2019   Healthcare maintenance 10/08/2019   Chronic left shoulder pain 08/31/2019   Hemorrhoids 04/04/2019   History of stroke 01/01/2019   Hypertension associated with diabetes (HCC) 01/01/2019   Pre-diabetes 01/01/2019   Hot flashes 01/01/2019   Onychomycosis 01/01/2019    REFERRING DIAG: W09.811 (ICD-10-CM) - 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  At this time she has not been doing physical therapy.  I do believe she would benefit from some strengthening and range of motion of his left shoulder.  Will plan to order  this today for the [facility].  She will continue to advance as tolerated.   PRECAUTIONS: Shoulder L RSA 04/23/22   SUBJECTIVE:                                                                                                                                                                                      SUBJECTIVE STATEMENT:  Pt presents to PT with continued L shoulder pain.    PAIN:  Are you having pain?  Yes: NPRS scale: 7/10 Pain location: L shoulder Pain description: ache Aggravating factors:  activity and lying on L side Relieving factors: position change   OBJECTIVE: (objective measures completed at initial evaluation unless otherwise dated)   DIAGNOSTIC FINDINGS:  3 views left shoulder: Status post reverse shoulder arthroplasty without evidence of complication   PATIENT SURVEYS:  FOTO 47(54 predicted)   POSTURE: Forward and depressed L shoulder   UPPER EXTREMITY ROM:    A/PROM Right eval Left eval  Shoulder flexion 135 70/120d  Shoulder extension   45d  Shoulder abduction 135 60/90d  Shoulder adduction      Shoulder internal rotation      Shoulder external rotation      Elbow flexion      Elbow extension      Wrist flexion      Wrist extension      Wrist ulnar deviation      Wrist radial deviation      Wrist pronation      Wrist supination      (Blank rows = not tested)   UPPER EXTREMITY MMT:   MMT Right eval Left eval  Shoulder flexion 4 3+  Shoulder extension 4 4  Shoulder abduction 4 4-  Shoulder adduction      Shoulder internal rotation 4 4  Shoulder external rotation 4 4-  Middle trapezius      Lower trapezius      Elbow flexion      Elbow extension      Wrist flexion      Wrist extension      Wrist ulnar deviation      Wrist radial deviation      Wrist pronation      Wrist supination      Grip strength (lbs) 35 37  (Blank rows = not tested)   SHOULDER SPECIAL TESTS: Deferred based on diagnosis   JOINT MOBILITY TESTING:  deferred             TODAY'S TREATMENT:         [facility] Adult PT Treatment:  DATE: 11/14/22 Therapeutic Exercise: Pulley shoulder flex x 3 min  Row 2x10 RTB Inclined L shoulder flex x 10 1# Inclined dow flexion 2x10 Inclined chest press 2x10 Shoulder ext 2x10 GTB Seated bilateral ER x 10 YTB Seated horizontal abd x 10 YTB  [facility] Adult PT Treatment:                                                DATE: 11/08/22 Therapeutic Exercise: OH pulleys 4 min Supine L  flexion 10x inclined Supine abduction 10x inclined LUE row YTB 10x Seated L abduction elbow bent 10x Manual Therapy: Rhythmic stabilization 90d flexion 4 way scapular mobility 10x                                                                                                                                  DATE: 10/23/21 Eval and HEP     PATIENT EDUCATION: Education details: Discussed eval findings, rehab rationale and POC and patient is in agreement  Person educated: Patient Education method: Explanation Education comprehension: verbalized understanding and needs further education   HOME EXERCISE PROGRAM: Access Code: [email or web address] URL: [email or web address] Date: 10/24/2022 Prepared by: Gustavus Bryant   Exercises - Supine Shoulder Flexion with Dowel  - 3 x daily - 5 x weekly - 1 sets - 10 reps - Supine Shoulder Protraction with Dowel  - 3 x daily - 5 x weekly - 1 sets - 10 reps - Scapular Wall Slides  - 3 x daily - 5 x weekly - 1 sets - 10 reps   ASSESSMENT:   CLINICAL IMPRESSION: Pt was able to complete all prescribed exercises with no adverse effect. Therapy focused on dynamic stability of L shoulder. Pt progressing as expected with therapy, will continue per POC.    OBJECTIVE IMPAIRMENTS: decreased activity tolerance, decreased endurance, decreased knowledge of condition, decreased mobility, decreased ROM, decreased strength, impaired UE functional use, postural dysfunction, and pain.    ACTIVITY LIMITATIONS: carrying and lifting   PERSONAL FACTORS: Age, Education, Fitness, and Time since onset of injury/illness/exacerbation are also affecting patient's functional outcome.      GOALS: Goals reviewed with patient? No   SHORT TERM GOALS: Target date: 11/14/2022   Patient to demonstrate independence in HEP  Baseline: 8DPHWQV4 Goal status: INITIAL   2.  Decrease worst pain to 8/10 Baseline: 10/10 Goal status: INITIAL   3.  Increase AROM of abduction and  flexion to 90d Baseline:  A/PROM Right eval Left eval  Shoulder flexion 135 70/120d  Shoulder extension   45d  Shoulder abduction 135 60/90d    Goal status: INITIAL       LONG TERM GOALS: Target date: 12/05/2022   6/10 worst pain Baseline: 10/10 worst pain Goal status: INITIAL   2.  Increase FOTO score to 54 Baseline:  47 Goal status: INITIAL   3.  Increase L shoulder flexion to 135d Baseline:  A/PROM Right eval Left eval  Shoulder flexion 135 70/120d  Shoulder extension   45d  Shoulder abduction 135 60/90d    Goal status: INITIAL   4.  Increase L shoulder strength to 4/5 in deficient areas Baseline:  MMT Right eval Left eval  Shoulder flexion 4 3+  Shoulder extension 4 4  Shoulder abduction 4 4-  Shoulder adduction      Shoulder internal rotation 4 4  Shoulder external rotation 4 4-    Goal status: INITIAL       PLAN:   PT FREQUENCY: 2x/week   PT DURATION: 6 weeks   PLANNED INTERVENTIONS: Therapeutic exercises, Therapeutic activity, Neuromuscular re-education, Balance training, Gait training, Patient/Family education, Self Care, Joint mobilization, Dry Needling, Manual therapy, and Re-evaluation   PLAN FOR NEXT SESSION: HEP review and update, manual techniques as appropriate, aerobic tasks, ROM and flexibility activities, strengthening and PREs, TPDN, gait and balance training as needed     Eloy End, PT 11/14/2022, 4:16 PM

## 2022-11-19 DIAGNOSIS — Z79899 Other long term (current) drug therapy: Secondary | ICD-10-CM | POA: Diagnosis not present

## 2022-11-19 DIAGNOSIS — G8929 Other chronic pain: Secondary | ICD-10-CM | POA: Diagnosis not present

## 2022-11-19 DIAGNOSIS — Z96612 Presence of left artificial shoulder joint: Secondary | ICD-10-CM | POA: Diagnosis not present

## 2022-11-19 DIAGNOSIS — M5416 Radiculopathy, lumbar region: Secondary | ICD-10-CM | POA: Diagnosis not present

## 2022-11-19 DIAGNOSIS — M25512 Pain in left shoulder: Secondary | ICD-10-CM | POA: Diagnosis not present

## 2022-11-21 ENCOUNTER — Ambulatory Visit: Payer: 59

## 2022-11-22 DIAGNOSIS — Z79899 Other long term (current) drug therapy: Secondary | ICD-10-CM | POA: Diagnosis not present

## 2022-11-23 ENCOUNTER — Ambulatory Visit: Payer: 59

## 2022-11-23 NOTE — Therapy (Deleted)
OUTPATIENT PHYSICAL THERAPY TREATMENT NOTE   Patient Name: Monica Watson MRN: [id number] DOB:07/20/1955, 67 y.o., female Today's Date: 11/23/2022  PCP: Sabino Dick, DO  REFERRING PROVIDER: Huel Cote, MD   END OF SESSION:      Past Medical History:  Diagnosis Date   Arthritis    Cerebrovascular disease    Depression    Headache    Hypertension    Stroke [facility]) 2010   Type 2 diabetes mellitus (HCC)    Past Surgical History:  Procedure Laterality Date   bilateral knee replacements      CATARACT EXTRACTION, BILATERAL     FRACTURE SURGERY Right    arm as a child   gallstones removed     I & D KNEE WITH POLY EXCHANGE Right 12/16/2020   Procedure: POLY EXCHANGE RIGHT KNEE;  Surgeon: Kathryne Hitch, MD;  Location: [facility];  Service: Orthopedics;  Laterality: Right;   KNEE ARTHROSCOPY Bilateral    left carpal tunnel release      REVERSE SHOULDER ARTHROPLASTY Left 04/23/2022   Procedure: LEFT REVERSE SHOULDER ARTHROPLASTY;  Surgeon: Huel Cote, MD;  Location: [facility];  Service: Orthopedics;  Laterality: Left;   SHOULDER ARTHROSCOPY Bilateral    THYROIDECTOMY     Patient Active Problem List   Diagnosis Date Noted   Rotator cuff arthropathy of left shoulder 04/23/2022   Type 2 diabetes mellitus without complication, without long-term current use of insulin (HCC) 03/20/2022   Neck mass 03/20/2022   Osteoarthritis 02/19/2022   Depressed mood 02/19/2022   Abnormal uterine bleeding (AUB) 02/08/2022   Internal hemorrhoids 07/28/2021   Viral URI with cough 06/29/2021   Cervical lymphadenopathy 03/11/2021   Status post revision of total replacement of right knee 12/16/2020   Polyethylene wear of right knee prosthesis, 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/15/2020   Sialadenitis 11/23/2020   Abdominal pain 10/23/2020   Cervical cancer screening 08/10/2020   Bartholin cyst 07/29/2020   History of total knee replacement, left 06/13/2020   History of total knee  arthroplasty, right 06/13/2020   Acute right-sided back pain 06/09/2020   Depression, major, single episode, mild (HCC) 05/12/2020   Vision changes 12/31/2019   Bilateral knee pain 10/08/2019   Poor dentition 10/08/2019   Healthcare maintenance 10/08/2019   Chronic left shoulder pain 08/31/2019   Hemorrhoids 04/04/2019   History of stroke 01/01/2019   Hypertension associated with diabetes (HCC) 01/01/2019   Pre-diabetes 01/01/2019   Hot flashes 01/01/2019   Onychomycosis 01/01/2019    REFERRING DIAG: W09.811 (ICD-10-CM) - 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  Rationale for Evaluation and Treatment Rehabilitation  PERTINENT HISTORY: 22-weeks status post left reverse shoulder arthroplasty.  At this time she has not been doing physical therapy.  I do believe she would benefit from some strengthening and range of motion of his left shoulder.  Will plan to order this today for the Ortho care [employer] office.  She will continue to advance as tolerated.   PRECAUTIONS: Shoulder L RSA 04/23/22   SUBJECTIVE:  SUBJECTIVE STATEMENT:  Pt presents to PT with continued L shoulder pain.    PAIN:  Are you having pain?  Yes: NPRS scale: 7/10 Pain location: L shoulder Pain description: ache Aggravating factors: activity and lying on L side Relieving factors: position change   OBJECTIVE: (objective measures completed at initial evaluation unless otherwise dated)   DIAGNOSTIC FINDINGS:  3 views left shoulder: Status post reverse shoulder arthroplasty without evidence of complication   PATIENT SURVEYS:  FOTO 47(54 predicted)   POSTURE: Forward and depressed L shoulder   UPPER EXTREMITY ROM:    A/PROM Right eval Left eval  Shoulder  flexion 135 70/120d  Shoulder extension   45d  Shoulder abduction 135 60/90d  Shoulder adduction      Shoulder internal rotation      Shoulder external rotation      Elbow flexion      Elbow extension      Wrist flexion      Wrist extension      Wrist ulnar deviation      Wrist radial deviation      Wrist pronation      Wrist supination      (Blank rows = not tested)   UPPER EXTREMITY MMT:   MMT Right eval Left eval  Shoulder flexion 4 3+  Shoulder extension 4 4  Shoulder abduction 4 4-  Shoulder adduction      Shoulder internal rotation 4 4  Shoulder external rotation 4 4-  Middle trapezius      Lower trapezius      Elbow flexion      Elbow extension      Wrist flexion      Wrist extension      Wrist ulnar deviation      Wrist radial deviation      Wrist pronation      Wrist supination      Grip strength (lbs) 35 37  (Blank rows = not tested)   SHOULDER SPECIAL TESTS: Deferred based on diagnosis   JOINT MOBILITY TESTING:  deferred             TODAY'S TREATMENT:         [facility] Adult PT Treatment:                                                DATE: 11/14/22 Therapeutic Exercise: Pulley shoulder flex x 3 min  Row 2x10 RTB Inclined L shoulder flex x 10 1# Inclined dow flexion 2x10 Inclined chest press 2x10 Shoulder ext 2x10 GTB Seated bilateral ER x 10 YTB Seated horizontal abd x 10 YTB  [facility] Adult PT Treatment:                                                DATE: 11/08/22 Therapeutic Exercise: OH pulleys 4 min Supine L flexion 10x inclined Supine abduction 10x inclined LUE row YTB 10x Seated L abduction elbow bent 10x Manual Therapy: Rhythmic stabilization 90d flexion 4 way scapular mobility 10x  DATE: 10/23/21 Eval and HEP     PATIENT EDUCATION: Education details: Discussed eval findings, rehab rationale and POC and patient  is in agreement  Person educated: Patient Education method: Explanation Education comprehension: verbalized understanding and needs further education   HOME EXERCISE PROGRAM: Access Code: [email or web address] URL: [email or web address] Date: 10/24/2022 Prepared by: Gustavus Bryant   Exercises - Supine Shoulder Flexion with Dowel  - 3 x daily - 5 x weekly - 1 sets - 10 reps - Supine Shoulder Protraction with Dowel  - 3 x daily - 5 x weekly - 1 sets - 10 reps - Scapular Wall Slides  - 3 x daily - 5 x weekly - 1 sets - 10 reps   ASSESSMENT:   CLINICAL IMPRESSION: Pt was able to complete all prescribed exercises with no adverse effect. Therapy focused on dynamic stability of L shoulder. Pt progressing as expected with therapy, will continue per POC.    OBJECTIVE IMPAIRMENTS: decreased activity tolerance, decreased endurance, decreased knowledge of condition, decreased mobility, decreased ROM, decreased strength, impaired UE functional use, postural dysfunction, and pain.    ACTIVITY LIMITATIONS: carrying and lifting   PERSONAL FACTORS: Age, Education, Fitness, and Time since onset of injury/illness/exacerbation are also affecting patient's functional outcome.      GOALS: Goals reviewed with patient? No   SHORT TERM GOALS: Target date: 11/14/2022   Patient to demonstrate independence in HEP  Baseline: 8DPHWQV4 Goal status: INITIAL   2.  Decrease worst pain to 8/10 Baseline: 10/10 Goal status: INITIAL   3.  Increase AROM of abduction and flexion to 90d Baseline:  A/PROM Right eval Left eval  Shoulder flexion 135 70/120d  Shoulder extension   45d  Shoulder abduction 135 60/90d    Goal status: INITIAL       LONG TERM GOALS: Target date: 12/05/2022   6/10 worst pain Baseline: 10/10 worst pain Goal status: INITIAL   2.  Increase FOTO score to 54 Baseline: 47 Goal status: INITIAL   3.  Increase L shoulder flexion to 135d Baseline:  A/PROM Right eval Left eval   Shoulder flexion 135 70/120d  Shoulder extension   45d  Shoulder abduction 135 60/90d    Goal status: INITIAL   4.  Increase L shoulder strength to 4/5 in deficient areas Baseline:  MMT Right eval Left eval  Shoulder flexion 4 3+  Shoulder extension 4 4  Shoulder abduction 4 4-  Shoulder adduction      Shoulder internal rotation 4 4  Shoulder external rotation 4 4-    Goal status: INITIAL       PLAN:   PT FREQUENCY: 2x/week   PT DURATION: 6 weeks   PLANNED INTERVENTIONS: Therapeutic exercises, Therapeutic activity, Neuromuscular re-education, Balance training, Gait training, Patient/Family education, Self Care, Joint mobilization, Dry Needling, Manual therapy, and Re-evaluation   PLAN FOR NEXT SESSION: HEP review and update, manual techniques as appropriate, aerobic tasks, ROM and flexibility activities, strengthening and PREs, TPDN, gait and balance training as needed     Hildred Laser, PT 11/23/2022, 9:47 AM

## 2022-11-26 NOTE — Therapy (Deleted)
OUTPATIENT PHYSICAL THERAPY TREATMENT NOTE   Patient Name: Monica Watson MRN: [id number] DOB:11-05-1955, 67 y.o., female Today's Date: 11/23/2022  PCP: Sabino Dick, DO  REFERRING PROVIDER: Huel Cote, MD   END OF SESSION:      Past Medical History:  Diagnosis Date   Arthritis    Cerebrovascular disease    Depression    Headache    Hypertension    Stroke [facility]) 2010   Type 2 diabetes mellitus (HCC)    Past Surgical History:  Procedure Laterality Date   bilateral knee replacements      CATARACT EXTRACTION, BILATERAL     FRACTURE SURGERY Right    arm as a child   gallstones removed     I & D KNEE WITH POLY EXCHANGE Right 12/16/2020   Procedure: POLY EXCHANGE RIGHT KNEE;  Surgeon: Kathryne Hitch, MD;  Location: [facility];  Service: Orthopedics;  Laterality: Right;   KNEE ARTHROSCOPY Bilateral    left carpal tunnel release      REVERSE SHOULDER ARTHROPLASTY Left 04/23/2022   Procedure: LEFT REVERSE SHOULDER ARTHROPLASTY;  Surgeon: Huel Cote, MD;  Location: [facility];  Service: Orthopedics;  Laterality: Left;   SHOULDER ARTHROSCOPY Bilateral    THYROIDECTOMY     Patient Active Problem List   Diagnosis Date Noted   Rotator cuff arthropathy of left shoulder 04/23/2022   Type 2 diabetes mellitus without complication, without long-term current use of insulin (HCC) 03/20/2022   Neck mass 03/20/2022   Osteoarthritis 02/19/2022   Depressed mood 02/19/2022   Abnormal uterine bleeding (AUB) 02/08/2022   Internal hemorrhoids 07/28/2021   Viral URI with cough 06/29/2021   Cervical lymphadenopathy 03/11/2021   Status post revision of total replacement of right knee 12/16/2020   Polyethylene wear of right knee prosthesis, 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/15/2020   Sialadenitis 11/23/2020   Abdominal pain 10/23/2020   Cervical cancer screening 08/10/2020   Bartholin cyst 07/29/2020   History of total knee replacement, left 06/13/2020   History of total knee  arthroplasty, right 06/13/2020   Acute right-sided back pain 06/09/2020   Depression, major, single episode, mild (HCC) 05/12/2020   Vision changes 12/31/2019   Bilateral knee pain 10/08/2019   Poor dentition 10/08/2019   Healthcare maintenance 10/08/2019   Chronic left shoulder pain 08/31/2019   Hemorrhoids 04/04/2019   History of stroke 01/01/2019   Hypertension associated with diabetes (HCC) 01/01/2019   Pre-diabetes 01/01/2019   Hot flashes 01/01/2019   Onychomycosis 01/01/2019    REFERRING DIAG: W09.811 (ICD-10-CM) - 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  Rationale for Evaluation and Treatment Rehabilitation  PERTINENT HISTORY: 22-weeks status post left reverse shoulder arthroplasty.  At this time she has not been doing physical therapy.  I do believe she would benefit from some strengthening and range of motion of his left shoulder.  Will plan to order this today for the Ortho care [employer] office.  She will continue to advance as tolerated.   PRECAUTIONS: Shoulder L RSA 04/23/22   SUBJECTIVE:  SUBJECTIVE STATEMENT:  Pt presents to PT with continued L shoulder pain.    PAIN:  Are you having pain?  Yes: NPRS scale: 7/10 Pain location: L shoulder Pain description: ache Aggravating factors: activity and lying on L side Relieving factors: position change   OBJECTIVE: (objective measures completed at initial evaluation unless otherwise dated)   DIAGNOSTIC FINDINGS:  3 views left shoulder: Status post reverse shoulder arthroplasty without evidence of complication   PATIENT SURVEYS:  FOTO 47(54 predicted)   POSTURE: Forward and depressed L shoulder   UPPER EXTREMITY ROM:    A/PROM Right eval Left eval  Shoulder  flexion 135 70/120d  Shoulder extension   45d  Shoulder abduction 135 60/90d  Shoulder adduction      Shoulder internal rotation      Shoulder external rotation      Elbow flexion      Elbow extension      Wrist flexion      Wrist extension      Wrist ulnar deviation      Wrist radial deviation      Wrist pronation      Wrist supination      (Blank rows = not tested)   UPPER EXTREMITY MMT:   MMT Right eval Left eval  Shoulder flexion 4 3+  Shoulder extension 4 4  Shoulder abduction 4 4-  Shoulder adduction      Shoulder internal rotation 4 4  Shoulder external rotation 4 4-  Middle trapezius      Lower trapezius      Elbow flexion      Elbow extension      Wrist flexion      Wrist extension      Wrist ulnar deviation      Wrist radial deviation      Wrist pronation      Wrist supination      Grip strength (lbs) 35 37  (Blank rows = not tested)   SHOULDER SPECIAL TESTS: Deferred based on diagnosis   JOINT MOBILITY TESTING:  deferred             TODAY'S TREATMENT:         [facility] Adult PT Treatment:                                                DATE: 11/14/22 Therapeutic Exercise: Pulley shoulder flex x 3 min  Row 2x10 RTB Inclined L shoulder flex x 10 1# Inclined dow flexion 2x10 Inclined chest press 2x10 Shoulder ext 2x10 GTB Seated bilateral ER x 10 YTB Seated horizontal abd x 10 YTB  [facility] Adult PT Treatment:                                                DATE: 11/08/22 Therapeutic Exercise: OH pulleys 4 min Supine L flexion 10x inclined Supine abduction 10x inclined LUE row YTB 10x Seated L abduction elbow bent 10x Manual Therapy: Rhythmic stabilization 90d flexion 4 way scapular mobility 10x  DATE: 10/23/21 Eval and HEP     PATIENT EDUCATION: Education details: Discussed eval findings, rehab rationale and POC and patient  is in agreement  Person educated: Patient Education method: Explanation Education comprehension: verbalized understanding and needs further education   HOME EXERCISE PROGRAM: Access Code: [email or web address] URL: [email or web address] Date: 10/24/2022 Prepared by: Gustavus Bryant   Exercises - Supine Shoulder Flexion with Dowel  - 3 x daily - 5 x weekly - 1 sets - 10 reps - Supine Shoulder Protraction with Dowel  - 3 x daily - 5 x weekly - 1 sets - 10 reps - Scapular Wall Slides  - 3 x daily - 5 x weekly - 1 sets - 10 reps   ASSESSMENT:   CLINICAL IMPRESSION: Pt was able to complete all prescribed exercises with no adverse effect. Therapy focused on dynamic stability of L shoulder. Pt progressing as expected with therapy, will continue per POC.    OBJECTIVE IMPAIRMENTS: decreased activity tolerance, decreased endurance, decreased knowledge of condition, decreased mobility, decreased ROM, decreased strength, impaired UE functional use, postural dysfunction, and pain.    ACTIVITY LIMITATIONS: carrying and lifting   PERSONAL FACTORS: Age, Education, Fitness, and Time since onset of injury/illness/exacerbation are also affecting patient's functional outcome.      GOALS: Goals reviewed with patient? No   SHORT TERM GOALS: Target date: 11/14/2022   Patient to demonstrate independence in HEP  Baseline: 8DPHWQV4 Goal status: INITIAL   2.  Decrease worst pain to 8/10 Baseline: 10/10 Goal status: INITIAL   3.  Increase AROM of abduction and flexion to 90d Baseline:  A/PROM Right eval Left eval  Shoulder flexion 135 70/120d  Shoulder extension   45d  Shoulder abduction 135 60/90d    Goal status: INITIAL       LONG TERM GOALS: Target date: 12/05/2022   6/10 worst pain Baseline: 10/10 worst pain Goal status: INITIAL   2.  Increase FOTO score to 54 Baseline: 47 Goal status: INITIAL   3.  Increase L shoulder flexion to 135d Baseline:  A/PROM Right eval Left eval   Shoulder flexion 135 70/120d  Shoulder extension   45d  Shoulder abduction 135 60/90d    Goal status: INITIAL   4.  Increase L shoulder strength to 4/5 in deficient areas Baseline:  MMT Right eval Left eval  Shoulder flexion 4 3+  Shoulder extension 4 4  Shoulder abduction 4 4-  Shoulder adduction      Shoulder internal rotation 4 4  Shoulder external rotation 4 4-    Goal status: INITIAL       PLAN:   PT FREQUENCY: 2x/week   PT DURATION: 6 weeks   PLANNED INTERVENTIONS: Therapeutic exercises, Therapeutic activity, Neuromuscular re-education, Balance training, Gait training, Patient/Family education, Self Care, Joint mobilization, Dry Needling, Manual therapy, and Re-evaluation   PLAN FOR NEXT SESSION: HEP review and update, manual techniques as appropriate, aerobic tasks, ROM and flexibility activities, strengthening and PREs, TPDN, gait and balance training as needed     Hildred Laser, PT 11/23/2022, 9:47 AM

## 2022-11-27 ENCOUNTER — Ambulatory Visit (INDEPENDENT_AMBULATORY_CARE_PROVIDER_SITE_OTHER): Payer: 59 | Admitting: Dermatology

## 2022-11-27 ENCOUNTER — Encounter: Payer: Self-pay | Admitting: Dermatology

## 2022-11-27 VITALS — BP 137/92

## 2022-11-27 DIAGNOSIS — D119 Benign neoplasm of major salivary gland, unspecified: Secondary | ICD-10-CM

## 2022-11-27 NOTE — Patient Instructions (Addendum)
Scarlette Ar, MD  Facial Plastic & Reconstructive Surgery Otolaryngology - Head and Neck Surgery [facility]  Address: [address] [place], [place], [place] [address] Phone: [phone]     Due to recent changes in healthcare laws, you may see results of your pathology and/or laboratory studies on MyChart before the doctors have had a chance to review them. We understand that in some cases there may be results that are confusing or concerning to you. Please understand that not all results are received at the same time and often the doctors may need to interpret multiple results in order to provide you with the best plan of care or course of treatment. Therefore, we ask that you please give [place] 2 business days to thoroughly review all your results before contacting the office for clarification. Should we see a critical lab result, you will be contacted sooner.   If You Need Anything After Your Visit  If you have any questions or concerns for your doctor, please call our main line at [phone] If no one answers, please leave a voicemail as directed and we will return your call as soon as possible. Messages left after 4 pm will be answered the following business day.   You may also send [place] a message via MyChart. We typically respond to MyChart messages within 1-2 business days.  For prescription refills, please ask your pharmacy to contact our office. Our fax number is [phone].  If you have an urgent issue when the clinic is closed that cannot wait until the next business day, you can page your doctor at the number below.    Please note that while we do our best to be available for urgent issues outside of office hours, we are not available 24/7.   If you have an urgent issue and are unable to reach [place], you may choose to seek medical care at your doctor's office, retail clinic, urgent care center, or emergency room.  If  you have a medical emergency, please immediately call 911 or go to the emergency department. In the event of inclement weather, please call our main line at [phone] for an update on the status of any delays or closures.  Dermatology Medication Tips: Please keep the boxes that topical medications come in in order to help keep track of the instructions about where and how to use these. Pharmacies typically print the medication instructions only on the boxes and not directly on the medication tubes.   If your medication is too expensive, please contact our office at [phone] or send [place] a message through MyChart.   We are unable to tell what your co-pay for medications will be in advance as this is different depending on your insurance coverage. However, we may be able to find a substitute medication at lower cost or fill out paperwork to get insurance to cover a needed medication.   If a prior authorization is required to get your medication covered by your insurance company, please allow [place] 1-2 business days to complete this process.  Drug prices often vary depending on where the prescription is filled and some pharmacies may offer cheaper prices.  The website www.goodrx.com contains coupons for medications through different pharmacies. The prices here do not account for what the cost may be with help from insurance (it may be cheaper with your insurance), but the website can give you the price if you did not use any  insurance.  - You can print the associated coupon and take it with your prescription to the pharmacy.  - You may also stop by our office during regular business hours and pick up a GoodRx coupon card.  - If you need your prescription sent electronically to a different pharmacy, notify our office through [facility] or by phone at [phone]

## 2022-11-27 NOTE — Progress Notes (Signed)
   New Patient Visit   Subjective  Monica Watson is a 67 y.o. female who presents for the following: A second opinion of a lump on the left neck x 1 year. 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 She states the general surgeon discussed a significant scar if removed. She says it is a 7 on pain scale. She says sometimes it feels hard and then it gets softer. She is complaining of diminished hearing in the right ear.   The following portions of the chart were reviewed this encounter and updated as appropriate: medications, allergies, medical history  Review of Systems:  No other skin or systemic complaints except as noted in HPI or Assessment and Plan.  Objective  Well appearing patient in no apparent distress; mood and affect are within normal limits.    A focused examination was performed of the following areas: Left neck  Relevant exam findings are noted in the Assessment and Plan.    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 Explained to her this is a procedure that will be done by a Head and Neck surgeon. She is not entirely sure if a referral was made.  She was seen by Dr. Daine Gip whom discussed a Parotidectomy. At the time she was unsure if she wanted to have the procedure done. After today's visit and consideration of it becoming painful she has changed her mind and wants to have it removed. She was advised to return to see Dr. Daine Gip to discuss the Parotidectomy. Contact info provided.   Scarlette Ar, MD  Facial Plastic & Reconstructive Surgery Otolaryngology - Head and Neck Surgery [facility]  Address: [address] [place], [place], [place] [address] Phone: [phone]  No follow-ups on  file.  Jaclynn Guarneri, CMA, am acting as scribe for [employer], DO.   Documentation: I have reviewed the above documentation for accuracy and completeness, and I agree with the above.  Langston Reusing, DO

## 2022-11-28 ENCOUNTER — Ambulatory Visit: Payer: 59

## 2022-11-30 ENCOUNTER — Ambulatory Visit: Payer: 59

## 2022-12-06 ENCOUNTER — Other Ambulatory Visit (INDEPENDENT_AMBULATORY_CARE_PROVIDER_SITE_OTHER): Payer: 59

## 2022-12-06 ENCOUNTER — Other Ambulatory Visit: Payer: Self-pay

## 2022-12-06 ENCOUNTER — Ambulatory Visit (INDEPENDENT_AMBULATORY_CARE_PROVIDER_SITE_OTHER): Payer: 59 | Admitting: Orthopaedic Surgery

## 2022-12-06 DIAGNOSIS — M25552 Pain in left hip: Secondary | ICD-10-CM

## 2022-12-06 DIAGNOSIS — M79672 Pain in left foot: Secondary | ICD-10-CM | POA: Diagnosis not present

## 2022-12-06 NOTE — Progress Notes (Signed)
Office Visit Note   Patient: Monica Watson           Date of Birth: 1956/06/06           MRN: [id number] Visit Date: 12/06/2022              Requested by: Sabino Dick, DO [address] [place],  [place] [address] PCP: Sabino Dick, DO   Assessment & Plan: Visit Diagnoses:  1. Pain in left hip   2. Left foot pain     Plan: Impression is chronic left leg pain.  I believe it is coming from her hip joint but her symptoms did not present classically like this so it could be coming from her back.  I would like to try a left hip injection to see how she responds to this.  If she gets no relief from this then I recommended that she follow-up with her chronic pain management clinic for further evaluation and treatment for back.  Follow-Up Instructions: No follow-ups on file.   Orders:  Orders Placed This Encounter  Procedures   XR HIP UNILAT W OR W/O PELVIS 2-3 VIEWS LEFT   No orders of the defined types were placed in this encounter.     Procedures: No procedures performed   Clinical Data: No additional findings.   Subjective: Chief Complaint  Patient presents with   Left Leg - Pain    HPI Monica Watson is a 67 year old female who is here for evaluation of left leg and pain.  She reports groin pain but also pain that radiates down the lateral side of her leg down to her foot.  She is on oxycodone for chronic pain.  Takes gabapentin as well.  The pain interferes with sleeping.  Denies any back pain. Review of Systems  Constitutional: Negative.   HENT: Negative.    Eyes: Negative.   Respiratory: Negative.    Cardiovascular: Negative.   Endocrine: Negative.   Musculoskeletal: Negative.   Neurological: Negative.   Hematological: Negative.   Psychiatric/Behavioral: Negative.    All other systems reviewed and are negative.    Objective: Vital Signs: There were no vitals taken for this visit.  Physical Exam Vitals and nursing note reviewed.   Constitutional:      Appearance: She is well-developed.  HENT:     Head: Atraumatic.     Nose: Nose normal.  Eyes:     Extraocular Movements: Extraocular movements intact.  Cardiovascular:     Pulses: Normal pulses.  Pulmonary:     Effort: Pulmonary effort is normal.  Abdominal:     Palpations: Abdomen is soft.  Musculoskeletal:     Cervical back: Neck supple.  Skin:    General: Skin is warm.     Capillary Refill: Capillary refill takes less than 2 seconds.  Neurological:     Mental Status: She is alert. Mental status is at baseline.  Psychiatric:        Behavior: Behavior normal.        Thought Content: Thought content normal.        Judgment: Judgment normal.     Ortho Exam Examination of the left hip shows pain with range of motion of the hip joint but the pain is referred to the back of the thigh.  She has no trochanteric tenderness.  Stinchfield's test produces pain to the posterior thigh. Specialty Comments:  No specialty comments available.  Imaging: No results found.   PMFS History: Patient Active Problem List  Diagnosis Date Noted   Rotator cuff arthropathy of left shoulder 04/23/2022   Type 2 diabetes mellitus without complication, without long-term current use of insulin (HCC) 03/20/2022   Neck mass 03/20/2022   Osteoarthritis 02/19/2022   Depressed mood 02/19/2022   Abnormal uterine bleeding (AUB) 02/08/2022   Internal hemorrhoids 07/28/2021   Viral URI with cough 06/29/2021   Cervical lymphadenopathy 03/11/2021   Status post revision of total replacement of right knee 12/16/2020   Polyethylene wear of right knee prosthesis, 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/15/2020   Sialadenitis 11/23/2020   Abdominal pain 10/23/2020   Cervical cancer screening 08/10/2020   Bartholin cyst 07/29/2020   History of total knee replacement, left 06/13/2020   History of total knee arthroplasty, right 06/13/2020   Acute right-sided back pain 06/09/2020   Depression, major,  single episode, mild (HCC) 05/12/2020   Vision changes 12/31/2019   Bilateral knee pain 10/08/2019   Poor dentition 10/08/2019   Healthcare maintenance 10/08/2019   Chronic left shoulder pain 08/31/2019   Hemorrhoids 04/04/2019   History of stroke 01/01/2019   Hypertension associated with diabetes (HCC) 01/01/2019   Pre-diabetes 01/01/2019   Hot flashes 01/01/2019   Onychomycosis 01/01/2019   Past Medical History:  Diagnosis Date   Arthritis    Cerebrovascular disease    Depression    Headache    Hypertension    Stroke (HCC) 2010   Type 2 diabetes mellitus (HCC)     Family History  Problem Relation Age of Onset   Colon cancer Neg Hx    Esophageal cancer Neg Hx    Stomach cancer Neg Hx     Past Surgical History:  Procedure Laterality Date   bilateral knee replacements      CATARACT EXTRACTION, BILATERAL     FRACTURE SURGERY Right    arm as a child   gallstones removed     I & D KNEE WITH POLY EXCHANGE Right 12/16/2020   Procedure: POLY EXCHANGE RIGHT KNEE;  Surgeon: Kathryne Hitch, MD;  Location: [facility];  Service: Orthopedics;  Laterality: Right;   KNEE ARTHROSCOPY Bilateral    left carpal tunnel release      REVERSE SHOULDER ARTHROPLASTY Left 04/23/2022   Procedure: LEFT REVERSE SHOULDER ARTHROPLASTY;  Surgeon: Huel Cote, MD;  Location: [facility];  Service: Orthopedics;  Laterality: Left;   SHOULDER ARTHROSCOPY Bilateral    THYROIDECTOMY     Social History   Occupational History   Occupation: disability  Tobacco Use   Smoking status: Every Day    Packs/day: 0.50    Years: 46.00    Additional pack years: 0.00    Total pack years: 23.00    Types: Cigarettes    Passive exposure: Current   Smokeless tobacco: Never  Vaping Use   Vaping Use: Never used  Substance and Sexual Activity   Alcohol use: Never   Drug use: Never   Sexual activity: Not on file

## 2022-12-07 DIAGNOSIS — Z8673 Personal history of transient ischemic attack (TIA), and cerebral infarction without residual deficits: Secondary | ICD-10-CM | POA: Diagnosis not present

## 2022-12-07 DIAGNOSIS — G252 Other specified forms of tremor: Secondary | ICD-10-CM | POA: Diagnosis not present

## 2022-12-07 DIAGNOSIS — I639 Cerebral infarction, unspecified: Secondary | ICD-10-CM | POA: Diagnosis not present

## 2022-12-14 ENCOUNTER — Encounter: Payer: Self-pay | Admitting: Sports Medicine

## 2022-12-14 ENCOUNTER — Other Ambulatory Visit: Payer: Self-pay

## 2022-12-14 ENCOUNTER — Ambulatory Visit (INDEPENDENT_AMBULATORY_CARE_PROVIDER_SITE_OTHER): Payer: 59 | Admitting: Sports Medicine

## 2022-12-14 ENCOUNTER — Ambulatory Visit: Payer: 59

## 2022-12-14 DIAGNOSIS — M1612 Unilateral primary osteoarthritis, left hip: Secondary | ICD-10-CM | POA: Diagnosis not present

## 2022-12-14 DIAGNOSIS — M25552 Pain in left hip: Secondary | ICD-10-CM | POA: Diagnosis not present

## 2022-12-14 MED ORDER — METHYLPREDNISOLONE ACETATE 40 MG/ML IJ SUSP
80.0000 mg | INTRAMUSCULAR | Status: AC | PRN
Start: 2022-12-14 — End: 2022-12-14
  Administered 2022-12-14: 80 mg via INTRA_ARTICULAR

## 2022-12-14 MED ORDER — LIDOCAINE HCL 1 % IJ SOLN
4.0000 mL | INTRAMUSCULAR | Status: AC | PRN
Start: 2022-12-14 — End: 2022-12-14
  Administered 2022-12-14: 4 mL

## 2022-12-14 NOTE — Progress Notes (Signed)
   Procedure Note  Patient: Monica Watson             Date of Birth: [birth year]           MRN: [id number]             Visit Date: 12/14/2022  Procedures: Visit Diagnoses:  1. Pain in left hip   2. Unilateral primary osteoarthritis, left hip    Large Joint Inj: L hip joint on 12/14/2022 4:04 PM Indications: pain Details: 22 G 3.5 in needle, ultrasound-guided anterior approach Medications: 4 mL lidocaine 1 %; 80 mg methylPREDNISolone acetate 40 MG/ML Outcome: tolerated well, no immediate complications  Procedure: US-guided intra-articular hip injection, left After discussion on risks/benefits/indications and informed verbal consent was obtained, a timeout was performed. Patient was lying supine on exam table. The hip was cleaned with betadine and alcohol swabs. Then utilizing ultrasound guidance, the patient's femoral head and neck junction was identified and subsequently injected with 4:2 lidocaine:depomedrol via an in-plane approach with ultrasound visualization of the injectate administered into the hip joint. Patient tolerated procedure well without immediate complications.  Procedure, treatment alternatives, risks and benefits explained, specific risks discussed. Consent was given by the patient. Immediately prior to procedure a time out was called to verify the correct patient, procedure, equipment, support staff and site/side marked as required. Patient was prepped and draped in the usual sterile fashion.     - I evaluated the patient about 5 minutes post-injection and she had some improvement in pain and range of motion - follow-up with Dr. Roda Shutters as indicated -discussed with Terilynn to see the improvement in her pain over the next 1 to 2 weeks and let Dr. Roda Shutters know the degree of improvement  Madelyn Brunner, DO Primary Care Sports Medicine Physician  [facility]  This note was dictated using Dragon naturally speaking software and may contain errors in syntax,  spelling, or content which have not been identified prior to signing this note.

## 2022-12-20 DIAGNOSIS — Z79899 Other long term (current) drug therapy: Secondary | ICD-10-CM | POA: Diagnosis not present

## 2022-12-20 DIAGNOSIS — M5416 Radiculopathy, lumbar region: Secondary | ICD-10-CM | POA: Diagnosis not present

## 2023-01-08 NOTE — Progress Notes (Signed)
    SUBJECTIVE:   CHIEF COMPLAINT / HPI:   Diabetes Current Regimen: Metformin 500mg  daily CBGs: Not checking at home Last A1c:  Lab Results  Component Value Date   HGBA1C 6.2 10/08/2022    Denies polyuria, polydipsia, hypoglycemia Last Eye Exam: Past eye doctor retired, needs referral to see another one Statin: Atorvastatin 40mg  ACE/ARB: Losartan 50mg    Hypertension: - Medications: Losartan 50mg  - Compliance: No - has not taken BP med x 2 weeks - Checking BP at home: No - Denies any SOB, CP, vision changes, LE edema, medication SEs, or symptoms of hypotension  L hip pain/low back pain: Chronic issue. Seen by Orthopedics on 12/14/22 and had hip injection but relief only lasted 4 days. Orthopedics feel it is likely related to her back issues. Sees Pain management, about to establish with new doctor at [facility] in [facility] in 6 days. Taking Gabapentin, Percocet and Tizanidine for relief but still has issues ambulating.  Smoking cessation Reports she has cut back to 4-5 cigarettes/day. Motivated to quit. Not currently using any nicotine replacement or other medications to help.  PERTINENT  PMH / PSH: HTN, T2DM, h/o CVA, depression, chronic back pain  OBJECTIVE:   BP (!) 154/95   Pulse 65   Ht 5\' 5"  (1.651 m)   Wt 188 lb 12.8 oz (85.6 kg)   SpO2 99%   BMI 31.42 kg/m    General: Alert, no apparent distress, well groomed HEENT: Normocephalic, atraumatic, moist mucus membranes, neck supple Respiratory: Normal respiratory effort GI: Non-distended Skin: No rashes MSK: Positive straight leg raise on L side. Mild weakness in hip flexion on L compared to right. Sensation intact bilaterally. TTP from L lumbar to posterior L heel. Psych: Appropriate mood and affect   ASSESSMENT/PLAN:   Type 2 diabetes mellitus without complication, without long-term current use of insulin (HCC) A1c 6.2 in 09/2022. Well controlled on Metformin 500mg  daily. -A1c in  03/2023  Hypertension associated with diabetes (HCC) 154/95, uncontrolled and has been off BP med x 2 weeks. Asymptomatic -Restart Losartan 50mg  and f/u in 2 weeks for BP check and BMP  History of stroke Currently on ASA 325mg , started by Orthopedics after shoulder arthroscopy in 03/2022. H/o GI bleed remotely, was taken off ASA for secondary prevention at that time. -Switch to ASA 81mg  for secondary CVA prevention  Tobacco use Down to 4-5 cigarettes/day. Motivated to quit. Declines interest in cessation assistance. 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 Surgeon faxing over pre-op paperwork.  Chronic back pain Chronic low back and L hip pain with radiculopathy down L leg to heel. Hip injection 6/21, did not provide relief, likely secondary to lumbar spine issue. Follow with [facility] in [facility], appointment scheduled in th next week. Records not available. Taking Percocet 10-325 q6h prn, Gabpentin 800mg  at bedtime and Tizanidine 4mg  q8h prn. -Referred to PT -Discussed concern from oversedation given multiple pain modalities. Patient to discuss further with PM&R this week. Patient would benefit from lumber imaging, will obtain ROI at follow up.     Dr. Elberta Fortis, DO [facility]

## 2023-01-09 ENCOUNTER — Encounter: Payer: Self-pay | Admitting: Family Medicine

## 2023-01-09 ENCOUNTER — Ambulatory Visit (INDEPENDENT_AMBULATORY_CARE_PROVIDER_SITE_OTHER): Payer: 59 | Admitting: Family Medicine

## 2023-01-09 ENCOUNTER — Telehealth: Payer: Self-pay

## 2023-01-09 ENCOUNTER — Other Ambulatory Visit: Payer: Self-pay

## 2023-01-09 VITALS — BP 154/95 | HR 65 | Ht 65.0 in | Wt 188.8 lb

## 2023-01-09 DIAGNOSIS — D119 Benign neoplasm of major salivary gland, unspecified: Secondary | ICD-10-CM

## 2023-01-09 DIAGNOSIS — Z23 Encounter for immunization: Secondary | ICD-10-CM

## 2023-01-09 DIAGNOSIS — Z8673 Personal history of transient ischemic attack (TIA), and cerebral infarction without residual deficits: Secondary | ICD-10-CM | POA: Diagnosis not present

## 2023-01-09 DIAGNOSIS — M5442 Lumbago with sciatica, left side: Secondary | ICD-10-CM

## 2023-01-09 DIAGNOSIS — E1159 Type 2 diabetes mellitus with other circulatory complications: Secondary | ICD-10-CM

## 2023-01-09 DIAGNOSIS — Z720 Tobacco use: Secondary | ICD-10-CM

## 2023-01-09 DIAGNOSIS — E119 Type 2 diabetes mellitus without complications: Secondary | ICD-10-CM

## 2023-01-09 DIAGNOSIS — G8929 Other chronic pain: Secondary | ICD-10-CM

## 2023-01-09 DIAGNOSIS — I152 Hypertension secondary to endocrine disorders: Secondary | ICD-10-CM

## 2023-01-09 MED ORDER — LOSARTAN POTASSIUM 50 MG PO TABS
50.0000 mg | ORAL_TABLET | Freq: Every day | ORAL | 3 refills | Status: DC
Start: 2023-01-09 — End: 2023-10-31

## 2023-01-09 MED ORDER — ASPIRIN 81 MG PO TBEC
81.0000 mg | DELAYED_RELEASE_TABLET | Freq: Every day | ORAL | 1 refills | Status: DC
Start: 2023-01-09 — End: 2023-10-01

## 2023-01-09 NOTE — Patient Instructions (Addendum)
It was wonderful to see you today! Thank you for choosing [facility].   Please bring ALL of your medications with you to every visit.   Today we talked about:  I referred you to physical therapy for your back pain. Our office will follow up about this referral. Please keep your appointment with [facility] for further pain management and continue taking your medications prescribed by them I refilled your blood pressure medication. Please take every day and come back and see me in 2 weeks for blood pressure check and labs. I referred you to an eye doctor for exam. You were switched to baby aspirin to take every day due to your history of stroke.  Please follow up in 2 weeks.  Call the clinic at [phone] if your symptoms worsen or you have any concerns.  Please be sure to schedule follow up at the front desk before you leave today.   Elberta Fortis, DO Family Medicine

## 2023-01-09 NOTE — Telephone Encounter (Signed)
Dr. Erskine Speed ENT office calls nurse line requesting medical clearance.   She reports the patient has an upcoming surgery on 8/28 and is on blood thinners.   She reports she will be faxing over clearance form to our office for PCP to sign off on.

## 2023-01-10 DIAGNOSIS — Z720 Tobacco use: Secondary | ICD-10-CM | POA: Insufficient documentation

## 2023-01-10 NOTE — Assessment & Plan Note (Signed)
Down to 4-5 cigarettes/day. Motivated to quit. Declines interest in cessation assistance. -Goal of reducing to 4 cigarettes max but next appointment -Prevnar-20 today

## 2023-01-10 NOTE — Assessment & Plan Note (Signed)
Currently on ASA 325mg , started by Orthopedics after shoulder arthroscopy in 03/2022. H/o GI bleed remotely, was taken off ASA for secondary prevention at that time. -Switch to ASA 81mg  for secondary CVA prevention

## 2023-01-10 NOTE — Assessment & Plan Note (Addendum)
Planning for parotidectomy with ENT on 02/20/2023. Surgeon faxing over pre-op paperwork.

## 2023-01-10 NOTE — Assessment & Plan Note (Signed)
Chronic low back and L hip pain with radiculopathy down L leg to heel. Hip injection 6/21, did not provide relief, likely secondary to lumbar spine issue. Follow with [facility], appointment scheduled in th next week. Records not available. Taking Percocet 10-325 q6h prn, Gabpentin 800mg  at bedtime and Tizanidine 4mg  q8h prn. -Referred to PT -Discussed concern from oversedation given multiple pain modalities. Patient to discuss further with PM&R this week. Patient would benefit from lumber imaging, will obtain ROI at follow up.

## 2023-01-10 NOTE — Assessment & Plan Note (Signed)
154/95, uncontrolled and has been off BP med x 2 weeks. Asymptomatic -Restart Losartan 50mg  and f/u in 2 weeks for BP check and BMP

## 2023-01-10 NOTE — Assessment & Plan Note (Signed)
A1c 6.2 in 09/2022. Well controlled on Metformin 500mg  daily. -A1c in 03/2023

## 2023-01-24 ENCOUNTER — Other Ambulatory Visit: Payer: Self-pay

## 2023-01-24 ENCOUNTER — Ambulatory Visit (INDEPENDENT_AMBULATORY_CARE_PROVIDER_SITE_OTHER): Payer: 59 | Admitting: Family Medicine

## 2023-01-24 ENCOUNTER — Encounter: Payer: Self-pay | Admitting: Family Medicine

## 2023-01-24 VITALS — BP 121/87 | HR 76 | Ht 65.0 in | Wt 188.0 lb

## 2023-01-24 DIAGNOSIS — Z720 Tobacco use: Secondary | ICD-10-CM | POA: Diagnosis not present

## 2023-01-24 DIAGNOSIS — I152 Hypertension secondary to endocrine disorders: Secondary | ICD-10-CM

## 2023-01-24 DIAGNOSIS — D119 Benign neoplasm of major salivary gland, unspecified: Secondary | ICD-10-CM

## 2023-01-24 DIAGNOSIS — E1159 Type 2 diabetes mellitus with other circulatory complications: Secondary | ICD-10-CM

## 2023-01-24 DIAGNOSIS — I10 Essential (primary) hypertension: Secondary | ICD-10-CM

## 2023-01-24 DIAGNOSIS — E119 Type 2 diabetes mellitus without complications: Secondary | ICD-10-CM

## 2023-01-24 DIAGNOSIS — G8929 Other chronic pain: Secondary | ICD-10-CM

## 2023-01-24 DIAGNOSIS — M5442 Lumbago with sciatica, left side: Secondary | ICD-10-CM

## 2023-01-24 LAB — POCT GLYCOSYLATED HEMOGLOBIN (HGB A1C): HbA1c, POC (controlled diabetic range): 6.2 % (ref 0.0–7.0)

## 2023-01-24 NOTE — Progress Notes (Signed)
    SUBJECTIVE:   CHIEF COMPLAINT / HPI:   Hypertension: - Medications: Losartan 50mg   - Compliance: Yes - Checking BP at home: No - Denies any SOB, CP, vision changes, LE edema, medication SEs, or symptoms of hypotension  Tobacco use Reports she is smoking 4 to 5 cigarettes/day. Consistent with use at prior visit. Since additional support with smoking cessation, states she will continue to wean slowly  Hip/low back pain Hip x-ray consistent with moderate to severe OA.  Patient reports continued pain and difficulty ambulating. Seeing pain management on 8/23 to discuss options.  States she has had multiple surgeries including both of her knees replaced and would like to avoid another surgery.  Continues to take Percocet 10 q6h prn, Tizanidine 4mg  q8h prn and gabapentin 800 mg at bedtime.  Diabetes Current Regimen: Metformin 500mg  daily CBGs: Not checking at home Last A1c:  Lab Results  Component Value Date   HGBA1C 6.2 01/24/2023    Denies polyuria, polydipsia, hypoglycemia. Last Eye Exam: Referred at last visit - pending Statin: Atorvastatin 40mg  ACE/ARB: Losartan 50mg   PERTINENT  PMH / PSH: HTN, T2DM, h/o CVA, depression, chronic back pain   OBJECTIVE:   BP 121/87   Pulse 76   Ht 5\' 5"  (1.651 m)   Wt 188 lb (85.3 kg)   SpO2 99%   BMI 31.28 kg/m    General: Alert, no apparent distress, well groomed HEENT: Normocephalic, atraumatic, moist mucus membranes, neck supple Respiratory: Normal respiratory effort GI: Non-distended Skin: No rashes, no jaundice Psych: Appropriate mood and affect  ASSESSMENT/PLAN:   Type 2 diabetes mellitus without complication, without long-term current use of insulin (HCC) A1c 6.2. Well-controlled on metformin 500 mg daily.  Referred to ophthalmology at last visit, referral pending. -A1c in 6 months  Hypertension associated with diabetes (HCC) 121/87, well-controlled. Compliant on losartan 50 mg daily.  Continue current therapy. -BMP  at next visit  Tobacco use Smoking 4-5 cigarettes/day currently. Continues to decline cessation support.  Encouraged patient to set goal to reduce to 2-3 cigarettes/day by next visit  Warthin's tumor Completed pre-op paperwork for parotidectomy and fax back to ENT office. Patient does not need to hold aspirin unless desired by surgeon.  Chronic back pain Pain continues to be suboptimally controlled. Taking Percocet 10-325 q6h prn, Gabpentin 800mg  at bedtime and Tizanidine 4mg  q8h prn. L Hip XR on 11/2022 showed moderate to severe OA. Planning to see pain management on 8/23, states she may have back imaging coming up with them.  Pain most likely secondary to chronic OA. -Provided referral information to schedule PT -Patient unable to remember information for pain management clinic to obtain ROI. Will reattempt at next visit   Dr. Elberta Fortis, DO [facility]

## 2023-01-24 NOTE — Patient Instructions (Addendum)
It was wonderful to see you today! Thank you for choosing [facility].   Please bring ALL of your medications with you to every visit.   Today we talked about:  Please contact the physical therapy office to schedule an appointment. Follow up with your Pain Management doctor to discuss additional options for your back and hip pain.  [facility] at [facility] [address] [place], [place], [place] [address] Phone: [phone]  2. Your blood pressure looks good today! Please continue to take the Losartan every day it home 3. Continue to work on stopping smoking! You are doing well and our goal can be for you to be down to 2-3 cigarettes per day by the time I see you in October.  Please follow up in 2 months to discuss diabetes  Call the clinic at [phone] if your symptoms worsen or you have any concerns.  Please be sure to schedule follow up at the front desk before you leave today.   Elberta Fortis, DO Family Medicine

## 2023-01-25 NOTE — Therapy (Signed)
OUTPATIENT PHYSICAL THERAPY THORACOLUMBAR EVALUATION   Patient Name: Monica Watson MRN: [id number] DOB:04-07-1956, 67 y.o., female Today's Date: 01/25/2023  END OF SESSION:   Past Medical History:  Diagnosis Date   Arthritis    Cerebrovascular disease    Depression    Headache    Hypertension    Stroke [facility]) 2010   Type 2 diabetes mellitus (HCC)    Past Surgical History:  Procedure Laterality Date   bilateral knee replacements      CATARACT EXTRACTION, BILATERAL     FRACTURE SURGERY Right    arm as a child   gallstones removed     I & D KNEE WITH POLY EXCHANGE Right 12/16/2020   Procedure: POLY EXCHANGE RIGHT KNEE;  Surgeon: Kathryne Hitch, MD;  Location: [facility];  Service: Orthopedics;  Laterality: Right;   KNEE ARTHROSCOPY Bilateral    left carpal tunnel release      REVERSE SHOULDER ARTHROPLASTY Left 04/23/2022   Procedure: LEFT REVERSE SHOULDER ARTHROPLASTY;  Surgeon: Huel Cote, MD;  Location: [facility];  Service: Orthopedics;  Laterality: Left;   SHOULDER ARTHROSCOPY Bilateral    THYROIDECTOMY     Patient Active Problem List   Diagnosis Date Noted   Tobacco use 01/10/2023   Rotator cuff arthropathy of left shoulder 04/23/2022   Type 2 diabetes mellitus without complication, without long-term current use of insulin (HCC) 03/20/2022   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   Osteoarthritis 02/19/2022   Abnormal uterine bleeding (AUB) 02/08/2022   Internal hemorrhoids 07/28/2021   Cervical lymphadenopathy 03/11/2021   Polyethylene wear of right knee prosthesis, 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/15/2020   Sialadenitis 11/23/2020   Abdominal pain 10/23/2020   Bartholin cyst 07/29/2020   History of total knee replacement, left 06/13/2020   Status post revision of total replacement of right knee 06/13/2020   Chronic back pain 06/09/2020   Depression, major, single episode, mild (HCC) 05/12/2020   Vision changes 12/31/2019   Bilateral knee pain 10/08/2019   Poor dentition  10/08/2019   Healthcare maintenance 10/08/2019   Chronic left shoulder pain 08/31/2019   Hemorrhoids 04/04/2019   History of stroke 01/01/2019   Hypertension associated with diabetes (HCC) 01/01/2019   Pre-diabetes 01/01/2019   Hot flashes 01/01/2019   Onychomycosis 01/01/2019    PCP: Elberta Fortis, MD  REFERRING PROVIDER: Doreene Eland, MD  REFERRING DIAG: Chronic left-sided low back pain with left-sided sciatica  Rationale for Evaluation and Treatment: Rehabilitation  THERAPY DIAG:  No diagnosis found.  ONSET DATE: ***   SUBJECTIVE:  SUBJECTIVE STATEMENT: ***  PERTINENT HISTORY:  ***  PAIN:  Are you having pain? Yes:  NPRS scale: ***/10 Pain location: *** Pain description: *** Aggravating factors: *** Relieving factors: ***  PRECAUTIONS: None  RED FLAGS: None   WEIGHT BEARING RESTRICTIONS: No  FALLS:  Has patient fallen in last 6 months? {fallsyesno:27318}  LIVING ENVIRONMENT: Lives with: {[facility] lives with:25569::"lives with their family"} Lives in: {Lives in:25570} Stairs: {opstairs:27293} Has following equipment at home: {Assistive devices:23999}  OCCUPATION: ***  PLOF: Independent  PATIENT GOALS: ***   OBJECTIVE:  PATIENT SURVEYS:  FOTO ***  COGNITION: Overall cognitive status: Within functional limits for tasks assessed     SENSATION: {sensation:27233}  MUSCLE LENGTH: ***  POSTURE:   ***  PALPATION: ***  LUMBAR ROM:   AROM eval  Flexion   Extension   Right lateral flexion   Left lateral flexion   Right rotation   Left rotation    (Blank rows = not tested)  LOWER EXTREMITY ROM:     {AROM/PROM:27142}  Right eval Left eval  Hip flexion    Hip extension    Hip abduction    Hip adduction    Hip internal rotation    Hip external  rotation    Knee flexion    Knee extension    Ankle dorsiflexion    Ankle plantarflexion    Ankle inversion    Ankle eversion     (Blank rows = not tested)  LOWER EXTREMITY MMT:    MMT Right eval Left eval  Hip flexion    Hip extension    Hip abduction    Hip adduction    Hip internal rotation    Hip external rotation    Knee flexion    Knee extension    Ankle dorsiflexion    Ankle plantarflexion    Ankle inversion    Ankle eversion     (Blank rows = not tested)  LUMBAR SPECIAL TESTS:  {lumbar special test:25242}  FUNCTIONAL TESTS:  {Functional tests:24029}  GAIT: Distance walked: *** Assistive device utilized: {Assistive devices:23999} Level of assistance: {Levels of assistance:24026} Comments: ***   TODAY'S TREATMENT:       [facility] Adult PT Treatment:                                                DATE: *** Therapeutic Exercise: ***  PATIENT EDUCATION:  Education details: Exam findings, POC, HEP Person educated: Patient Education method: Programmer, multimedia, Demonstration, Tactile cues, Verbal cues, and Handouts Education comprehension: verbalized understanding, returned demonstration, verbal cues required, tactile cues required, and needs further education  HOME EXERCISE PROGRAM: ***   ASSESSMENT: CLINICAL IMPRESSION: Patient is a 67 y.o. female who was seen today for physical therapy evaluation and treatment for ***.   OBJECTIVE IMPAIRMENTS: {opptimpairments:25111}.   ACTIVITY LIMITATIONS: {activitylimitations:27494}  PARTICIPATION LIMITATIONS: {participationrestrictions:25113}  PERSONAL FACTORS: {Personal factors:25162} are also affecting patient's functional outcome.   REHAB POTENTIAL: {rehabpotential:25112}  CLINICAL DECISION MAKING: {clinical decision making:25114}  EVALUATION COMPLEXITY: {Evaluation complexity:25115}   GOALS: Goals reviewed with patient? Yes  SHORT TERM GOALS: Target date: ***  Patient will be I with initial HEP in order  to progress with therapy. Baseline: HEP provided at eval Goal status: INITIAL  2.  *** Baseline:  Goal status: INITIAL  3.  *** Baseline:  Goal status: INITIAL  LONG TERM GOALS: Target date: ***  Patient will be I with final HEP to maintain progress from PT. Baseline: HEP provided at eval Goal status: INITIAL  2.  Patient will report >/= ***% status on FOTO to indicate improved functional ability. Baseline: % functional status Goal status: INITIAL  3.  *** Baseline:  Goal status: INITIAL  4.  *** Baseline:  Goal status: INITIAL   PLAN: PT FREQUENCY: {rehab frequency:25116}  PT DURATION: {rehab duration:25117}  PLANNED INTERVENTIONS: {rehab planned interventions:25118::"Therapeutic exercises","Therapeutic activity","Neuromuscular re-education","Balance training","Gait training","Patient/Family education","Self Care","Joint mobilization"}.  PLAN FOR NEXT SESSION: Review HEP and progress PRN, ***   Rosana Hoes, PT, DPT, LAT, ATC 01/25/23  8:59 AM Phone: [phone] Fax: [phone]

## 2023-01-25 NOTE — Assessment & Plan Note (Addendum)
121/87, well-controlled. Compliant on losartan 50 mg daily.  Continue current therapy. -BMP at next visit

## 2023-01-25 NOTE — Assessment & Plan Note (Addendum)
Pain continues to be suboptimally controlled. Taking Percocet 10-325 q6h prn, Gabpentin 800mg  at bedtime and Tizanidine 4mg  q8h prn. L Hip XR on 11/2022 showed moderate to severe OA. Planning to see pain management on 8/23, states she may have back imaging coming up with them.  Pain most likely secondary to chronic OA. -Provided referral information to schedule PT -Patient unable to remember information for pain management clinic to obtain ROI. Will reattempt at next visit

## 2023-01-25 NOTE — Assessment & Plan Note (Signed)
Smoking 4-5 cigarettes/day currently. Continues to decline cessation support.  Encouraged patient to set goal to reduce to 2-3 cigarettes/day by next visit

## 2023-01-25 NOTE — Assessment & Plan Note (Signed)
Completed pre-op paperwork for parotidectomy and fax back to ENT office. Patient does not need to hold aspirin unless desired by surgeon.

## 2023-01-25 NOTE — Assessment & Plan Note (Signed)
A1c 6.2. Well-controlled on metformin 500 mg daily.  Referred to ophthalmology at last visit, referral pending. -A1c in 6 months

## 2023-01-28 ENCOUNTER — Encounter: Payer: Self-pay | Admitting: Physical Therapy

## 2023-01-28 ENCOUNTER — Ambulatory Visit: Payer: 59 | Attending: Orthopaedic Surgery | Admitting: Physical Therapy

## 2023-01-28 ENCOUNTER — Other Ambulatory Visit: Payer: Self-pay

## 2023-01-28 DIAGNOSIS — M5459 Other low back pain: Secondary | ICD-10-CM | POA: Diagnosis present

## 2023-01-28 DIAGNOSIS — G8929 Other chronic pain: Secondary | ICD-10-CM | POA: Insufficient documentation

## 2023-01-28 DIAGNOSIS — M5442 Lumbago with sciatica, left side: Secondary | ICD-10-CM | POA: Diagnosis not present

## 2023-01-28 DIAGNOSIS — M6281 Muscle weakness (generalized): Secondary | ICD-10-CM

## 2023-01-28 NOTE — Patient Instructions (Signed)
Access Code: [email or web address] URL: [email or web address] Date: 01/28/2023 Prepared by: Rosana Hoes  Exercises - Supine Lower Trunk Rotation  - 1 x daily - 2 sets - 10 reps - Straight Leg Raise  - 1 x daily - 2 sets - 10 reps - Bridge  - 1 x daily - 2 sets - 10 reps - Hooklying Clamshell with Resistance  - 1 x daily - 2 sets - 10 reps

## 2023-02-06 ENCOUNTER — Other Ambulatory Visit: Payer: Self-pay

## 2023-02-06 ENCOUNTER — Ambulatory Visit: Payer: 59 | Admitting: Physical Therapy

## 2023-02-06 ENCOUNTER — Encounter: Payer: Self-pay | Admitting: Physical Therapy

## 2023-02-06 DIAGNOSIS — M5459 Other low back pain: Secondary | ICD-10-CM | POA: Diagnosis not present

## 2023-02-06 DIAGNOSIS — M6281 Muscle weakness (generalized): Secondary | ICD-10-CM

## 2023-02-06 NOTE — Patient Instructions (Signed)
Access Code: [email or web address] URL: [email or web address] Date: 02/06/2023 Prepared by: Rosana Hoes  Exercises - Supine Lower Trunk Rotation  - 1 x daily - 2 sets - 10 reps - Straight Leg Raise  - 1 x daily - 2 sets - 10 reps - Bridge  - 1 x daily - 2 sets - 10 reps - Hooklying Clamshell with Resistance  - 1 x daily - 2 sets - 10 reps - Sit to Stand  - 1 x daily - 2 sets - 10 reps

## 2023-02-06 NOTE — Therapy (Addendum)
OUTPATIENT PHYSICAL THERAPY TREATMENT  DISCHARGE   Patient Name: Monica Watson MRN: [id number] DOB:1956-05-13, 67 y.o., female Today's Date: 02/06/2023   END OF SESSION:  PT End of Session - 02/06/23 1404     Visit Number 2    Number of Visits 9    Date for PT Re-Evaluation 03/25/23    Authorization Type [employer] MCR / MCD    PT Start Time 1404    PT Stop Time 1443    PT Time Calculation (min) 39 min    Activity Tolerance Patient tolerated treatment well;Patient limited by pain    Behavior During Therapy WFL for tasks assessed/performed              Past Medical History:  Diagnosis Date   Arthritis    Cerebrovascular disease    Depression    Headache    Hypertension    Stroke (HCC) 2010   Type 2 diabetes mellitus (HCC)    Past Surgical History:  Procedure Laterality Date   bilateral knee replacements      CATARACT EXTRACTION, BILATERAL     FRACTURE SURGERY Right    arm as a child   gallstones removed     I & D KNEE WITH POLY EXCHANGE Right 12/16/2020   Procedure: POLY EXCHANGE RIGHT KNEE;  Surgeon: Kathryne Hitch, MD;  Location: [facility];  Service: Orthopedics;  Laterality: Right;   KNEE ARTHROSCOPY Bilateral    left carpal tunnel release      REVERSE SHOULDER ARTHROPLASTY Left 04/23/2022   Procedure: LEFT REVERSE SHOULDER ARTHROPLASTY;  Surgeon: Huel Cote, MD;  Location: [facility];  Service: Orthopedics;  Laterality: Left;   SHOULDER ARTHROSCOPY Bilateral    THYROIDECTOMY     Patient Active Problem List   Diagnosis Date Noted   Tobacco use 01/10/2023   Rotator cuff arthropathy of left shoulder 04/23/2022   Type 2 diabetes mellitus without complication, without long-term current use of insulin (HCC) 03/20/2022   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   Osteoarthritis 02/19/2022   Abnormal uterine bleeding (AUB) 02/08/2022   Internal hemorrhoids 07/28/2021   Cervical lymphadenopathy 03/11/2021   Polyethylene wear of right knee prosthesis, subsequent  encounter 12/15/2020   Sialadenitis 11/23/2020   Abdominal pain 10/23/2020   Bartholin cyst 07/29/2020   History of total knee replacement, left 06/13/2020   Status post revision of total replacement of right knee 06/13/2020   Chronic back pain 06/09/2020   Depression, major, single episode, mild (HCC) 05/12/2020   Vision changes 12/31/2019   Bilateral knee pain 10/08/2019   Poor dentition 10/08/2019   Healthcare maintenance 10/08/2019   Chronic left shoulder pain 08/31/2019   Hemorrhoids 04/04/2019   History of stroke 01/01/2019   Hypertension associated with diabetes (HCC) 01/01/2019   Pre-diabetes 01/01/2019   Hot flashes 01/01/2019   Onychomycosis 01/01/2019    PCP: Elberta Fortis, MD  REFERRING PROVIDER: Doreene Eland, MD  REFERRING DIAG: Chronic left-sided low back pain with left-sided sciatica  Rationale for Evaluation and Treatment: Rehabilitation  THERAPY DIAG:  Other low back pain  Muscle weakness (generalized)  ONSET DATE: Chronic   SUBJECTIVE:  SUBJECTIVE STATEMENT: Patient reports her left hip has been bothering her for the past few days, she has arthritis from her hip down to her heel. It bothers her on the side of the hip and thigh.   PAIN:  Are you having pain? Yes:  NPRS scale: 7/10 Pain location: Lower back, left hip and leg Pain description: Throbbing, popping Aggravating factors: Sitting, lying back, walking Relieving factors: Medication  PERTINENT HISTORY: Left rTSA 2023, bilateral knee replacement, CVA 2010 affecting left side  PRECAUTIONS: None  PATIENT GOALS: Pain relief   OBJECTIVE:  PATIENT SURVEYS:  FOTO 29% functional status  MUSCLE LENGTH: Hamstring WFL, tight hip flexor and quads  POSTURE:   Rounded shoulders, decreased lumbar  lordosis  PALPATION: Generalized tenderness of lumbar parapsinals  LUMBAR ROM:   AROM eval  Flexion 50%  Extension 50%  Right lateral flexion 50%  Left lateral flexion 50%  Right rotation 50%  Left rotation 50%   (Blank rows = not tested)  LOWER EXTREMITY MMT:    MMT Right eval Left eval  Hip flexion 4- 4-  Hip extension 3 3  Hip abduction 3 3  Hip adduction    Hip internal rotation    Hip external rotation    Knee flexion 4 4+  Knee extension 4+ 4+  Ankle dorsiflexion    Ankle plantarflexion    Ankle inversion    Ankle eversion     (Blank rows = not tested)  LUMBAR SPECIAL TESTS:  Radicular testing negative  FUNCTIONAL TESTS:  Not assessed  GAIT: Assistive device utilized: None Level of assistance: Complete Independence Comments: Decreased gait speed, slight forward trunk lean, crouch posture   TODAY'S TREATMENT:       [facility] Adult PT Treatment:                                                DATE: 02/06/2023 Therapeutic Exercise: NuStep L5 x 5 min with UE/LE while taking subjective LTR x 10 Piriformis stretch 2 x 20 sec each Hooklying unilateral bent knee fall out x 10 each SLR x 10 each Bridge x 10 Hooklying clamshell with red x 15 Hooklying alternating march x 20 Seated physioball roll out x 10 Sit to stand x 10   [facility] Adult PT Treatment:                                                DATE: 01/28/2023 Therapeutic Exercise: LTR x 10 SLR x 10 each Bridge x 10 Hooklying clamshell with red x 10  PATIENT EDUCATION:  Education details: HEP update Person educated: Patient Education method: Explanation, Demonstration, Tactile cues, Verbal cues, and Handouts Education comprehension: verbalized understanding, returned demonstration, verbal cues required, tactile cues required, and needs further education  HOME EXERCISE PROGRAM: Access Code: [email or web address]   ASSESSMENT: CLINICAL IMPRESSION: Patient tolerated therapy well with no adverse effects. Therapy  focused on stretching for the lumbar and hip region, and progressing her core, hip, and LE strengthening with good tolerance. She did note increased left hip pain this visit and greater difficulty performing exercises on left side. Updated her HEP to progress LE strengthening for home. Patient would benefit from continued skilled PT to progress her mobility and strength in  order to reduce pain and maximize functional ability.    OBJECTIVE IMPAIRMENTS: Abnormal gait, decreased activity tolerance, decreased ROM, decreased strength, impaired flexibility, postural dysfunction, and pain.   ACTIVITY LIMITATIONS: lifting, bending, sitting, standing, squatting, bed mobility, and locomotion level  PARTICIPATION LIMITATIONS: meal prep, cleaning, laundry, shopping, and community activity  PERSONAL FACTORS: Fitness, Past/current experiences, Social background, Time since onset of injury/illness/exacerbation, and 3+ comorbidities: see PMH above  are also affecting patient's functional outcome.    GOALS: Goals reviewed with patient? Yes  SHORT TERM GOALS: Target date: 02/25/2023  Patient will be I with initial HEP in order to progress with therapy. Baseline: HEP provided at eval Goal status: INITIAL  2.  Patient will report low back pain </= 5/10 in order to reduce functional limitations Baseline: 7/10 Goal status: INITIAL  LONG TERM GOALS: Target date: 03/25/2023  Patient will be I with final HEP to maintain progress from PT. Baseline: HEP provided at eval Goal status: INITIAL  2.  Patient will report >/= 43% status on FOTO to indicate improved functional ability. Baseline: 29% functional status Goal status: INITIAL  3.  Patient will demonstrate 25% improvement in lumbar motion in all planes in order to improve her mobility and ability to complete household tasks  Baseline: grossly 50% lumbar motion Goal status: INITIAL  4.  Patient will demonstrate gross hip and core strength >/= 4-/5 MMT in  order to improve her walking and activity tolerance Baseline: grossly 3/5 MMT hip and core strength Goal status: INITIAL   PLAN: PT FREQUENCY: 1x/week  PT DURATION: 8 weeks  PLANNED INTERVENTIONS: Therapeutic exercises, Therapeutic activity, Neuromuscular re-education, Balance training, Gait training, Patient/Family education, Self Care, Joint mobilization, Joint manipulation, Aquatic Therapy, Dry Needling, Electrical stimulation, Spinal manipulation, Spinal mobilization, Cryotherapy, Moist heat, Traction, Manual therapy, and Re-evaluation.  PLAN FOR NEXT SESSION: Review HEP and progress PRN, lumbar mobility and stretching for hips, progress core, hip, and LE strengthening as tolerated   Rosana Hoes, PT, DPT, LAT, ATC 02/06/23  2:57 PM Phone: [phone] Fax: [phone]   PHYSICAL THERAPY DISCHARGE SUMMARY  Visits from Start of Care: 2  Current functional level related to goals / functional outcomes: See above   Remaining deficits: See above   Education / Equipment: HEP   Patient agrees to discharge. Patient goals were not met. Patient is being discharged due to not returning since the last visit.  Rosana Hoes, PT, DPT, LAT, ATC 04/15/23  12:57 PM Phone: [phone] Fax: [phone]

## 2023-02-11 NOTE — Pre-Procedure Instructions (Signed)
Surgical Instructions   Your procedure is scheduled on February 20, 2023. Report to [facility] Main Entrance "A" at 9:15 A.M., then check in with the Admitting office. Any questions or running late day of surgery: call [phone]  Questions prior to your surgery date: call [phone], Monday-Friday, 8am-4pm. If you experience any cold or flu symptoms such as cough, fever, chills, shortness of breath, etc. between now and your scheduled surgery, please notify us at the above number.     Remember:  Do not eat after midnight the night before your surgery   You may drink clear liquids until 8:15 AM the morning of your surgery.   Clear liquids allowed are: Water, Non-Citrus Juices (without pulp), Carbonated Beverages, Clear Tea, Black Coffee Only (NO MILK, CREAM OR POWDERED CREAMER of any kind), and Gatorade.    Take these medicines the morning of surgery with A SIP OF WATER: fluticasone (FLONASE) nasal spray  gabapentin (NEURONTIN)  omeprazole (PRILOSEC)  XIIDRA eye drops   May take these medicines IF NEEDED: clonazePAM (KLONOPIN)  naloxone (NARCAN) nasal spray  oxyCODONE-acetaminophen (PERCOCET)  tiZANidine (ZANAFLEX)    Follow your surgeon's instructions on when to stop Aspirin.  If no instructions were given by your surgeon then you will need to call the office to get those instructions.     One week prior to surgery, STOP taking any Aleve, Naproxen, Ibuprofen, Motrin, Advil, Goody's, BC's, all herbal medications, fish oil, and non-prescription vitamins.   WHAT DO I DO ABOUT MY DIABETES MEDICATION?   Do not take metFORMIN (GLUCOPHAGE-XR the morning of surgery.   HOW TO MANAGE YOUR DIABETES BEFORE AND AFTER SURGERY  Why is it important to control my blood sugar before and after surgery? Improving blood sugar levels before and after surgery helps healing and can limit problems. A way of improving blood sugar control is eating a healthy diet by:  Eating less sugar and  carbohydrates  Increasing activity/exercise  Talking with your doctor about reaching your blood sugar goals High blood sugars (greater than 180 mg/dL) can raise your risk of infections and slow your recovery, so you will need to focus on controlling your diabetes during the weeks before surgery. Make sure that the doctor who takes care of your diabetes knows about your planned surgery including the date and location.  How do I manage my blood sugar before surgery? Check your blood sugar at least 4 times a day, starting 2 days before surgery, to make sure that the level is not too high or low.  Check your blood sugar the morning of your surgery when you wake up and every 2 hours until you get to the Short Stay unit.  If your blood sugar is less than 70 mg/dL, you will need to treat for low blood sugar: Do not take insulin. Treat a low blood sugar (less than 70 mg/dL) with  cup of clear juice (cranberry or apple), 4 glucose tablets, OR glucose gel. Recheck blood sugar in 15 minutes after treatment (to make sure it is greater than 70 mg/dL). If your blood sugar is not greater than 70 mg/dL on recheck, call [phone] for further instructions. Report your blood sugar to the short stay nurse when you get to Short Stay.  If you are admitted to the hospital after surgery: Your blood sugar will be checked by the staff and you will probably be given insulin after surgery (instead of oral diabetes medicines) to make sure you have good blood sugar levels. The goal  for blood sugar control after surgery is 80-180 mg/dL.                      Do NOT Smoke (Tobacco/Vaping) for 24 hours prior to your procedure.  If you use a CPAP at night, you may bring your mask/headgear for your overnight stay.   You will be asked to remove any contacts, glasses, piercing's, hearing aid's, dentures/partials prior to surgery. Please bring cases for these items if needed.    Patients discharged the day of surgery will  not be allowed to drive home, and someone needs to stay with them for 24 hours.  SURGICAL WAITING ROOM VISITATION Patients may have no more than 2 support people in the waiting area - these visitors may rotate.   Pre-op nurse will coordinate an appropriate time for 1 ADULT support person, who may not rotate, to accompany patient in pre-op.  Children under the age of 21 must have an adult with them who is not the patient and must remain in the main waiting area with an adult.  If the patient needs to stay at the hospital during part of their recovery, the visitor guidelines for inpatient rooms apply.  Please refer to the [facility] website for the visitor guidelines for any additional information.   If you received a COVID test during your pre-op visit  it is requested that you wear a mask when out in public, stay away from anyone that may not be feeling well and notify your surgeon if you develop symptoms. If you have been in contact with anyone that has tested positive in the last 10 days please notify you surgeon.      Pre-operative CHG Bathing Instructions   You can play a key role in reducing the risk of infection after surgery. Your skin needs to be as free of germs as possible. You can reduce the number of germs on your skin by washing with CHG (chlorhexidine gluconate) soap before surgery. CHG is an antiseptic soap that kills germs and continues to kill germs even after washing.   DO NOT use if you have an allergy to chlorhexidine/CHG or antibacterial soaps. If your skin becomes reddened or irritated, stop using the CHG and notify one of our RNs at [phone].              TAKE A SHOWER THE NIGHT BEFORE SURGERY AND THE DAY OF SURGERY    Please keep in mind the following:  DO NOT shave, including legs and underarms, 48 hours prior to surgery.   You may shave your face before/day of surgery.  Place clean sheets on your bed the night before surgery Use a clean washcloth (not used  since being washed) for each shower. DO NOT sleep with pet's night before surgery.  CHG Shower Instructions:  If you choose to wash your hair and private area, wash first with your normal shampoo/soap.  After you use shampoo/soap, rinse your hair and body thoroughly to remove shampoo/soap residue.  Turn the water OFF and apply half the bottle of CHG soap to a CLEAN washcloth.  Apply CHG soap ONLY FROM YOUR NECK DOWN TO YOUR TOES (washing for 3-5 minutes)  DO NOT use CHG soap on face, private areas, open wounds, or sores.  Pay special attention to the area where your surgery is being performed.  If you are having back surgery, having someone wash your back for you may be helpful. Wait 2 minutes after CHG  soap is applied, then you may rinse off the CHG soap.  Pat dry with a clean towel  Put on clean pajamas    Additional instructions for the day of surgery: DO NOT APPLY any lotions, deodorants, cologne, or perfumes.   Do not wear jewelry or makeup Do not wear nail polish, gel polish, artificial nails, or any other type of covering on natural nails (fingers and toes) Do not bring valuables to the hospital. [facility] is not responsible for valuables/personal belongings. Put on clean/comfortable clothes.  Please brush your teeth.  Ask your nurse before applying any prescription medications to the skin.

## 2023-02-12 ENCOUNTER — Encounter (HOSPITAL_COMMUNITY)
Admission: RE | Admit: 2023-02-12 | Discharge: 2023-02-12 | Disposition: A | Discharge status: Home / Self Care | Payer: 59 | Source: Ambulatory Visit | Attending: Otolaryngology | Admitting: Otolaryngology

## 2023-02-12 ENCOUNTER — Other Ambulatory Visit: Payer: Self-pay

## 2023-02-12 ENCOUNTER — Encounter (HOSPITAL_COMMUNITY): Payer: Self-pay

## 2023-02-12 ENCOUNTER — Other Ambulatory Visit: Payer: Self-pay | Admitting: Otolaryngology

## 2023-02-12 VITALS — BP 142/86 | HR 82 | Temp 98.5°F | Resp 18 | Ht 65.0 in | Wt 186.5 lb

## 2023-02-12 DIAGNOSIS — E119 Type 2 diabetes mellitus without complications: Secondary | ICD-10-CM

## 2023-02-12 DIAGNOSIS — Z01812 Encounter for preprocedural laboratory examination: Secondary | ICD-10-CM | POA: Insufficient documentation

## 2023-02-12 DIAGNOSIS — I10 Essential (primary) hypertension: Secondary | ICD-10-CM | POA: Diagnosis not present

## 2023-02-12 DIAGNOSIS — M1990 Unspecified osteoarthritis, unspecified site: Secondary | ICD-10-CM | POA: Diagnosis not present

## 2023-02-12 DIAGNOSIS — K219 Gastro-esophageal reflux disease without esophagitis: Secondary | ICD-10-CM | POA: Insufficient documentation

## 2023-02-12 DIAGNOSIS — Z9089 Acquired absence of other organs: Secondary | ICD-10-CM | POA: Diagnosis not present

## 2023-02-12 DIAGNOSIS — Z96653 Presence of artificial knee joint, bilateral: Secondary | ICD-10-CM | POA: Diagnosis not present

## 2023-02-12 DIAGNOSIS — F17200 Nicotine dependence, unspecified, uncomplicated: Secondary | ICD-10-CM | POA: Insufficient documentation

## 2023-02-12 DIAGNOSIS — Z8673 Personal history of transient ischemic attack (TIA), and cerebral infarction without residual deficits: Secondary | ICD-10-CM | POA: Diagnosis not present

## 2023-02-12 HISTORY — DX: Gastro-esophageal reflux disease without esophagitis: K21.9

## 2023-02-12 LAB — GLUCOSE, CAPILLARY: Glucose-Capillary: 87 mg/dL (ref 70–99)

## 2023-02-12 LAB — CBC
HCT: 41.3 % (ref 36.0–46.0)
Hemoglobin: 13.7 g/dL (ref 12.0–15.0)
MCH: 32.2 pg (ref 26.0–34.0)
MCHC: 33.2 g/dL (ref 30.0–36.0)
MCV: 96.9 fL (ref 80.0–100.0)
Platelets: 243 10*3/uL (ref 150–400)
RBC: 4.26 MIL/uL (ref 3.87–5.11)
RDW: 12 % (ref 11.5–15.5)
WBC: 4.2 10*3/uL (ref 4.0–10.5)
nRBC: 0 % (ref 0.0–0.2)

## 2023-02-12 LAB — BASIC METABOLIC PANEL
Anion gap: 7 (ref 5–15)
BUN: 11 mg/dL (ref 8–23)
CO2: 27 mmol/L (ref 22–32)
Calcium: 8.8 mg/dL — ABNORMAL LOW (ref 8.9–10.3)
Chloride: 106 mmol/L (ref 98–111)
Creatinine, Ser: 0.83 mg/dL (ref 0.44–1.00)
GFR, Estimated: >60 mL/min (ref >60)
Glucose, Bld: 84 mg/dL (ref 70–99)
Potassium: 4 mmol/L (ref 3.5–5.1)
Sodium: 140 mmol/L (ref 135–145)

## 2023-02-12 NOTE — Progress Notes (Addendum)
PCP - Dr. Elberta Fortis Cardiologist - Denies  PPM/ICD - Denies Device Orders - n/a Rep Notified - n/a  Chest x-ray - Denies EKG - 04/17/2023 Stress Test - Per pt, 20+ years ago ECHO - Denies Cardiac Cath - Denies  Sleep Study - Denies CPAP - n/a  Pt is DM2. She does not own a blood sugar meter, therefore does not know normal fasting range. CBG at pre-op 87. Last A1c was 6.2 January 24, 2023.  Last dose of GLP1 agonist- n/a GLP1 instructions: n/a  Blood Thinner Instructions: n/a Aspirin Instructions: Pt to hold ASA one week prior to surgery. Last dose will be today, August 20th.  ERAS Protcol - Clear liquids until 0815 morning of surgery PRE-SURGERY Ensure or G2- n/a  COVID TEST- n/a   Anesthesia review: Yes. Medical Clearance. Hx of CVA with residual left sided weakness, HTN, DM2.     Patient denies shortness of breath, fever, cough and chest pain at PAT appointment. Pt denies any respiratory illness/infection in the last two months.   All instructions explained to the patient, with a verbal understanding of the material. Patient agrees to go over the instructions while at home for a better understanding. Patient also instructed to self quarantine after being tested for COVID-19. The opportunity to ask questions was provided.

## 2023-02-13 ENCOUNTER — Ambulatory Visit: Payer: 59 | Admitting: Physical Therapy

## 2023-02-13 NOTE — Progress Notes (Signed)
Anesthesia Chart Review:  Case: [id number] Date/Time: 02/20/23 1100   Procedure: SUPERFICIAL PAROTIDECTOMY WITH FACIAL NERVE MONITORING (Left)   Anesthesia type: General   Pre-op diagnosis: Left parotid mass   [facility] ROOM 09 / [facility] OR   Surgeons: Scarlette Ar, MD       DISCUSSION: Patient is a 67 year old female scheduled for the above procedure. 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  She had PCP Elberta Fortis, MD follow-up on 01/25/23 and completed pre-operative paperwork for surgeon. She did not feel patient needed to hold ASA unless desired by surgeon. HTN controlled. A1c 6.2%. Smoking cessation recommended. Referred to PT for chronic pain, likely due to OA. Patient with also reportedly seeing Pain Management on 02/15/23. Patient reported last aspirin 02/12/2023.  Anesthesia team to evaluate on the day of surgery.   VS: BP (!) 142/86   Pulse 82   Temp 36.9 C (Oral)   Resp 18   Ht 5\' 5"  (1.651 m)   Wt 84.6 kg   SpO2 100%   BMI 31.04 kg/m    PROVIDERS: Elberta Fortis, MD is PCP  Janet Berlin, MD is neurologist ([facility])   LABS: Preoperative labs noted. A1c 6.2% 01/24/23. AST/ALT normal 06/07/22. (all labs ordered are listed, but only abnormal results are displayed)  Labs Reviewed  BASIC METABOLIC PANEL - Abnormal; Notable for the following components:      Result Value   Calcium 8.8 (*)    All other components within normal limits  GLUCOSE, CAPILLARY  CBC     IMAGES: MRI Brain 06/15/22: IMPRESSION: 1. No acute intracranial abnormality. 2. Chronic right temporoparietal infarct accounting for the abnormality on CT. 3. Mild chronic small vessel  ischemic disease.  CT Soft Tissue Neck 05/14/22: IMPRESSION: 1. 4.1 x 2.4 x 2.8 cm slightly heterogeneous hyperdense mass lesion in the inferolateral aspect of the left parotid gland. This most likely represents a benign neoplasm such as a Warthin's tumor or pleomorphic adenoma. 2. Increased size of heterogeneous nodule in the left lobe of the thyroid, now measuring 3.3 x 3.5 x 0.6 cm. Although this has been biopsied in the past, interval growth is concerning. Recommend repeat thyroid ultrasound. 3. Asymmetric subcortical white matter hypoattenuation within the right temporal lobe. Recommend MRI of the brain without and with contrast for further evaluation. While this most likely represents remote ischemia, focal mass lesions are not excluded. 4. Mild degenerative changes in the lower cervical spine.   EKG: 04/11/22: Sinus rhythm with Premature atrial complexes Otherwise normal ECG When compared with ECG of 13-Dec-2020 13:26, No significant change was found Confirmed by End, Christopher [phone]) on 04/11/2022 6:13:28 PM   CV: She reported a stress test greater than 20 years ago. She likely had an echo as part of her CVA work-up in 2010. Records are not currently available.    Past Medical History:  Diagnosis Date   Arthritis    Cerebrovascular disease    Depression    GERD (gastroesophageal reflux disease)    Headache    Migraines   Hypertension    Stroke [facility]) 2010   Left sided weakness   Type 2 diabetes mellitus (HCC)     Past Surgical History:  Procedure Laterality Date   bilateral knee replacements  CATARACT EXTRACTION, BILATERAL     FRACTURE SURGERY Right    arm as a child   gallstones removed     I & D KNEE WITH POLY EXCHANGE Right 12/16/2020   Procedure: POLY EXCHANGE RIGHT KNEE;  Surgeon: Kathryne Hitch, MD;  Location: [facility] ORS;  Service: Orthopedics;  Laterality: Right;   KNEE ARTHROSCOPY Bilateral    left carpal tunnel release      REVERSE  SHOULDER ARTHROPLASTY Left 04/23/2022   Procedure: LEFT REVERSE SHOULDER ARTHROPLASTY;  Surgeon: Huel Cote, MD;  Location: [facility] OR;  Service: Orthopedics;  Laterality: Left;   SHOULDER ARTHROSCOPY Bilateral    THYROIDECTOMY      MEDICATIONS:  aspirin EC 81 MG tablet   calcium-vitamin D (OSCAL WITH D) 500-5 MG-MCG tablet   ciclopirox (PENLAC) 8 % solution   clonazePAM (KLONOPIN) 0.25 MG disintegrating tablet   fluticasone (FLONASE) 50 MCG/ACT nasal spray   gabapentin (NEURONTIN) 800 MG tablet   losartan (COZAAR) 50 MG tablet   Menthol, Topical Analgesic, (ICY HOT EX)   metFORMIN (GLUCOPHAGE-XR) 500 MG 24 hr tablet   naloxone (NARCAN) nasal spray 4 mg/0.1 mL   omeprazole (PRILOSEC) 20 MG capsule   oxyCODONE-acetaminophen (PERCOCET) 10-325 MG tablet   polyethylene glycol (MIRALAX) 17 g packet   psyllium (METAMUCIL) 28 % packet   tiZANidine (ZANAFLEX) 4 MG tablet   XIIDRA 5 % SOLN   No current facility-administered medications for this encounter.    Shonna Chock, PA-C Surgical Short Stay/Anesthesiology [facility] Phone [phone] [facility] Phone [phone] 02/13/2023 1:49 PM

## 2023-02-13 NOTE — Anesthesia Preprocedure Evaluation (Addendum)
Anesthesia Evaluation  Patient identified by MRN, date of birth, ID band Patient awake    Reviewed: Allergy & Precautions, NPO status , Patient's Chart, lab work & pertinent test results  History of Anesthesia Complications Negative for: history of anesthetic complications  Airway Mallampati: II  TM Distance: >3 FB Neck ROM: Full    Dental  (+) Edentulous Upper, Missing,    Pulmonary Current Smoker and Patient abstained from smoking.   Pulmonary exam normal        Cardiovascular hypertension, Pt. on medications Normal cardiovascular exam     Neuro/Psych    Depression    CVA (2010, left weakness), Residual Symptoms    GI/Hepatic Neg liver ROS,GERD  ,,  Endo/Other  diabetes, Type 2, Oral Hypoglycemic Agents    Renal/GU negative Renal ROS  negative genitourinary   Musculoskeletal negative musculoskeletal ROS (+)    Abdominal   Peds  Hematology negative hematology ROS (+)   Anesthesia Other Findings Left parotid mass  Reproductive/Obstetrics negative OB ROS                              Anesthesia Physical Anesthesia Plan  ASA: 3  Anesthesia Plan: General   Post-op Pain Management: Tylenol PO (pre-op)*   Induction: Intravenous  PONV Risk Score and Plan: 3 and Treatment may vary due to age or medical condition, Ondansetron, Dexamethasone and Midazolam  Airway Management Planned: Oral ETT  Additional Equipment: None  Intra-op Plan:   Post-operative Plan: Extubation in OR  Informed Consent: I have reviewed the patients History and Physical, chart, labs and discussed the procedure including the risks, benefits and alternatives for the proposed anesthesia with the patient or authorized representative who has indicated his/her understanding and acceptance.     Dental advisory given  Plan Discussed with: CRNA  Anesthesia Plan Comments: (PAT note written 02/13/2023 by Shonna Chock, PA-C.  )        Anesthesia Quick Evaluation

## 2023-02-18 ENCOUNTER — Ambulatory Visit: Payer: 59 | Admitting: Physical Therapy

## 2023-02-20 ENCOUNTER — Observation Stay (HOSPITAL_COMMUNITY)
Admission: RE | Admit: 2023-02-20 | Discharge: 2023-02-21 | Disposition: A | Discharge status: Home / Self Care | Payer: 59 | Attending: Otolaryngology | Admitting: Otolaryngology

## 2023-02-20 ENCOUNTER — Encounter (HOSPITAL_COMMUNITY)
Admission: RE | Disposition: A | Discharge status: Home / Self Care | Payer: Self-pay | Source: Home / Self Care | Attending: Otolaryngology

## 2023-02-20 ENCOUNTER — Encounter (HOSPITAL_COMMUNITY): Payer: Self-pay | Admitting: Otolaryngology

## 2023-02-20 ENCOUNTER — Ambulatory Visit (HOSPITAL_BASED_OUTPATIENT_CLINIC_OR_DEPARTMENT_OTHER): Payer: 59 | Admitting: Anesthesiology

## 2023-02-20 ENCOUNTER — Other Ambulatory Visit: Payer: Self-pay

## 2023-02-20 ENCOUNTER — Ambulatory Visit (HOSPITAL_COMMUNITY): Payer: 59 | Admitting: Vascular Surgery

## 2023-02-20 DIAGNOSIS — Z96612 Presence of left artificial shoulder joint: Secondary | ICD-10-CM | POA: Diagnosis not present

## 2023-02-20 DIAGNOSIS — D119 Benign neoplasm of major salivary gland, unspecified: Principal | ICD-10-CM | POA: Diagnosis present

## 2023-02-20 DIAGNOSIS — Z79899 Other long term (current) drug therapy: Secondary | ICD-10-CM | POA: Insufficient documentation

## 2023-02-20 DIAGNOSIS — Z96653 Presence of artificial knee joint, bilateral: Secondary | ICD-10-CM | POA: Diagnosis not present

## 2023-02-20 DIAGNOSIS — F17210 Nicotine dependence, cigarettes, uncomplicated: Secondary | ICD-10-CM

## 2023-02-20 DIAGNOSIS — I10 Essential (primary) hypertension: Secondary | ICD-10-CM | POA: Insufficient documentation

## 2023-02-20 DIAGNOSIS — D110 Benign neoplasm of parotid gland: Secondary | ICD-10-CM | POA: Diagnosis present

## 2023-02-20 DIAGNOSIS — Z8673 Personal history of transient ischemic attack (TIA), and cerebral infarction without residual deficits: Secondary | ICD-10-CM | POA: Insufficient documentation

## 2023-02-20 DIAGNOSIS — E119 Type 2 diabetes mellitus without complications: Secondary | ICD-10-CM

## 2023-02-20 DIAGNOSIS — Z7982 Long term (current) use of aspirin: Secondary | ICD-10-CM | POA: Diagnosis not present

## 2023-02-20 DIAGNOSIS — Z7984 Long term (current) use of oral hypoglycemic drugs: Secondary | ICD-10-CM | POA: Insufficient documentation

## 2023-02-20 HISTORY — PX: PAROTIDECTOMY: SHX2163

## 2023-02-20 LAB — GLUCOSE, CAPILLARY
Glucose-Capillary: 98 mg/dL (ref 70–99)
Glucose-Capillary: 101 mg/dL — ABNORMAL HIGH (ref 70–99)
Glucose-Capillary: 127 mg/dL — ABNORMAL HIGH (ref 70–99)

## 2023-02-20 LAB — HIV ANTIBODY (ROUTINE TESTING W REFLEX): HIV Screen 4th Generation wRfx: NONREACTIVE

## 2023-02-20 SURGERY — EXCISION, PAROTID GLAND
Anesthesia: General | Laterality: Left

## 2023-02-20 MED ORDER — BACITRACIN ZINC 500 UNIT/GM EX OINT
1.0000 | TOPICAL_OINTMENT | Freq: Three times a day (TID) | CUTANEOUS | Status: DC
  Administered 2023-02-20 – 2023-02-21 (×4): 1 via TOPICAL
  Filled 2023-02-20 (×2): qty 28.35

## 2023-02-20 MED ORDER — LIDOCAINE 2% (20 MG/ML) 5 ML SYRINGE
INTRAMUSCULAR | Status: AC
  Filled 2023-02-20: qty 5

## 2023-02-20 MED ORDER — LOSARTAN POTASSIUM 50 MG PO TABS
50.0000 mg | ORAL_TABLET | Freq: Every day | ORAL | Status: DC
  Administered 2023-02-21: 50 mg via ORAL
  Filled 2023-02-20: qty 1

## 2023-02-20 MED ORDER — ACETAMINOPHEN 500 MG PO TABS
ORAL_TABLET | ORAL | Status: AC
  Administered 2023-02-20: 1000 mg via ORAL
  Filled 2023-02-20: qty 2

## 2023-02-20 MED ORDER — TIZANIDINE HCL 2 MG PO TABS
4.0000 mg | ORAL_TABLET | Freq: Three times a day (TID) | ORAL | Status: DC | PRN
  Administered 2023-02-20: 4 mg via ORAL
  Filled 2023-02-20: qty 2

## 2023-02-20 MED ORDER — ORAL CARE MOUTH RINSE: 15.0000 mL | Freq: Once | OROMUCOSAL | Status: AC

## 2023-02-20 MED ORDER — 0.9 % SODIUM CHLORIDE (POUR BTL) OPTIME
TOPICAL | Status: DC | PRN
  Administered 2023-02-20: 1000 mL

## 2023-02-20 MED ORDER — PROPOFOL 10 MG/ML IV BOLUS
INTRAVENOUS | Status: DC | PRN
  Administered 2023-02-20: 20 mg via INTRAVENOUS
  Administered 2023-02-20: 130 mg via INTRAVENOUS

## 2023-02-20 MED ORDER — SODIUM CHLORIDE 0.9 % IV SOLN
0.0125 ug/kg/min | Freq: Once | INTRAVENOUS | Status: AC
  Administered 2023-02-20: .1 ug/kg/min via INTRAVENOUS
  Filled 2023-02-20: qty 2000

## 2023-02-20 MED ORDER — IBUPROFEN 400 MG PO TABS: 400.0000 mg | ORAL_TABLET | Freq: Four times a day (QID) | ORAL | Status: DC | PRN

## 2023-02-20 MED ORDER — EPINEPHRINE HCL (NASAL) 0.1 % NA SOLN
NASAL | Status: AC
  Filled 2023-02-20: qty 30

## 2023-02-20 MED ORDER — ONDANSETRON HCL 4 MG/2ML IJ SOLN
INTRAMUSCULAR | Status: AC
  Filled 2023-02-20: qty 2

## 2023-02-20 MED ORDER — FENTANYL CITRATE (PF) 250 MCG/5ML IJ SOLN
INTRAMUSCULAR | Status: DC | PRN
  Administered 2023-02-20 (×2): 50 ug via INTRAVENOUS

## 2023-02-20 MED ORDER — LACTATED RINGERS IV SOLN: INTRAVENOUS | Status: DC

## 2023-02-20 MED ORDER — EPHEDRINE SULFATE-NACL 50-0.9 MG/10ML-% IV SOSY
PREFILLED_SYRINGE | INTRAVENOUS | Status: DC | PRN
  Administered 2023-02-20: 15 mg via INTRAVENOUS
  Administered 2023-02-20: 10 mg via INTRAVENOUS

## 2023-02-20 MED ORDER — PHENYLEPHRINE 80 MCG/ML (10ML) SYRINGE FOR IV PUSH (FOR BLOOD PRESSURE SUPPORT)
PREFILLED_SYRINGE | INTRAVENOUS | Status: AC
  Filled 2023-02-20: qty 10

## 2023-02-20 MED ORDER — PROPOFOL 10 MG/ML IV BOLUS
INTRAVENOUS | Status: AC
  Filled 2023-02-20: qty 20

## 2023-02-20 MED ORDER — SUCCINYLCHOLINE CHLORIDE 200 MG/10ML IV SOSY
PREFILLED_SYRINGE | INTRAVENOUS | Status: AC
  Filled 2023-02-20: qty 10

## 2023-02-20 MED ORDER — PHENYLEPHRINE 80 MCG/ML (10ML) SYRINGE FOR IV PUSH (FOR BLOOD PRESSURE SUPPORT)
PREFILLED_SYRINGE | INTRAVENOUS | Status: DC | PRN
  Administered 2023-02-20: 80 ug via INTRAVENOUS
  Administered 2023-02-20: 160 ug via INTRAVENOUS

## 2023-02-20 MED ORDER — OXYCODONE HCL 5 MG PO TABS
5.0000 mg | ORAL_TABLET | ORAL | Status: DC | PRN
  Administered 2023-02-20 – 2023-02-21 (×5): 5 mg via ORAL
  Filled 2023-02-20 (×5): qty 1

## 2023-02-20 MED ORDER — GABAPENTIN 400 MG PO CAPS
800.0000 mg | ORAL_CAPSULE | Freq: Every day | ORAL | Status: DC
  Administered 2023-02-20: 800 mg via ORAL
  Filled 2023-02-20: qty 2

## 2023-02-20 MED ORDER — LIDOCAINE 2% (20 MG/ML) 5 ML SYRINGE
INTRAMUSCULAR | Status: DC | PRN
  Administered 2023-02-20: 60 mg via INTRAVENOUS

## 2023-02-20 MED ORDER — ACETAMINOPHEN 500 MG PO TABS: 1000.0000 mg | ORAL_TABLET | Freq: Once | ORAL | Status: AC

## 2023-02-20 MED ORDER — OXYCODONE HCL 5 MG/5ML PO SOLN: 5.0000 mg | Freq: Once | ORAL | Status: DC | PRN

## 2023-02-20 MED ORDER — CLONAZEPAM 0.25 MG PO TBDP
0.2500 mg | ORAL_TABLET | Freq: Two times a day (BID) | ORAL | Status: DC | PRN
  Administered 2023-02-21: 0.25 mg via ORAL
  Filled 2023-02-20: qty 1

## 2023-02-20 MED ORDER — SODIUM CHLORIDE (PF) 0.9 % IJ SOLN
INTRAMUSCULAR | Status: DC | PRN
Start: 2023-02-20 — End: 2023-02-20
  Administered 2023-02-20: 100 mL

## 2023-02-20 MED ORDER — EPINEPHRINE HCL (NASAL) 0.1 % NA SOLN
NASAL | Status: DC | PRN
  Administered 2023-02-20: 1 mL via TOPICAL

## 2023-02-20 MED ORDER — CEFAZOLIN SODIUM-DEXTROSE 2-4 GM/100ML-% IV SOLN
INTRAVENOUS | Status: AC
  Filled 2023-02-20: qty 100

## 2023-02-20 MED ORDER — MIDAZOLAM HCL 2 MG/2ML IJ SOLN
INTRAMUSCULAR | Status: AC
  Filled 2023-02-20: qty 2

## 2023-02-20 MED ORDER — DEXAMETHASONE SODIUM PHOSPHATE 10 MG/ML IJ SOLN
INTRAMUSCULAR | Status: DC | PRN
  Administered 2023-02-20: 10 mg via INTRAVENOUS

## 2023-02-20 MED ORDER — CEFAZOLIN SODIUM-DEXTROSE 2-4 GM/100ML-% IV SOLN
2.0000 g | INTRAVENOUS | Status: AC
  Administered 2023-02-20: 2 g via INTRAVENOUS

## 2023-02-20 MED ORDER — LIDOCAINE-EPINEPHRINE 1 %-1:100000 IJ SOLN
INTRAMUSCULAR | Status: AC
  Filled 2023-02-20: qty 1

## 2023-02-20 MED ORDER — BUPIVACAINE HCL (PF) 0.25 % IJ SOLN
INTRAMUSCULAR | Status: AC
  Filled 2023-02-20: qty 30

## 2023-02-20 MED ORDER — ACETAMINOPHEN 325 MG PO TABS
650.0000 mg | ORAL_TABLET | Freq: Four times a day (QID) | ORAL | Status: DC | PRN
  Administered 2023-02-20: 650 mg via ORAL
  Filled 2023-02-20: qty 2

## 2023-02-20 MED ORDER — OXYCODONE HCL 5 MG PO TABS: 5.0000 mg | ORAL_TABLET | Freq: Once | ORAL | Status: DC | PRN

## 2023-02-20 MED ORDER — SUCCINYLCHOLINE CHLORIDE 200 MG/10ML IV SOSY
PREFILLED_SYRINGE | INTRAVENOUS | Status: DC | PRN
  Administered 2023-02-20: 120 mg via INTRAVENOUS

## 2023-02-20 MED ORDER — BACITRACIN ZINC 500 UNIT/GM EX OINT
TOPICAL_OINTMENT | CUTANEOUS | Status: AC
  Filled 2023-02-20: qty 28.35

## 2023-02-20 MED ORDER — HEMOSTATIC AGENTS (NO CHARGE) OPTIME
TOPICAL | Status: DC | PRN
Start: 2023-02-20 — End: 2023-02-20
  Administered 2023-02-20: 1

## 2023-02-20 MED ORDER — ONDANSETRON HCL 4 MG/2ML IJ SOLN
INTRAMUSCULAR | Status: DC | PRN
Start: 2023-02-20 — End: 2023-02-20
  Administered 2023-02-20: 4 mg via INTRAVENOUS

## 2023-02-20 MED ORDER — SODIUM CHLORIDE 0.45 % IV SOLN: INTRAVENOUS | Status: DC

## 2023-02-20 MED ORDER — CHLORHEXIDINE GLUCONATE 0.12 % MT SOLN: 15.0000 mL | Freq: Once | OROMUCOSAL | Status: AC

## 2023-02-20 MED ORDER — EPHEDRINE 5 MG/ML INJ
INTRAVENOUS | Status: AC
  Filled 2023-02-20: qty 5

## 2023-02-20 MED ORDER — CHLORHEXIDINE GLUCONATE 0.12 % MT SOLN
OROMUCOSAL | Status: AC
  Administered 2023-02-20: 15 mL via OROMUCOSAL
  Filled 2023-02-20: qty 15

## 2023-02-20 MED ORDER — FENTANYL CITRATE (PF) 100 MCG/2ML IJ SOLN: 25.0000 ug | INTRAMUSCULAR | Status: DC | PRN

## 2023-02-20 MED ORDER — MIDAZOLAM HCL 2 MG/2ML IJ SOLN
INTRAMUSCULAR | Status: DC | PRN
  Administered 2023-02-20: 2 mg via INTRAVENOUS

## 2023-02-20 MED ORDER — DOCUSATE SODIUM 100 MG PO CAPS
100.0000 mg | ORAL_CAPSULE | Freq: Two times a day (BID) | ORAL | Status: DC
  Administered 2023-02-20 – 2023-02-21 (×3): 100 mg via ORAL
  Filled 2023-02-20 (×3): qty 1

## 2023-02-20 MED ORDER — DEXAMETHASONE SODIUM PHOSPHATE 10 MG/ML IJ SOLN
INTRAMUSCULAR | Status: AC
  Filled 2023-02-20: qty 1

## 2023-02-20 MED ORDER — FENTANYL CITRATE (PF) 250 MCG/5ML IJ SOLN
INTRAMUSCULAR | Status: AC
  Filled 2023-02-20: qty 5

## 2023-02-20 MED ORDER — PHENYLEPHRINE HCL-NACL 20-0.9 MG/250ML-% IV SOLN
INTRAVENOUS | Status: DC | PRN
  Administered 2023-02-20: 30 ug/min via INTRAVENOUS

## 2023-02-20 SURGICAL SUPPLY — 72 items
ATTRACTOMAT 16X20 MAGNETIC DRP (DRAPES) IMPLANT
BAG COUNTER SPONGE SURGICOUNT (BAG) ×1 IMPLANT
BAG SPNG CNTER NS LX DISP (BAG) ×1
BLADE SURG 12 STRL SS (BLADE) IMPLANT
BLADE SURG 15 STRL LF DISP TIS (BLADE) ×1 IMPLANT
BLADE SURG 15 STRL SS (BLADE) ×1
CANISTER SUCT 3000ML PPV (MISCELLANEOUS) ×1 IMPLANT
CLEANER TIP ELECTROSURG 2X2 (MISCELLANEOUS) ×1 IMPLANT
CLIP TI MEDIUM 24 (CLIP) ×1 IMPLANT
CLIP TI WIDE RED SMALL 24 (CLIP) ×1 IMPLANT
CORD BIPOLAR FORCEPS 12FT (ELECTRODE) ×1 IMPLANT
COVER SURGICAL LIGHT HANDLE (MISCELLANEOUS) ×1 IMPLANT
DRAIN CHANNEL 15F RND FF W/TCR (WOUND CARE) IMPLANT
DRAIN JP 10F RND RADIO (DRAIN) IMPLANT
DRAIN RELI 100 BL SUC LF ST (DRAIN) ×1
DRAPE INCISE 13X13 STRL (DRAPES) IMPLANT
DRAPE INCISE 23X17 STRL (DRAPES) ×1 IMPLANT
DRAPE INCISE IOBAN 23X17 STRL (DRAPES) ×1
DRAPE UTILITY XL STRL (DRAPES) IMPLANT
DRSG TEGADERM 2-3/8X2-3/4 SM (GAUZE/BANDAGES/DRESSINGS) ×2 IMPLANT
DRSG TEGADERM 4X4.75 (GAUZE/BANDAGES/DRESSINGS) IMPLANT
ELECT COATED BLADE 2.86 ST (ELECTRODE) ×1 IMPLANT
ELECT PAIRED SUBDERMAL (MISCELLANEOUS) ×1
ELECT REM PT RETURN 9FT ADLT (ELECTROSURGICAL) ×1
ELECTRODE PAIRED SUBDERMAL (MISCELLANEOUS) ×1 IMPLANT
ELECTRODE REM PT RTRN 9FT ADLT (ELECTROSURGICAL) ×1 IMPLANT
EVACUATOR SILICONE 100CC (DRAIN) IMPLANT
FORCEPS BIPOLAR SPETZLER 8 1.0 (NEUROSURGERY SUPPLIES) IMPLANT
GAUZE 4X4 16PLY ~~LOC~~+RFID DBL (SPONGE) IMPLANT
GLOVE BIO SURGEON STRL SZ7.5 (GLOVE) ×1 IMPLANT
GLOVE BIOGEL PI IND STRL 8 (GLOVE) ×1 IMPLANT
GOWN STRL REUS W/ TWL LRG LVL3 (GOWN DISPOSABLE) ×1 IMPLANT
GOWN STRL REUS W/ TWL XL LVL3 (GOWN DISPOSABLE) ×1 IMPLANT
GOWN STRL REUS W/TWL LRG LVL3 (GOWN DISPOSABLE) ×1
GOWN STRL REUS W/TWL XL LVL3 (GOWN DISPOSABLE) ×1
HEMOSTAT SURGICEL .5X2 ABSORB (HEMOSTASIS) IMPLANT
KIT BASIN OR (CUSTOM PROCEDURE TRAY) ×1 IMPLANT
KIT TURNOVER KIT B (KITS) ×1 IMPLANT
LOCATOR NERVE 3 VOLT (DISPOSABLE) IMPLANT
NDL FILTER BLUNT 18X1 1/2 (NEEDLE) ×1 IMPLANT
NDL PRECISIONGLIDE 27X1.5 (NEEDLE) ×1 IMPLANT
NEEDLE FILTER BLUNT 18X1 1/2 (NEEDLE) ×1
NEEDLE PRECISIONGLIDE 27X1.5 (NEEDLE) ×1
NS IRRIG 1000ML POUR BTL (IV SOLUTION) ×1 IMPLANT
PAD ARMBOARD 7.5X6 YLW CONV (MISCELLANEOUS) ×2 IMPLANT
PATTIES SURGICAL .5 X3 (DISPOSABLE) IMPLANT
PENCIL SMOKE EVACUATOR (MISCELLANEOUS) ×1 IMPLANT
POSITIONER HEAD DONUT 9IN (MISCELLANEOUS) ×1 IMPLANT
PROBE NERVBE PRASS .33 (MISCELLANEOUS) ×1 IMPLANT
SET WALTER ACTIVATION W/DRAPE (SET/KITS/TRAYS/PACK) IMPLANT
SPONGE INTESTINAL PEANUT (DISPOSABLE) ×1 IMPLANT
SPONGE T-LAP 18X18 ~~LOC~~+RFID (SPONGE) ×1 IMPLANT
STAPLER VISISTAT 35W (STAPLE) ×1 IMPLANT
SUT ETHILON 3 0 PS 1 (SUTURE) IMPLANT
SUT ETHILON 5 0 PS 2 18 (SUTURE) ×1 IMPLANT
SUT ETHILON 6 0 9-3 1X18 BLK (SUTURE) ×1 IMPLANT
SUT SILK 2 0 SH CR/8 (SUTURE) ×1 IMPLANT
SUT SILK 3 0 REEL (SUTURE) ×1 IMPLANT
SUT SILK 3 0 SH 30 (SUTURE) IMPLANT
SUT SILK 3 0 SH CR/8 (SUTURE) ×1 IMPLANT
SUT SILK 4 0 REEL (SUTURE) IMPLANT
SUT VIC AB 3-0 SH 27 (SUTURE) ×2
SUT VIC AB 3-0 SH 27X BRD (SUTURE) IMPLANT
SUT VIC AB 3-0 SH 8-18 (SUTURE) ×1 IMPLANT
SUT VIC AB 4-0 PS2 18 (SUTURE) ×1 IMPLANT
SUT VIC AB 4-0 SH 18 (SUTURE) IMPLANT
SUT VICRYL 4-0 PS2 18IN ABS (SUTURE) ×1 IMPLANT
SYR TB 1ML LUER SLIP (SYRINGE) ×1 IMPLANT
TOWEL GREEN STERILE FF (TOWEL DISPOSABLE) ×1 IMPLANT
TRAY ENT MC OR (CUSTOM PROCEDURE TRAY) ×1 IMPLANT
TRAY FOLEY MTR SLVR 14FR STAT (SET/KITS/TRAYS/PACK) IMPLANT
WATER STERILE IRR 1000ML POUR (IV SOLUTION) ×1 IMPLANT

## 2023-02-20 NOTE — Anesthesia Procedure Notes (Addendum)
Procedure Name: Intubation Date/Time: 02/20/2023 7:39 AM  Performed by: Darlina Guys, CRNAPre-anesthesia Checklist: Patient identified, Emergency Drugs available, Suction available, Patient being monitored and Timeout performed Patient Re-evaluated:Patient Re-evaluated prior to induction Oxygen Delivery Method: Circle system utilized Preoxygenation: Pre-oxygenation with 100% oxygen Induction Type: IV induction Ventilation: Mask ventilation without difficulty Laryngoscope Size: Glidescope and 4 Grade View: Grade I Tube type: Oral Tube size: 7.5 mm Number of attempts: 1 Airway Equipment and Method: Video-laryngoscopy and Stylet Placement Confirmation: ETT inserted through vocal cords under direct vision, CO2 detector, positive ETCO2 and breath sounds checked- equal and bilateral Secured at: 21 cm Tube secured with: Tape Dental Injury: Teeth and Oropharynx as per pre-operative assessment  Comments: Soft bite block inserted post-intubation

## 2023-02-20 NOTE — Transfer of Care (Signed)
Immediate Anesthesia Transfer of Care Note  Patient: Monica Watson  Procedure(s) Performed: SUPERFICIAL PAROTIDECTOMY WITH FACIAL NERVE MONITORING (Left)  Patient Location: PACU  Anesthesia Type:General  Level of Consciousness: awake and alert   Airway & Oxygen Therapy: Patient Spontanous Breathing  Post-op Assessment: Post -op Vital signs reviewed and stable and Patient moving all extremities X 4  Post vital signs: Reviewed and stable  Last Vitals:  Vitals Value Taken Time  BP 131/75 02/20/23 0954  Temp    Pulse 96 02/20/23 0957  Resp 9 02/20/23 0957  SpO2 100 % 02/20/23 0957  Vitals shown include unfiled device data.  Last Pain:  Vitals:   02/20/23 0603  PainSc: 0-No pain         Complications: No notable events documented.

## 2023-02-20 NOTE — Anesthesia Postprocedure Evaluation (Signed)
Anesthesia Post Note  Patient: Monica Watson  Procedure(s) Performed: SUPERFICIAL PAROTIDECTOMY WITH FACIAL NERVE MONITORING (Left)     Patient location during evaluation: PACU Anesthesia Type: General Level of consciousness: awake and alert Pain management: pain level controlled Vital Signs Assessment: post-procedure vital signs reviewed and stable Respiratory status: spontaneous breathing, nonlabored ventilation and respiratory function stable Cardiovascular status: blood pressure returned to baseline Postop Assessment: no apparent nausea or vomiting Anesthetic complications: no   No notable events documented.  Last Vitals:  Vitals:   02/20/23 1030 02/20/23 1045  BP: 124/81 124/84  Pulse: 92 90  Resp: 15 14  Temp:  36.4 C  SpO2: 96% 96%    Last Pain:  Vitals:   02/20/23 1045  PainSc: 0-No pain                 Shanda Howells

## 2023-02-20 NOTE — Op Note (Signed)
OPERATIVE NOTE  Loreatha Bacco Date/Time of Admission: 02/20/2023  5:01 AM  CSN: [id number];NWG:[id number] Attending Provider: Scarlette Ar, MD Room/Bed: MCPO/NONE DOB: [birth year] Age: 67 y.o.   Pre-Op Diagnosis: Left parotid mass  Post-Op Diagnosis: Left parotid mass  Procedure: Procedure(s): LEFT SUPERFICIAL PAROTIDECTOMY WITH FACIAL NERVE MONITORING  Anesthesia: General  Surgeon(s): Mervin Kung, MD  Staff: Circulator: Ivin Booty, RN Relief Circulator: Tawny Hopping, RN Relief Scrub: Carmela Rima Scrub Person: Hermelinda Dellen, RN; Perez-Vasquez, Tiffany  Implants: * No implants in log *  Specimens: ID Type Source Tests Collected by Time Destination  1 : Left parotid tumor Tissue PATH Other SURGICAL PATHOLOGY Scarlette Ar, MD 02/20/2023 [phone]     Complications: none  EBL: 30 ML  IVF: Per anesthesia ML  Condition: stable  Operative Findings:  Left parotid tail warthin's tumor excised completely with preservation of tumor capsule. Tumor dissected off marginal mandibular facial nerve branch  Description of Operation: Once operative consent was obtained and the site and surgery were confirmed with the patient and the operating room team, the patient was brought back to the operating room and general endotracheal anesthesia was obtained. She was rotated to 180 degrees. The patient was turned over to the ENT service. The NIM facial nerve monitor was attached to the frontalis, orbicularis oculi and orbicularis oris, and mentalis it was noted to be in good working condition.    A modified Blair incision was planned on the LEFT hand side and infiltrated with plain 1:100,000 epinephrine. The patient was prepped and draped in sterile fashion. Modified Blair incision was then made with a 15 blade. Superiorly, this incision was taken down in the subperichondrial plane, taken to the tragal cartilage until the tragal pointer was identified. Inferiorly,  in continuing the dissection, the external jugular vein was identified as well as the greater auricular nerve. The greater auricular nerve branch to the auricle was preserved while branches into the parotid gland were ligated with surgical clips.   A subplatysmal flap was elevated anteriorly and posteriorly in the inferior portion of the incision, and a mid SMAS flap was elevated with 15 blade and metzenbaum scissors anteriorly approximately 2 cm anterior to the location of the mass. This dissection was continued until it met with the superior dissection and that flap was elevated as well.  The anterior border of the sternocleidomastoid muscle was identified and followed until the posterior border of digastric muscle was identified. The posterior digastric muscle was followed to its attachment to the mastoid bone. With superior and inferior dissection plane clearly delineated, which estimated the depth and location of the facial nerve, the marginal mandibular nerve was identified near the mandibular notch and dissected retrograde towards the pes.   The mass was excised from the superficial parotid lobe with care taken to dissect free the marginal mandibular nerve. The mass was freed from the facial nerve and excised with preservation of the capsule. The mass was well encapsulated and easily dissected free from the surrounding parotid tissue.    Copious irrigation was placed into the wound and hemostasis was obtained. Facial nerve branches were examined and tested with the nerve monitor and intact. A size 15 french suction drain was placed into the incision posteriorly in the right neck and secured with 3-0 silk. The wound was closed with deep interrupted 3-0 Vicryl and 4-0 vicryl platysmal and deep dermal sutures and a combination of running and interrupted 6-0 and nylon suture for the skin. Bacitracin was applied  to the wound. The drain was placed on bulb suction. The patient was then turned over to the  anesthesiologist. She was extubated in the operating room and sent to the PACU in stable condition. The patient will be admitted overnight for drain care.    Mervin Kung, MD [facility]  02/20/2023

## 2023-02-20 NOTE — Plan of Care (Signed)

## 2023-02-20 NOTE — Progress Notes (Signed)
ENT Postop Note   Pod0 left parotidectomy for warthins tumor .  On the floor doing well in bed watching television. Mild pain. On exam that is soft and flat. Drain holding bulb suction with serosang output. Facial nerve is normal.  Doing very well anticipate discharge in the morning with drain .  Scarlette Ar MD

## 2023-02-20 NOTE — H&P (Signed)
Monica Watson is an 67 y.o. female.    Chief Complaint:  Left parotid warthin's tumor  HPI: Patient presents today for planned elective procedure.  He/she denies any interval change in history since office visit on 02/05/23.   Past Medical History:  Diagnosis Date   Arthritis    Cerebrovascular disease    Depression    GERD (gastroesophageal reflux disease)    Headache    Migraines   Hypertension    Stroke [facility]) 2010   Left sided weakness   Type 2 diabetes mellitus (HCC)     Past Surgical History:  Procedure Laterality Date   bilateral knee replacements      CATARACT EXTRACTION, BILATERAL     FRACTURE SURGERY Right    arm as a child   gallstones removed     I & D KNEE WITH POLY EXCHANGE Right 12/16/2020   Procedure: POLY EXCHANGE RIGHT KNEE;  Surgeon: Kathryne Hitch, MD;  Location: [facility];  Service: Orthopedics;  Laterality: Right;   KNEE ARTHROSCOPY Bilateral    left carpal tunnel release      REVERSE SHOULDER ARTHROPLASTY Left 04/23/2022   Procedure: LEFT REVERSE SHOULDER ARTHROPLASTY;  Surgeon: Huel Cote, MD;  Location: [facility] OR;  Service: Orthopedics;  Laterality: Left;   SHOULDER ARTHROSCOPY Bilateral    THYROIDECTOMY      Family History  Problem Relation Age of Onset   Colon cancer Neg Hx    Esophageal cancer Neg Hx    Stomach cancer Neg Hx     Social History:  reports that she has been smoking cigarettes. She has a 23 pack-year smoking history. She has been exposed to tobacco smoke. She has never used smokeless tobacco. She reports that she does not drink alcohol and does not use drugs.  Allergies: No Known Allergies  Medications Prior to Admission  Medication Sig Dispense Refill   aspirin EC 81 MG tablet Take 1 tablet (81 mg total) by mouth daily. Swallow whole. 90 tablet 1   calcium-vitamin D (OSCAL WITH D) 500-5 MG-MCG tablet Take 1 tablet by mouth daily with breakfast. 90 tablet 3   ciclopirox (PENLAC) 8 % solution APPLY 1 COAT TO TOENAIL  DAILY, REMOVE WEEKLY WITH POLISH REMOVER (Patient taking differently: APPLY 1 COAT TO TOENAIL WEEKLY, REMOVE WEEKLY WITH POLISH REMOVER) 6.6 mL 11   clonazePAM (KLONOPIN) 0.25 MG disintegrating tablet Take 0.25 mg by mouth 2 (two) times daily as needed (restless legs).     fluticasone (FLONASE) 50 MCG/ACT nasal spray Place 2 sprays into both nostrils daily. 16 g 6   gabapentin (NEURONTIN) 800 MG tablet Take 1 tablet (800 mg total) by mouth at bedtime. (Patient taking differently: Take 800 mg by mouth 2 (two) times daily.) 30 tablet 0   losartan (COZAAR) 50 MG tablet Take 1 tablet (50 mg total) by mouth daily. 90 tablet 3   metFORMIN (GLUCOPHAGE-XR) 500 MG 24 hr tablet Take one tablet daily for a week. If you do well with that, increase to one tablet in the morning and one in the evening. (Patient taking differently: Take 500 mg by mouth every other day.) 180 tablet 3   naloxone (NARCAN) nasal spray 4 mg/0.1 mL Place 0.4 mg into the nose daily as needed (opioid overdose).     omeprazole (PRILOSEC) 20 MG capsule Take 20 mg by mouth daily.     oxyCODONE-acetaminophen (PERCOCET) 10-325 MG tablet Take 1 tablet by mouth every 6 (six) hours as needed for pain. 30 tablet 0  polyethylene glycol (MIRALAX) 17 g packet Take 17 g by mouth daily. (Patient taking differently: Take 17 g by mouth daily as needed for moderate constipation.) 90 each 0   tiZANidine (ZANAFLEX) 4 MG tablet Take 1 tablet (4 mg total) by mouth every 8 (eight) hours as needed for muscle spasms. 30 tablet 0   XIIDRA 5 % SOLN Apply 1 drop to eye 2 (two) times daily.     Menthol, Topical Analgesic, (ICY HOT EX) Apply 1 Application topically daily as needed (knee pain).     psyllium (METAMUCIL) 28 % packet Take 1 packet by mouth 2 (two) times daily. (Patient taking differently: Take 1 packet by mouth daily as needed (constipation).) 30 each 1    Results for orders placed or performed during the hospital encounter of 02/20/23 (from the past 48  hour(s))  Glucose, capillary     Status: None   Collection Time: 02/20/23  6:01 AM  Result Value Ref Range   Glucose-Capillary 98 70 - 99 mg/dL    Comment: Glucose reference range applies only to samples taken after fasting for at least 8 hours.   No results found.  ROS: negative other than stated in HPI  Blood pressure 134/75, pulse 66, temperature 98.4 F (36.9 C), resp. rate 16, height 5\' 5"  (1.651 m), weight 84.6 kg, SpO2 96%.  PHYSICAL EXAM: General: Resting comfortably in NAD  Lungs: Non-labored respiratinos  Studies Reviewed: CT neck, Biopsy.    Assessment/Plan Left parotid warthin's tumor.  Proceed with left parotidectomy with overnight admission. Informed consent obtained. RBA discussed    Electronically signed by:  Scarlette Ar, MD  Staff Physician Facial Plastic & Reconstructive Surgery Otolaryngology - Head and Neck Surgery [facility]  02/20/2023, 7:27 AM

## 2023-02-21 ENCOUNTER — Encounter (HOSPITAL_COMMUNITY): Payer: Self-pay | Admitting: Otolaryngology

## 2023-02-21 DIAGNOSIS — D110 Benign neoplasm of parotid gland: Secondary | ICD-10-CM | POA: Diagnosis not present

## 2023-02-21 MED ORDER — OXYCODONE HCL 5 MG PO TABS: 5.0000 mg | ORAL_TABLET | Freq: Four times a day (QID) | ORAL | 0 refills | Status: AC | PRN

## 2023-02-21 MED ORDER — ACETAMINOPHEN 325 MG PO TABS: 650.0000 mg | ORAL_TABLET | Freq: Four times a day (QID) | ORAL | 0 refills | Status: AC | PRN

## 2023-02-21 NOTE — Discharge Instructions (Signed)
STEVEN G. HOSHAL MD  HOME CARE INSTRUCTIONS FOLLOWING PAROTID GLAND SURGERY:  DIET:  - Patients who have received general anesthesia may experience nausea and occasionally, vomiting. It is therefore preferable to eat a bland light meal or a liquid diet on the first day after the surgery. Regular diet may be resumed the next day. Also, pain pills may cause nausea if taken on an empty stomach. It is preferable to take those pills with a piece of toast or some food. It is important to maintain hydration by drinking lots of fluids after the procedure.   ACTIVITY AND WOUND CARE:  - Elevate the head as much as possible. Sit in a recliner or use two or three pillows when sleeping in bed. Head elevation reduces bruising and swelling. Occasionally, you may notice that the bruises or swelling have migrated to other places (usually lower regions). You may have a dressing or your wound may be exposed.   - If your wound is not covered with a dressing keep the exposed wound dry. Avoid showers for the first 48 hours. After this you may shower, but avoid shower water directly hitting the surgical wound. If you do get the wound wet, pat dry the area immediately after your shower. If you have been instructed to use a topical antibiotic and have not received a prescription for antibiotic ointment, use over-the-counter triple antibiotic such as Neosporin. Apply a scanty amount on the suture line. At times, you may not see the sutures because they have been placed inside the wound.  MEDICATIONS:  - It is important to take all medications as prescribed by your surgeon. An antibiotic may be prescribed following the surgery. The patient also receives a prescription for narcotic pain killers. These products cause somnolence, drowsiness and constipation. Patients who take painkillers should not operate machinery, drive or make important decisions. - Do not use aspirin for 2 weeks; it increases the possibility of  bleeding.  WHAT TO EXPECT:  - For the first one to two weeks after surgery, there will be a mild to moderate amount of pain in the neck, side of the face, jaw, ear, or throat. This can be controlled with prescription pain medicine received at hospital discharge or Tylenol (acetaminophen). Ice may be applied to the affected area to help reduce pain and swelling. - You may have numbness, tingling and pain around the surgical site. This may last a long time but will become less noticeable with time.  - There may be discomfort during eating, and swallowing hard, crunchy foods may cause increased discomfort in the involved region. In time, this discomfort will improve. - Swelling in the surgical site is normal and will peak in 2-3 days. Call our office if the incision swells excessively, becomes increasingly red and or starts to drain. - If you experience dry eyes on the operative side, it is ok to use natural lubricating tears to keep the eye well moistened.   RESTRICTIONS:  - Bed rest and very light activity is the rule for the first 24 hours postoperatively. You may increase your activity level as necessary, but use common sense. - Refrain from vigorous activity for the 10 days after surgery. Do not lift objects weighing over 8-10 pounds (roughly the weight of a gallon of milk) for a minimum of 2 weeks following surgery.  - After surgery, you may shower below the neck. Avoid showering above the neck until at least 48 hours after surgery. We strongly discourage soaking the incision in   the bathtub, swimming pool, or hot tub until you have discussed this with your physician. - Driving is prohibited while taking prescription pain medicine. Additionally patients should not drive until for a minimum of 2 weeks (or as instructed by your physician) following surgery of the head and neck region. Surgery in this area may cause decreased range of motion making driving unsafe. Do not drive if you are unable to  look behind your shoulder comfortably.  WHAT ARE SOME REASONS TO CONTACT YOUR DOCTOR AFTER SURGERY?  - After parotid surgery there is a small risk for temporary or even permanent facial paralysis on the operative side. If you notice any increase in facial asymmetry after discharge, particularly with closing your eyes fully, or with an abnormal droop of the side of your mouth, please contact our office. - Some swelling around the incision is normal. However, if is any sudden significant increase in neck swelling, apply an ice pack to the neck and call our office. If this occurs after regular business hours, please call  [phone] and ask for the on call ENT resident. - If you develop and shortness of breath or difficulty, breathing please present to the nearest emergency department emergently for evaluation. - Many people will experience low-grade fevers after surgery that may persist for one to two days. Low grade fevers (less than 101 F) usually will resolve with Tylenol and fluids. If you have a high fever (greater than 101 F) that lasts longer than 24 hours without any improvement, please notify our service. - At any time during the postop period, please call the office if you have any questions or concerns about excessive bleeding, breathing difficulty, pain, persistent fever, nausea, swelling or other concerns that seem out of the ordinary from what you have discussed with your surgeon or read in this handout.  [facility] - [place] [address] , [place] [address]  Phone: [phone]    

## 2023-02-21 NOTE — Plan of Care (Signed)
  Problem: Health Behavior/Discharge Planning: Goal: Ability to manage health-related needs will improve Outcome: Completed/Met   Problem: Clinical Measurements: Goal: Ability to maintain clinical measurements within normal limits will improve Outcome: Progressing Goal: Will remain free from infection Outcome: Completed/Met Goal: Diagnostic test results will improve Outcome: Progressing Goal: Respiratory complications will improve Outcome: Progressing Goal: Cardiovascular complication will be avoided Outcome: Progressing   Problem: Activity: Goal: Risk for activity intolerance will decrease Outcome: Completed/Met   Problem: Nutrition: Goal: Adequate nutrition will be maintained Outcome: Progressing   Problem: Coping: Goal: Level of anxiety will decrease Outcome: Progressing   Problem: Elimination: Goal: Will not experience complications related to bowel motility Outcome: Completed/Met Goal: Will not experience complications related to urinary retention Outcome: Completed/Met   Problem: Pain Managment: Goal: General experience of comfort will improve Outcome: Progressing   Problem: Safety: Goal: Ability to remain free from injury will improve Outcome: Progressing   Problem: Skin Integrity: Goal: Risk for impaired skin integrity will decrease Outcome: Progressing

## 2023-02-21 NOTE — Care Management Obs Status (Signed)
MEDICARE OBSERVATION STATUS NOTIFICATION   Patient Details  Name: Monica Watson MRN: [id number] Date of Birth: May 20, 1956   Medicare Observation Status Notification Given:  Yes    Kingsley Plan, RN 02/21/2023, 12:12 PM

## 2023-02-21 NOTE — Discharge Summary (Signed)
Physician Discharge Summary  Patient ID: Monica Watson MRN: [id number] DOB/AGE: 08-03-1955 67 y.o.  Admit date: 02/20/2023 Discharge date: 02/21/2023  Admission Diagnoses:  Principal Problem:   Warthin's tumor   Discharge Diagnoses:  Same  Surgeries: Procedure(s): LEFT SUPERFICIAL PAROTIDECTOMY WITH FACIAL NERVE MONITORING on 02/20/2023   Discharged Condition: Improved  Hospital Course: Monica Watson is an 67 y.o. female who was admitted 02/20/2023 with a chief complaint of No chief complaint on file. 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  They were brought to the operating room on 02/20/2023 and underwent the above named procedures.    Physical Exam:  General: Awake and alert, no acute distress Neck: flaps down. Incision c/d/I. JP Drain bulb suction with s/s output. Facial nerve normal   Recent vital signs:  Vitals:   02/21/23 0726 02/21/23 1439  BP: 106/73 121/83  Pulse: 65 (!) 57  Resp: 17 17  Temp: 97.9 F (36.6 C) (!) 97.4 F (36.3 C)  SpO2: 99% 96%    Recent laboratory studies:  Results for orders placed or performed during the hospital encounter of 02/20/23  Glucose, capillary  Result Value Ref Range   Glucose-Capillary 98 70 - 99 mg/dL  Glucose, capillary  Result Value Ref Range   Glucose-Capillary 101 (H) 70 - 99 mg/dL  HIV Antibody (routine testing w rflx)  Result Value Ref Range   HIV Screen 4th Generation wRfx Non Reactive Non Reactive  Glucose, capillary  Result Value Ref Range   Glucose-Capillary 127 (H) 70 - 99 mg/dL   Comment 1 Notify RN   Surgical pathology  Result Value Ref Range   SURGICAL PATHOLOGY      SURGICAL PATHOLOGY CASE: [id number] PATIENT: Monica Watson Surgical Pathology Report     Clinical History: left parotid mass (cm)     FINAL MICROSCOPIC DIAGNOSIS:  A. PAROTID TUMOR, LEFT, PAROTIDECTOMY: - Warthin's tumor     GROSS DESCRIPTION:  Specimen is received fresh and consists of a 5.4 x 3.8 x  2.6 cm portion of tan-red soft tissue.  The outer surface is inked black, the specimen is serially sectioned to reveal a tan-pink, friable, circumscribed lesion filled with a moderate amount of tan thickened fluid.  No residual parotid parenchyma is identified.  Representative sections are submitted in 4 cassettes.  Monica Watson 02/20/2023)  Final Diagnosis performed by Holley Bouche, MD.   Electronically signed 02/21/2023 Technical component performed at [facility]. [facility], [address] [place], [place] [address].  Professional component performed at [facility], [address], [place], [place] [address].  Immunohistochemis try Technical component (if applicable) was performed at [facility]. [address], [place], [place] [address].   IMMUNOHISTOCHEMISTRY DISCLAIMER (if applicable): Some of these immunohistochemical stains may have been developed and the performance characteristics determine by [facility]. Some may not have been cleared or approved by the U.S. Food and Drug Administration. The FDA has determined that such clearance or approval is not necessary. This test is used for clinical purposes. It should not be regarded as investigational or for research. This laboratory is certified under the Clinical Laboratory Improvement Amendments of 1988 (CLIA-88) as qualified to perform high complexity clinical laboratory testing.  The controls stained appropriately.   IHC stains are performed on formalin fixed, paraffin embedded tissue using a 3,3"diaminobenzidine (DAB) chromogen and Leica Bond Autostainer System. The staining intensity of the nucle [place] is score manually and is reported as the percentage of tumor  cell nuclei demonstrating specific nuclear staining. The specimens are fixed in 10% Neutral Formalin for at least 6 hours and up to 72hrs. These tests are validated on decalcified tissue. Results should be  interpreted with caution given the possibility of false negative results on decalcified specimens. Antibody Clones are as follows ER-clone 31F, PR-clone 16, Ki67- clone MM1. Some of these immunohistochemical stains may have been developed and the performance characteristics determined by [facility].     Discharge Medications:   Allergies as of 02/21/2023   No Known Allergies      Medication List     STOP taking these medications    oxyCODONE-acetaminophen 10-325 MG tablet Commonly known as: PERCOCET       TAKE these medications    acetaminophen 325 MG tablet Commonly known as: TYLENOL Take 2 tablets (650 mg total) by mouth every 6 (six) hours as needed for up to 10 days for moderate pain, mild pain, fever or headache.   aspirin EC 81 MG tablet Take 1 tablet (81 mg total) by mouth daily. Swallow whole.   calcium-vitamin D 500-5 MG-MCG tablet Commonly known as: OSCAL WITH D Take 1 tablet by mouth daily with breakfast.   ciclopirox 8 % solution Commonly known as: PENLAC APPLY 1 COAT TO TOENAIL DAILY, REMOVE WEEKLY WITH POLISH REMOVER What changed: additional instructions   clonazePAM 0.25 MG disintegrating tablet Commonly known as: KLONOPIN Take 0.25 mg by mouth 2 (two) times daily as needed (restless legs).   fluticasone 50 MCG/ACT nasal spray Commonly known as: FLONASE Place 2 sprays into both nostrils daily.   gabapentin 800 MG tablet Commonly known as: NEURONTIN Take 1 tablet (800 mg total) by mouth at bedtime. What changed: when to take this   ICY HOT EX Apply 1 Application topically daily as needed (knee pain).   losartan 50 MG tablet Commonly known as: COZAAR Take 1 tablet (50 mg total) by mouth daily.   Metamucil 28 % packet Generic drug: psyllium Take 1 packet by mouth 2 (two) times daily. What changed:  when to take this reasons to take this   metFORMIN 500 MG 24 hr tablet Commonly known as: GLUCOPHAGE-XR Take one tablet daily for  a week. If you do well with that, increase to one tablet in the morning and one in the evening. What changed:  how much to take how to take this when to take this additional instructions   naloxone 4 MG/0.1ML Liqd nasal spray kit Commonly known as: NARCAN Place 0.4 mg into the nose daily as needed (opioid overdose).   omeprazole 20 MG capsule Commonly known as: PRILOSEC Take 20 mg by mouth daily.   oxyCODONE 5 MG immediate release tablet Commonly known as: Oxy IR/ROXICODONE Take 1 tablet (5 mg total) by mouth every 6 (six) hours as needed for up to 5 days for breakthrough pain.   polyethylene glycol 17 g packet Commonly known as: MiraLax Take 17 g by mouth daily. What changed:  when to take this reasons to take this   tiZANidine 4 MG tablet Commonly known as: Zanaflex Take 1 tablet (4 mg total) by mouth every 8 (eight) hours as needed for muscle spasms.   Xiidra 5 % Soln Generic drug: Lifitegrast Apply 1 drop to eye 2 (two) times daily.        Diagnostic Studies: No results found.  Disposition: Discharge disposition: 01-Home or Self Care       Discharge Instructions     Discharge patient   Complete  by: As directed    DC after Dr. Ernestene Kiel sees patient at 1700   Discharge disposition: 01-Home or Self Care   Discharge patient date: 02/21/2023        Follow-up Information     Scarlette Ar, MD. Go on 03/01/2023.   Specialty: Otolaryngology Why: Follow up appointment at 3PM on 03/01/23. Stop by office anytime on 02/26/23 for drain removal. Contact information: [address] [place] [address] [phone]                  Signed: Scarlette Ar 02/21/2023, 5:01 PM

## 2023-02-21 NOTE — Plan of Care (Signed)
  Problem: Education: Goal: Knowledge of General Education information will improve Description: Including pain rating scale, medication(s)/side effects and non-pharmacologic comfort measures 02/21/2023 1601 by Annalee Genta, RN Outcome: Adequate for Discharge 02/21/2023 1535 by Annalee Genta, RN Outcome: Progressing   Problem: Clinical Measurements: Goal: Ability to maintain clinical measurements within normal limits will improve 02/21/2023 1601 by Annalee Genta, RN Outcome: Adequate for Discharge 02/21/2023 1535 by Annalee Genta, RN Outcome: Progressing Goal: Diagnostic test results will improve 02/21/2023 1601 by Annalee Genta, RN Outcome: Adequate for Discharge 02/21/2023 1535 by Annalee Genta, RN Outcome: Progressing Goal: Respiratory complications will improve 02/21/2023 1601 by Annalee Genta, RN Outcome: Adequate for Discharge 02/21/2023 1535 by Annalee Genta, RN Outcome: Progressing Goal: Cardiovascular complication will be avoided 02/21/2023 1601 by Annalee Genta, RN Outcome: Adequate for Discharge 02/21/2023 1535 by Annalee Genta, RN Outcome: Progressing   Problem: Nutrition: Goal: Adequate nutrition will be maintained 02/21/2023 1601 by Annalee Genta, RN Outcome: Adequate for Discharge 02/21/2023 1535 by Annalee Genta, RN Outcome: Progressing   Problem: Coping: Goal: Level of anxiety will decrease 02/21/2023 1601 by Annalee Genta, RN Outcome: Adequate for Discharge 02/21/2023 1535 by Annalee Genta, RN Outcome: Progressing   Problem: Pain Managment: Goal: General experience of comfort will improve 02/21/2023 1601 by Annalee Genta, RN Outcome: Adequate for Discharge 02/21/2023 1535 by Annalee Genta, RN Outcome: Progressing   Problem: Safety: Goal: Ability to remain free from injury will improve 02/21/2023 1601 by Annalee Genta, RN Outcome: Adequate for Discharge 02/21/2023 1535 by Annalee Genta, RN Outcome:  Progressing   Problem: Skin Integrity: Goal: Risk for impaired skin integrity will decrease 02/21/2023 1601 by Annalee Genta, RN Outcome: Adequate for Discharge 02/21/2023 1535 by Annalee Genta, RN Outcome: Progressing

## 2023-02-21 NOTE — Plan of Care (Signed)
Care plan ongoing Annalee Genta, RN  02/21/2023  3:35 PM

## 2023-02-21 NOTE — Progress Notes (Signed)
   02/21/23 1213  TOC Brief Assessment  Insurance and Status Reviewed  Patient has primary care physician Yes  Home environment has been reviewed yes daughter lives with her  Prior level of function: independent  Prior/Current Home Services No current home services  Social Determinants of Health Reivew SDOH reviewed no interventions necessary  Readmission risk has been reviewed Yes  Transition of care needs no transition of care needs at this time

## 2023-02-21 NOTE — Progress Notes (Signed)
Mobility Specialist: Progress Note   02/21/23 1529  Mobility  Activity Ambulated with assistance in hallway  Level of Assistance Contact guard assist, steadying assist  Assistive Device Front wheel walker  Distance Ambulated (ft) 150 ft  Activity Response Tolerated well  Mobility Referral Yes  $Mobility charge 1 Mobility  Mobility Specialist Start Time (ACUTE ONLY) 1352  Mobility Specialist Stop Time (ACUTE ONLY) 1407  Mobility Specialist Time Calculation (min) (ACUTE ONLY) 15 min    Pt was agreeable to mobility session - received in bed. SV for STS and transfers, CG for ambulation for safety. No c/o throughout. Returned to room without fault. Left in chair with all needs met, call bell in reach.   Maurene Capes Mobility Specialist Please contact via SecureChat or Rehab office at [phone]

## 2023-02-27 ENCOUNTER — Ambulatory Visit: Payer: 59 | Attending: Orthopaedic Surgery

## 2023-02-27 ENCOUNTER — Telehealth: Payer: Self-pay

## 2023-02-27 NOTE — Telephone Encounter (Signed)
PT called regarding missed appointment on 02/27/2023 but was unable to leave a voicemail due to mailbox being full.  This is her first no show but her last appointment schedule.  Eloy End , PT 02/27/23 2:20 PM

## 2023-02-28 LAB — SURGICAL PATHOLOGY

## 2023-03-14 ENCOUNTER — Other Ambulatory Visit: Payer: Self-pay | Admitting: Otolaryngology

## 2023-03-14 DIAGNOSIS — E041 Nontoxic single thyroid nodule: Secondary | ICD-10-CM

## 2023-03-25 ENCOUNTER — Other Ambulatory Visit: Payer: 59

## 2023-03-29 ENCOUNTER — Ambulatory Visit (INDEPENDENT_AMBULATORY_CARE_PROVIDER_SITE_OTHER): Payer: 59 | Admitting: Family Medicine

## 2023-03-29 ENCOUNTER — Other Ambulatory Visit: Payer: Self-pay

## 2023-03-29 ENCOUNTER — Encounter: Payer: Self-pay | Admitting: Family Medicine

## 2023-03-29 VITALS — BP 141/94 | HR 62 | Ht 69.0 in | Wt 189.8 lb

## 2023-03-29 DIAGNOSIS — Z7984 Long term (current) use of oral hypoglycemic drugs: Secondary | ICD-10-CM

## 2023-03-29 DIAGNOSIS — Z720 Tobacco use: Secondary | ICD-10-CM

## 2023-03-29 DIAGNOSIS — J309 Allergic rhinitis, unspecified: Secondary | ICD-10-CM

## 2023-03-29 DIAGNOSIS — Z23 Encounter for immunization: Secondary | ICD-10-CM | POA: Diagnosis not present

## 2023-03-29 DIAGNOSIS — I152 Hypertension secondary to endocrine disorders: Secondary | ICD-10-CM

## 2023-03-29 DIAGNOSIS — M5442 Lumbago with sciatica, left side: Secondary | ICD-10-CM

## 2023-03-29 DIAGNOSIS — D119 Benign neoplasm of major salivary gland, unspecified: Secondary | ICD-10-CM

## 2023-03-29 DIAGNOSIS — G8929 Other chronic pain: Secondary | ICD-10-CM

## 2023-03-29 DIAGNOSIS — E1159 Type 2 diabetes mellitus with other circulatory complications: Secondary | ICD-10-CM

## 2023-03-29 MED ORDER — SHINGRIX 50 MCG/0.5ML IM SUSR
INTRAMUSCULAR | 1 refills | Status: DC
Start: 2023-03-29 — End: 2023-04-01

## 2023-03-29 MED ORDER — AZELASTINE-FLUTICASONE 137-50 MCG/ACT NA SUSP
1.0000 | Freq: Two times a day (BID) | NASAL | 3 refills | Status: DC
Start: 2023-03-29 — End: 2023-08-27

## 2023-03-29 NOTE — Assessment & Plan Note (Signed)
Successful removal by ENT on 02/20/2023, no complication and patient healing well.

## 2023-03-29 NOTE — Assessment & Plan Note (Signed)
141/94, suboptimal control but patient reports increased pain today. Will re-evaluate at follow up

## 2023-03-29 NOTE — Progress Notes (Signed)
    SUBJECTIVE:   CHIEF COMPLAINT / HPI:   Sinus congestion Present over the past few days and feels it is getting worse. Doing Flonase 2 sprays daily without relief. Feels the mucus is thick and difficult to clear. Denies fever or change in appetite. Trying to use humidified air to help with mucus.   PERTINENT  PMH / PSH: HTN, T2DM, h/o CVA, depression, chronic back pain   OBJECTIVE:   BP (!) 141/94   Pulse 62   Ht 5\' 9"  (1.753 m)   Wt 189 lb 12.8 oz (86.1 kg)   SpO2 99%   BMI 28.03 kg/m   General: NAD, pleasant, able to participate in exam HEENT: Healing surgical incision along left ear extending down lateral neck Cardiac: RRR, no murmurs. Respiratory: CTAB, normal effort, No wheezes, rales or rhonchi Extremities: no edema or cyanosis. Skin: warm and dry, no rashes noted Neuro: alert, no obvious focal deficits Psych: Normal affect and mood  ASSESSMENT/PLAN:   Assessment & Plan Allergic rhinitis, unspecified seasonality, unspecified trigger Sinus pressure and congestion not relieved by Flonase, will switch to Dymista 1 spray in each nostril BID for symptom relief. Tobacco use Down to 3 cigarette/day, congratulated patient on progress. Declines additional support and set goal to reduce to 1-2 daily by next visit. -Annual low dose CT chest for lung cancer screening Encounter for immunization COVID vaccine today. Provided Rx for Shingles vaccine to obtain at pharmacy. ROI to obtain documentation of flu vaccine Hypertension associated with diabetes (HCC) 141/94, suboptimal control but patient reports increased pain today. Will re-evaluate at follow up Warthin's tumor Successful removal by ENT on 02/20/2023, no complication and patient healing well. Chronic bilateral low back pain with left-sided sciatica Continues to have debilitating pain limiting ADLs. Follows with [facility], Dr. Earney Navy. Obtained ROI today for updated care plan.       Dr. Elberta Fortis, DO [facility]

## 2023-03-29 NOTE — Assessment & Plan Note (Signed)
Down to 3 cigarette/day, congratulated patient on progress. Declines additional support and set goal to reduce to 1-2 daily by next visit. -Annual low dose CT chest for lung cancer screening

## 2023-03-29 NOTE — Patient Instructions (Signed)
It was wonderful to see you today! Thank you for choosing [facility].   Please bring ALL of your medications with you to every visit.   Today we talked about:  Please use the Dymista nasal spray instead of Flonase, you can use 2 sprays in each nostril daily. Congrats on smoking less! We can set the goal of smoking only 1-2 cigarette daily the next time I see you. I ordered a lung cancer screening. You will receive that information via MyChart when it is scheduled. Please take the Shingles vaccine to your pharmacy to receive it. I am going to get the pain management records from [facility] to see what vaccines and lab work they have done for you.  Please follow up in 3 months  Call the clinic at [phone] if your symptoms worsen or you have any concerns.  Please be sure to schedule follow up at the front desk before you leave today.   Elberta Fortis, DO Family Medicine

## 2023-03-29 NOTE — Assessment & Plan Note (Signed)
Continues to have debilitating pain limiting ADLs. Follows with [facility], Dr. Earney Navy. Obtained ROI today for updated care plan.

## 2023-03-31 ENCOUNTER — Inpatient Hospital Stay (HOSPITAL_COMMUNITY)
Admission: EM | Admit: 2023-03-31 | Discharge: 2023-04-04 | DRG: 392 | Disposition: A | Discharge status: Home / Self Care | Payer: 59 | Attending: Family Medicine | Admitting: Family Medicine

## 2023-03-31 ENCOUNTER — Encounter (HOSPITAL_COMMUNITY): Payer: Self-pay

## 2023-03-31 ENCOUNTER — Emergency Department (HOSPITAL_COMMUNITY): Payer: 59

## 2023-03-31 ENCOUNTER — Other Ambulatory Visit: Payer: Self-pay

## 2023-03-31 DIAGNOSIS — Z7982 Long term (current) use of aspirin: Secondary | ICD-10-CM

## 2023-03-31 DIAGNOSIS — Z8673 Personal history of transient ischemic attack (TIA), and cerebral infarction without residual deficits: Secondary | ICD-10-CM

## 2023-03-31 DIAGNOSIS — Z1152 Encounter for screening for COVID-19: Secondary | ICD-10-CM

## 2023-03-31 DIAGNOSIS — E860 Dehydration: Secondary | ICD-10-CM | POA: Diagnosis present

## 2023-03-31 DIAGNOSIS — K529 Noninfective gastroenteritis and colitis, unspecified: Secondary | ICD-10-CM | POA: Diagnosis present

## 2023-03-31 DIAGNOSIS — Z9842 Cataract extraction status, left eye: Secondary | ICD-10-CM

## 2023-03-31 DIAGNOSIS — E119 Type 2 diabetes mellitus without complications: Secondary | ICD-10-CM | POA: Diagnosis present

## 2023-03-31 DIAGNOSIS — Z9841 Cataract extraction status, right eye: Secondary | ICD-10-CM

## 2023-03-31 DIAGNOSIS — F32A Depression, unspecified: Secondary | ICD-10-CM | POA: Diagnosis present

## 2023-03-31 DIAGNOSIS — Z7984 Long term (current) use of oral hypoglycemic drugs: Secondary | ICD-10-CM

## 2023-03-31 DIAGNOSIS — E1159 Type 2 diabetes mellitus with other circulatory complications: Secondary | ICD-10-CM | POA: Diagnosis present

## 2023-03-31 DIAGNOSIS — Z720 Tobacco use: Secondary | ICD-10-CM | POA: Diagnosis present

## 2023-03-31 DIAGNOSIS — F320 Major depressive disorder, single episode, mild: Secondary | ICD-10-CM | POA: Diagnosis present

## 2023-03-31 DIAGNOSIS — K219 Gastro-esophageal reflux disease without esophagitis: Secondary | ICD-10-CM | POA: Diagnosis present

## 2023-03-31 DIAGNOSIS — A084 Viral intestinal infection, unspecified: Principal | ICD-10-CM

## 2023-03-31 DIAGNOSIS — E890 Postprocedural hypothyroidism: Secondary | ICD-10-CM | POA: Diagnosis present

## 2023-03-31 DIAGNOSIS — E876 Hypokalemia: Secondary | ICD-10-CM | POA: Diagnosis present

## 2023-03-31 DIAGNOSIS — N9489 Other specified conditions associated with female genital organs and menstrual cycle: Secondary | ICD-10-CM | POA: Insufficient documentation

## 2023-03-31 DIAGNOSIS — R1900 Intra-abdominal and pelvic swelling, mass and lump, unspecified site: Secondary | ICD-10-CM | POA: Diagnosis present

## 2023-03-31 DIAGNOSIS — Z79899 Other long term (current) drug therapy: Secondary | ICD-10-CM

## 2023-03-31 DIAGNOSIS — F17210 Nicotine dependence, cigarettes, uncomplicated: Secondary | ICD-10-CM | POA: Diagnosis present

## 2023-03-31 DIAGNOSIS — I152 Hypertension secondary to endocrine disorders: Secondary | ICD-10-CM | POA: Diagnosis present

## 2023-03-31 DIAGNOSIS — Z96612 Presence of left artificial shoulder joint: Secondary | ICD-10-CM | POA: Diagnosis present

## 2023-03-31 DIAGNOSIS — M545 Low back pain, unspecified: Secondary | ICD-10-CM | POA: Diagnosis present

## 2023-03-31 DIAGNOSIS — G8929 Other chronic pain: Secondary | ICD-10-CM | POA: Diagnosis present

## 2023-03-31 DIAGNOSIS — Z96653 Presence of artificial knee joint, bilateral: Secondary | ICD-10-CM | POA: Diagnosis present

## 2023-03-31 DIAGNOSIS — I10 Essential (primary) hypertension: Secondary | ICD-10-CM | POA: Diagnosis present

## 2023-03-31 DIAGNOSIS — F419 Anxiety disorder, unspecified: Secondary | ICD-10-CM | POA: Diagnosis present

## 2023-03-31 LAB — CBC WITH DIFFERENTIAL/PLATELET
Abs Immature Granulocytes: 0.03 10*3/uL (ref 0.00–0.07)
Basophils Absolute: 0 10*3/uL (ref 0.0–0.1)
Basophils Relative: 0 %
Eosinophils Absolute: 0 10*3/uL (ref 0.0–0.5)
Eosinophils Relative: 0 %
HCT: 44.2 % (ref 36.0–46.0)
Hemoglobin: 15 g/dL (ref 12.0–15.0)
Immature Granulocytes: 1 %
Lymphocytes Relative: 14 %
Lymphs Abs: 0.9 10*3/uL (ref 0.7–4.0)
MCH: 32.1 pg (ref 26.0–34.0)
MCHC: 33.9 g/dL (ref 30.0–36.0)
MCV: 94.6 fL (ref 80.0–100.0)
Monocytes Absolute: 0.3 10*3/uL (ref 0.1–1.0)
Monocytes Relative: 5 %
Neutro Abs: 4.9 10*3/uL (ref 1.7–7.7)
Neutrophils Relative %: 80 %
Platelets: 239 10*3/uL (ref 150–400)
RBC: 4.67 MIL/uL (ref 3.87–5.11)
RDW: 11.9 % (ref 11.5–15.5)
WBC: 6.1 10*3/uL (ref 4.0–10.5)
nRBC: 0 % (ref 0.0–0.2)

## 2023-03-31 LAB — COMPREHENSIVE METABOLIC PANEL
ALT: 19 U/L (ref 0–44)
AST: 19 U/L (ref 15–41)
Albumin: 3.8 g/dL (ref 3.5–5.0)
Alkaline Phosphatase: 52 U/L (ref 38–126)
Anion gap: 12 (ref 5–15)
BUN: 9 mg/dL (ref 8–23)
CO2: 25 mmol/L (ref 22–32)
Calcium: 9.2 mg/dL (ref 8.9–10.3)
Chloride: 102 mmol/L (ref 98–111)
Creatinine, Ser: 0.86 mg/dL (ref 0.44–1.00)
GFR, Estimated: >60 mL/min (ref >60)
Glucose, Bld: 190 mg/dL — ABNORMAL HIGH (ref 70–99)
Potassium: 3.8 mmol/L (ref 3.5–5.1)
Sodium: 139 mmol/L (ref 135–145)
Total Bilirubin: 0.8 mg/dL (ref 0.3–1.2)
Total Protein: 6.9 g/dL (ref 6.5–8.1)

## 2023-03-31 LAB — SARS CORONAVIRUS 2 BY RT PCR: SARS Coronavirus 2 by RT PCR: NEGATIVE

## 2023-03-31 LAB — LIPASE, BLOOD: Lipase: 20 U/L (ref 11–51)

## 2023-03-31 MED ORDER — ONDANSETRON 4 MG PO TBDP: 4.0000 mg | ORAL_TABLET | Freq: Once | ORAL | Status: DC | PRN

## 2023-03-31 MED ORDER — PROMETHAZINE (PHENERGAN) 6.25MG IN NS 50ML IVPB
6.2500 mg | Freq: Once | INTRAVENOUS | Status: AC
  Administered 2023-04-01: 6.25 mg via INTRAVENOUS
  Filled 2023-03-31: qty 6.25

## 2023-03-31 MED ORDER — ONDANSETRON HCL 4 MG/2ML IJ SOLN
4.0000 mg | Freq: Four times a day (QID) | INTRAMUSCULAR | Status: DC | PRN
  Administered 2023-04-01 – 2023-04-03 (×4): 4 mg via INTRAVENOUS
  Filled 2023-03-31 (×4): qty 2

## 2023-03-31 MED ORDER — ENOXAPARIN SODIUM 40 MG/0.4ML IJ SOSY
40.0000 mg | PREFILLED_SYRINGE | Freq: Every day | INTRAMUSCULAR | Status: DC
  Administered 2023-04-03: 40 mg via SUBCUTANEOUS
  Filled 2023-03-31: qty 0.4

## 2023-03-31 MED ORDER — ONDANSETRON HCL 4 MG PO TABS
4.0000 mg | ORAL_TABLET | Freq: Four times a day (QID) | ORAL | Status: DC | PRN
  Administered 2023-04-01 – 2023-04-03 (×2): 4 mg via ORAL
  Filled 2023-03-31 (×2): qty 1

## 2023-03-31 MED ORDER — SODIUM CHLORIDE 0.9 % IV BOLUS
1000.0000 mL | Freq: Once | INTRAVENOUS | Status: AC
  Administered 2023-03-31: 1000 mL via INTRAVENOUS

## 2023-03-31 MED ORDER — DEXTROSE IN LACTATED RINGERS 5 % IV SOLN: INTRAVENOUS | Status: DC

## 2023-03-31 MED ORDER — ONDANSETRON HCL 4 MG/2ML IJ SOLN
4.0000 mg | Freq: Once | INTRAMUSCULAR | Status: AC
  Administered 2023-03-31: 4 mg via INTRAVENOUS
  Filled 2023-03-31: qty 2

## 2023-03-31 MED ORDER — MORPHINE SULFATE (PF) 2 MG/ML IV SOLN
2.0000 mg | INTRAVENOUS | Status: DC | PRN
  Administered 2023-04-01 – 2023-04-02 (×2): 2 mg via INTRAVENOUS
  Filled 2023-03-31 (×2): qty 1

## 2023-03-31 MED ORDER — IOHEXOL 350 MG/ML SOLN
75.0000 mL | Freq: Once | INTRAVENOUS | Status: AC | PRN
  Administered 2023-03-31: 75 mL via INTRAVENOUS

## 2023-03-31 MED ORDER — DICYCLOMINE HCL 10 MG/ML IM SOLN
20.0000 mg | Freq: Once | INTRAMUSCULAR | Status: AC
  Administered 2023-03-31: 20 mg via INTRAMUSCULAR
  Filled 2023-03-31: qty 2

## 2023-03-31 NOTE — ED Provider Notes (Signed)
[place] EMERGENCY DEPARTMENT AT [facility] Provider Note   CSN: [id number] Arrival date & time: 03/31/23  1840     History  Chief Complaint  Patient presents with   Emesis    Monica Watson is a 67 y.o. female presenting to the ED with 24 hours of fatigue, nausea, vomiting, diarrhea.  Patient reports she had a flu vaccine at her PCPs office 2 days ago.  She said yesterday evening she began to feel fatigued, tired, reports persistent vomiting and loose watery diarrhea.  No reported fevers at home.  She says she has a mild intermittent headache.  No sick contacts in the house.  No exposure to potentially undercooked meats or foods or fast food.  She had noodles for dinner yesterday.  HPI     Home Medications Prior to Admission medications   Medication Sig Start Date End Date Taking? Authorizing Provider  aspirin EC 81 MG tablet Take 1 tablet (81 mg total) by mouth daily. Swallow whole. 01/09/23   Elberta Fortis, MD  Azelastine-Fluticasone [phone] MCG/ACT SUSP Place 1 spray into the nose 2 (two) times daily. 03/29/23   Elberta Fortis, MD  calcium-vitamin D (OSCAL WITH D) 500-5 MG-MCG tablet Take 1 tablet by mouth daily with breakfast. 07/12/22   Sabino Dick, DO  ciclopirox (PENLAC) 8 % solution APPLY 1 COAT TO TOENAIL DAILY, REMOVE WEEKLY WITH POLISH REMOVER Patient taking differently: APPLY 1 COAT TO TOENAIL WEEKLY, REMOVE WEEKLY WITH POLISH REMOVER 04/20/22   Galaway, Lise Auer, DPM  clonazePAM (KLONOPIN) 0.25 MG disintegrating tablet Take 0.25 mg by mouth 2 (two) times daily as needed (restless legs). 04/08/22   [provider]  gabapentin (NEURONTIN) 800 MG tablet Take 1 tablet (800 mg total) by mouth at bedtime. Patient taking differently: Take 800 mg by mouth 2 (two) times daily. 12/17/20   Kathryne Hitch, MD  losartan (COZAAR) 50 MG tablet Take 1 tablet (50 mg total) by mouth daily. 01/09/23   Elberta Fortis, MD  Menthol, Topical  Analgesic, (ICY HOT EX) Apply 1 Application topically daily as needed (knee pain).    [provider]  metFORMIN (GLUCOPHAGE-XR) 500 MG 24 hr tablet Take one tablet daily for a week. If you do well with that, increase to one tablet in the morning and one in the evening. Patient taking differently: Take 500 mg by mouth every other day. 03/20/22   Sabino Dick, DO  naloxone (NARCAN) nasal spray 4 mg/0.1 mL Place 0.4 mg into the nose daily as needed (opioid overdose). 11/19/22   [provider]  omeprazole (PRILOSEC) 20 MG capsule Take 20 mg by mouth daily.    [provider]  polyethylene glycol (MIRALAX) 17 g packet Take 17 g by mouth daily. Patient taking differently: Take 17 g by mouth daily as needed for moderate constipation. 10/21/20   Cresenzo, Cyndi Lennert, MD  psyllium (METAMUCIL) 28 % packet Take 1 packet by mouth 2 (two) times daily. Patient taking differently: Take 1 packet by mouth daily as needed (constipation). 01/11/22   Maury Dus, MD  tiZANidine (ZANAFLEX) 4 MG tablet Take 1 tablet (4 mg total) by mouth every 8 (eight) hours as needed for muscle spasms. 12/17/20   Kathryne Hitch, MD  XIIDRA 5 % SOLN Apply 1 drop to eye 2 (two) times daily. 02/01/23   [provider]  Zoster Vaccine Adjuvanted [facility]) injection Administer Shingrix vaccination now and repeat in two months 03/29/23   Elberta Fortis, MD  ferrous sulfate Maricela Bo)  325 (65 FE) MG tablet Take 1 tablet (325 mg total) by mouth daily. Patient not taking: No sig reported 06/09/20 12/16/20  Sabino Dick, DO      Allergies    Patient has no known allergies.    Review of Systems   Review of Systems  Physical Exam Updated Vital Signs BP (!) 132/95 (BP Location: Left Arm)   Pulse 96   Temp 99.2 F (37.3 C) (Oral)   Resp 20   Ht 5\' 9"  (1.753 m)   Wt 87 kg   SpO2 100%   BMI 28.32 kg/m  Physical Exam Constitutional:      General: She is not in acute distress.     Comments: Appears tired on exam  HENT:     Head: Normocephalic and atraumatic.  Eyes:     Conjunctiva/sclera: Conjunctivae normal.     Pupils: Pupils are equal, round, and reactive to light.  Cardiovascular:     Rate and Rhythm: Normal rate and regular rhythm.  Pulmonary:     Effort: Pulmonary effort is normal. No respiratory distress.  Abdominal:     General: There is no distension.     Tenderness: There is abdominal tenderness. There is no guarding or rebound.  Skin:    General: Skin is warm and dry.  Neurological:     General: No focal deficit present.     Mental Status: She is alert. Mental status is at baseline.  Psychiatric:        Mood and Affect: Mood normal.        Behavior: Behavior normal.     ED Results / Procedures / Treatments   Labs (all labs ordered are listed, but only abnormal results are displayed) Labs Reviewed  COMPREHENSIVE METABOLIC PANEL - Abnormal; Notable for the following components:      Result Value   Glucose, Bld 190 (*)    All other components within normal limits  SARS CORONAVIRUS 2 BY RT PCR  LIPASE, BLOOD  CBC WITH DIFFERENTIAL/PLATELET  URINALYSIS, ROUTINE W REFLEX MICROSCOPIC    EKG EKG Interpretation Date/Time:  Sunday March 31 2023 18:42:46 EDT Ventricular Rate:  77 PR Interval:  154 QRS Duration:  78 QT Interval:  354 QTC Calculation: 400 R Axis:   59  Text Interpretation: Normal sinus rhythm Cannot rule out Anterior infarct , age undetermined Abnormal ECG When compared with ECG of 11-Apr-2022 14:53, PREVIOUS ECG IS PRESENT No significant changes Confirmed by Alvester Chou [phone]) on 03/31/2023 7:17:03 PM  Radiology CT ABDOMEN PELVIS W CONTRAST  Result Date: 03/31/2023 CLINICAL DATA:  Diverticulitis, complication suspected EXAM: CT ABDOMEN AND PELVIS WITH CONTRAST TECHNIQUE: Multidetector CT imaging of the abdomen and pelvis was performed using the standard protocol following bolus administration of intravenous contrast.  RADIATION DOSE REDUCTION: This exam was performed according to the departmental dose-optimization program which includes automated exposure control, adjustment of the mA and/or kV according to patient size and/or use of iterative reconstruction technique. CONTRAST:  75mL OMNIPAQUE IOHEXOL 350 MG/ML SOLN COMPARISON:  Pelvic ultrasound 02/14/2022 FINDINGS: Lower chest: No acute abnormality.  Small hiatal hernia. Hepatobiliary: No focal liver abnormality. Status post cholecystectomy. No biliary dilatation. Pancreas: Diffusely atrophic. No focal lesion. Otherwise normal pancreatic contour. No surrounding inflammatory changes. No main pancreatic ductal dilatation. Spleen: Normal in size without focal abnormality. Adrenals/Urinary Tract: No adrenal nodule bilaterally. Bilateral kidneys enhance symmetrically. No hydronephrosis. No hydroureter. The urinary bladder is unremarkable. On delayed imaging, there is no urothelial wall thickening and there are no  filling defects in the opacified portions of the bilateral collecting systems or ureters. Stomach/Bowel: Stomach is within normal limits. No evidence of bowel wall thickening or dilatation. Single colonic diverticula of the sigmoid colon. Majority of the colon is under distended and collapsed. Appendix appears normal. Vascular/Lymphatic: No abdominal aorta or iliac aneurysm. Mild atherosclerotic plaque of the aorta and its branches. No abdominal, pelvic, or inguinal lymphadenopathy. Reproductive: Asymmetrically prominent right ovary/adnexal region with associated calcifications. Masslike hypodensity of the cervical region measuring 4.6 x 3 cm. Intramural calcified uterine fibroid. Left adnexal region is unremarkable. Other: No intraperitoneal free fluid. No intraperitoneal free gas. No organized fluid collection. Musculoskeletal: No abdominal wall hernia or abnormality. No suspicious lytic or blastic osseous lesions. No acute displaced fracture. IMPRESSION: 1.  Asymmetrically prominent right ovary/adnexal region with associated calcifications. Question underlying mass. Recommend pelvic ultrasound for further evaluation. 2. Masslike hypodensity of the cervical region measuring 4.6 x 3 cm. Finding can also be further evaluated on pelvic ultrasound. 3. Intramural uterine fibroid. 4. Small hiatal hernia. 5. Single diverticula of the sigmoid colon with no associated acute diverticulitis. Electronically Signed   By: Tish Frederickson M.D.   On: 03/31/2023 22:05    Procedures Procedures    Medications Ordered in ED Medications  promethazine (PHENERGAN) 6.25 mg/NS 50 mL IVPB (has no administration in time range)  sodium chloride 0.9 % bolus 1,000 mL (0 mLs Intravenous Stopped 03/31/23 2211)  dicyclomine (BENTYL) injection 20 mg (20 mg Intramuscular Given 03/31/23 2022)  ondansetron (ZOFRAN) injection 4 mg (4 mg Intravenous Given 03/31/23 2013)  iohexol (OMNIPAQUE) 350 MG/ML injection 75 mL (75 mLs Intravenous Contrast Given 03/31/23 2111)    ED Course/ Medical Decision Making/ A&P Clinical Course as of 03/31/23 2320  Sun Mar 31, 2023  2320 Admitted to Dr Mikeal Hawthorne hospitalist [MT]    Clinical Course User Index [MT] Terald Sleeper, MD                                 Medical Decision Making Amount and/or Complexity of Data Reviewed Labs: ordered. Radiology: ordered.  Risk Prescription drug management. Decision regarding hospitalization.   This patient presents to the ED with concern for nausea, vomiting, diarrhea, fatigue. This involves an extensive number of treatment options, and is a complaint that carries with it a high risk of complications and morbidity.  The differential diagnosis includes viral gastroenteritis versus diverticulitis versus colitis versus other  Difficult exclude the possibility of a vaccine side effect but less likely given the specific predominantly GI complaints.  No report of exposure to potential foodborne illness or  pathogens  Patient has a history of diabetes which puts her at higher risk for metabolic derangement  External records from outside source obtained and reviewed including hospital discharge summary from August for Ortho tumor removal.  I ordered and personally interpreted labs.  The pertinent results include: No emergent findings  I ordered imaging studies including CT abdomen pelvis with contrast I independently visualized and interpreted imaging which showed no emergent findings.  There is some thickening of the cervix and potential adnexal mass, as well as uterine fibroids.  The patient was made aware of these findings concerning for cancer and reports that she has been "worked up for that in the past".  I advised that she follow-up with her doctor for this issue and consider a pelvic ultrasound as an outpatient. I agree with the radiologist interpretation  I  ordered medication including IV fluids, Bentyl, Zofran  I have reviewed the patients home medicines and have made adjustments as needed  Test Considered: Low suspicion for mesenteric ischemia, no indication for CT angiogram imaging   After the interventions noted above, I reevaluated the patient and found that they have: stayed the same   Dispostion:  After consideration of the diagnostic results and the patients response to treatment, I feel that the patent would benefit from medical admission.  We did discuss the option of observation mission in the hospital for suspected viral gastroenteritis versus home management.  At this point the patient is unable to keep down fluids, feels extremely fatigued, difficulty even getting out of the stretcher, and prefers to stay in the hospital observation which would be reasonable.         Final Clinical Impression(s) / ED Diagnoses Final diagnoses:  Viral gastroenteritis    Rx / DC Orders ED Discharge Orders     None         Terald Sleeper, MD 03/31/23 2320

## 2023-03-31 NOTE — ED Triage Notes (Signed)
Pt BIBPTAR from home for N/V/D after getting flu shot on Friday.   12 RR 176/106 98% RA 84 HR CBG 208 Temp: 98.1 F

## 2023-03-31 NOTE — H&P (Signed)
History and Physical    Patient: Monica Watson WUJ:[id number] DOB: 1955-07-11 DOA: 03/31/2023 DOS: the patient was seen and examined on 03/31/2023 PCP: Elberta Fortis, MD  Patient coming from: Home  Chief Complaint:  Chief Complaint  Patient presents with   Emesis   HPI: Monica Watson is a 67 y.o. female with medical history significant of prior CVA, GERD, essential hypertension, diet-controlled type 2 diabetes, history of depression and anxiety who presented to the ER with nausea vomiting and diarrhea.  Symptoms were acute in the last 2 days.  Associated with overall weakness.  Patient reported receiving flu vaccine 2 days ago from the PCP office.  She then started having this issues with vomiting and loose stool yesterday no fever no sick contact.  In the ER patient was treated symptomatically.  CT showed no acute findings patient appears to have gastroenteritis probably viral related.  Initial set of vitals were stable.  Patient did not improve in the ER so she has been admitted for observation and symptom control.  Review of Systems: As mentioned in the history of present illness. All other systems reviewed and are negative. Past Medical History:  Diagnosis Date   Arthritis    Cerebrovascular disease    Depression    GERD (gastroesophageal reflux disease)    Headache    Migraines   Hypertension    Stroke [facility]) 2010   Left sided weakness   Type 2 diabetes mellitus (HCC)    Past Surgical History:  Procedure Laterality Date   bilateral knee replacements      CATARACT EXTRACTION, BILATERAL     FRACTURE SURGERY Right    arm as a child   gallstones removed     I & D KNEE WITH POLY EXCHANGE Right 12/16/2020   Procedure: POLY EXCHANGE RIGHT KNEE;  Surgeon: Kathryne Hitch, MD;  Location: [facility] ORS;  Service: Orthopedics;  Laterality: Right;   KNEE ARTHROSCOPY Bilateral    left carpal tunnel release      PAROTIDECTOMY Left 02/20/2023   Procedure: SUPERFICIAL PAROTIDECTOMY  WITH FACIAL NERVE MONITORING;  Surgeon: Scarlette Ar, MD;  Location: [facility];  Service: ENT;  Laterality: Left;   REVERSE SHOULDER ARTHROPLASTY Left 04/23/2022   Procedure: LEFT REVERSE SHOULDER ARTHROPLASTY;  Surgeon: Huel Cote, MD;  Location: [facility] OR;  Service: Orthopedics;  Laterality: Left;   SHOULDER ARTHROSCOPY Bilateral    THYROIDECTOMY     Social History:  reports that she has been smoking cigarettes. She has a 23 pack-year smoking history. She has been exposed to tobacco smoke. She has never used smokeless tobacco. She reports that she does not drink alcohol and does not use drugs.  No Known Allergies  Family History  Problem Relation Age of Onset   Colon cancer Neg Hx    Esophageal cancer Neg Hx    Stomach cancer Neg Hx     Prior to Admission medications   Medication Sig Start Date End Date Taking? Authorizing Provider  aspirin EC 81 MG tablet Take 1 tablet (81 mg total) by mouth daily. Swallow whole. 01/09/23   Elberta Fortis, MD  Azelastine-Fluticasone [phone] MCG/ACT SUSP Place 1 spray into the nose 2 (two) times daily. 03/29/23   Elberta Fortis, MD  calcium-vitamin D (OSCAL WITH D) 500-5 MG-MCG tablet Take 1 tablet by mouth daily with breakfast. 07/12/22   Sabino Dick, DO  ciclopirox (PENLAC) 8 % solution APPLY 1 COAT TO TOENAIL DAILY, REMOVE WEEKLY WITH POLISH REMOVER Patient taking differently: APPLY 1 COAT TO TOENAIL  WEEKLY, REMOVE WEEKLY WITH POLISH REMOVER 04/20/22   Galaway, Lise Auer, DPM  clonazePAM (KLONOPIN) 0.25 MG disintegrating tablet Take 0.25 mg by mouth 2 (two) times daily as needed (restless legs). 04/08/22   [provider]  gabapentin (NEURONTIN) 800 MG tablet Take 1 tablet (800 mg total) by mouth at bedtime. Patient taking differently: Take 800 mg by mouth 2 (two) times daily. 12/17/20   Kathryne Hitch, MD  losartan (COZAAR) 50 MG tablet Take 1 tablet (50 mg total) by mouth daily. 01/09/23   Elberta Fortis, MD  Menthol,  Topical Analgesic, (ICY HOT EX) Apply 1 Application topically daily as needed (knee pain).    [provider]  metFORMIN (GLUCOPHAGE-XR) 500 MG 24 hr tablet Take one tablet daily for a week. If you do well with that, increase to one tablet in the morning and one in the evening. Patient taking differently: Take 500 mg by mouth every other day. 03/20/22   Sabino Dick, DO  naloxone (NARCAN) nasal spray 4 mg/0.1 mL Place 0.4 mg into the nose daily as needed (opioid overdose). 11/19/22   [provider]  omeprazole (PRILOSEC) 20 MG capsule Take 20 mg by mouth daily.    [provider]  polyethylene glycol (MIRALAX) 17 g packet Take 17 g by mouth daily. Patient taking differently: Take 17 g by mouth daily as needed for moderate constipation. 10/21/20   Cresenzo, Cyndi Lennert, MD  psyllium (METAMUCIL) 28 % packet Take 1 packet by mouth 2 (two) times daily. Patient taking differently: Take 1 packet by mouth daily as needed (constipation). 01/11/22   Maury Dus, MD  tiZANidine (ZANAFLEX) 4 MG tablet Take 1 tablet (4 mg total) by mouth every 8 (eight) hours as needed for muscle spasms. 12/17/20   Kathryne Hitch, MD  XIIDRA 5 % SOLN Apply 1 drop to eye 2 (two) times daily. 02/01/23   [provider]  Zoster Vaccine Adjuvanted [facility]) injection Administer Shingrix vaccination now and repeat in two months 03/29/23   Elberta Fortis, MD  ferrous sulfate (FEROSUL) 325 (65 FE) MG tablet Take 1 tablet (325 mg total) by mouth daily. Patient not taking: No sig reported 06/09/20 12/16/20  Sabino Dick, DO    Physical Exam: Vitals:   03/31/23 1845 03/31/23 1846 03/31/23 2319  BP:  (!) 172/102 (!) 132/95  Pulse:  93 96  Resp:  20 20  Temp:  99.1 F (37.3 C) 99.2 F (37.3 C)  TempSrc:  Oral Oral  SpO2:  99% 100%  Weight: 87 kg    Height: 5\' 9"  (1.753 m)     Constitutional: NAD, calm, comfortable Eyes: PERRL, lids and conjunctivae normal ENMT: Mucous  membranes are moist. Posterior pharynx clear of any exudate or lesions.Normal dentition.  Neck: normal, supple, no masses, no thyromegaly Respiratory: clear to auscultation bilaterally, no wheezing, no crackles. Normal respiratory effort. No accessory muscle use.  Cardiovascular: Regular rate and rhythm, no murmurs / rubs / gallops. No extremity edema. 2+ pedal pulses. No carotid bruits.  Abdomen: no tenderness, no masses palpated. No hepatosplenomegaly. Bowel sounds positive.  Musculoskeletal: Good range of motion, no joint swelling or tenderness, Skin: no rashes, lesions, ulcers. No induration Neurologic: CN 2-12 grossly intact. Sensation intact, DTR normal. Strength 5/5 in all 4.  Psychiatric: Normal judgment and insight. Alert and oriented x 3. Normal mood  Data Reviewed:  Blood pressure 170/102, Glucose 190 the rest of the chemistry and CBC within normal.  Acute viral screen including COVID-19 negative.  Assessment and Plan:  #1 acute viral gastroenteritis: Supportive care only.  Continue IV fluids, treatment for nausea vomiting.  Once symptoms controlled patient will be discharged home on this regimen.  #2 history of CVA: Stable.  #3 essential hypertension: Will resume home regimen as soon as practicable  #4 depression: Continue with antidepressants.  #5 tobacco abuse: Continue to work states that she can stay    Advance Care Planning:   Code Status: Prior full code  Consults: None  Family Communication: No family at bedside  Severity of Illness: The appropriate patient status for this patient is OBSERVATION. Observation status is judged to be reasonable and necessary in order to provide the required intensity of service to ensure the patient's safety. The patient's presenting symptoms, physical exam findings, and initial radiographic and laboratory data in the context of their medical condition is felt to place them at decreased risk for further clinical deterioration.  Furthermore, it is anticipated that the patient will be medically stable for discharge from the hospital within 2 midnights of admission.   AuthorLonia Blood, MD 03/31/2023 11:22 PM  For on call review [email or web address].

## 2023-04-01 DIAGNOSIS — K219 Gastro-esophageal reflux disease without esophagitis: Secondary | ICD-10-CM | POA: Diagnosis present

## 2023-04-01 DIAGNOSIS — M545 Low back pain, unspecified: Secondary | ICD-10-CM | POA: Diagnosis present

## 2023-04-01 DIAGNOSIS — Z7982 Long term (current) use of aspirin: Secondary | ICD-10-CM | POA: Diagnosis not present

## 2023-04-01 DIAGNOSIS — F32A Depression, unspecified: Secondary | ICD-10-CM | POA: Diagnosis present

## 2023-04-01 DIAGNOSIS — I10 Essential (primary) hypertension: Secondary | ICD-10-CM | POA: Diagnosis present

## 2023-04-01 DIAGNOSIS — Z7984 Long term (current) use of oral hypoglycemic drugs: Secondary | ICD-10-CM | POA: Diagnosis not present

## 2023-04-01 DIAGNOSIS — K529 Noninfective gastroenteritis and colitis, unspecified: Secondary | ICD-10-CM | POA: Diagnosis present

## 2023-04-01 DIAGNOSIS — G8929 Other chronic pain: Secondary | ICD-10-CM | POA: Diagnosis present

## 2023-04-01 DIAGNOSIS — Z79899 Other long term (current) drug therapy: Secondary | ICD-10-CM | POA: Diagnosis not present

## 2023-04-01 DIAGNOSIS — E860 Dehydration: Secondary | ICD-10-CM | POA: Diagnosis present

## 2023-04-01 DIAGNOSIS — F419 Anxiety disorder, unspecified: Secondary | ICD-10-CM | POA: Diagnosis present

## 2023-04-01 DIAGNOSIS — R1900 Intra-abdominal and pelvic swelling, mass and lump, unspecified site: Secondary | ICD-10-CM | POA: Diagnosis present

## 2023-04-01 DIAGNOSIS — E890 Postprocedural hypothyroidism: Secondary | ICD-10-CM | POA: Diagnosis present

## 2023-04-01 DIAGNOSIS — A084 Viral intestinal infection, unspecified: Secondary | ICD-10-CM | POA: Diagnosis present

## 2023-04-01 DIAGNOSIS — N9489 Other specified conditions associated with female genital organs and menstrual cycle: Secondary | ICD-10-CM | POA: Diagnosis not present

## 2023-04-01 DIAGNOSIS — Z96653 Presence of artificial knee joint, bilateral: Secondary | ICD-10-CM | POA: Diagnosis present

## 2023-04-01 DIAGNOSIS — Z9842 Cataract extraction status, left eye: Secondary | ICD-10-CM | POA: Diagnosis not present

## 2023-04-01 DIAGNOSIS — Z96612 Presence of left artificial shoulder joint: Secondary | ICD-10-CM | POA: Diagnosis present

## 2023-04-01 DIAGNOSIS — Z9841 Cataract extraction status, right eye: Secondary | ICD-10-CM | POA: Diagnosis not present

## 2023-04-01 DIAGNOSIS — Z1152 Encounter for screening for COVID-19: Secondary | ICD-10-CM | POA: Diagnosis not present

## 2023-04-01 DIAGNOSIS — F17210 Nicotine dependence, cigarettes, uncomplicated: Secondary | ICD-10-CM | POA: Diagnosis present

## 2023-04-01 DIAGNOSIS — E876 Hypokalemia: Secondary | ICD-10-CM | POA: Diagnosis present

## 2023-04-01 DIAGNOSIS — Z8673 Personal history of transient ischemic attack (TIA), and cerebral infarction without residual deficits: Secondary | ICD-10-CM | POA: Diagnosis not present

## 2023-04-01 DIAGNOSIS — E119 Type 2 diabetes mellitus without complications: Secondary | ICD-10-CM | POA: Diagnosis present

## 2023-04-01 LAB — URINALYSIS, ROUTINE W REFLEX MICROSCOPIC
Bacteria, UA: NONE SEEN
Bilirubin Urine: NEGATIVE
Glucose, UA: 50 mg/dL — AB
Hgb urine dipstick: NEGATIVE
Ketones, ur: 80 mg/dL — AB
Leukocytes,Ua: NEGATIVE
Nitrite: NEGATIVE
Protein, ur: NEGATIVE mg/dL
Specific Gravity, Urine: >1.046 — ABNORMAL HIGH (ref 1.005–1.030)
pH: 7 (ref 5.0–8.0)

## 2023-04-01 LAB — COMPREHENSIVE METABOLIC PANEL
ALT: 16 U/L (ref 0–44)
AST: 13 U/L — ABNORMAL LOW (ref 15–41)
Albumin: 3.5 g/dL (ref 3.5–5.0)
Alkaline Phosphatase: 51 U/L (ref 38–126)
Anion gap: 12 (ref 5–15)
BUN: 9 mg/dL (ref 8–23)
CO2: 23 mmol/L (ref 22–32)
Calcium: 9 mg/dL (ref 8.9–10.3)
Chloride: 103 mmol/L (ref 98–111)
Creatinine, Ser: 0.65 mg/dL (ref 0.44–1.00)
GFR, Estimated: >60 mL/min (ref >60)
Glucose, Bld: 158 mg/dL — ABNORMAL HIGH (ref 70–99)
Potassium: 3.6 mmol/L (ref 3.5–5.1)
Sodium: 138 mmol/L (ref 135–145)
Total Bilirubin: 1.1 mg/dL (ref 0.3–1.2)
Total Protein: 6.3 g/dL — ABNORMAL LOW (ref 6.5–8.1)

## 2023-04-01 LAB — CBC
HCT: 43.4 % (ref 36.0–46.0)
HCT: 42.8 % (ref 36.0–46.0)
HCT: 42 % (ref 36.0–46.0)
Hemoglobin: 14.3 g/dL (ref 12.0–15.0)
Hemoglobin: 14.4 g/dL (ref 12.0–15.0)
Hemoglobin: 14.1 g/dL (ref 12.0–15.0)
MCH: 31.7 pg (ref 26.0–34.0)
MCH: 31.5 pg (ref 26.0–34.0)
MCH: 31.2 pg (ref 26.0–34.0)
MCHC: 32.9 g/dL (ref 30.0–36.0)
MCHC: 33.6 g/dL (ref 30.0–36.0)
MCHC: 33.6 g/dL (ref 30.0–36.0)
MCV: 96.2 fL (ref 80.0–100.0)
MCV: 93.7 fL (ref 80.0–100.0)
MCV: 92.9 fL (ref 80.0–100.0)
Platelets: 228 10*3/uL (ref 150–400)
Platelets: 226 10*3/uL (ref 150–400)
Platelets: 224 10*3/uL (ref 150–400)
RBC: 4.51 MIL/uL (ref 3.87–5.11)
RBC: 4.57 MIL/uL (ref 3.87–5.11)
RBC: 4.52 MIL/uL (ref 3.87–5.11)
RDW: 11.9 % (ref 11.5–15.5)
RDW: 11.9 % (ref 11.5–15.5)
RDW: 11.9 % (ref 11.5–15.5)
WBC: 7.1 10*3/uL (ref 4.0–10.5)
WBC: 10.3 10*3/uL (ref 4.0–10.5)
WBC: 9.6 10*3/uL (ref 4.0–10.5)
nRBC: 0 % (ref 0.0–0.2)
nRBC: 0 % (ref 0.0–0.2)
nRBC: 0 % (ref 0.0–0.2)

## 2023-04-01 LAB — CREATININE, SERUM
Creatinine, Ser: 0.73 mg/dL (ref 0.44–1.00)
GFR, Estimated: >60 mL/min (ref >60)

## 2023-04-01 LAB — GLUCOSE, CAPILLARY
Glucose-Capillary: 171 mg/dL — ABNORMAL HIGH (ref 70–99)
Glucose-Capillary: 211 mg/dL — ABNORMAL HIGH (ref 70–99)
Glucose-Capillary: 165 mg/dL — ABNORMAL HIGH (ref 70–99)

## 2023-04-01 MED ORDER — POLYETHYLENE GLYCOL 3350 17 G PO PACK
17.0000 g | PACK | Freq: Every day | ORAL | Status: DC | PRN
  Administered 2023-04-03: 17 g via ORAL
  Filled 2023-04-01: qty 1

## 2023-04-01 MED ORDER — METOPROLOL TARTRATE 5 MG/5ML IV SOLN: 5.0000 mg | INTRAVENOUS | Status: DC | PRN

## 2023-04-01 MED ORDER — DEXTROSE IN LACTATED RINGERS 5 % IV SOLN: INTRAVENOUS | Status: AC

## 2023-04-01 MED ORDER — PSYLLIUM 95 % PO PACK: 1.0000 | PACK | Freq: Every day | ORAL | Status: DC | PRN

## 2023-04-01 MED ORDER — LOSARTAN POTASSIUM 50 MG PO TABS
50.0000 mg | ORAL_TABLET | Freq: Every day | ORAL | Status: DC
  Administered 2023-04-01 – 2023-04-04 (×4): 50 mg via ORAL
  Filled 2023-04-01 (×4): qty 1

## 2023-04-01 MED ORDER — TIZANIDINE HCL 4 MG PO TABS: 4.0000 mg | ORAL_TABLET | Freq: Three times a day (TID) | ORAL | Status: DC | PRN

## 2023-04-01 MED ORDER — GUAIFENESIN 100 MG/5ML PO LIQD: 5.0000 mL | ORAL | Status: DC | PRN

## 2023-04-01 MED ORDER — OXYCODONE HCL 5 MG PO TABS
5.0000 mg | ORAL_TABLET | Freq: Four times a day (QID) | ORAL | Status: DC | PRN
  Administered 2023-04-01 – 2023-04-03 (×5): 5 mg via ORAL
  Filled 2023-04-01 (×5): qty 1

## 2023-04-01 MED ORDER — IPRATROPIUM-ALBUTEROL 0.5-2.5 (3) MG/3ML IN SOLN: 3.0000 mL | RESPIRATORY_TRACT | Status: DC | PRN

## 2023-04-01 MED ORDER — OXYCODONE-ACETAMINOPHEN 10-325 MG PO TABS: 1.0000 | ORAL_TABLET | Freq: Four times a day (QID) | ORAL | Status: DC | PRN

## 2023-04-01 MED ORDER — OXYCODONE-ACETAMINOPHEN 5-325 MG PO TABS
1.0000 | ORAL_TABLET | Freq: Four times a day (QID) | ORAL | Status: DC | PRN
  Administered 2023-04-01 – 2023-04-03 (×3): 1 via ORAL
  Filled 2023-04-01 (×3): qty 1

## 2023-04-01 MED ORDER — ASPIRIN 81 MG PO TBEC
81.0000 mg | DELAYED_RELEASE_TABLET | Freq: Every day | ORAL | Status: DC
  Administered 2023-04-01 – 2023-04-04 (×4): 81 mg via ORAL
  Filled 2023-04-01 (×4): qty 1

## 2023-04-01 MED ORDER — PANTOPRAZOLE SODIUM 40 MG PO TBEC
40.0000 mg | DELAYED_RELEASE_TABLET | Freq: Every day | ORAL | Status: DC
  Administered 2023-04-01 – 2023-04-02 (×2): 40 mg via ORAL
  Filled 2023-04-01 (×2): qty 1

## 2023-04-01 MED ORDER — GABAPENTIN 400 MG PO CAPS
800.0000 mg | ORAL_CAPSULE | Freq: Every day | ORAL | Status: DC
  Administered 2023-04-01 – 2023-04-03 (×3): 800 mg via ORAL
  Filled 2023-04-01 (×3): qty 2

## 2023-04-01 MED ORDER — SENNOSIDES-DOCUSATE SODIUM 8.6-50 MG PO TABS
1.0000 | ORAL_TABLET | Freq: Every evening | ORAL | Status: DC | PRN
  Administered 2023-04-02: 1 via ORAL
  Filled 2023-04-01: qty 1

## 2023-04-01 MED ORDER — CLONAZEPAM 0.125 MG PO TBDP: 0.2500 mg | ORAL_TABLET | Freq: Two times a day (BID) | ORAL | Status: DC | PRN

## 2023-04-01 MED ORDER — HYDRALAZINE HCL 20 MG/ML IJ SOLN: 10.0000 mg | INTRAMUSCULAR | Status: DC | PRN

## 2023-04-01 MED ORDER — PROCHLORPERAZINE EDISYLATE 10 MG/2ML IJ SOLN: 10.0000 mg | Freq: Four times a day (QID) | INTRAMUSCULAR | Status: DC | PRN

## 2023-04-01 MED ORDER — TRAZODONE HCL 50 MG PO TABS: 50.0000 mg | ORAL_TABLET | Freq: Every evening | ORAL | Status: DC | PRN

## 2023-04-01 NOTE — Hospital Course (Addendum)
Hospital course:  67 year old with history of prior CVA, GERD, HTN, diet-controlled DM 2, depression, anxiety admitted for nausea vomiting and diarrhea.  CT scan overall was unremarkable.  Likely suspicion for gastroenteritis.  Assessment and plan:  Acute viral gastroenteritis:  No acute findings on the CT scan.  Lipase and LFTs are negative.  Supportive care for now.  Antiemetics as needed.  Diet as tolerated.   History of CVA:  On home aspirin   Essential hypertension:  Resume home p.o. medication.  IV as needed   Depression:  Continue home medications   Tobacco abuse:  As needed nicotine if needed    DVT prophylaxis-Lovenox CODE STATUS-full Disposition-continue hospital stay until abdominal symptoms improved

## 2023-04-01 NOTE — ED Notes (Signed)
ED TO INPATIENT HANDOFF REPORT  ED Nurse Name and Phone #: Marcello Moores [phone]  S Name/Age/Gender Monica Watson 67 y.o. female Room/Bed: H019C/H019C  Code Status   Code Status: Full Code  Home/SNF/Other Home Patient oriented to: self, place, time, and situation Is this baseline? Yes   Triage Complete: Triage complete  Chief Complaint Acute gastroenteritis [K52.9]  Triage Note Pt BIBPTAR from home for N/V/D after getting flu shot on Friday.   12 RR 176/106 98% RA 84 HR CBG 208 Temp: 98.1 F    Allergies No Known Allergies  Level of Care/Admitting Diagnosis ED Disposition     ED Disposition  Admit   Condition  --   Comment  Hospital Area: [facility] [[id number]]  Level of Care: Med-Surg [16]  May place patient in observation at [facility] or [facility] if equivalent level of care is available:: No  Covid Evaluation: Asymptomatic - no recent exposure (last 10 days) testing not required  Diagnosis: Acute gastroenteritis [324401]  Admitting Physician: Rometta Emery [[id number]]  Attending Physician: Rometta Emery [[id number]]          B Medical/Surgery History Past Medical History:  Diagnosis Date   Arthritis    Cerebrovascular disease    Depression    GERD (gastroesophageal reflux disease)    Headache    Migraines   Hypertension    Stroke (HCC) 2010   Left sided weakness   Type 2 diabetes mellitus (HCC)    Past Surgical History:  Procedure Laterality Date   bilateral knee replacements      CATARACT EXTRACTION, BILATERAL     FRACTURE SURGERY Right    arm as a child   gallstones removed     I & D KNEE WITH POLY EXCHANGE Right 12/16/2020   Procedure: POLY EXCHANGE RIGHT KNEE;  Surgeon: Kathryne Hitch, MD;  Location: [facility] ORS;  Service: Orthopedics;  Laterality: Right;   KNEE ARTHROSCOPY Bilateral    left carpal tunnel release      PAROTIDECTOMY Left 02/20/2023   Procedure: SUPERFICIAL PAROTIDECTOMY WITH FACIAL NERVE MONITORING;   Surgeon: Scarlette Ar, MD;  Location: [facility] OR;  Service: ENT;  Laterality: Left;   REVERSE SHOULDER ARTHROPLASTY Left 04/23/2022   Procedure: LEFT REVERSE SHOULDER ARTHROPLASTY;  Surgeon: Huel Cote, MD;  Location: [facility] OR;  Service: Orthopedics;  Laterality: Left;   SHOULDER ARTHROSCOPY Bilateral    THYROIDECTOMY       A IV Location/Drains/Wounds Patient Lines/Drains/Airways Status     Active Line/Drains/Airways     Name Placement date Placement time Site Days   Peripheral IV 04/01/23 22 G Left;Posterior Forearm 04/01/23  0056  Forearm  less than 1   Closed System Drain 1 Left Neck Bulb (JP) 15 Fr. 02/20/23  0940  Neck  40            Intake/Output Last 24 hours  Intake/Output Summary (Last 24 hours) at 04/01/2023 0709 Last data filed at 04/01/2023 0153 Gross per 24 hour  Intake 1050 ml  Output --  Net 1050 ml    Labs/Imaging Results for orders placed or performed during the hospital encounter of 03/31/23 (from the past 48 hour(s))  Lipase, blood     Status: None   Collection Time: 03/31/23  6:48 PM  Result Value Ref Range   Lipase 20 11 - 51 U/L    Comment: Performed at [facility], [address] [place], [place] [address]  Comprehensive metabolic panel     Status:  Abnormal   Collection Time: 03/31/23  6:48 PM  Result Value Ref Range   Sodium 139 135 - 145 mmol/L   Potassium 3.8 3.5 - 5.1 mmol/L   Chloride 102 98 - 111 mmol/L   CO2 25 22 - 32 mmol/L   Glucose, Bld 190 (H) 70 - 99 mg/dL    Comment: Glucose reference range applies only to samples taken after fasting for at least 8 hours.   BUN 9 8 - 23 mg/dL   Creatinine, Ser 1.32 0.44 - 1.00 mg/dL   Calcium 9.2 8.9 - 44.0 mg/dL   Total Protein 6.9 6.5 - 8.1 g/dL   Albumin 3.8 3.5 - 5.0 g/dL   AST 19 15 - 41 U/L   ALT 19 0 - 44 U/L   Alkaline Phosphatase 52 38 - 126 U/L   Total Bilirubin 0.8 0.3 - 1.2 mg/dL   GFR, Estimated >10 >27 mL/min    Comment: (NOTE) Calculated using the CKD-EPI Creatinine  Equation (2021)    Anion gap 12 5 - 15    Comment: Performed at [facility], [address], [place], [place] [address]  CBC with Differential     Status: None   Collection Time: 03/31/23  6:48 PM  Result Value Ref Range   WBC 6.1 4.0 - 10.5 K/uL   RBC 4.67 3.87 - 5.11 MIL/uL   Hemoglobin 15.0 12.0 - 15.0 g/dL   HCT 44.0 34.7 - 42.5 %   MCV 94.6 80.0 - 100.0 fL   MCH 32.1 26.0 - 34.0 pg   MCHC 33.9 30.0 - 36.0 g/dL   RDW 95.6 38.7 - 56.4 %   Platelets 239 150 - 400 K/uL   nRBC 0.0 0.0 - 0.2 %   Neutrophils Relative % 80 %   Neutro Abs 4.9 1.7 - 7.7 K/uL   Lymphocytes Relative 14 %   Lymphs Abs 0.9 0.7 - 4.0 K/uL   Monocytes Relative 5 %   Monocytes Absolute 0.3 0.1 - 1.0 K/uL   Eosinophils Relative 0 %   Eosinophils Absolute 0.0 0.0 - 0.5 K/uL   Basophils Relative 0 %   Basophils Absolute 0.0 0.0 - 0.1 K/uL   Immature Granulocytes 1 %   Abs Immature Granulocytes 0.03 0.00 - 0.07 K/uL    Comment: Performed at [facility], [address] [place], [place] [address]  SARS Coronavirus 2 by RT PCR (hospital order, performed in [facility] hospital lab) *cepheid single result test* Anterior Nasal Swab     Status: None   Collection Time: 03/31/23  9:29 PM   Specimen: Anterior Nasal Swab  Result Value Ref Range   SARS Coronavirus 2 by RT PCR NEGATIVE NEGATIVE    Comment: Performed at [facility], [address], [place], [place] [address]  Urinalysis, Routine w reflex microscopic -Urine, Clean Catch     Status: Abnormal   Collection Time: 04/01/23  1:09 AM  Result Value Ref Range   Color, Urine YELLOW YELLOW   APPearance CLEAR CLEAR   Specific Gravity, Urine >1.046 (H) 1.005 - 1.030   pH 7.0 5.0 - 8.0   Glucose, UA 50 (A) NEGATIVE mg/dL   Hgb urine dipstick NEGATIVE NEGATIVE   Bilirubin Urine NEGATIVE NEGATIVE   Ketones, ur 80 (A) NEGATIVE mg/dL   Protein, ur NEGATIVE NEGATIVE mg/dL   Nitrite NEGATIVE NEGATIVE   Leukocytes,Ua NEGATIVE NEGATIVE   RBC /  HPF 0-5 0 - 5 RBC/hpf   WBC, UA  0-5 0 - 5 WBC/hpf   Bacteria, UA NONE SEEN NONE SEEN   Squamous Epithelial / HPF 0-5 0 - 5 /HPF   Mucus PRESENT     Comment: Performed at [facility], [address] [place], [place] [address]  CBC     Status: None   Collection Time: 04/01/23  1:09 AM  Result Value Ref Range   WBC 7.1 4.0 - 10.5 K/uL   RBC 4.51 3.87 - 5.11 MIL/uL   Hemoglobin 14.3 12.0 - 15.0 g/dL   HCT 09.8 11.9 - 14.7 %   MCV 96.2 80.0 - 100.0 fL   MCH 31.7 26.0 - 34.0 pg   MCHC 32.9 30.0 - 36.0 g/dL   RDW 82.9 56.2 - 13.0 %   Platelets 228 150 - 400 K/uL   nRBC 0.0 0.0 - 0.2 %    Comment: Performed at [facility], [address], [place], [place] [address]  Creatinine, serum     Status: None   Collection Time: 04/01/23  1:09 AM  Result Value Ref Range   Creatinine, Ser 0.73 0.44 - 1.00 mg/dL   GFR, Estimated >46 >96 mL/min    Comment: (NOTE) Calculated using the CKD-EPI Creatinine Equation (2021) Performed at [facility], [address] [place], [place] [address]   CBC     Status: None   Collection Time: 04/01/23  3:29 AM  Result Value Ref Range   WBC 10.3 4.0 - 10.5 K/uL   RBC 4.57 3.87 - 5.11 MIL/uL   Hemoglobin 14.4 12.0 - 15.0 g/dL   HCT 41.3 24.4 - 01.0 %   MCV 93.7 80.0 - 100.0 fL   MCH 31.5 26.0 - 34.0 pg   MCHC 33.6 30.0 - 36.0 g/dL   RDW 27.2 53.6 - 64.4 %   Platelets 226 150 - 400 K/uL   nRBC 0.0 0.0 - 0.2 %    Comment: Performed at [facility], [address], [place], [place] [address]  Comprehensive metabolic panel     Status: Abnormal   Collection Time: 04/01/23  3:29 AM  Result Value Ref Range   Sodium 138 135 - 145 mmol/L   Potassium 3.6 3.5 - 5.1 mmol/L   Chloride 103 98 - 111 mmol/L   CO2 23 22 - 32 mmol/L   Glucose, Bld 158 (H) 70 - 99 mg/dL    Comment: Glucose reference range applies only to samples taken after fasting for at least 8 hours.   BUN 9 8 - 23 mg/dL   Creatinine, Ser 2.59 0.44 - 1.00 mg/dL   Calcium  9.0 8.9 - 56.3 mg/dL   Total Protein 6.3 (L) 6.5 - 8.1 g/dL   Albumin 3.5 3.5 - 5.0 g/dL   AST 13 (L) 15 - 41 U/L   ALT 16 0 - 44 U/L   Alkaline Phosphatase 51 38 - 126 U/L   Total Bilirubin 1.1 0.3 - 1.2 mg/dL   GFR, Estimated >87 >56 mL/min    Comment: (NOTE) Calculated using the CKD-EPI Creatinine Equation (2021)    Anion gap 12 5 - 15    Comment: Performed at [facility], [address], [place], [place] [address]   CT ABDOMEN PELVIS W CONTRAST  Result Date: 03/31/2023 CLINICAL DATA:  Diverticulitis, complication suspected EXAM: CT ABDOMEN AND PELVIS WITH CONTRAST TECHNIQUE: Multidetector CT imaging of the abdomen and pelvis was performed using the standard protocol following bolus administration of intravenous contrast. RADIATION  DOSE REDUCTION: This exam was performed according to the departmental dose-optimization program which includes automated exposure control, adjustment of the mA and/or kV according to patient size and/or use of iterative reconstruction technique. CONTRAST:  75mL OMNIPAQUE IOHEXOL 350 MG/ML SOLN COMPARISON:  Pelvic ultrasound 02/14/2022 FINDINGS: Lower chest: No acute abnormality.  Small hiatal hernia. Hepatobiliary: No focal liver abnormality. Status post cholecystectomy. No biliary dilatation. Pancreas: Diffusely atrophic. No focal lesion. Otherwise normal pancreatic contour. No surrounding inflammatory changes. No main pancreatic ductal dilatation. Spleen: Normal in size without focal abnormality. Adrenals/Urinary Tract: No adrenal nodule bilaterally. Bilateral kidneys enhance symmetrically. No hydronephrosis. No hydroureter. The urinary bladder is unremarkable. On delayed imaging, there is no urothelial wall thickening and there are no filling defects in the opacified portions of the bilateral collecting systems or ureters. Stomach/Bowel: Stomach is within normal limits. No evidence of bowel wall thickening or dilatation. Single colonic diverticula of the  sigmoid colon. Majority of the colon is under distended and collapsed. Appendix appears normal. Vascular/Lymphatic: No abdominal aorta or iliac aneurysm. Mild atherosclerotic plaque of the aorta and its branches. No abdominal, pelvic, or inguinal lymphadenopathy. Reproductive: Asymmetrically prominent right ovary/adnexal region with associated calcifications. Masslike hypodensity of the cervical region measuring 4.6 x 3 cm. Intramural calcified uterine fibroid. Left adnexal region is unremarkable. Other: No intraperitoneal free fluid. No intraperitoneal free gas. No organized fluid collection. Musculoskeletal: No abdominal wall hernia or abnormality. No suspicious lytic or blastic osseous lesions. No acute displaced fracture. IMPRESSION: 1. Asymmetrically prominent right ovary/adnexal region with associated calcifications. Question underlying mass. Recommend pelvic ultrasound for further evaluation. 2. Masslike hypodensity of the cervical region measuring 4.6 x 3 cm. Finding can also be further evaluated on pelvic ultrasound. 3. Intramural uterine fibroid. 4. Small hiatal hernia. 5. Single diverticula of the sigmoid colon with no associated acute diverticulitis. Electronically Signed   By: Tish Frederickson M.D.   On: 03/31/2023 22:05    Pending Labs Unresulted Labs (From admission, onward)     Start     Ordered   04/07/23 0500  Creatinine, serum  (enoxaparin (LOVENOX)    CrCl >/= 30 ml/min)  Weekly,   R     Comments: while on enoxaparin therapy    03/31/23 2337            Vitals/Pain Today's Vitals   04/01/23 0223 04/01/23 0344 04/01/23 0503 04/01/23 0640  BP:  (!) 167/91    Pulse:  86    Resp:  20    Temp:  99.4 F (37.4 C)    TempSrc:  Oral    SpO2:  100%    Weight:      Height:      PainSc: Asleep 8  Asleep Asleep    Isolation Precautions No active isolations  Medications Medications  enoxaparin (LOVENOX) injection 40 mg (has no administration in time range)  dextrose 5 % in  lactated ringers infusion ( Intravenous New Bag/Given 04/01/23 0345)  ondansetron (ZOFRAN) tablet 4 mg (has no administration in time range)    Or  ondansetron (ZOFRAN) injection 4 mg (has no administration in time range)  morphine (PF) 2 MG/ML injection 2 mg (2 mg Intravenous Given 04/01/23 0344)  sodium chloride 0.9 % bolus 1,000 mL (0 mLs Intravenous Stopped 03/31/23 2211)  dicyclomine (BENTYL) injection 20 mg (20 mg Intramuscular Given 03/31/23 2022)  ondansetron (ZOFRAN) injection 4 mg (4 mg Intravenous Given 03/31/23 2013)  iohexol (OMNIPAQUE) 350 MG/ML injection 75 mL (75 mLs Intravenous Contrast Given 03/31/23 2111)  promethazine (PHENERGAN) 6.25 mg/NS 50 mL IVPB (0 mg Intravenous Stopped 04/01/23 0153)    Mobility walks     Focused Assessments     R Recommendations: See Admitting Provider Note  Report given to:   Additional Notes:

## 2023-04-01 NOTE — Progress Notes (Signed)
PROGRESS NOTE    Monica Watson  KGM:[id number] DOB: 06-Jan-1956 DOA: 03/31/2023 PCP: Elberta Fortis, MD    Hospital course:  67 year old with history of prior CVA, GERD, HTN, diet-controlled DM 2, depression, anxiety admitted for nausea vomiting and diarrhea.  CT scan overall was unremarkable.  Likely suspicion for gastroenteritis.  Assessment and plan:  Acute viral gastroenteritis:  No acute findings on the CT scan.  Lipase and LFTs are negative.  Supportive care for now.  Antiemetics as needed.  Diet as tolerated.   History of CVA:  On home aspirin   Essential hypertension:  Resume home p.o. medication.  IV as needed   Depression:  Continue home medications   Tobacco abuse:  As needed nicotine if needed    DVT prophylaxis-Lovenox CODE STATUS-full Disposition-continue hospital stay until abdominal symptoms improved           Subjective: Still having nausea this morning with poor oral intake.  Tells me overall she feels slightly better compared to yesterday.  Generalized arthritic pain. She is denying diarrhea  Examination:  General exam: Appears calm and comfortable  Respiratory system: Clear to auscultation. Respiratory effort normal. Cardiovascular system: S1 & S2 heard, RRR. No JVD, murmurs, rubs, gallops or clicks. No pedal edema. Gastrointestinal system: Abdomen is nondistended, soft and nontender. No organomegaly or masses felt. Normal bowel sounds heard. Central nervous system: Alert and oriented. No focal neurological deficits. Extremities: Symmetric 5 x 5 power. Skin: No rashes, lesions or ulcers Psychiatry: Judgement and insight appear normal. Mood & affect appropriate.       Diet Orders (From admission, onward)     Start     Ordered   03/31/23 2338  Diet clear liquid Room service appropriate? Yes; Fluid consistency: Thin  Diet effective now       Question Answer Comment  Room service appropriate? Yes   Fluid consistency: Thin       03/31/23 2337            Objective: Vitals:   03/31/23 2319 04/01/23 0344 04/01/23 0723 04/01/23 0829  BP: (!) 132/95 (!) 167/91 (!) 165/97 (!) 153/99  Pulse: 96 86 71 93  Resp: 20 20 17 16   Temp: 99.2 F (37.3 C) 99.4 F (37.4 C) 99.1 F (37.3 C) 99.4 F (37.4 C)  TempSrc: Oral Oral  Oral  SpO2: 100% 100% 99% 95%  Weight:      Height:        Intake/Output Summary (Last 24 hours) at 04/01/2023 1259 Last data filed at 04/01/2023 0153 Gross per 24 hour  Intake 1050 ml  Output --  Net 1050 ml   Filed Weights   03/31/23 1845  Weight: 87 kg    Scheduled Meds:  aspirin EC  81 mg Oral Daily   enoxaparin (LOVENOX) injection  40 mg Subcutaneous Daily   gabapentin  800 mg Oral QHS   losartan  50 mg Oral Daily   pantoprazole  40 mg Oral Daily   Continuous Infusions:  dextrose 5% lactated ringers 100 mL/hr at 04/01/23 0345    Nutritional status     Body mass index is 28.32 kg/m.  Data Reviewed:   CBC: Recent Labs  Lab 03/31/23 1848 04/01/23 0109 04/01/23 0329  WBC 6.1 7.1 10.3  NEUTROABS 4.9  --   --   HGB 15.0 14.3 14.4  HCT 44.2 43.4 42.8  MCV 94.6 96.2 93.7  PLT 239 228 226   Basic Metabolic Panel: Recent Labs  Lab 03/31/23 1848 04/01/23  0109 04/01/23 0329  NA 139  --  138  K 3.8  --  3.6  CL 102  --  103  CO2 25  --  23  GLUCOSE 190*  --  158*  BUN 9  --  9  CREATININE 0.86 0.73 0.65  CALCIUM 9.2  --  9.0   GFR: Estimated Creatinine Clearance: 82.5 mL/min (by C-G formula based on SCr of 0.65 mg/dL). Liver Function Tests: Recent Labs  Lab 03/31/23 1848 04/01/23 0329  AST 19 13*  ALT 19 16  ALKPHOS 52 51  BILITOT 0.8 1.1  PROT 6.9 6.3*  ALBUMIN 3.8 3.5   Recent Labs  Lab 03/31/23 1848  LIPASE 20   No results for input(s): "AMMONIA" in the last 168 hours. Coagulation Profile: No results for input(s): "INR", "PROTIME" in the last 168 hours. Cardiac Enzymes: No results for input(s): "CKTOTAL", "CKMB", "CKMBINDEX", "TROPONINI"  in the last 168 hours. BNP (last 3 results) No results for input(s): "PROBNP" in the last 8760 hours. HbA1C: No results for input(s): "HGBA1C" in the last 72 hours. CBG: Recent Labs  Lab 04/01/23 0856  GLUCAP 171*   Lipid Profile: No results for input(s): "CHOL", "HDL", "LDLCALC", "TRIG", "CHOLHDL", "LDLDIRECT" in the last 72 hours. Thyroid Function Tests: No results for input(s): "TSH", "T4TOTAL", "FREET4", "T3FREE", "THYROIDAB" in the last 72 hours. Anemia Panel: No results for input(s): "VITAMINB12", "FOLATE", "FERRITIN", "TIBC", "IRON", "RETICCTPCT" in the last 72 hours. Sepsis Labs: No results for input(s): "PROCALCITON", "LATICACIDVEN" in the last 168 hours.  Recent Results (from the past 240 hour(s))  SARS Coronavirus 2 by RT PCR (hospital order, performed in [facility] hospital lab) *cepheid single result test* Anterior Nasal Swab     Status: None   Collection Time: 03/31/23  9:29 PM   Specimen: Anterior Nasal Swab  Result Value Ref Range Status   SARS Coronavirus 2 by RT PCR NEGATIVE NEGATIVE Final    Comment: Performed at [facility], [address] [place], [place] [address]         Radiology Studies: CT ABDOMEN PELVIS W CONTRAST  Result Date: 03/31/2023 CLINICAL DATA:  Diverticulitis, complication suspected EXAM: CT ABDOMEN AND PELVIS WITH CONTRAST TECHNIQUE: Multidetector CT imaging of the abdomen and pelvis was performed using the standard protocol following bolus administration of intravenous contrast. RADIATION DOSE REDUCTION: This exam was performed according to the departmental dose-optimization program which includes automated exposure control, adjustment of the mA and/or kV according to patient size and/or use of iterative reconstruction technique. CONTRAST:  75mL OMNIPAQUE IOHEXOL 350 MG/ML SOLN COMPARISON:  Pelvic ultrasound 02/14/2022 FINDINGS: Lower chest: No acute abnormality.  Small hiatal hernia. Hepatobiliary: No focal liver abnormality. Status  post cholecystectomy. No biliary dilatation. Pancreas: Diffusely atrophic. No focal lesion. Otherwise normal pancreatic contour. No surrounding inflammatory changes. No main pancreatic ductal dilatation. Spleen: Normal in size without focal abnormality. Adrenals/Urinary Tract: No adrenal nodule bilaterally. Bilateral kidneys enhance symmetrically. No hydronephrosis. No hydroureter. The urinary bladder is unremarkable. On delayed imaging, there is no urothelial wall thickening and there are no filling defects in the opacified portions of the bilateral collecting systems or ureters. Stomach/Bowel: Stomach is within normal limits. No evidence of bowel wall thickening or dilatation. Single colonic diverticula of the sigmoid colon. Majority of the colon is under distended and collapsed. Appendix appears normal. Vascular/Lymphatic: No abdominal aorta or iliac aneurysm. Mild atherosclerotic plaque of the aorta and its branches. No abdominal, pelvic, or inguinal lymphadenopathy. Reproductive: Asymmetrically prominent right ovary/adnexal region  with associated calcifications. Masslike hypodensity of the cervical region measuring 4.6 x 3 cm. Intramural calcified uterine fibroid. Left adnexal region is unremarkable. Other: No intraperitoneal free fluid. No intraperitoneal free gas. No organized fluid collection. Musculoskeletal: No abdominal wall hernia or abnormality. No suspicious lytic or blastic osseous lesions. No acute displaced fracture. IMPRESSION: 1. Asymmetrically prominent right ovary/adnexal region with associated calcifications. Question underlying mass. Recommend pelvic ultrasound for further evaluation. 2. Masslike hypodensity of the cervical region measuring 4.6 x 3 cm. Finding can also be further evaluated on pelvic ultrasound. 3. Intramural uterine fibroid. 4. Small hiatal hernia. 5. Single diverticula of the sigmoid colon with no associated acute diverticulitis. Electronically Signed   By: Tish Frederickson  M.D.   On: 03/31/2023 22:05           LOS: 0 days   Time spent= 35 mins    Miguel Rota, MD [facility]  If 7PM-7AM, please contact night-coverage  04/01/2023, 12:59 PM

## 2023-04-01 NOTE — ED Notes (Signed)
Patient resting quietly with even and equal respirations.

## 2023-04-02 ENCOUNTER — Inpatient Hospital Stay (HOSPITAL_COMMUNITY): Payer: 59

## 2023-04-02 DIAGNOSIS — K529 Noninfective gastroenteritis and colitis, unspecified: Secondary | ICD-10-CM | POA: Diagnosis not present

## 2023-04-02 LAB — BASIC METABOLIC PANEL
Anion gap: 9 (ref 5–15)
BUN: 6 mg/dL — ABNORMAL LOW (ref 8–23)
CO2: 25 mmol/L (ref 22–32)
Calcium: 8.8 mg/dL — ABNORMAL LOW (ref 8.9–10.3)
Chloride: 98 mmol/L (ref 98–111)
Creatinine, Ser: 0.7 mg/dL (ref 0.44–1.00)
GFR, Estimated: >60 mL/min (ref >60)
Glucose, Bld: 160 mg/dL — ABNORMAL HIGH (ref 70–99)
Potassium: 3.3 mmol/L — ABNORMAL LOW (ref 3.5–5.1)
Sodium: 132 mmol/L — ABNORMAL LOW (ref 135–145)

## 2023-04-02 LAB — HEMOGLOBIN A1C
Hgb A1c MFr Bld: 5.9 % — ABNORMAL HIGH (ref 4.8–5.6)
Mean Plasma Glucose: 122.63 mg/dL

## 2023-04-02 LAB — GLUCOSE, CAPILLARY
Glucose-Capillary: 146 mg/dL — ABNORMAL HIGH (ref 70–99)
Glucose-Capillary: 131 mg/dL — ABNORMAL HIGH (ref 70–99)
Glucose-Capillary: 110 mg/dL — ABNORMAL HIGH (ref 70–99)

## 2023-04-02 LAB — MAGNESIUM: Magnesium: 1.7 mg/dL (ref 1.7–2.4)

## 2023-04-02 MED ORDER — POTASSIUM CHLORIDE CRYS ER 20 MEQ PO TBCR
40.0000 meq | EXTENDED_RELEASE_TABLET | ORAL | Status: AC
  Administered 2023-04-02 (×2): 40 meq via ORAL
  Filled 2023-04-02 (×2): qty 2

## 2023-04-02 MED ORDER — SIMETHICONE 80 MG PO CHEW
80.0000 mg | CHEWABLE_TABLET | Freq: Four times a day (QID) | ORAL | Status: DC | PRN
  Administered 2023-04-02: 80 mg via ORAL
  Filled 2023-04-02: qty 1

## 2023-04-02 MED ORDER — INSULIN ASPART 100 UNIT/ML IJ SOLN: 0.0000 [IU] | Freq: Three times a day (TID) | INTRAMUSCULAR | Status: DC

## 2023-04-02 MED ORDER — INSULIN ASPART 100 UNIT/ML IJ SOLN: 0.0000 [IU] | Freq: Every day | INTRAMUSCULAR | Status: DC

## 2023-04-02 MED ORDER — PANTOPRAZOLE SODIUM 40 MG PO TBEC
40.0000 mg | DELAYED_RELEASE_TABLET | Freq: Every day | ORAL | Status: DC
  Administered 2023-04-03 – 2023-04-04 (×2): 40 mg via ORAL
  Filled 2023-04-02 (×2): qty 1

## 2023-04-02 MED ORDER — MAGNESIUM OXIDE -MG SUPPLEMENT 400 (240 MG) MG PO TABS
800.0000 mg | ORAL_TABLET | Freq: Once | ORAL | Status: AC
  Administered 2023-04-02: 800 mg via ORAL
  Filled 2023-04-02: qty 2

## 2023-04-02 NOTE — Progress Notes (Signed)
Subjective:   Monica Watson is a 67 y.o. female who presents for an Initial Medicare Annual Wellness Visit.  Visit Complete: {[phone]}  Patient Medicare AWV questionnaire was completed by the patient on ***; I have confirmed that all information answered by patient is correct and no changes since this date.        Objective:    There were no vitals filed for this visit. There is no height or weight on file to calculate BMI.     03/31/2023    6:47 PM 02/20/2023   12:40 PM 02/20/2023    6:13 AM 02/12/2023    1:16 PM 01/28/2023   11:44 AM 01/24/2023    2:54 PM 01/09/2023   10:28 AM  Advanced Directives  Does Patient Have a Medical Advance Directive? No No No No No No No  Would patient like information on creating a medical advance directive? No - Patient declined No - Patient declined No - Patient declined No - Patient declined No - Patient declined No - Patient declined No - Patient declined    Current Medications (verified) Facility-Administered Encounter Medications as of 04/02/2023  Medication   aspirin EC tablet 81 mg   clonazepam (KLONOPIN) disintegrating tablet 0.25 mg   enoxaparin (LOVENOX) injection 40 mg   gabapentin (NEURONTIN) capsule 800 mg   guaiFENesin (ROBITUSSIN) 100 MG/5ML liquid 5 mL   hydrALAZINE (APRESOLINE) injection 10 mg   insulin aspart (novoLOG) injection 0-5 Units   insulin aspart (novoLOG) injection 0-9 Units   ipratropium-albuterol (DUONEB) 0.5-2.5 (3) MG/3ML nebulizer solution 3 mL   losartan (COZAAR) tablet 50 mg   metoprolol tartrate (LOPRESSOR) injection 5 mg   morphine (PF) 2 MG/ML injection 2 mg   ondansetron (ZOFRAN) tablet 4 mg   Or   ondansetron (ZOFRAN) injection 4 mg   oxyCODONE-acetaminophen (PERCOCET/ROXICET) 5-325 MG per tablet 1 tablet   And   oxyCODONE (Oxy IR/ROXICODONE) immediate release tablet 5 mg   [START ON 04/03/2023] pantoprazole (PROTONIX) EC tablet 40 mg   polyethylene glycol (MIRALAX / GLYCOLAX) packet  17 g   [COMPLETED] potassium chloride SA (KLOR-CON M) CR tablet 40 mEq   prochlorperazine (COMPAZINE) injection 10 mg   psyllium (HYDROCIL/METAMUCIL) 1 packet   senna-docusate (Senokot-S) tablet 1 tablet   simethicone (MYLICON) chewable tablet 80 mg   tiZANidine (ZANAFLEX) tablet 4 mg   traZODone (DESYREL) tablet 50 mg   [DISCONTINUED] pantoprazole (PROTONIX) EC tablet 40 mg   Outpatient Encounter Medications as of 04/02/2023  Medication Sig   aspirin EC 81 MG tablet Take 1 tablet (81 mg total) by mouth daily. Swallow whole.   Azelastine-Fluticasone 137-50 MCG/ACT SUSP Place 1 spray into the nose 2 (two) times daily. (Patient taking differently: Place 1 spray into the nose 2 (two) times daily as needed (Sinuses).)   calcium-vitamin D (OSCAL WITH D) 500-5 MG-MCG tablet Take 1 tablet by mouth daily with breakfast.   ciclopirox (PENLAC) 8 % solution APPLY 1 COAT TO TOENAIL DAILY, REMOVE WEEKLY WITH POLISH REMOVER   clonazePAM (KLONOPIN) 0.25 MG disintegrating tablet Take 0.25 mg by mouth 2 (two) times daily as needed (restless legs).   gabapentin (NEURONTIN) 800 MG tablet Take 1 tablet (800 mg total) by mouth at bedtime.   losartan (COZAAR) 50 MG tablet Take 1 tablet (50 mg total) by mouth daily.   Menthol, Topical Analgesic, (ICY HOT EX) Apply 1 Application topically daily as needed (knee pain).   metFORMIN (GLUCOPHAGE-XR) 500 MG 24 hr tablet Take one tablet daily for  a week. If you do well with that, increase to one tablet in the morning and one in the evening. (Patient taking differently: Take 500 mg by mouth daily.)   naloxone (NARCAN) nasal spray 4 mg/0.1 mL Place 0.4 mg into the nose daily as needed (opioid overdose).   omeprazole (PRILOSEC) 20 MG capsule Take 20 mg by mouth daily.   oxyCODONE-acetaminophen (PERCOCET) 10-325 MG tablet Take 1 tablet by mouth every 6 (six) hours as needed.   polyethylene glycol (MIRALAX) 17 g packet Take 17 g by mouth daily. (Patient taking differently: Take  17 g by mouth daily as needed for moderate constipation.)   psyllium (METAMUCIL) 28 % packet Take 1 packet by mouth 2 (two) times daily. (Patient taking differently: Take 1 packet by mouth daily as needed (constipation).)   tiZANidine (ZANAFLEX) 4 MG tablet Take 1 tablet (4 mg total) by mouth every 8 (eight) hours as needed for muscle spasms. (Patient taking differently: Take 4 mg by mouth daily as needed for muscle spasms.)   XIIDRA 5 % SOLN Apply 1 drop to eye daily as needed (Dry eye).   [DISCONTINUED] ferrous sulfate (FEROSUL) 325 (65 FE) MG tablet Take 1 tablet (325 mg total) by mouth daily. (Patient not taking: No sig reported)    Allergies (verified) Patient has no known allergies.   History: Past Medical History:  Diagnosis Date   Arthritis    Cerebrovascular disease    Depression    GERD (gastroesophageal reflux disease)    Headache    Migraines   Hypertension    Stroke [facility]) 2010   Left sided weakness   Type 2 diabetes mellitus (HCC)    Past Surgical History:  Procedure Laterality Date   bilateral knee replacements      CATARACT EXTRACTION, BILATERAL     FRACTURE SURGERY Right    arm as a child   gallstones removed     I & D KNEE WITH POLY EXCHANGE Right 12/16/2020   Procedure: POLY EXCHANGE RIGHT KNEE;  Surgeon: Kathryne Hitch, MD;  Location: [facility] OR[facility];  Service: Orthopedics;  Laterality: Right;   KNEE ARTHROSCOPY Bilateral    left carpal tunnel release      PAROTIDECTOMY Left 02/20/2023   Procedure: SUPERFICIAL PAROTIDECTOMY WITH FACIAL NERVE MONITORING;  Surgeon: Scarlette Ar, MD;  Location: [facility];  Service: ENT;  Laterality: Left;   REVERSE SHOULDER ARTHROPLASTY Left 04/23/2022   Procedure: LEFT REVERSE SHOULDER ARTHROPLASTY;  Surgeon: Huel Cote, MD;  Location: [facility] OR;  Service: Orthopedics;  Laterality: Left;   SHOULDER ARTHROSCOPY Bilateral    THYROIDECTOMY     Family History  Problem Relation Age of Onset   Colon cancer Neg Hx    Esophageal  cancer Neg Hx    Stomach cancer Neg Hx    Social History   Socioeconomic History   Marital status: Single    Spouse name: Not on file   Number of children: 5   Years of education: Not on file   Highest education level: Not on file  Occupational History   Occupation: disability  Tobacco Use   Smoking status: Every Day    Current packs/day: 0.50    Average packs/day: 0.5 packs/day for 46.0 years (23.0 ttl pk-yrs)    Types: Cigarettes    Passive exposure: Current   Smokeless tobacco: Never  Vaping Use   Vaping status: Never Used  Substance and Sexual Activity   Alcohol use: Never   Drug use: Never   Sexual activity: Yes  Other Topics Concern   Not on file  Social History Narrative   Retired    Right handed   Social Determinants of Health   Financial Resource Strain: Not on file  Food Insecurity: No Food Insecurity (04/01/2023)   Hunger Vital Sign    Worried About Running Out of Food in the Last Year: Never true    Ran Out of Food in the Last Year: Never true  Transportation Needs: No Transportation Needs (04/01/2023)   PRAPARE - Administrator, Civil Service (Medical): No    Lack of Transportation (Non-Medical): No  Physical Activity: Not on file  Stress: Not on file  Social Connections: Not on file    Tobacco Counseling Ready to quit: Not Answered Counseling given: Not Answered   Clinical Intake:                        Activities of Daily Living    04/01/2023    1:00 PM 02/20/2023   12:42 PM  In your present state of health, do you have any difficulty performing the following activities:  Hearing? 0   Vision? 0   Difficulty concentrating or making decisions? 1   Doing errands, shopping? 0 0    Patient Care Team: Elberta Fortis, MD as PCP - General (Family Medicine)  Indicate any recent Medical Services you may have received from other than [facility] providers in the past year (date may be approximate).     Assessment:   This  is a routine wellness examination for Taralyn.  Hearing/Vision screen No results found.   Goals Addressed   None    Depression Screen    03/29/2023    3:23 PM 01/24/2023    2:54 PM 01/09/2023   10:27 AM 10/08/2022    2:28 PM 08/27/2022    2:06 PM 06/07/2022    2:55 PM 03/20/2022    1:29 PM  PHQ 2/9 Scores  PHQ - 2 Score 3 3 2 3 3 4 2   PHQ- 9 Score 5 6 5 5 4 8 3     Fall Risk    03/29/2023    3:23 PM 01/24/2023    2:54 PM 01/09/2023   10:27 AM 10/08/2022    2:27 PM 08/27/2022    2:09 PM  Fall Risk   Falls in the past year? 0 0 0 0 0  Number falls in past yr: 0 0 0    Injury with Fall?  0 0      MEDICARE RISK AT HOME:    TIMED UP AND GO:  Was the test performed? {AMBTIMEDUPGO:[phone]}    Cognitive Function:        Immunizations Immunization History  Administered Date(s) Administered   Influenza,inj,Quad PF,6+ Mos 05/12/2020, 06/29/2021   PFIZER(Purple Top)SARS-COV-2 Vaccination 12/04/2019, 12/25/2019   PNEUMOCOCCAL CONJUGATE-20 01/09/2023   Pfizer(Comirnaty)Fall Seasonal Vaccine 12 years and older 03/29/2023   Tdap 11/23/2020    {TDAP status:2101805}  {Flu Vaccine status:2101806}  {Pneumococcal vaccine status:2101807}  {Covid-19 vaccine status:2101808}  Qualifies for Shingles Vaccine? {YES/NO:21197}  Zostavax completed {YES/NO:21197}  {Shingrix Completed?:2101804}  Screening Tests Health Maintenance  Topic Date Due   Medicare Annual Wellness (AWV)  Never done   Zoster Vaccines- Shingrix (1 of 2) Never done   OPHTHALMOLOGY EXAM  11/09/2022   INFLUENZA VACCINE  01/24/2023   Lung Cancer Screening  03/08/2023   Diabetic kidney evaluation - Urine ACR  03/21/2023   COVID-19 Vaccine (4 - 2023-24 season) 05/24/2023  Cervical Cancer Screening (HPV/Pap Cotest)  08/11/2023   HEMOGLOBIN A1C  09/30/2023   FOOT EXAM  01/24/2024   MAMMOGRAM  03/13/2024   Diabetic kidney evaluation - eGFR measurement  04/01/2024   Colonoscopy  12/24/2027   DTaP/Tdap/Td (2 - Td  or Tdap) 11/24/2030   Pneumonia Vaccine 61+ Years old  Completed   DEXA SCAN  Completed   Hepatitis C Screening  Completed   HIV Screening  Completed   HPV VACCINES  Aged Out    Health Maintenance  Health Maintenance Due  Topic Date Due   Medicare Annual Wellness (AWV)  Never done   Zoster Vaccines- Shingrix (1 of 2) Never done   OPHTHALMOLOGY EXAM  11/09/2022   INFLUENZA VACCINE  01/24/2023   Lung Cancer Screening  03/08/2023   Diabetic kidney evaluation - Urine ACR  03/21/2023    {Colorectal cancer screening:2101809}  {Mammogram status:21018020}  {Bone Density status:21018021}  Lung Cancer Screening: (Low Dose CT Chest recommended if Age 74-80 years, 20 pack-year currently smoking OR have quit w/in 15years.) {DOES NOT does:27190::"does not"} qualify.   Lung Cancer Screening Referral: ***  Additional Screening:  Hepatitis C Screening: {DOES NOT does:27190::"does not"} qualify; Completed ***  Vision Screening: Recommended annual ophthalmology exams for early detection of glaucoma and other disorders of the eye. Is the patient up to date with their annual eye exam?  {YES/NO:21197} Who is the provider or what is the name of the office in which the patient attends annual eye exams? *** If pt is not established with a provider, would they like to be referred to a provider to establish care? {YES/NO:21197}.   Dental Screening: Recommended annual dental exams for proper oral hygiene  Diabetic Foot Exam: {Diabetic Foot Exam:2101802}  Community Resource Referral / Chronic Care Management: CRR required this visit?  {YES/NO:21197}  CCM required this visit?  {CCM Required choices:[phone]}     Plan:     I have personally reviewed and noted the following in the patient's chart:   Medical and social history Use of alcohol, tobacco or illicit drugs  Current medications and supplements including opioid prescriptions. {Opioid [phone]} Functional ability  and status Nutritional status Physical activity Advanced directives List of other physicians Hospitalizations, surgeries, and ER visits in previous 12 months Vitals Screenings to include cognitive, depression, and falls Referrals and appointments  In addition, I have reviewed and discussed with patient certain preventive protocols, quality metrics, and best practice recommendations. A written personalized care plan for preventive services as well as general preventive health recommendations were provided to patient.     Remi Haggard, LPN   45/09/979   After Visit Summary: {CHL AMB AWV After Visit Summary:[id number]}  Nurse Notes: ***

## 2023-04-02 NOTE — Plan of Care (Signed)

## 2023-04-02 NOTE — Progress Notes (Addendum)
PROGRESS NOTE    Monica Watson  ZOX:[id number] DOB: 30-Sep-1955 DOA: 03/31/2023 PCP: Elberta Fortis, MD    Hospital course:  67 year old with history of prior CVA, GERD, HTN, diet-controlled DM 2, depression, anxiety admitted for nausea vomiting and diarrhea.  CT scan overall was unremarkable besides ovarian/cervical mass.  Likely suspicion for gastroenteritis.  Pelvic ultrasound ordered.  Assessment and plan:  Acute viral gastroenteritis:  No acute findings on the CT scan.  Lipase and LFTs are negative.  Supportive care for now.  Antiemetics as needed.  Diet as tolerated.  Still persistent nausea feeling  Pelvic mass - Seen on CT scan.  Will order pelvic ultrasound.  Hypokalemia - As needed repletion   History of CVA:  On home aspirin   Essential hypertension:  Resume home p.o. medication.  IV as needed   Depression:  Continue home medications   Tobacco abuse:  As needed nicotine if needed  Diabetes mellitus type 2 - Sliding scale and Accu-Chek.  Teaching service, family medicine patient.  Discussed with the resident, Dr Melissa Noon.  They will take over tomorrow morning at 7 AM.  DVT prophylaxis-Lovenox CODE STATUS-full Disposition-continue hospital stay until abdominal symptoms improved     Subjective:  Still having some nausea, denies any diarrhea. Reporting of some chronic arthritic pain.  Examination:  General exam: Appears calm and comfortable  Respiratory system: Clear to auscultation. Respiratory effort normal. Cardiovascular system: S1 & S2 heard, RRR. No JVD, murmurs, rubs, gallops or clicks. No pedal edema. Gastrointestinal system: Abdomen is nondistended, soft and nontender. No organomegaly or masses felt. Normal bowel sounds heard. Central nervous system: Alert and oriented. No focal neurological deficits. Extremities: Symmetric 5 x 5 power. Skin: No rashes, lesions or ulcers Psychiatry: Judgement and insight appear normal. Mood & affect  appropriate.       Diet Orders (From admission, onward)     Start     Ordered   04/01/23 1305  Diet full liquid Room service appropriate? Yes; Fluid consistency: Thin  Diet effective now       Question Answer Comment  Room service appropriate? Yes   Fluid consistency: Thin      04/01/23 1305            Objective: Vitals:   04/01/23 1639 04/01/23 2139 04/02/23 0539 04/02/23 0820  BP: (!) 160/99 (!) 165/99 (!) 148/87 (!) 160/82  Pulse: (!) 56 77 77 80  Resp:  18 18 17   Temp:  100.1 F (37.8 C) 99.4 F (37.4 C) 99 F (37.2 C)  TempSrc:  Oral Oral Oral  SpO2: 98% 99% 99% 100%  Weight:      Height:        Intake/Output Summary (Last 24 hours) at 04/02/2023 1131 Last data filed at 04/02/2023 8119 Gross per 24 hour  Intake 1512.72 ml  Output --  Net 1512.72 ml   Filed Weights   03/31/23 1845  Weight: 87 kg    Scheduled Meds:  aspirin EC  81 mg Oral Daily   enoxaparin (LOVENOX) injection  40 mg Subcutaneous Daily   gabapentin  800 mg Oral QHS   insulin aspart  0-5 Units Subcutaneous QHS   insulin aspart  0-9 Units Subcutaneous TID WC   losartan  50 mg Oral Daily   [START ON 04/03/2023] pantoprazole  40 mg Oral QAC breakfast   potassium chloride  40 mEq Oral Q4H   Continuous Infusions:  Nutritional status     Body mass index is 28.32 kg/m.  Data  Reviewed:   CBC: Recent Labs  Lab 03/31/23 1848 04/01/23 0109 04/01/23 0329 04/01/23 1422  WBC 6.1 7.1 10.3 9.6  NEUTROABS 4.9  --   --   --   HGB 15.0 14.3 14.4 14.1  HCT 44.2 43.4 42.8 42.0  MCV 94.6 96.2 93.7 92.9  PLT 239 228 226 224   Basic Metabolic Panel: Recent Labs  Lab 03/31/23 1848 04/01/23 0109 04/01/23 0329 04/02/23 0446  NA 139  --  138 132*  K 3.8  --  3.6 3.3*  CL 102  --  103 98  CO2 25  --  23 25  GLUCOSE 190*  --  158* 160*  BUN 9  --  9 6*  CREATININE 0.86 0.73 0.65 0.70  CALCIUM 9.2  --  9.0 8.8*  MG  --   --   --  1.7   GFR: Estimated Creatinine Clearance: 82.5  mL/min (by C-G formula based on SCr of 0.7 mg/dL). Liver Function Tests: Recent Labs  Lab 03/31/23 1848 04/01/23 0329  AST 19 13*  ALT 19 16  ALKPHOS 52 51  BILITOT 0.8 1.1  PROT 6.9 6.3*  ALBUMIN 3.8 3.5   Recent Labs  Lab 03/31/23 1848  LIPASE 20   No results for input(s): "AMMONIA" in the last 168 hours. Coagulation Profile: No results for input(s): "INR", "PROTIME" in the last 168 hours. Cardiac Enzymes: No results for input(s): "CKTOTAL", "CKMB", "CKMBINDEX", "TROPONINI" in the last 168 hours. BNP (last 3 results) No results for input(s): "PROBNP" in the last 8760 hours. HbA1C: No results for input(s): "HGBA1C" in the last 72 hours. CBG: Recent Labs  Lab 04/01/23 0856 04/01/23 1650 04/01/23 2138  GLUCAP 171* 211* 165*   Lipid Profile: No results for input(s): "CHOL", "HDL", "LDLCALC", "TRIG", "CHOLHDL", "LDLDIRECT" in the last 72 hours. Thyroid Function Tests: No results for input(s): "TSH", "T4TOTAL", "FREET4", "T3FREE", "THYROIDAB" in the last 72 hours. Anemia Panel: No results for input(s): "VITAMINB12", "FOLATE", "FERRITIN", "TIBC", "IRON", "RETICCTPCT" in the last 72 hours. Sepsis Labs: No results for input(s): "PROCALCITON", "LATICACIDVEN" in the last 168 hours.  Recent Results (from the past 240 hour(s))  SARS Coronavirus 2 by RT PCR (hospital order, performed in [facility] hospital lab) *cepheid single result test* Anterior Nasal Swab     Status: None   Collection Time: 03/31/23  9:29 PM   Specimen: Anterior Nasal Swab  Result Value Ref Range Status   SARS Coronavirus 2 by RT PCR NEGATIVE NEGATIVE Final    Comment: Performed at [facility], [address], [place], [place] [address]         Radiology Studies: CT ABDOMEN PELVIS W CONTRAST  Result Date: 03/31/2023 CLINICAL DATA:  Diverticulitis, complication suspected EXAM: CT ABDOMEN AND PELVIS WITH CONTRAST TECHNIQUE: Multidetector CT imaging of the abdomen and pelvis was performed  using the standard protocol following bolus administration of intravenous contrast. RADIATION DOSE REDUCTION: This exam was performed according to the departmental dose-optimization program which includes automated exposure control, adjustment of the mA and/or kV according to patient size and/or use of iterative reconstruction technique. CONTRAST:  75mL OMNIPAQUE IOHEXOL 350 MG/ML SOLN COMPARISON:  Pelvic ultrasound 02/14/2022 FINDINGS: Lower chest: No acute abnormality.  Small hiatal hernia. Hepatobiliary: No focal liver abnormality. Status post cholecystectomy. No biliary dilatation. Pancreas: Diffusely atrophic. No focal lesion. Otherwise normal pancreatic contour. No surrounding inflammatory changes. No main pancreatic ductal dilatation. Spleen: Normal in size without focal abnormality. Adrenals/Urinary Tract: No adrenal nodule bilaterally. Bilateral kidneys  enhance symmetrically. No hydronephrosis. No hydroureter. The urinary bladder is unremarkable. On delayed imaging, there is no urothelial wall thickening and there are no filling defects in the opacified portions of the bilateral collecting systems or ureters. Stomach/Bowel: Stomach is within normal limits. No evidence of bowel wall thickening or dilatation. Single colonic diverticula of the sigmoid colon. Majority of the colon is under distended and collapsed. Appendix appears normal. Vascular/Lymphatic: No abdominal aorta or iliac aneurysm. Mild atherosclerotic plaque of the aorta and its branches. No abdominal, pelvic, or inguinal lymphadenopathy. Reproductive: Asymmetrically prominent right ovary/adnexal region with associated calcifications. Masslike hypodensity of the cervical region measuring 4.6 x 3 cm. Intramural calcified uterine fibroid. Left adnexal region is unremarkable. Other: No intraperitoneal free fluid. No intraperitoneal free gas. No organized fluid collection. Musculoskeletal: No abdominal wall hernia or abnormality. No suspicious lytic  or blastic osseous lesions. No acute displaced fracture. IMPRESSION: 1. Asymmetrically prominent right ovary/adnexal region with associated calcifications. Question underlying mass. Recommend pelvic ultrasound for further evaluation. 2. Masslike hypodensity of the cervical region measuring 4.6 x 3 cm. Finding can also be further evaluated on pelvic ultrasound. 3. Intramural uterine fibroid. 4. Small hiatal hernia. 5. Single diverticula of the sigmoid colon with no associated acute diverticulitis. Electronically Signed   By: Tish Frederickson M.D.   On: 03/31/2023 22:05           LOS: 1 day   Time spent= 35 mins    Miguel Rota, MD [facility]  If 7PM-7AM, please contact night-coverage  04/02/2023, 11:31 AM

## 2023-04-02 NOTE — Hospital Course (Signed)
Jessieca Rhem is a 67 y.o. female with a history of CVA, GERD, hypertension, T2DM, depression, anxiety who was admitted to the [facility] Medicine Teaching Service at [facility] for acute viral gastroenteritis. Hospital course is outlined below by problem.   Gastroenteritis Presented with nausea, vomiting, diarrhea.  He was hypertensive in the ED.  CBC and CMP overall unremarkable.  Quad viral screen neck if.  Lipase and LFTs normal.  CTAP without acute finding.  He was admitted for IV fluids and symptomatic treatment of nausea and vomiting.  By discharge, symptoms had improved she was tolerating p.o. well.***  Right adnexal and cervical mass Presented with abdominal pain as above. CTAP with right ovary/adnexal region with associated calcification and question of underlying mass.  Masslike hypodensity of the cervical region also appreciated on CTAP.  Pelvic ultrasound with***.  Hypokalemia Likely in the setting of decreased p.o. intake and gastric losses.  K was repleted as needed over admission.  T2DM Sliding scale used for sugar control.  Home medicines resumed at discharge.***  Other conditions that were chronic and stable: History of CVA (continued aspirin), anxiety (continued as needed Klonopin), hypertension (continued home meds), tobacco use  Issues for follow up: Assess BP control when not acutely ill and in pain Repeat BMP for electrolytes Pelvic mass***?

## 2023-04-03 ENCOUNTER — Inpatient Hospital Stay (HOSPITAL_COMMUNITY): Payer: 59

## 2023-04-03 ENCOUNTER — Ambulatory Visit (HOSPITAL_COMMUNITY): Payer: 59

## 2023-04-03 DIAGNOSIS — N9489 Other specified conditions associated with female genital organs and menstrual cycle: Secondary | ICD-10-CM | POA: Insufficient documentation

## 2023-04-03 DIAGNOSIS — K529 Noninfective gastroenteritis and colitis, unspecified: Secondary | ICD-10-CM | POA: Diagnosis not present

## 2023-04-03 LAB — BASIC METABOLIC PANEL
Anion gap: 9 (ref 5–15)
BUN: 11 mg/dL (ref 8–23)
CO2: 24 mmol/L (ref 22–32)
Calcium: 9.3 mg/dL (ref 8.9–10.3)
Chloride: 99 mmol/L (ref 98–111)
Creatinine, Ser: 0.76 mg/dL (ref 0.44–1.00)
GFR, Estimated: >60 mL/min (ref >60)
Glucose, Bld: 96 mg/dL (ref 70–99)
Potassium: 4.3 mmol/L (ref 3.5–5.1)
Sodium: 132 mmol/L — ABNORMAL LOW (ref 135–145)

## 2023-04-03 LAB — MAGNESIUM: Magnesium: 1.9 mg/dL (ref 1.7–2.4)

## 2023-04-03 LAB — GLUCOSE, CAPILLARY: Glucose-Capillary: 105 mg/dL — ABNORMAL HIGH (ref 70–99)

## 2023-04-03 MED ORDER — GADOBUTROL 1 MMOL/ML IV SOLN
9.0000 mL | Freq: Once | INTRAVENOUS | Status: AC | PRN
  Administered 2023-04-03: 9 mL via INTRAVENOUS

## 2023-04-03 MED ORDER — CLONAZEPAM 0.125 MG PO TBDP: 0.2500 mg | ORAL_TABLET | Freq: Two times a day (BID) | ORAL | Status: DC | PRN

## 2023-04-03 NOTE — Progress Notes (Signed)
Patient started visit then stated she was in hospital .     Did not finish AWV due to patient being in hospital .

## 2023-04-03 NOTE — Assessment & Plan Note (Signed)
Patient underwent pelvic ultrasound after initial discovery of mass on CT.  Ultrasound showed narrowing calcifications which may represent exophytic fibroid versus ovarian mass and MRI with contrast suggested for further characterization -MRI with and without contrast ordered -Consider GYN consult if appropriate based on MRI results

## 2023-04-03 NOTE — Progress Notes (Deleted)
   Patient in  Hospital unable to finish AWV.

## 2023-04-03 NOTE — Progress Notes (Signed)
Daily Progress Note Intern Pager: [phone]  Patient name: Monica Watson Medical record number: [id number] Date of birth: 1955-07-14 Age: 67 y.o. Gender: female  Primary Care Provider: Elberta Fortis, MD Consultants: None Code Status: Full  Pt Overview and Major Events to Date:  Admitted: 10/6  Assessment and Plan:  Monica Watson is a 67 year old female who was initially admitted for vomiting and diarrhea.  She has a history of prior CVA, GERD, hypertension, diet-controlled diabetes mellitus type 2, depression, anxiety.  Her nausea and vomiting have overall resolved but she has some residual gastric tenderness and has yet to advance her diet fully.  CT scan done previously also revealed a previously unknown adnexal mass which has calcifications and will require further imaging for assessment. Assessment & Plan Acute gastroenteritis Patient's pain is mild today in comparison with previously and her nausea and diarrhea have resolved.  Her hydration status has returned to an acceptable level.  Diet has not fully advanced to solid foods at this time. -Continue Zofran as needed -Advance diet as tolerated Adnexal mass Patient underwent pelvic ultrasound after initial discovery of mass on CT.  Ultrasound showed narrowing calcifications which may represent exophytic fibroid versus ovarian mass and MRI with contrast suggested for further characterization -MRI with and without contrast ordered -Consider GYN consult if appropriate based on MRI results   Chronic and Stable Problems:  Chronic bilateral low back pain-as needed Percocet every 6 hours Hypertension-continue home Cozaar 50 mg daily Depression Anxiety Prediabetes  FEN/GI: Full liquid advance as tolerated PPx: None patient is mobile Dispo:Home pending clinical improvement .   Subjective:  Patient was awake lying in bed comfortably when I evaluated her this morning.  She reports reduction in pain and nausea with still  some epigastric tenderness.  Objective: Temp:  [98.3 F (36.8 C)-99.3 F (37.4 C)] 98.3 F (36.8 C) (10/09 1425) Pulse Rate:  [63-81] 66 (10/09 1425) Resp:  [18] 18 (10/09 1425) BP: (128-173)/(79-98) 128/79 (10/09 1425) SpO2:  [96 %-100 %] 99 % (10/09 1425) Physical Exam: General: Alert, oriented no obvious distress Cardiovascular: RRR, no M/R/G Respiratory: CTAB, no increased work of breathing Abdomen: Flat, soft.  Mild epigastric tenderness to palpation.  Mild left lower quadrant tenderness to palpation. Extremities: Capillary refill within normal limits.  2+ bilateral pulses in all 4 extremities  Laboratory: Most recent CBC Lab Results  Component Value Date   WBC 9.6 04/01/2023   HGB 14.1 04/01/2023   HCT 42.0 04/01/2023   MCV 92.9 04/01/2023   PLT 224 04/01/2023   Most recent BMP    Latest Ref Rng & Units 04/03/2023    4:38 AM  BMP  Glucose 70 - 99 mg/dL 96   BUN 8 - 23 mg/dL 11   Creatinine 1.30 - 1.00 mg/dL 8.65   Sodium 784 - 696 mmol/L 132   Potassium 3.5 - 5.1 mmol/L 4.3   Chloride 98 - 111 mmol/L 99   CO2 22 - 32 mmol/L 24   Calcium 8.9 - 10.3 mg/dL 9.3    Gerrit Heck, DO 04/03/2023, 4:30 PM  PGY-1, [facility] Health Family Medicine FPTS Intern pager: [phone], text pages welcome Secure chat group [facility]

## 2023-04-03 NOTE — Plan of Care (Signed)
Problem: Education: Goal: Knowledge of General Education information will improve Description: Including pain rating scale, medication(s)/side effects and non-pharmacologic comfort measures Outcome: Progressing Pt understands he was admitted for with with nausea vomiting and diarrhea.  She has an admitting diagnosis of acute gastroenteritis.  She is pending an MRI of her pelvis per MD's orders.   Problem: Clinical Measurements: Goal: Will remain free from infection Outcome: Progressing S/Sx of infection monitored and assessed q-shift.  Pt has remained afebrile thus far.     Problem: Clinical Measurements: Goal: Respiratory complications will improve Outcome: Progressing Respiratory status monitored and assessed q-shift.  Pt is on room air with PO2 at 96-99% and respiration rate of 18 breaths per minute.  Pt has not endorsed c/o SOB or DOE.    Problem: Clinical Measurements: Goal: Cardiovascular complication will be avoided Outcome: Progressing Pt's VS WNL thus far.    Problem: Activity: Goal: Risk for activity intolerance will decrease Outcome: Progressing Pt is independent of all her ADLs.  She can get up OOB with a steady gait independently.    Problem: Nutrition: Goal: Adequate nutrition will be maintained Outcome: Progressing Pt is on a full liquid diet per MD's orders and can tolerate it w/o s/sx of abdominal pain/ distention or n/v.    Problem: Safety: Goal: Ability to remain free from injury will improve Outcome: Progressing Pt has remained free from falls thus far.  Instructed pt to utilize RN call light for assistance.  Hourly rounds performed.  Bed in lowest position, locked with two upper side rails engaged.  Belongings and call light within reach.    Problem: Skin Integrity: Goal: Risk for impaired skin integrity will decrease Outcome: Progressing Skin integrity monitored and assessed q-shift.  Instructed pt to turn q2 hours to prevent further skin impairment.   Tubes and drains assessed for device related pressures sores.  Pt is continent of both bowel and bladder.

## 2023-04-03 NOTE — Discharge Instructions (Addendum)
It was a pleasure to care for you in the hospital and we are so glad that you are feeling better!  While in the hospital we treated the following problems: Gastroenteritis, dehydration, low potassium.  We gave you potassium and fluids while you are here in the hospital.  Both of these problems are related to your gastroenteritis which was most likely caused by a virus.  When you return home please get plenty of rest and drink lots of fluids to maintain your hydration.  You can start to introduce foods back into your diet slowly and as you are able to tolerate them.  Please take any medications as prescribed or with changes as listed in your discharge instructions.  You will need to have follow-up with your primary care doctor, Dr. Elberta Fortis, MD.  If you experience any of the following symptoms, please return to the hospital: - Chest pain -Shortness of breath -New or worsening vomiting or diarrhea. -Weakness, dizziness -Inability to eat or drink anything for greater than 24 hours  For any other questions or concerns or medication refill needs please contact your primary care provider.

## 2023-04-03 NOTE — Assessment & Plan Note (Signed)
Patient's pain is mild today in comparison with previously and her nausea and diarrhea have resolved.  Her hydration status has returned to an acceptable level.  Diet has not fully advanced to solid foods at this time. -Continue Zofran as needed -Advance diet as tolerated

## 2023-04-04 DIAGNOSIS — N9489 Other specified conditions associated with female genital organs and menstrual cycle: Secondary | ICD-10-CM

## 2023-04-04 DIAGNOSIS — A084 Viral intestinal infection, unspecified: Principal | ICD-10-CM

## 2023-04-04 DIAGNOSIS — K529 Noninfective gastroenteritis and colitis, unspecified: Secondary | ICD-10-CM | POA: Diagnosis not present

## 2023-04-04 LAB — BASIC METABOLIC PANEL
Anion gap: 12 (ref 5–15)
BUN: 13 mg/dL (ref 8–23)
CO2: 28 mmol/L (ref 22–32)
Calcium: 9.4 mg/dL (ref 8.9–10.3)
Chloride: 96 mmol/L — ABNORMAL LOW (ref 98–111)
Creatinine, Ser: 0.99 mg/dL (ref 0.44–1.00)
GFR, Estimated: >60 mL/min (ref >60)
Glucose, Bld: 94 mg/dL (ref 70–99)
Potassium: 3.8 mmol/L (ref 3.5–5.1)
Sodium: 136 mmol/L (ref 135–145)

## 2023-04-04 LAB — MAGNESIUM: Magnesium: 2.2 mg/dL (ref 1.7–2.4)

## 2023-04-04 NOTE — Progress Notes (Signed)
Explained discharge instructions to patient. Reviewed follow up appointment and next medication administration times. Also reviewed education. Patient verbalized having an understanding for instructions given. All belongings are in the patient's possession. IV was removed. No other needs verbalized. Will transport downstairs to the discharge lounge to await Lyft transportation home.

## 2023-04-04 NOTE — Plan of Care (Signed)
Problem: Education: Goal: Knowledge of General Education information will improve Description: Including pain rating scale, medication(s)/side effects and non-pharmacologic comfort measures Outcome: Progressing Pt understands he was admitted for with with nausea vomiting and diarrhea.  She has an admitting diagnosis of acute gastroenteritis.  Pt had an MRI of her pelvis per MD's orders on 04/03/2023.  She currently is awaiting d/c home to self care.     Problem: Clinical Measurements: Goal: Will remain free from infection Outcome: Progressing S/Sx of infection monitored and assessed q-shift.  Pt has remained afebrile thus far.     Problem: Clinical Measurements: Goal: Respiratory complications will improve Outcome: Progressing Respiratory status monitored and assessed q-shift.  Pt is on room air with PO2 at 96-100% and respiration rate of  15-18 breaths per minute.  Pt has not endorsed c/o SOB or DOE.    Problem: Clinical Measurements: Goal: Cardiovascular complication will be avoided Outcome: Progressing Pt's VS WNL thus far.    Problem: Activity: Goal: Risk for activity intolerance will decrease Outcome: Progressing Pt is independent of all her ADLs.  She can get up OOB with a steady gait independently.    Problem: Nutrition: Goal: Adequate nutrition will be maintained Outcome: Progressing Pt is on a full liquid diet per MD's orders and can tolerate it w/o s/sx of abdominal pain/ distention or n/v.    Problem: Safety: Goal: Ability to remain free from injury will improve Outcome: Progressing Pt has remained free from falls thus far.  Instructed pt to utilize RN call light for assistance.  Hourly rounds performed.  Bed in lowest position, locked with two upper side rails engaged.  Belongings and call light within reach.    Problem: Skin Integrity: Goal: Risk for impaired skin integrity will decrease Outcome: Progressing Skin integrity monitored and assessed q-shift.   Instructed pt to turn q2 hours to prevent further skin impairment.  Tubes and drains assessed for device related pressures sores.  Pt is continent of both bowel and bladder.

## 2023-04-04 NOTE — Discharge Summary (Addendum)
Family Medicine Teaching [facility] Discharge Summary  Patient name: Monica Watson Medical record number: [id number] Date of birth: 1955/08/09 Age: 67 y.o. Gender: female Date of Admission: 03/31/2023  Date of Discharge: 04/04/23 Admitting Physician: Miguel Rota, MD  Primary Care Provider: Elberta Fortis, MD Consultants: None  Indication for Hospitalization: Acute gastroenteritis and dehydration  Discharge Diagnoses/Problem List:  Principal Problem for Admission: Acute gastroenteritis Other Problems addressed during stay:  Principal Problem:   Acute gastroenteritis Active Problems:   Adnexal mass    Brief Hospital Course:  Monica Watson is a 67 y.o. female with a history of CVA, GERD, hypertension, T2DM, depression, anxiety who was admitted to the [facility] Medicine Teaching Service at [facility] for acute viral gastroenteritis. Hospital course is outlined below by problem.   Gastroenteritis Presented with nausea, vomiting, diarrhea.  He was hypertensive in the ED.  CBC and CMP overall unremarkable.  Quad viral screen negative.  Lipase and LFTs normal.  CTAP without acute finding.  He was admitted for IV fluids and symptomatic treatment of nausea and vomiting.  By discharge, symptoms had improved, and she was tolerating p.o. well.  Right adnexal and cervical mass Presented with abdominal pain as above. CTAP with right ovary/adnexal region with associated calcification and question of underlying mass.  Masslike hypodensity of the cervical region also appreciated on CTAP.  Pelvic ultrasound with masslike structure of right adnexa, MRI with contrast was recommended for further characterization.  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  Outpatient follow-up was recommended.  Hypokalemia Likely in the setting of decreased p.o. intake and gastric losses.  K was repleted as needed over admission.  T2DM Sliding scale used for sugar control on admission.  Patient's blood sugars remained in an acceptable range throughout her admission.  Home medicines resumed at discharge.  Other conditions that were chronic and stable: History of CVA (continued aspirin), anxiety (continued as needed Klonopin), hypertension (continued home meds), tobacco use  Issues for follow up: Assess BP control when not acutely ill and in pain Repeat BMP for electrolytes Consider referral to GYN for pelvis mass    Disposition: Home  Discharge Condition: Stable  Discharge Exam:  Vitals:   04/04/23 0620 04/04/23 0745  BP: (!) 117/94 112/88  Pulse: 67 78  Resp: 15   Temp: 98.1 F (36.7 C) 98.1 F (36.7 C)  SpO2: 100% 96%   General: Alert and oriented.  No distress Cardiac: RRR, no M/R/G Respiratory: CTAB, no increased work of breathing Abdomen: Flat, soft, nontender.  Bowel sounds present and appropriate x 4 Extremities: 2+ pulses in all 4 extremities, appropriate strength and tone.  Capillary refill within normal limits  Significant Procedures: None  Significant Labs and Imaging:  No results for input(s): "WBC", "HGB", "HCT", "PLT" in the last 48 hours. Recent Labs  Lab 04/03/23 0438 04/04/23 0444  NA 132* 136  K 4.3 3.8  CL 99 96*  CO2 24 28  GLUCOSE 96 94  BUN 11 13  CREATININE 0.76 0.99  CALCIUM 9.3 9.4  MG 1.9 2.2   US pelvis: IMPRESSION: 1. Masslike structure in the right adnexa with shadowing calcifications, may represent an exophytic fibroid versus ovarian mass. MRI with contrast is suggested for further evaluation. 2. Uterine fibroid.  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  Results/Tests Pending at Time of Discharge: None  Discharge Medications:  Allergies as of 04/04/2023  No Known Allergies      Medication List     TAKE these medications    aspirin EC 81 MG tablet Take 1 tablet (81 mg total) by mouth  daily. Swallow whole.   Azelastine-Fluticasone 137-50 MCG/ACT Susp Place 1 spray into the nose 2 (two) times daily. What changed:  when to take this reasons to take this   calcium-vitamin D 500-5 MG-MCG tablet Commonly known as: OSCAL WITH D Take 1 tablet by mouth daily with breakfast.   ciclopirox 8 % solution Commonly known as: PENLAC APPLY 1 COAT TO TOENAIL DAILY, REMOVE WEEKLY WITH POLISH REMOVER   clonazePAM 0.25 MG disintegrating tablet Commonly known as: KLONOPIN Take 0.25 mg by mouth 2 (two) times daily as needed (restless legs).   gabapentin 800 MG tablet Commonly known as: NEURONTIN Take 1 tablet (800 mg total) by mouth at bedtime.   ICY HOT EX Apply 1 Application topically daily as needed (knee pain).   losartan 50 MG tablet Commonly known as: COZAAR Take 1 tablet (50 mg total) by mouth daily.   Metamucil 28 % packet Generic drug: psyllium Take 1 packet by mouth 2 (two) times daily. What changed:  when to take this reasons to take this   metFORMIN 500 MG 24 hr tablet Commonly known as: GLUCOPHAGE-XR Take one tablet daily for a week. If you do well with that, increase to one tablet in the morning and one in the evening. What changed:  how much to take how to take this when to take this additional instructions   naloxone 4 MG/0.1ML Liqd nasal spray kit Commonly known as: NARCAN Place 0.4 mg into the nose daily as needed (opioid overdose).   omeprazole 20 MG capsule Commonly known as: PRILOSEC Take 20 mg by mouth daily.   oxyCODONE-acetaminophen 10-325 MG tablet Commonly known as: PERCOCET Take 1 tablet by mouth every 6 (six) hours as needed.   polyethylene glycol 17 g packet Commonly known as: MiraLax Take 17 g by mouth daily. What changed:  when to take this reasons to take this   tiZANidine 4 MG tablet Commonly known as: Zanaflex Take 1 tablet (4 mg total) by mouth every 8 (eight) hours as needed for muscle spasms. What changed: when to  take this   Xiidra 5 % Soln Generic drug: Lifitegrast Apply 1 drop to eye daily as needed (Dry eye).        Discharge Instructions: Please refer to Patient Instructions section of EMR for full details.  Patient was counseled important signs and symptoms that should prompt return to medical care, changes in medications, dietary instructions, activity restrictions, and follow up appointments.   Follow-Up Appointments: Please follow-up with primary care within the next week.  Gerrit Heck, DO 04/04/2023, 11:42 AM PGY-1,  Family Medicine   I agree with the assessment and plan as documented in the resident's note.  Janeal Holmes, MD                  04/04/2023, 2:05 PM

## 2023-04-06 ENCOUNTER — Other Ambulatory Visit: Payer: Self-pay | Admitting: Internal Medicine

## 2023-04-09 NOTE — Progress Notes (Signed)
    SUBJECTIVE:   CHIEF COMPLAINT / HPI:   Hospital f/u Hospitalized at [facility] from 10/6 to 10/10 for viral gastroenteritis and adnexal mass.  Patient concerned her illness started after "the flu shot", discussed she received the COVID vaccination prior to hospitalization and is unlikely it was the cause of her symptoms.  Patient otherwise feels well today and states her symptoms have resolved.  Reports concerns about her housing, states she does not have heat currently.  States she needs assistance finding a new home but has had difficulty paying for deposits.  Issues for follow up: Assess BP control when not acutely ill and in pain Repeat BMP for electrolytes Consider referral to GYN for pelvis mass  PERTINENT  PMH / PSH: HTN, T2DM, h/o CVA, depression, chronic back pain   OBJECTIVE:   BP 128/70   Pulse 68   Ht 5\' 9"  (1.753 m)   Wt 182 lb 3.2 oz (82.6 kg)   SpO2 98%   BMI 26.91 kg/m    General: Alert, no apparent distress, well groomed HEENT: Normocephalic, atraumatic, moist mucus membranes, neck supple Respiratory: Normal respiratory effort GI: Non-distended Skin: No rashes, no jaundice Psych: Appropriate mood and affect  ASSESSMENT/PLAN:   Assessment & Plan Adnexal mass Inpatient pelvic MRI showed right adnexal mass 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 Patient opted for referral to GYN for further evaluation and I provided information for [facility] for self scheduling. -Referral to GYN Housing instability Unsuitable housing, currently lacks heat.  Needs of assistance finding affordable housing and housing deposit. -Referral to [facility] manage Medicaid for social work support Hypokalemia Some hypokalemia inpatient, BMP prior to discharge normal. Opted not to repeat BMP today.    Dr. Elberta Fortis, DO [facility]

## 2023-04-12 ENCOUNTER — Encounter: Payer: Self-pay | Admitting: Family Medicine

## 2023-04-12 ENCOUNTER — Ambulatory Visit (INDEPENDENT_AMBULATORY_CARE_PROVIDER_SITE_OTHER): Payer: 59 | Admitting: Family Medicine

## 2023-04-12 VITALS — BP 128/70 | HR 68 | Ht 69.0 in | Wt 182.2 lb

## 2023-04-12 DIAGNOSIS — N9489 Other specified conditions associated with female genital organs and menstrual cycle: Secondary | ICD-10-CM

## 2023-04-12 DIAGNOSIS — E876 Hypokalemia: Secondary | ICD-10-CM

## 2023-04-12 DIAGNOSIS — Z59819 Housing instability, housed unspecified: Secondary | ICD-10-CM

## 2023-04-12 IMAGING — US US FNA BIOPSY THYROID 1ST LESION
1 series · 13 of 19 positions shown · non-contrast
Comparison: Thyroid ultrasound 04/17/2021

MEDICATIONS:
None

COMPLICATIONS:
None immediate.

INDICATION: Indeterminate thyroid nodules

EXAM:
ULTRASOUND GUIDED FINE NEEDLE ASPIRATION OF INDETERMINATE THYROID
NODULE
TECHNIQUE: Informed written consent was obtained from the patient after a
discussion of the risks, benefits and alternatives to treatment.
Questions regarding the procedure were encouraged and answered. A
timeout was performed prior to the initiation of the procedure.

[Series 1: us fna biopsy thyroid 1st lesion · 0.08mm/px · 19 acquisitions, 13 frames shown]
[im 1/19]
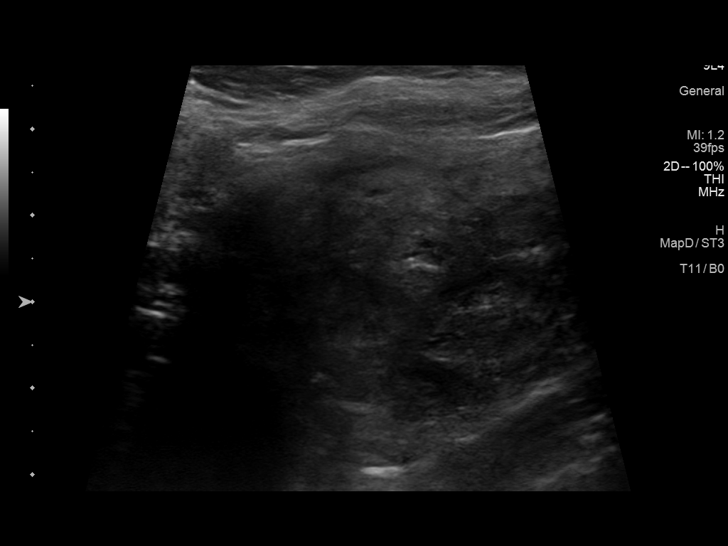
[im 3/19]
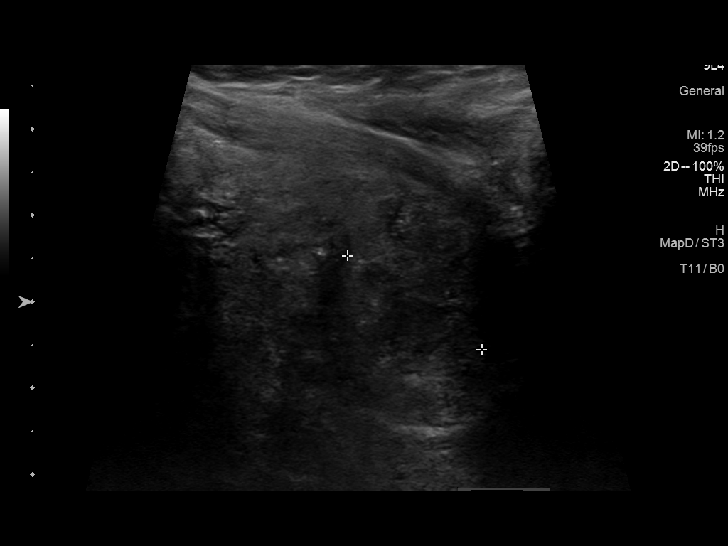
[im 4/19]
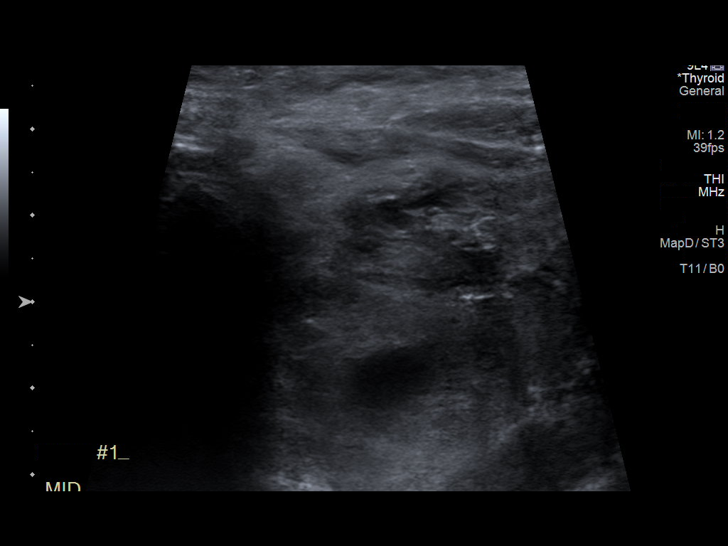
[im 6/19]
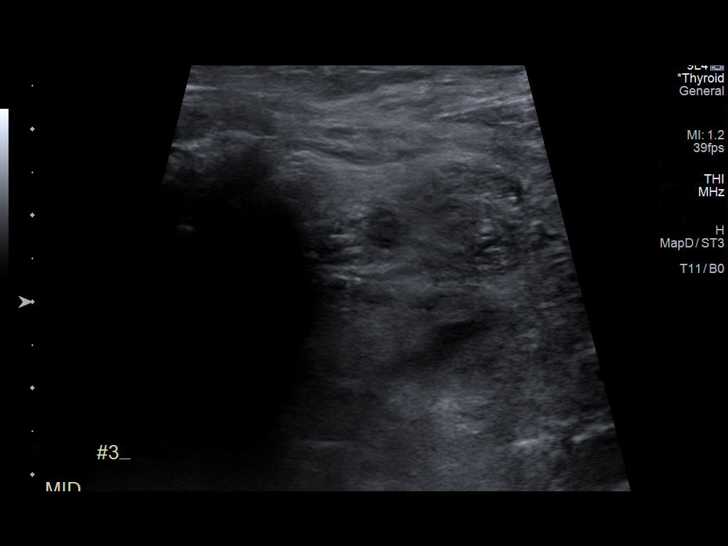
[im 7/19]
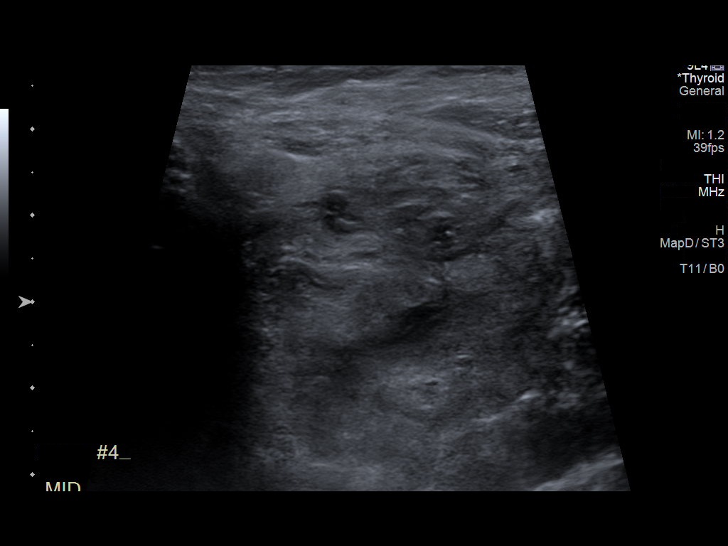
[im 9/19]
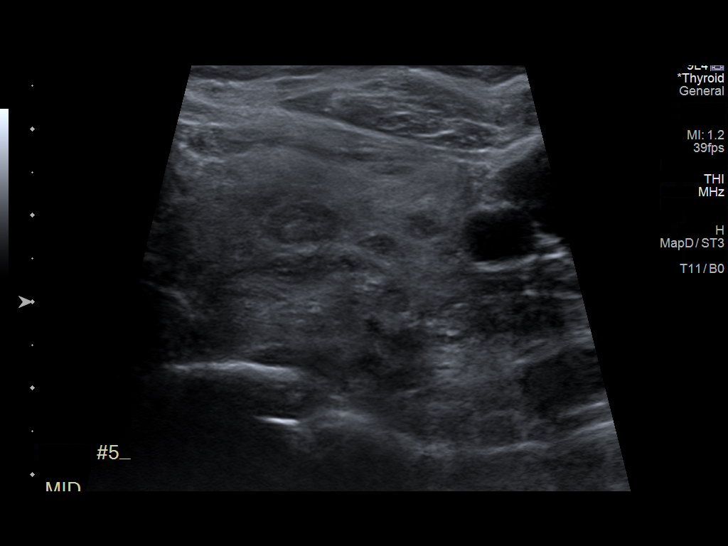
[im 10/19]
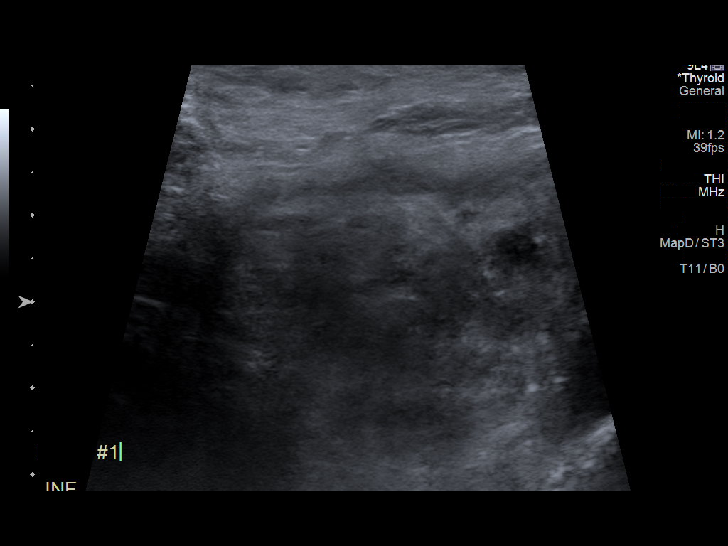
[im 11/19]
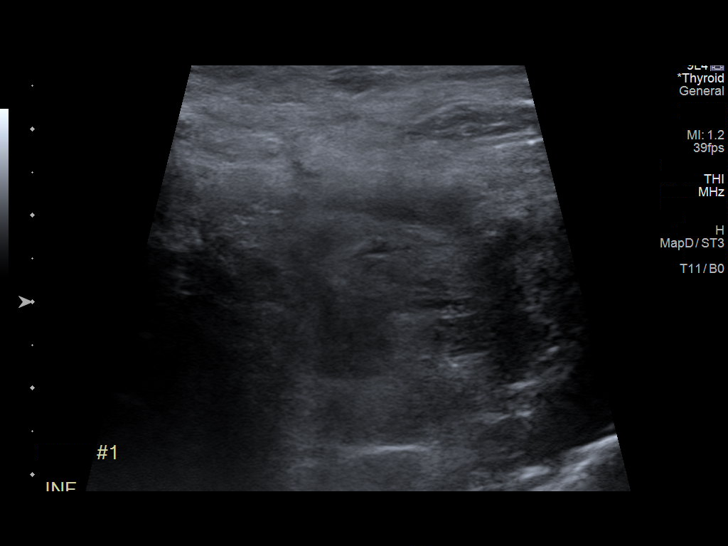
[im 13/19]
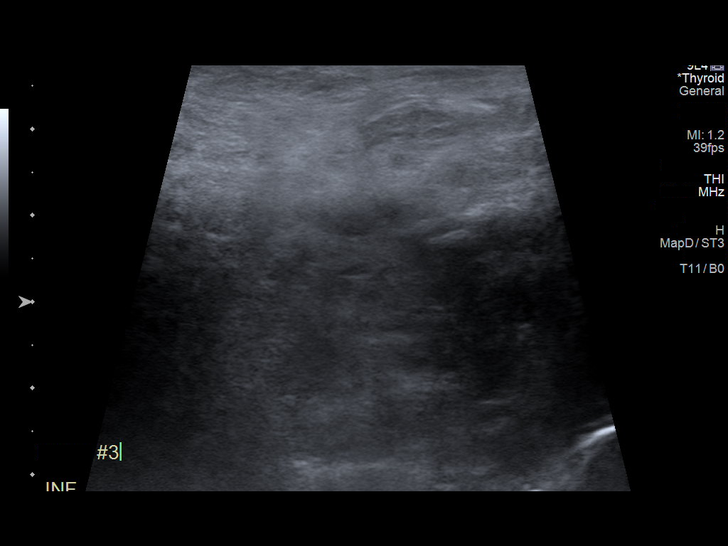
[im 14/19]
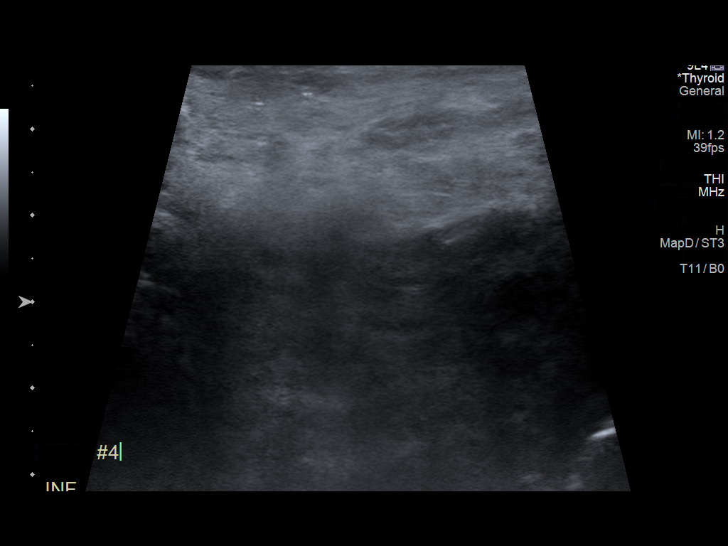
[im 16/19]
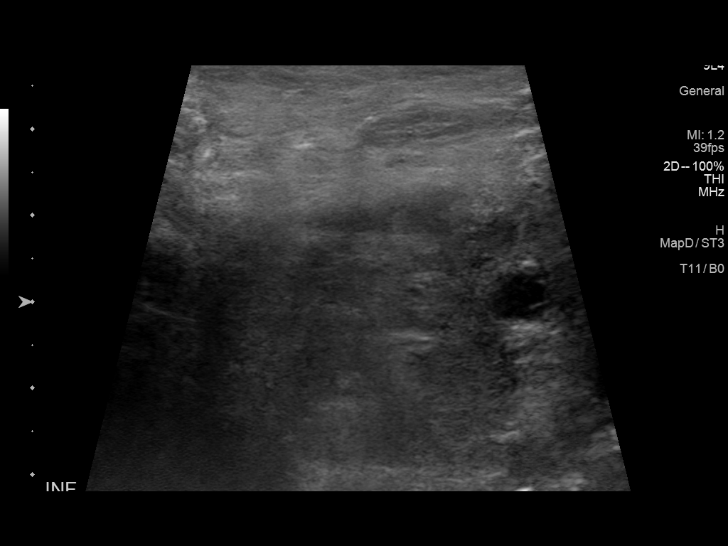
[im 17/19]
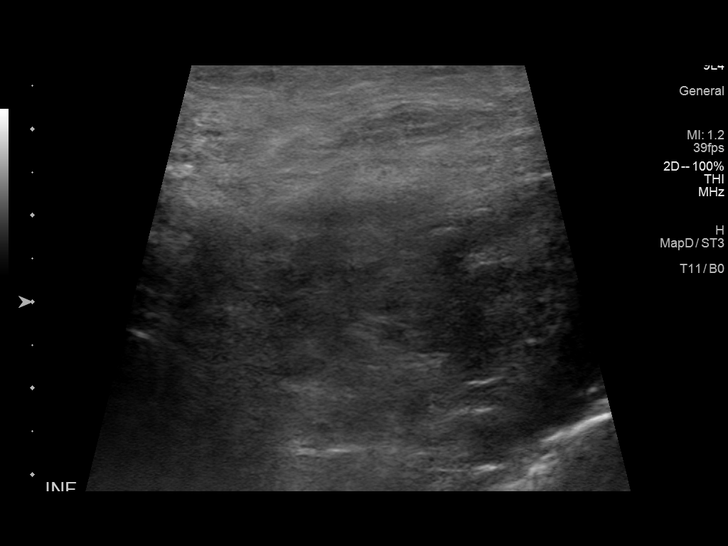
[im 19/19]
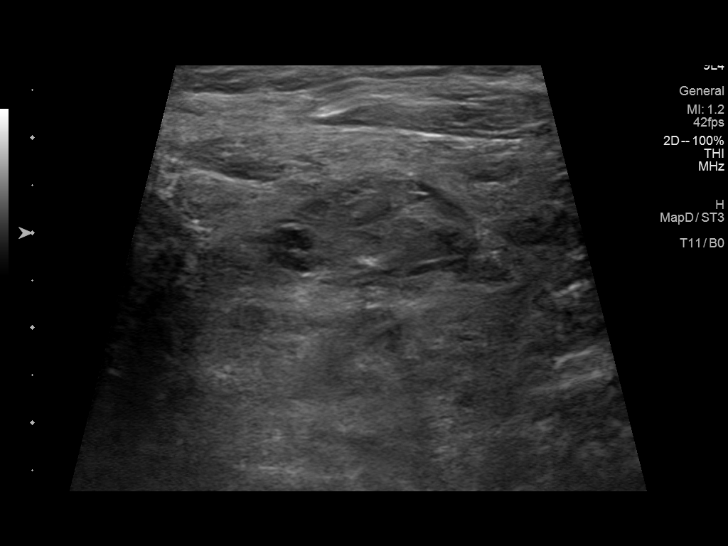

[13 of 19 positions shown; findings below may reference images not displayed]

Pre-procedural ultrasound scanning demonstrated unchanged size and
appearance of the indeterminate nodules within the left lobe

The procedure was planned. The neck was prepped in the usual sterile
fashion, and a sterile drape was applied covering the operative
field. A timeout was performed prior to the initiation of the
procedure. Local anesthesia was provided with 1% lidocaine.

Under direct ultrasound guidance, 5 FNA biopsies were performed of
the left middle lobe nodule with a 27 gauge needle. Multiple
ultrasound images were saved for procedural documentation purposes.
The samples were prepared and submitted to pathology. Two of these
samples were reserved for [facility]ir[facility].

Under direct ultrasound guidance, 5 FNA biopsies were performed of
the left inferior lobe nodule with a 27 gauge needle. Multiple
ultrasound images were saved for procedural documentation purposes.
The samples were prepared and submitted to pathology. Two of the
samples were also reserved for Afirma.

Limited post procedural scanning was negative for hematoma or
additional complication. Dressings were placed. The patient
tolerated the above procedures procedure well without immediate
postprocedural complication.
FINDINGS: Nodule reference number based on prior diagnostic ultrasound: 1

Maximum size: 2.9 cm

Location: Left; Mid

ACR TI-RADS risk category: TR 4

Reason for biopsy: meets ACR TI-RADS criteria

_________________________________________________________

Nodule reference number based on prior diagnostic ultrasound: 3

Maximum size: 2.6 cm

Location: Left; Inferior

ACR TI-RADS risk category: TR 4

Reason for biopsy: meets ACR TI-RADS criteria

Ultrasound imaging confirms appropriate placement of the needles
within the thyroid nodule.
IMPRESSION: Technically successful ultrasound guided fine needle aspiration of
TR-4 left thyroid nodules, as described above

Read by: Licun Lines, PA-C

## 2023-04-12 IMAGING — US US FNA BIOPSY THYROID 1ST LESION
1 series · 13 of 19 positions shown · non-contrast
Comparison: Thyroid ultrasound 04/17/2021

MEDICATIONS:
None

COMPLICATIONS:
None immediate.

INDICATION: Indeterminate thyroid nodules

EXAM:
ULTRASOUND GUIDED FINE NEEDLE ASPIRATION OF INDETERMINATE THYROID
NODULE
TECHNIQUE: Informed written consent was obtained from the patient after a
discussion of the risks, benefits and alternatives to treatment.
Questions regarding the procedure were encouraged and answered. A
timeout was performed prior to the initiation of the procedure.

[Series 1: us fna biopsy thyroid 1st lesion · 0.08mm/px · 19 acquisitions, 13 frames shown]
[im 1/19]
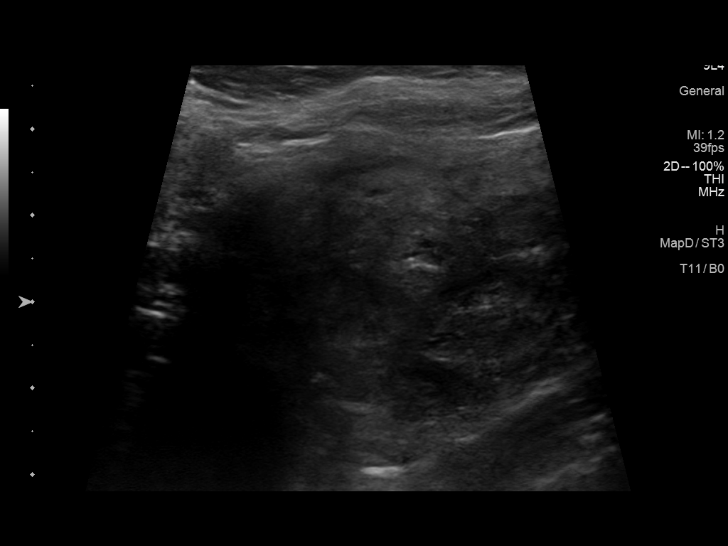
[im 3/19]
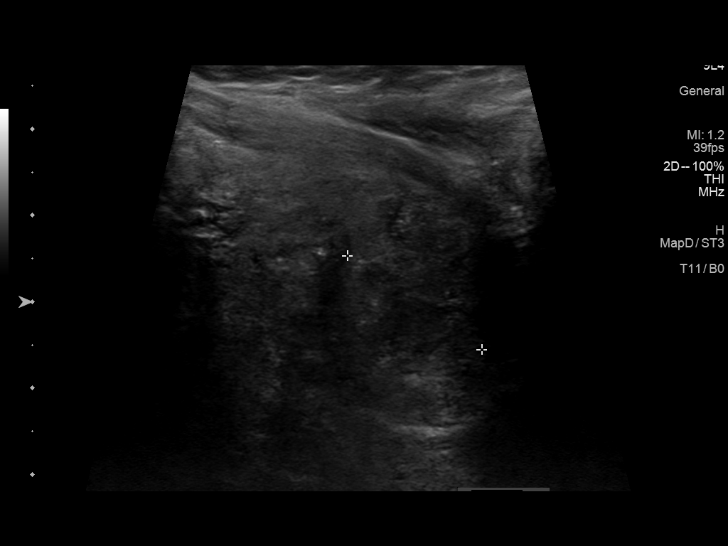
[im 4/19]
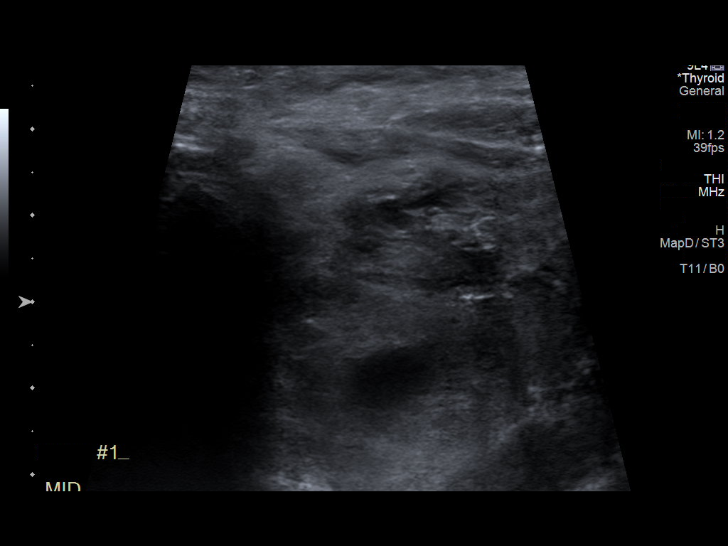
[im 6/19]
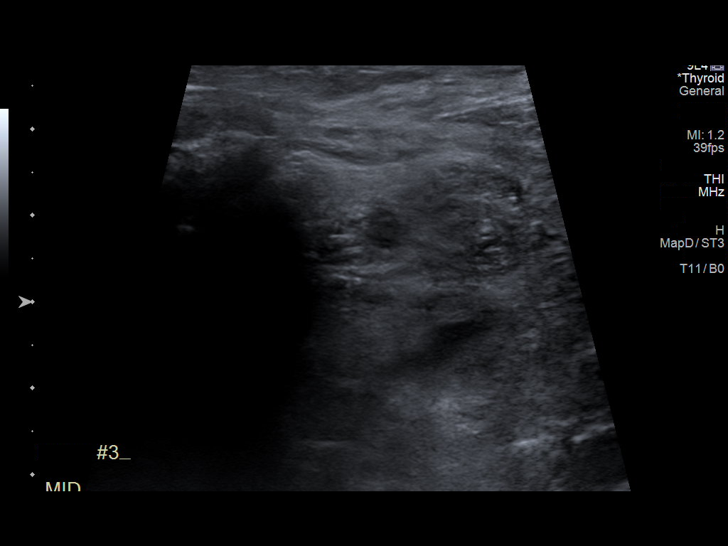
[im 7/19]
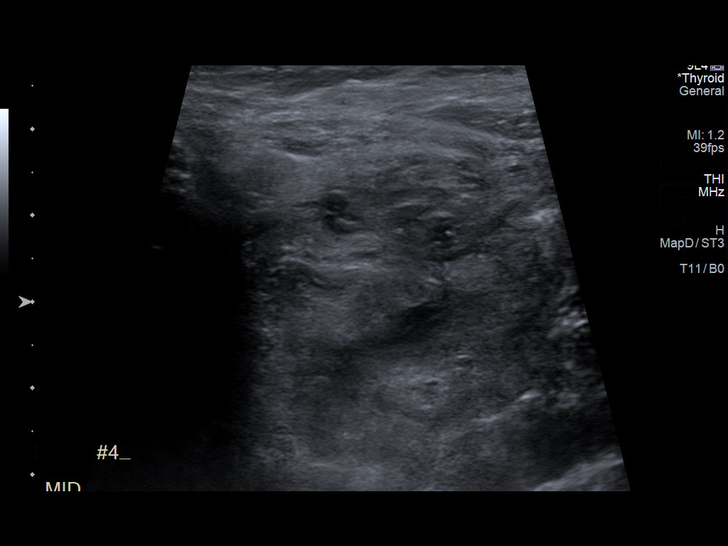
[im 9/19]
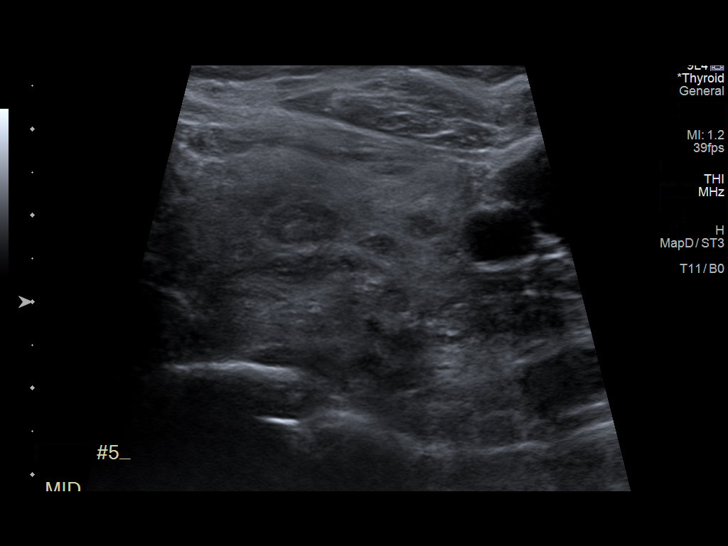
[im 10/19]
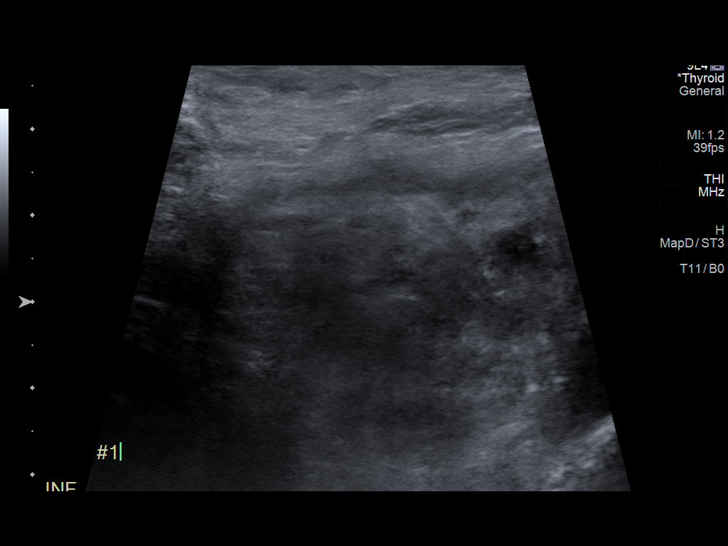
[im 11/19]
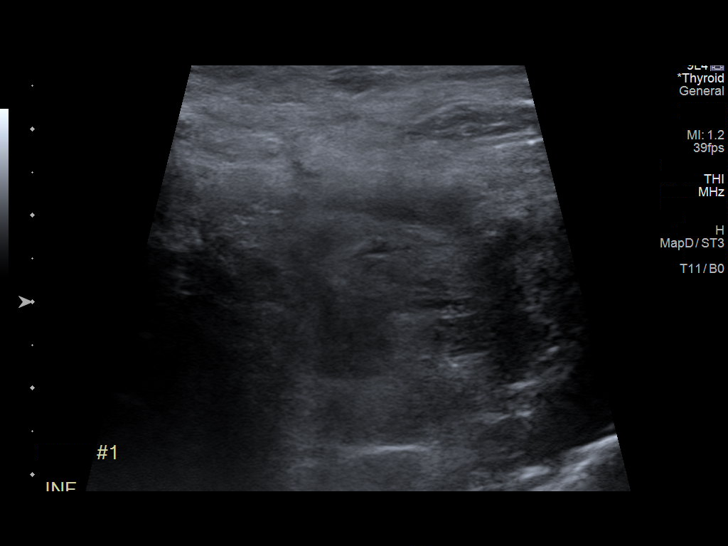
[im 13/19]
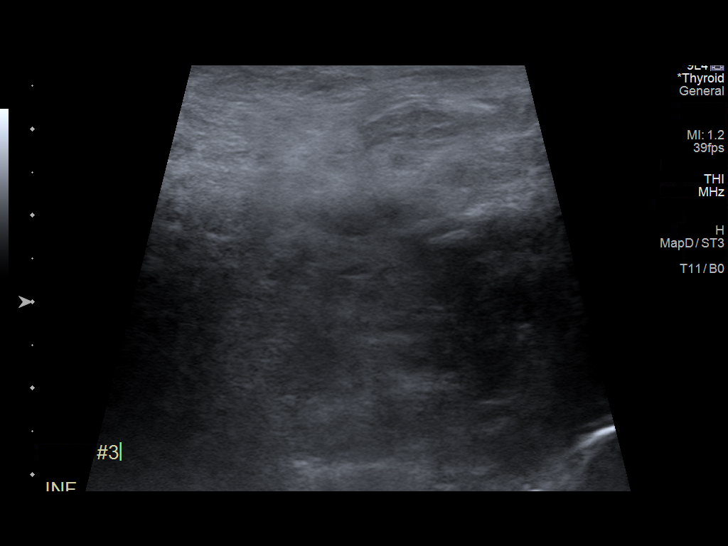
[im 14/19]
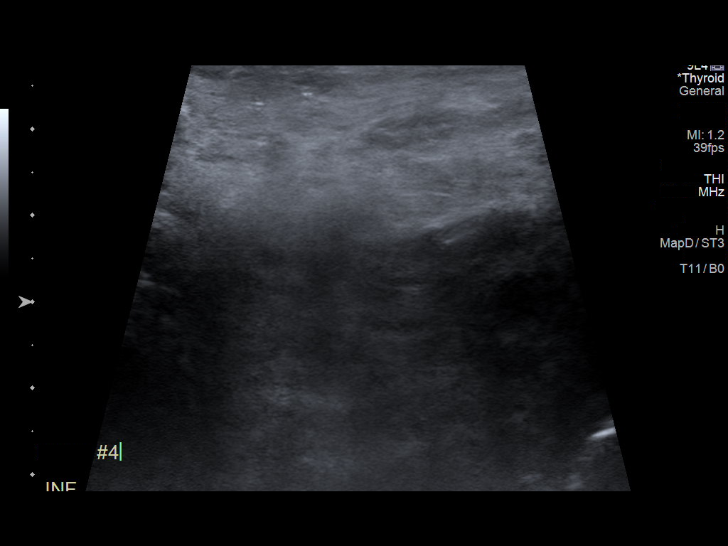
[im 16/19]
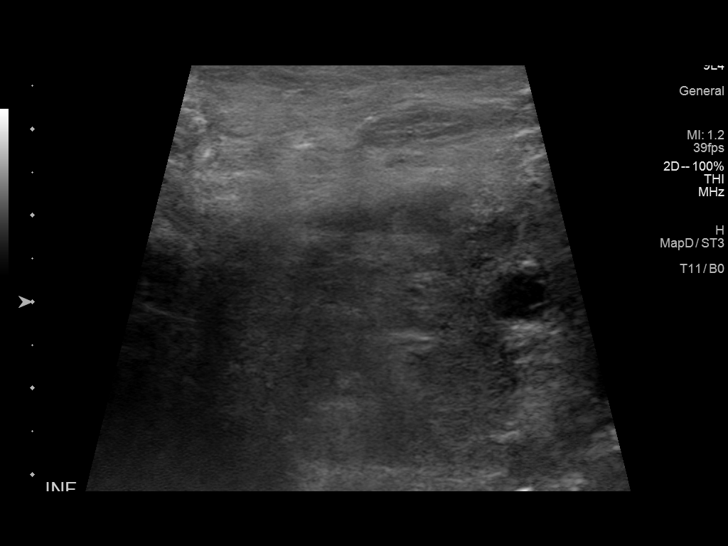
[im 17/19]
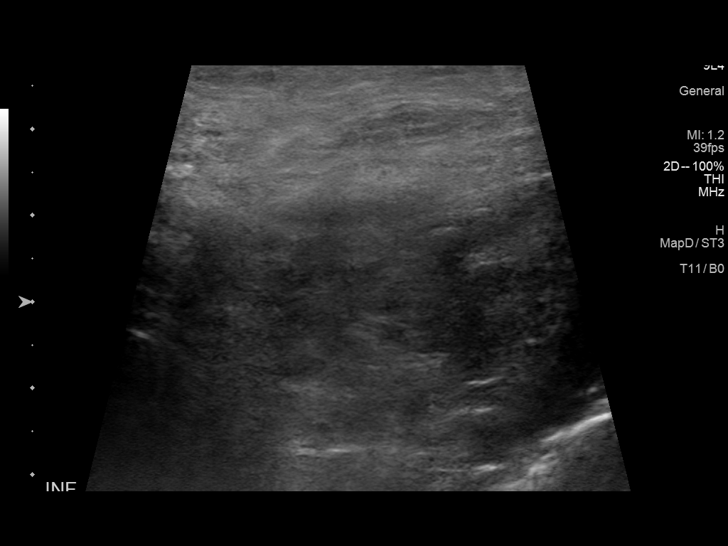
[im 19/19]
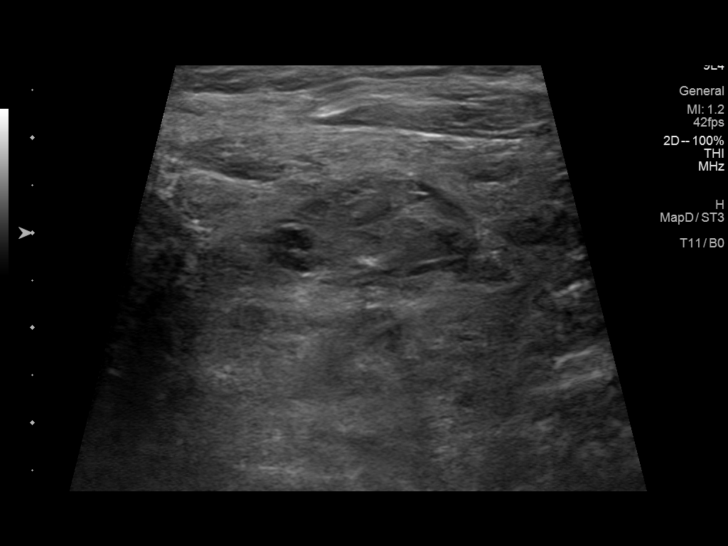

[13 of 19 positions shown; findings below may reference images not displayed]

Pre-procedural ultrasound scanning demonstrated unchanged size and
appearance of the indeterminate nodules within the left lobe

The procedure was planned. The neck was prepped in the usual sterile
fashion, and a sterile drape was applied covering the operative
field. A timeout was performed prior to the initiation of the
procedure. Local anesthesia was provided with 1% lidocaine.

Under direct ultrasound guidance, 5 FNA biopsies were performed of
the left middle lobe nodule with a 27 gauge needle. Multiple
ultrasound images were saved for procedural documentation purposes.
The samples were prepared and submitted to pathology. Two of these
samples were reserved for [facility]ir[facility].

Under direct ultrasound guidance, 5 FNA biopsies were performed of
the left inferior lobe nodule with a 27 gauge needle. Multiple
ultrasound images were saved for procedural documentation purposes.
The samples were prepared and submitted to pathology. Two of the
samples were also reserved for Afirma.

Limited post procedural scanning was negative for hematoma or
additional complication. Dressings were placed. The patient
tolerated the above procedures procedure well without immediate
postprocedural complication.
FINDINGS: Nodule reference number based on prior diagnostic ultrasound: 1

Maximum size: 2.9 cm

Location: Left; Mid

ACR TI-RADS risk category: TR 4

Reason for biopsy: meets ACR TI-RADS criteria

_________________________________________________________

Nodule reference number based on prior diagnostic ultrasound: 3

Maximum size: 2.6 cm

Location: Left; Inferior

ACR TI-RADS risk category: TR 4

Reason for biopsy: meets ACR TI-RADS criteria

Ultrasound imaging confirms appropriate placement of the needles
within the thyroid nodule.
IMPRESSION: Technically successful ultrasound guided fine needle aspiration of
TR-4 left thyroid nodules, as described above

Read by: Licun Lines, PA-C

## 2023-04-12 NOTE — Patient Instructions (Addendum)
It was wonderful to see you today! Thank you for choosing [facility].   Please bring ALL of your medications with you to every visit.   Today we talked about:  Your blood pressure looks good!  Please continue take your medications as prescribed. I referred you to the manage Medicaid social worker who can help you with housing instability.  They will reach out with a phone call to connect you with resources. I am referring you to GYN for further evaluation of the mass in your pelvis.  Our office will follow-up with that referral in the next 1 to 2 weeks.  You can also call to schedule with [facility] does accept her insurance.  Please see the information below: [facility] care at [facility] for women [address] [place],  [place]  [address] [phone]  Please follow up in 3 months   We are checking some labs today. If they are abnormal, I will call you. If they are normal, I will send you a MyChart message (if it is active) or a letter in the mail. If you do not hear about your labs in the next 2 weeks, please call the office.  Call the clinic at [phone] if your symptoms worsen or you have any concerns.  Please be sure to schedule follow up at the front desk before you leave today.   Elberta Fortis, DO Family Medicine    [facility] Marolyn Hammock DDS  [address] [place]   [phone]  Need to bring interpreter   [facility]   [address]  [place], [place] [address]  Phone: [phone]   Health Department  (two locations)   [facility]: [facility] at [address] [place], [place], [place] [address].For appointments and more information, call [phone].   [place]: [address] [place], [place] [address]. For appointments and more information, call [phone].

## 2023-04-15 ENCOUNTER — Telehealth: Payer: Self-pay

## 2023-04-15 ENCOUNTER — Telehealth: Payer: Self-pay | Admitting: *Deleted

## 2023-04-15 DIAGNOSIS — R110 Nausea: Secondary | ICD-10-CM

## 2023-04-15 MED ORDER — ONDANSETRON 4 MG PO TBDP
4.0000 mg | ORAL_TABLET | Freq: Three times a day (TID) | ORAL | 0 refills | Status: DC | PRN
Start: 2023-04-15 — End: 2023-07-19

## 2023-04-15 NOTE — Progress Notes (Signed)
Care Coordination   Note   04/15/2023 Name: Leacy Yasuda MRN: [id number] DOB: [birth year]  Karis Teegarden is a 67 y.o. year old female who sees Elberta Fortis, MD for primary care. I reached out to [name] by phone today to offer care coordination services.  Ms. Ipina was given information about Care Coordination services today including:   The Care Coordination services include support from the care team which includes your Nurse Coordinator, Clinical Social Worker, or Pharmacist.  The Care Coordination team is here to help remove barriers to the health concerns and goals most important to you. Care Coordination services are voluntary, and the patient may decline or stop services at any time by request to their care team member.   Care Coordination Consent Status: Patient agreed to services and verbal consent obtained.   Follow up plan:  Telephone appointment with care coordination team member scheduled for:  04/22/23  Encounter Outcome:  Patient Scheduled  [facility] Coordination Care Guide  Direct Dial: [phone]

## 2023-04-15 NOTE — Telephone Encounter (Signed)
Patient calls nurse line requesting to speak with PCP.   She reports she has been experiencing symptoms of nausea and diarrhea over the weekend.   She denies any vomiting, fevers or chills. She denies any abdominal pain or blood in her stool.   She is requesting Phenergan and something to help with the diarrhea.  Patient encouraged to stay well hydrated.   Precautions discussed.   Will forward to PCP for advisement.

## 2023-04-15 NOTE — Progress Notes (Signed)
Care Coordination  Outreach Note  04/15/2023 Name: Paizlie Kauffmann MRN: [id number] DOB: Jan 28, 1956   Care Coordination Outreach Attempts: An unsuccessful telephone outreach was attempted today to offer the patient information about available care coordination services.  Follow Up Plan:  Additional outreach attempts will be made to offer the patient care coordination information and services.   Encounter Outcome:  No Answer  [name]  Care Coordination Care Guide  Direct Dial: [phone]

## 2023-04-15 NOTE — Telephone Encounter (Signed)
Spoke with patient about nausea and diarrheal symptoms. Denies vomiting and fever. Continues to stay hydrated, drinking juice and water. No other sick contacts.  Advised she can take Zofran ODT 4mg  q8h prn N/V for symptom relief. Recommend she allow diarrhea to run its course but if persistent/bothersome symptoms can consider taking Imodium prn.   Clinic and ED return precautions discussed.  Elberta Fortis, DO

## 2023-04-22 ENCOUNTER — Ambulatory Visit: Payer: 59 | Admitting: Licensed Clinical Social Worker

## 2023-04-22 NOTE — Patient Outreach (Signed)
Care Coordination   04/22/2023 Name: Monica Watson MRN: [id number] DOB: September 17, 1955   Care Coordination Outreach Attempts:  An unsuccessful telephone outreach was attempted for a scheduled appointment today.  Follow Up Plan:  Additional outreach attempts will be made to offer the patient care coordination information and services.   Encounter Outcome:  No Answer   Care Coordination Interventions:  No, not indicated    Jeanie Cooks, PhD [facility], [facility] Social Worker Direct Dial: [phone]  Fax: [phone]

## 2023-04-25 ENCOUNTER — Ambulatory Visit: Payer: 59

## 2023-05-06 ENCOUNTER — Ambulatory Visit: Payer: 59

## 2023-05-06 VITALS — Ht 69.0 in | Wt 182.0 lb

## 2023-05-06 DIAGNOSIS — Z0000 Encounter for general adult medical examination without abnormal findings: Secondary | ICD-10-CM | POA: Diagnosis not present

## 2023-05-06 NOTE — Progress Notes (Signed)
Subjective:   Monica Watson is a 67 y.o. female who presents for an Initial Medicare Annual Wellness Visit.  Visit Complete: Virtual I connected with  Monica Watson on 05/06/23 by a audio enabled telemedicine application and verified that I am speaking with the correct person using two identifiers.  Patient Location: Home  Provider Location: Home Office  I discussed the limitations of evaluation and management by telemedicine. The patient expressed understanding and agreed to proceed.  Vital Signs: Because this visit was a virtual/telehealth visit, some criteria may be missing or patient reported. Any vitals not documented were not able to be obtained and vitals that have been documented are patient reported.  Cardiac Risk Factors include: advanced age (>90men, >64 women);diabetes mellitus;dyslipidemia;hypertension;sedentary lifestyle     Objective:    Today's Vitals   05/06/23 1513  Weight: 182 lb (82.6 kg)  Height: 5\' 9"  (1.753 m)   Body mass index is 26.88 kg/m.     05/06/2023    3:18 PM 03/31/2023    6:47 PM 02/20/2023   12:40 PM 02/20/2023    6:13 AM 02/12/2023    1:16 PM 01/28/2023   11:44 AM 01/24/2023    2:54 PM  Advanced Directives  Does Patient Have a Medical Advance Directive? No No No No No No No  Would patient like information on creating a medical advance directive? Yes (MAU/Ambulatory/Procedural Areas - Information given) No - Patient declined No - Patient declined No - Patient declined No - Patient declined No - Patient declined No - Patient declined    Current Medications (verified) Outpatient Encounter Medications as of 05/06/2023  Medication Sig   aspirin EC 81 MG tablet Take 1 tablet (81 mg total) by mouth daily. Swallow whole.   atorvastatin (LIPITOR) 40 MG tablet Take 40 mg by mouth daily.   Azelastine-Fluticasone 137-50 MCG/ACT SUSP Place 1 spray into the nose 2 (two) times daily. (Patient taking differently: Place 1 spray into the nose 2 (two) times  daily as needed (Sinuses).)   calcium-vitamin D (OSCAL WITH D) 500-5 MG-MCG tablet Take 1 tablet by mouth daily with breakfast.   ciclopirox (PENLAC) 8 % solution APPLY 1 COAT TO TOENAIL DAILY, REMOVE WEEKLY WITH POLISH REMOVER   clonazePAM (KLONOPIN) 0.25 MG disintegrating tablet Take 0.25 mg by mouth 2 (two) times daily as needed (restless legs).   gabapentin (NEURONTIN) 800 MG tablet Take 1 tablet (800 mg total) by mouth at bedtime.   losartan (COZAAR) 50 MG tablet Take 1 tablet (50 mg total) by mouth daily.   Menthol, Topical Analgesic, (ICY HOT EX) Apply 1 Application topically daily as needed (knee pain).   metFORMIN (GLUCOPHAGE-XR) 500 MG 24 hr tablet Take one tablet daily for a week. If you do well with that, increase to one tablet in the morning and one in the evening. (Patient taking differently: Take 500 mg by mouth daily.)   naloxone (NARCAN) nasal spray 4 mg/0.1 mL Place 0.4 mg into the nose daily as needed (opioid overdose).   omeprazole (PRILOSEC) 20 MG capsule TAKE ONE CAPSULE BY MOUTH ONCE DAILY FOR ACID REFLUX   ondansetron (ZOFRAN-ODT) 4 MG disintegrating tablet Take 1 tablet (4 mg total) by mouth every 8 (eight) hours as needed for nausea.   oxyCODONE-acetaminophen (PERCOCET) 10-325 MG tablet Take 1 tablet by mouth every 6 (six) hours as needed.   polyethylene glycol (MIRALAX) 17 g packet Take 17 g by mouth daily. (Patient taking differently: Take 17 g by mouth daily as needed for moderate  constipation.)   psyllium (METAMUCIL) 28 % packet Take 1 packet by mouth 2 (two) times daily. (Patient taking differently: Take 1 packet by mouth daily as needed (constipation).)   tiZANidine (ZANAFLEX) 4 MG tablet Take 1 tablet (4 mg total) by mouth every 8 (eight) hours as needed for muscle spasms. (Patient taking differently: Take 4 mg by mouth daily as needed for muscle spasms.)   XIIDRA 5 % SOLN Apply 1 drop to eye daily as needed (Dry eye).   [DISCONTINUED] ferrous sulfate (FEROSUL) 325  (65 FE) MG tablet Take 1 tablet (325 mg total) by mouth daily. (Patient not taking: No sig reported)   No facility-administered encounter medications on file as of 05/06/2023.    Allergies (verified) Patient has no known allergies.   History: Past Medical History:  Diagnosis Date   Arthritis    Cerebrovascular disease    Depression    GERD (gastroesophageal reflux disease)    Headache    Migraines   Hypertension    Stroke [facility]) 2010   Left sided weakness   Type 2 diabetes mellitus (HCC)    Past Surgical History:  Procedure Laterality Date   bilateral knee replacements      CATARACT EXTRACTION, BILATERAL     FRACTURE SURGERY Right    arm as a child   gallstones removed     I & D KNEE WITH POLY EXCHANGE Right 12/16/2020   Procedure: POLY EXCHANGE RIGHT KNEE;  Surgeon: Kathryne Hitch, MD;  Location: [facility] ORS;  Service: Orthopedics;  Laterality: Right;   KNEE ARTHROSCOPY Bilateral    left carpal tunnel release      PAROTIDECTOMY Left 02/20/2023   Procedure: SUPERFICIAL PAROTIDECTOMY WITH FACIAL NERVE MONITORING;  Surgeon: Scarlette Ar, MD;  Location: [facility] OR;  Service: ENT;  Laterality: Left;   REVERSE SHOULDER ARTHROPLASTY Left 04/23/2022   Procedure: LEFT REVERSE SHOULDER ARTHROPLASTY;  Surgeon: Huel Cote, MD;  Location: [facility] OR;  Service: Orthopedics;  Laterality: Left;   SHOULDER ARTHROSCOPY Bilateral    THYROIDECTOMY     Family History  Problem Relation Age of Onset   Colon cancer Neg Hx    Esophageal cancer Neg Hx    Stomach cancer Neg Hx    Social History   Socioeconomic History   Marital status: Single    Spouse name: Not on file   Number of children: 5   Years of education: Not on file   Highest education level: Not on file  Occupational History   Occupation: disability  Tobacco Use   Smoking status: Every Day    Current packs/day: 0.50    Average packs/day: 0.5 packs/day for 46.0 years (23.0 ttl pk-yrs)    Types: Cigarettes    Passive  exposure: Current   Smokeless tobacco: Never  Vaping Use   Vaping status: Never Used  Substance and Sexual Activity   Alcohol use: Never   Drug use: Never   Sexual activity: Yes  Other Topics Concern   Not on file  Social History Narrative   Retired    Right handed   Social Determinants of Health   Financial Resource Strain: Low Risk  (05/06/2023)   Overall Financial Resource Strain (CARDIA)    Difficulty of Paying Living Expenses: Not hard at all  Food Insecurity: No Food Insecurity (05/06/2023)   Hunger Vital Sign    Worried About Running Out of Food in the Last Year: Never true    Ran Out of Food in the Last Year: Never true  Transportation Needs: No Transportation Needs (05/06/2023)   PRAPARE - Administrator, Civil Service (Medical): No    Lack of Transportation (Non-Medical): No  Physical Activity: Inactive (05/06/2023)   Exercise Vital Sign    Days of Exercise per Week: 0 days    Minutes of Exercise per Session: 0 min  Stress: No Stress Concern Present (05/06/2023)   [employer] of Occupational Health - Occupational Stress Questionnaire    Feeling of Stress : Only a little  Social Connections: Socially Isolated (05/06/2023)   Social Connection and Isolation Panel [NHANES]    Frequency of Communication with Friends and Family: More than three times a week    Frequency of Social Gatherings with Friends and Family: Three times a week    Attends Religious Services: Never    Active Member of Clubs or Organizations: No    Attends Banker Meetings: Never    Marital Status: Never married    Tobacco Counseling Ready to quit: Not Answered Counseling given: Not Answered   Clinical Intake:  Pre-visit preparation completed: Yes  Pain : No/denies pain     Diabetes: Yes CBG done?: No Did pt. bring in CBG monitor from home?: No  How often do you need to have someone help you when you read instructions, pamphlets, or other written  materials from your doctor or pharmacy?: 1 - Never  Interpreter Needed?: No  Information entered by :: Kandis Fantasia LPN   Activities of Daily Living    05/06/2023    3:18 PM 04/01/2023    1:00 PM  In your present state of health, do you have any difficulty performing the following activities:  Hearing? 0 0  Vision? 0 0  Difficulty concentrating or making decisions? 0 1  Walking or climbing stairs? 1   Dressing or bathing? 0   Doing errands, shopping? 1 0  Preparing Food and eating ? N   Using the Toilet? N   In the past six months, have you accidently leaked urine? N   Do you have problems with loss of bowel control? N   Managing your Medications? N   Managing your Finances? N   Housekeeping or managing your Housekeeping? N     Patient Care Team: Elberta Fortis, MD as PCP - General (Family Medicine) Conley Rolls, [place], [place] as Referring Physician [facility] [address]) Center, [facility] [place], Rockey Situ, MD as Referring Physician (Internal Medicine) Center, [facility]  Indicate any recent Medical Services you may have received from other than [facility] providers in the past year (date may be approximate).     Assessment:   This is a routine wellness examination for Juline.  Hearing/Vision screen Hearing Screening - Comments:: Denies hearing difficulties   Vision Screening - Comments:: up to date with routine eye exams with Dr. Aletta Edouard      Goals Addressed             This Visit's Progress    Prevent falls        Depression Screen    05/06/2023    3:16 PM 04/12/2023    3:07 PM 03/29/2023    3:23 PM 01/24/2023    2:54 PM 01/09/2023   10:27 AM 10/08/2022    2:28 PM 08/27/2022    2:06 PM  PHQ 2/9 Scores  PHQ - 2 Score 3 3 3 3 2 3 3   PHQ- 9 Score  6 5 6 5 5 4     Fall Risk    05/06/2023  3:17 PM 03/29/2023    3:23 PM 01/24/2023    2:54 PM 01/09/2023   10:27 AM 10/08/2022    2:27 PM  Fall Risk   Falls in the past year? 0 0 0 0 0  Number falls in past yr:  0 0 0 0   Injury with Fall? 0  0 0   Risk for fall due to : No Fall Risks;Impaired balance/gait;Impaired mobility      Follow up Falls prevention discussed;Education provided;Falls evaluation completed        MEDICARE RISK AT HOME: Medicare Risk at Home Any stairs in or around the home?: No If so, are there any without handrails?: No Home free of loose throw rugs in walkways, pet beds, electrical cords, etc?: Yes Adequate lighting in your home to reduce risk of falls?: Yes Life alert?: No Use of a cane, walker or w/c?: Yes Grab bars in the bathroom?: Yes Shower chair or bench in shower?: No Elevated toilet seat or a handicapped toilet?: Yes  TIMED UP AND GO:  Was the test performed? No    Cognitive Function:        05/06/2023    3:18 PM  6CIT Screen  What Year? 0 points  What month? 0 points  What time? 0 points  Count back from 20 0 points  Months in reverse 0 points  Repeat phrase 0 points  Total Score 0 points    Immunizations Immunization History  Administered Date(s) Administered   Fluad Trivalent(High Dose 65+) 04/13/2015   Influenza,Quad,Nasal, Live 04/17/2017   Influenza,inj,Quad PF,6+ Mos 07/23/2018, 05/12/2020, 06/29/2021   PFIZER(Purple Top)SARS-COV-2 Vaccination 12/04/2019, 12/25/2019   PNEUMOCOCCAL CONJUGATE-20 01/09/2023   Pfizer(Comirnaty)Fall Seasonal Vaccine 12 years and older 03/29/2023   Tdap 11/23/2020    TDAP status: Up to date  Flu Vaccine status: Up to date  Pneumococcal vaccine status: Up to date  Covid-19 vaccine status: Information provided on how to obtain vaccines.   Qualifies for Shingles Vaccine? Yes   Zostavax completed No   Shingrix Completed?: No.    Education has been provided regarding the importance of this vaccine. Patient has been advised to call insurance company to determine out of pocket expense if they have not yet received this vaccine. Advised may also receive vaccine at local pharmacy or Health Dept. Verbalized  acceptance and understanding.  Screening Tests Health Maintenance  Topic Date Due   Zoster Vaccines- Shingrix (1 of 2) Never done   OPHTHALMOLOGY EXAM  11/09/2022   INFLUENZA VACCINE  01/24/2023   Lung Cancer Screening  03/08/2023   Diabetic kidney evaluation - Urine ACR  03/21/2023   COVID-19 Vaccine (4 - 2023-24 season) 05/24/2023   HEMOGLOBIN A1C  09/30/2023   FOOT EXAM  01/24/2024   MAMMOGRAM  03/13/2024   Diabetic kidney evaluation - eGFR measurement  04/03/2024   Medicare Annual Wellness (AWV)  05/05/2024   Colonoscopy  12/24/2027   DTaP/Tdap/Td (2 - Td or Tdap) 11/24/2030   Pneumonia Vaccine 58+ Years old  Completed   DEXA SCAN  Completed   Hepatitis C Screening  Completed   HPV VACCINES  Aged Out    Health Maintenance  Health Maintenance Due  Topic Date Due   Zoster Vaccines- Shingrix (1 of 2) Never done   OPHTHALMOLOGY EXAM  11/09/2022   INFLUENZA VACCINE  01/24/2023   Lung Cancer Screening  03/08/2023   Diabetic kidney evaluation - Urine ACR  03/21/2023    Colorectal cancer screening: Type of screening: Colonoscopy. Completed 12/23/17.  Repeat every 10 years  Mammogram status: Completed 03/13/22. Repeat every year  Bone Density status: Completed 07/06/22. Results reflect: Bone density results: OSTEOPENIA. Repeat every 2 years.  Lung Cancer Screening: (Low Dose CT Chest recommended if Age 36-80 years, 20 pack-year currently smoking OR have quit w/in 15years.) does qualify.   Lung Cancer Screening Referral: ordered 03/29/23  Additional Screening:  Hepatitis C Screening: does qualify; Completed 06/29/21  Vision Screening: Recommended annual ophthalmology exams for early detection of glaucoma and other disorders of the eye. Is the patient up to date with their annual eye exam?  Yes  Who is the provider or what is the name of the office in which the patient attends annual eye exams? Dr. Aletta Edouard  If pt is not established with a provider, would they like to be referred to  a provider to establish care? No .   Dental Screening: Recommended annual dental exams for proper oral hygiene  Diabetic Foot Exam: Diabetic Foot Exam: Completed 01/24/23  Community Resource Referral / Chronic Care Management: CRR required this visit?  No   CCM required this visit?  No     Plan:     I have personally reviewed and noted the following in the patient's chart:   Medical and social history Use of alcohol, tobacco or illicit drugs  Current medications and supplements including opioid prescriptions. Patient is currently taking opioid prescriptions. Information provided to patient regarding non-opioid alternatives. Patient advised to discuss non-opioid treatment plan with their provider. Functional ability and status Nutritional status Physical activity Advanced directives List of other physicians Hospitalizations, surgeries, and ER visits in previous 12 months Vitals Screenings to include cognitive, depression, and falls Referrals and appointments  In addition, I have reviewed and discussed with patient certain preventive protocols, quality metrics, and best practice recommendations. A written personalized care plan for preventive services as well as general preventive health recommendations were provided to patient.     Kandis Fantasia [place], [place]   [id number]   After Visit Summary: (Pick Up) Due to this being a telephonic visit, with patients personalized plan was offered to patient and patient has requested to Pick up at office.  Nurse Notes: No concerns at this time

## 2023-05-06 NOTE — Patient Instructions (Signed)
Ms. Eldreth , Thank you for taking time to come for your Medicare Wellness Visit. I appreciate your ongoing commitment to your health goals. Please review the following plan we discussed and let me know if I can assist you in the future.   Referrals/Orders/Follow-Ups/Clinician Recommendations: Aim for 30 minutes of exercise or brisk walking, 6-8 glasses of water, and 5 servings of fruits and vegetables each day.  This is a list of the screening recommended for you and due dates:  Health Maintenance  Topic Date Due   Zoster (Shingles) Vaccine (1 of 2) Never done   Eye exam for diabetics  11/09/2022   Flu Shot  01/24/2023   Screening for Lung Cancer  03/08/2023   Yearly kidney health urinalysis for diabetes  03/21/2023   COVID-19 Vaccine (4 - 2023-24 season) 05/24/2023   Hemoglobin A1C  09/30/2023   Complete foot exam   01/24/2024   Mammogram  03/13/2024   Yearly kidney function blood test for diabetes  04/03/2024   Medicare Annual Wellness Visit  05/05/2024   Colon Cancer Screening  12/24/2027   DTaP/Tdap/Td vaccine (2 - Td or Tdap) 11/24/2030   Pneumonia Vaccine  Completed   DEXA scan (bone density measurement)  Completed   Hepatitis C Screening  Completed   HPV Vaccine  Aged Out    Advanced directives: (ACP Link)Information on Advanced Care Planning can be found at [facility] [place] Advance Health Care Directives Advance Health Care Directives ([email or web address]   Next Medicare Annual Wellness Visit scheduled for next year: Yes

## 2023-05-08 ENCOUNTER — Ambulatory Visit: Payer: 59 | Admitting: Family Medicine

## 2023-05-09 ENCOUNTER — Other Ambulatory Visit: Payer: Self-pay

## 2023-05-09 ENCOUNTER — Ambulatory Visit: Payer: 59 | Admitting: Sports Medicine

## 2023-05-09 ENCOUNTER — Encounter: Payer: Self-pay | Admitting: Sports Medicine

## 2023-05-09 DIAGNOSIS — M25552 Pain in left hip: Secondary | ICD-10-CM

## 2023-05-09 DIAGNOSIS — M1612 Unilateral primary osteoarthritis, left hip: Secondary | ICD-10-CM

## 2023-05-09 DIAGNOSIS — M25559 Pain in unspecified hip: Secondary | ICD-10-CM

## 2023-05-09 MED ORDER — LIDOCAINE HCL 1 % IJ SOLN
4.0000 mL | INTRAMUSCULAR | Status: AC | PRN
Start: 2023-05-09 — End: 2023-05-09
  Administered 2023-05-09: 4 mL

## 2023-05-09 MED ORDER — METHYLPREDNISOLONE ACETATE 40 MG/ML IJ SUSP
2.0000 mL | INTRAMUSCULAR | Status: AC | PRN
Start: 2023-05-09 — End: 2023-05-09
  Administered 2023-05-09: 2 mL via INTRA_ARTICULAR

## 2023-05-09 NOTE — Progress Notes (Signed)
Office Visit Note   Patient: Monica Watson           Date of Birth: 07/30/1955           MRN: [id number] Visit Date: 05/09/2023              Requested by: Elberta Fortis, MD [address] [place],  [place] [address] PCP: Elberta Fortis, MD  Medical Resident, Sports Medicine Fellow - Attending Physician Addendum:   I have independently interviewed and examined the patient myself. I have discussed the above with the original author and agree with their documentation. My edits for correction/addition/clarification have been made, see any changes above and below.   In summary, very pleasant 67 year old female who presents with acute exacerbation of chronic left hip pain with advanced osteoarthritis.  Has had some mild tenderness over the lateral trochanteric with some weakness indicative of greater trochanteric pain syndrome which I think is more compensatory.  If she had decision-making, we did proceed with ultrasound-guided intra-articular hip injection, which has given her good relief in the past.  For pain control, she may continue her oxycodone-acetaminophen 10-325 mg every 6 hours as needed.  She also may continue gabapentin 800 mg daily.  She will follow-up with myself or Dr. Roda Shutters.  If she is not getting lasting relief, she may benefit from formalized physical therapy.  Madelyn Brunner, DO Primary Care Sports Medicine Physician  [facility] - Orthopedics   Assessment & Plan: Visit Diagnoses:  1. Unilateral primary osteoarthritis, left hip   2. Pain in left hip   3. Greater trochanteric pain syndrome    Plan: Patient's physical exam is consistent with hip osteoarthritis pain.  Patient's x-ray does show moderate to severe degenerative changes of the hip.  Given patient's previous improvement with injection, we will go ahead and do intra-articular left hip injection at this time.  Patient is also noted to have some greater trochanteric pain, could be secondary to bursitis.   Patient was advised to do some hip abduction exercises/possible physical therapy however patient has difficulty with transport at this time.  Patient also is on chronic pain medication which was reviewed and there is no cause for concern for interaction with current injection.  She may continue her oxycodone-acetaminophen 10-325 mg every 6 hours as needed.  She also may continue gabapentin 800 mg daily.  Patient tolerated the procedure well without any difficulty, patient to follow-up in 3 months for repeat injection if preferred or longer.  Follow-Up Instructions: PRN   Orders:  Orders Placed This Encounter  Procedures   Large Joint Inj: L hip joint   US Guided Needle Placement - No Linked Charges   No orders of the defined types were placed in this encounter.     Procedures: Large Joint Inj: L hip joint on 05/09/2023 1:42 PM Indications: pain Details: 22 G 3.5 in needle, ultrasound-guided anterior approach Medications: 2 mL methylPREDNISolone acetate 40 MG/ML; 4 mL lidocaine 1 % Outcome: tolerated well, no immediate complications  Procedure: US-guided intra-articular hip injection, Left After discussion on risks/benefits/indications and informed verbal consent was obtained, a timeout was performed. Patient was lying supine on exam table. The hip was cleaned with betadine and alcohol swabs. Then utilizing ultrasound guidance, the patient's femoral head and neck junction was identified and subsequently injected with 4:2 lidocaine:depomedrol via an in-plane approach with ultrasound visualization of the injectate administered into the hip joint. Patient tolerated procedure well without immediate complications.  Procedure, treatment alternatives, risks and  benefits explained, specific risks discussed. Consent was given by the patient and spouse. Immediately prior to procedure a time out was called to verify the correct patient, procedure, equipment, support staff and site/side marked as required.  Patient was prepped and draped in the usual sterile fashion.       Clinical Data: No additional findings.   Subjective: Chief Complaint  Patient presents with   Left Hip - Pain    Injection    Patient is presenting for follow-up after previous left hip injection.  Patient has known osteoarthritis of the left hip.  Patient had injection done previously in June of this year.  Patient states that she had significant relief of pain after the injection and that lasted approximately 3 to 4 weeks.  Patient states that she has been having increased pain and states that the pain radiates to her lateral thigh area and sometimes radiates down.  Patient denies any numbness tingling and states that it is more pain. Patient notes she is taking tizanidine, Percocet and gabapentin for her pain. Patient also notes some pain in the inner thigh/groin.  Patient otherwise has no other concerns at this time. Patient is here because she would like a repeat injection as she found good benefit from previous steroid.    Review of Systems   Objective:  Physical Exam  Hip, Left: No obvious rash, erythema, ecchymosis, or edema. Passive Log Roll with pain and restriction. ROM full in all directions; Strength is 5 out of 5 though there is some noted pain with hip abduction and hip flexion.Pelvic alignment unremarkable to inspection and palpation. Greater trochanter with tenderness to palpation. No tenderness over piriformis.  Provocative Testing:    - FABER/FADIR test: NEG/POS     Ortho Exam  Imaging:  - Hip XR 12/06/22: 2 view x-rays of the left hip shows moderately severe degenerative changes  of the hip joint with joint space narrowing and osteophytosis of the  acetabulum.    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   Osteoarthritis 02/19/2022   Abnormal uterine bleeding (AUB) 02/08/2022   Internal hemorrhoids 07/28/2021   Cervical lymphadenopathy 03/11/2021   Polyethylene wear of right knee prosthesis, 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/15/2020   Sialadenitis 11/23/2020   Abdominal pain 10/23/2020   Bartholin cyst 07/29/2020   History of total knee replacement, left 06/13/2020   Status post revision of total replacement of right knee 06/13/2020   Chronic back pain 06/09/2020   Vision changes 12/31/2019   Bilateral knee pain 10/08/2019   Poor dentition 10/08/2019   Healthcare maintenance 10/08/2019   Chronic left shoulder pain 08/31/2019   Hemorrhoids 04/04/2019   Pre-diabetes 01/01/2019   Hot flashes 01/01/2019   Onychomycosis 01/01/2019   Past Medical History:  Diagnosis Date   Arthritis    Cerebrovascular disease    Depression    GERD (gastroesophageal reflux disease)    Headache    Migraines   Hypertension    Stroke [facility]) 2010   Left sided weakness   Type 2 diabetes mellitus (HCC)     Family History  Problem Relation Age of Onset   Colon cancer Neg Hx    Esophageal cancer Neg Hx  Stomach cancer Neg Hx     Past Surgical History:  Procedure Laterality Date   bilateral knee replacements      CATARACT EXTRACTION, BILATERAL     FRACTURE SURGERY Right    arm as a child   gallstones removed     I & D KNEE WITH POLY EXCHANGE Right 12/16/2020   Procedure: POLY EXCHANGE RIGHT KNEE;  Surgeon: Kathryne Hitch, MD;  Location: [facility] ORS;  Service: Orthopedics;  Laterality: Right;   KNEE ARTHROSCOPY Bilateral    left carpal tunnel release      PAROTIDECTOMY Left 02/20/2023   Procedure: SUPERFICIAL PAROTIDECTOMY WITH FACIAL NERVE MONITORING;  Surgeon: Scarlette Ar, MD;  Location: [facility] OR;  Service: ENT;  Laterality: Left;   REVERSE SHOULDER  ARTHROPLASTY Left 04/23/2022   Procedure: LEFT REVERSE SHOULDER ARTHROPLASTY;  Surgeon: Huel Cote, MD;  Location: [facility] OR;  Service: Orthopedics;  Laterality: Left;   SHOULDER ARTHROSCOPY Bilateral    THYROIDECTOMY     Social History   Occupational History   Occupation: disability  Tobacco Use   Smoking status: Every Day    Current packs/day: 0.50    Average packs/day: 0.5 packs/day for 46.0 years (23.0 ttl pk-yrs)    Types: Cigarettes    Passive exposure: Current   Smokeless tobacco: Never  Vaping Use   Vaping status: Never Used  Substance and Sexual Activity   Alcohol use: Never   Drug use: Never   Sexual activity: Yes

## 2023-05-27 ENCOUNTER — Telehealth: Payer: Self-pay | Admitting: *Deleted

## 2023-05-27 NOTE — Progress Notes (Signed)
  Care Coordination Note  05/27/2023 Name: Monica Watson MRN: [id number] DOB: 01-21-1956  [name] is a 67 y.o. year old female who is a primary care patient of Elberta Fortis, MD and is actively engaged with the care management team. I reached out to [name] by phone today to assist with re-scheduling an initial visit with the BSW  Follow up plan: Telephone appointment with care management team member scheduled for:06/07/23  [facility] Coordination Care Guide  Direct Dial: [phone]

## 2023-05-27 NOTE — Progress Notes (Signed)
  Care Coordination Note  05/27/2023 Name: Monica Watson MRN: [id number] DOB: 08-13-1955  [name] is a 67 y.o. year old female who is a primary care patient of Elberta Fortis, MD and is actively engaged with the care management team. I reached out to [name] by phone today to assist with re-scheduling an initial visit with the BSW  Follow up plan: Unsuccessful telephone outreach attempt made. A HIPAA compliant phone message was left for the patient providing contact information and requesting a return call.   [facility]  Care Coordination Care Guide  Direct Dial: [phone]

## 2023-06-03 ENCOUNTER — Ambulatory Visit (INDEPENDENT_AMBULATORY_CARE_PROVIDER_SITE_OTHER): Payer: 59 | Admitting: Sports Medicine

## 2023-06-03 ENCOUNTER — Encounter: Payer: Self-pay | Admitting: Sports Medicine

## 2023-06-03 DIAGNOSIS — M5441 Lumbago with sciatica, right side: Secondary | ICD-10-CM | POA: Diagnosis not present

## 2023-06-03 DIAGNOSIS — M1612 Unilateral primary osteoarthritis, left hip: Secondary | ICD-10-CM | POA: Diagnosis not present

## 2023-06-03 DIAGNOSIS — G8929 Other chronic pain: Secondary | ICD-10-CM

## 2023-06-03 DIAGNOSIS — M5442 Lumbago with sciatica, left side: Secondary | ICD-10-CM

## 2023-06-03 DIAGNOSIS — M79605 Pain in left leg: Secondary | ICD-10-CM

## 2023-06-03 NOTE — Progress Notes (Signed)
Monica Watson - 67 y.o. female MRN [id number]  Date of birth: 30-Jan-1956  Office Visit Note: Visit Date: 06/03/2023 PCP: Elberta Fortis, MD Referred by: Elberta Fortis, MD  Subjective: Chief Complaint  Patient presents with   Left Hip - Pain    Injection   HPI: Monica Watson is a pleasant 67 y.o. female who presents today for follow-up of left hip and back with left leg pain.  She has moderate to severe left hip osteoarthritis and moderate right side based on previous x-rays.  Back in June of this past year we did perform ultrasound-guided intra-articular hip injection which gave her partial relief for a few months.  Most recently she had an injection performed in the left hip 1 month ago and states this did not provide her any benefit.  She continues with pain in the back that starts around the buttock and will go down from the hip past the knee.  She has tingling in the inner and anterior half of the thigh but more of a chronic aching pain that radiates down the lateral leg to the foot.  She is followed by pain management, and tells me today they had an MRI that she had obtained.  The only MRI I can see in the system is of the pelvis from 04/03/2023. Has Lumbar MRI ordered by Chiquita Loth, PA.  She has seen Dr. August Saucer in the past, last note reviewed on 09/09/2020 with her trying to order an MRI of the lumbar spine.  For pain control, she is on Percocet 10-325 mg every 6 hours as needed which she gets from pain management. Has also used gabapentin and tizanidine in the past.  Pertinent ROS were reviewed with the patient and found to be negative unless otherwise specified above in HPI.   Assessment & Plan: Visit Diagnoses:  1. Unilateral primary osteoarthritis, left hip   2. Chronic bilateral low back pain with bilateral sciatica   3. Pain in left leg    Plan: Discussed with Tristian the nature of her left hip/leg and low back pain.  She has had two previous ultrasound-guided  hip injections, the first gave her partial relief for a few months, this most recent 1 did not provide her benefit.  She does have moderate to severe hip arthritis, but has had intermittent pain going down both legs over the years and I think more of her pain is coming from her back.  She does follow with pain management for her low back, it does appear an MRI of the lumbar spine is trying to be obtained.  I recommended she continue her follow-up with them and proceed with this and associated treatment.  In terms of her left hip, she will follow-up with Dr. Roda Shutters to evaluate this and see if hip replacement would be advisable or would hold and treat the lumbar spine first.  She will continue her oxycodone-acetaminophen 10-325 mg every 6 hours as needed that she gets through pain management.   She did inquire about repeating hip injection today, it has only been 1 month.  Discussed we cannot repeat these sooner than 3 months apart.  This could be an option down the road both for diagnostic and therapeutic purposes moving forward, we will leave this up to Dr. Roda Shutters.  Follow-up: Return for with Dr. Roda Shutters for L-hip OA/pain and pain management for back/LLE.   Meds & Orders: No orders of the defined types were placed in this encounter.  No orders of the defined  types were placed in this encounter.    Procedures: No procedures performed      Clinical History: No specialty comments available.  She reports that she has been smoking cigarettes. She has a 23 pack-year smoking history. She has been exposed to tobacco smoke. She has never used smokeless tobacco.  Recent Labs    10/08/22 1440 01/24/23 1450 04/01/23 1422  HGBA1C 6.2 6.2 5.9*    Objective:    Physical Exam  Gen: Well-appearing, in no acute distress; non-toxic CV: Well-perfused. Warm.  Resp: Breathing unlabored on room air; no wheezing. Psych: Fluid speech in conversation; appropriate affect; normal thought process Neuro: Sensation intact  throughout. No gross coordination deficits.   Ortho Exam - Lumbar: No significant straight leg raise on the back.  There is pain with endrange flexion and extension of the low spine.  - Left hip: No greater trochanter TTP bilaterally.  There is mild restriction and associated pain with FADIR testing but somewhat equivocally on both sides.  There is weakness with hip flexion.  Imaging:  MR PELVIS W WO CONTRAST CLINICAL DATA:  Right adnexal mass on CT and ultrasound  EXAM: MRI PELVIS WITHOUT AND WITH CONTRAST  TECHNIQUE: Multiplanar multisequence MR imaging of the pelvis was performed both before and after administration of intravenous contrast.  CONTRAST:  9mL GADAVIST GADOBUTROL 1 MMOL/ML IV SOLN  COMPARISON:  CT abdomen pelvis dated 03/31/2023. Pelvic ultrasound dated 04/12/2023.  FINDINGS: Urinary Tract:  Bladder is within normal limits.  Bowel:  Visualized bowel is unremarkable.  Vascular/Lymphatic: No evidence of aneurysm.  No suspicious pelvic lymphadenopathy.  Reproductive: Uterus is notable for small uterine fibroids measuring up to 17 mm in the right anterior uterine body.  Bilateral ovaries are within normal limits (series 7/images 10-11).  4.0 cm right adnexal mass (series 7/image 17), low on T2, with heterogeneous/stippled enhancement following contrast administration. The appearance/location favors a broad ligament fibroid over an ovarian fibroma/fibrothecoma.  Other:  Trace pelvic ascites.  Musculoskeletal: No focal osseous lesions.  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  Small uterine fibroids.  Electronically Signed   By: Charline Bills M.D.   On: 04/04/2023 00:04  2 view x-rays of the left hip shows moderately severe degenerative changes  of the hip joint with joint space narrowing and osteophytosis of the  acetabulum.   Past Medical/Family/Surgical/Social  History: Medications & Allergies reviewed per EMR, new medications updated. 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   Osteoarthritis 02/19/2022   Abnormal uterine bleeding (AUB) 02/08/2022   Internal hemorrhoids 07/28/2021   Cervical lymphadenopathy 03/11/2021   Polyethylene wear of right knee prosthesis, 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/15/2020   Sialadenitis 11/23/2020   Abdominal pain 10/23/2020   Bartholin cyst 07/29/2020   History of total knee replacement, left 06/13/2020   Status post revision of total replacement of right knee 06/13/2020   Chronic back pain 06/09/2020   Vision changes 12/31/2019   Bilateral knee pain 10/08/2019   Poor dentition 10/08/2019   Healthcare maintenance 10/08/2019   Chronic left shoulder pain 08/31/2019   Hemorrhoids 04/04/2019   Pre-diabetes 01/01/2019   Hot flashes 01/01/2019   Onychomycosis 01/01/2019   Past Medical  History:  Diagnosis Date   Arthritis    Cerebrovascular disease    Depression    GERD (gastroesophageal reflux disease)    Headache    Migraines   Hypertension    Stroke [facility]) 2010   Left sided weakness   Type 2 diabetes mellitus (HCC)    Family History  Problem Relation Age of Onset   Colon cancer Neg Hx    Esophageal cancer Neg Hx    Stomach cancer Neg Hx    Past Surgical History:  Procedure Laterality Date   bilateral knee replacements      CATARACT EXTRACTION, BILATERAL     FRACTURE SURGERY Right    arm as a child   gallstones removed     I & D KNEE WITH POLY EXCHANGE Right 12/16/2020   Procedure: POLY EXCHANGE RIGHT KNEE;  Surgeon: Kathryne Hitch, MD;  Location: [facility];  Service: Orthopedics;   Laterality: Right;   KNEE ARTHROSCOPY Bilateral    left carpal tunnel release      PAROTIDECTOMY Left 02/20/2023   Procedure: SUPERFICIAL PAROTIDECTOMY WITH FACIAL NERVE MONITORING;  Surgeon: Scarlette Ar, MD;  Location: [facility] OR;  Service: ENT;  Laterality: Left;   REVERSE SHOULDER ARTHROPLASTY Left 04/23/2022   Procedure: LEFT REVERSE SHOULDER ARTHROPLASTY;  Surgeon: Huel Cote, MD;  Location: [facility] OR;  Service: Orthopedics;  Laterality: Left;   SHOULDER ARTHROSCOPY Bilateral    THYROIDECTOMY     Social History   Occupational History   Occupation: disability  Tobacco Use   Smoking status: Every Day    Current packs/day: 0.50    Average packs/day: 0.5 packs/day for 46.0 years (23.0 ttl pk-yrs)    Types: Cigarettes    Passive exposure: Current   Smokeless tobacco: Never  Vaping Use   Vaping status: Never Used  Substance and Sexual Activity   Alcohol use: Never   Drug use: Never   Sexual activity: Yes

## 2023-06-03 NOTE — Progress Notes (Signed)
Patient says that she did not get relief in the left hip from the last injection. She says that she has pain on the side of the hip that goes down the leg and stops at the foot. She says that it is dull and sometimes sharp. Patient says that it is also tender to touch.

## 2023-06-07 ENCOUNTER — Ambulatory Visit: Payer: Self-pay | Admitting: Licensed Clinical Social Worker

## 2023-06-07 NOTE — Patient Outreach (Signed)
  Care Coordination   06/07/2023 Name: Monica Watson MRN: [id number] DOB: 1956/03/09   Care Coordination Outreach Attempts:  A second unsuccessful outreach was attempted today to offer the patient with information about available care coordination services.  Follow Up Plan:  Additional outreach attempts will be made to offer the patient care coordination information and services.   Encounter Outcome:  No Answer   Care Coordination Interventions:  No, not indicated    Jeanie Cooks, PhD [facility], [facility] Social Worker Direct Dial: [phone]  Fax: [phone]

## 2023-06-11 ENCOUNTER — Encounter: Payer: Self-pay | Admitting: Orthopaedic Surgery

## 2023-06-11 ENCOUNTER — Ambulatory Visit (INDEPENDENT_AMBULATORY_CARE_PROVIDER_SITE_OTHER): Payer: 59 | Admitting: Orthopaedic Surgery

## 2023-06-11 ENCOUNTER — Other Ambulatory Visit (INDEPENDENT_AMBULATORY_CARE_PROVIDER_SITE_OTHER): Payer: 59

## 2023-06-11 DIAGNOSIS — M1612 Unilateral primary osteoarthritis, left hip: Secondary | ICD-10-CM

## 2023-06-11 NOTE — Progress Notes (Addendum)
Office Visit Note   Patient: Monica Watson           Date of Birth: Sep 05, 1955           MRN: [id number] Visit Date: 06/11/2023              Requested by: Elberta Fortis, MD [address] [place],  [place] [address] PCP: Elberta Fortis, MD   Assessment & Plan: Visit Diagnoses:  1. Primary osteoarthritis of left hip     Plan: Impression is severe left hip degenerative joint disease secondary to Osteoarthritis.  Imaging shows bone on bone joint space narrowing.  At this point, conservative treatments fail to provide any significant relief and the pain is severely affecting ADLs and quality of life.  Based on treatment options, the patient has elected to move forward with a hip replacement.  We have discussed the surgical risks that include but are not limited to infection, DVT, leg length discrepancy, numbness, tingling, incomplete relief of pain.  Recovery and prognosis were also reviewed.    Monica Watson has had 2 prior cortisone injections and the most recent 1 has not provided any relief.  Eunice Blase will call the patient to confirm surgery date for after August 09, 2023 due to the cortisone injection.  Current anticoagulants: aspirin 81 mg daily Postop anticoagulation: Aspirin 81 mg Diabetic: Yes  Prior DVT/PE: No Tobacco use: No Clearances needed for surgery: Elberta Fortis Anticipate discharge dispo: home with daughter  Follow-Up Instructions: No follow-ups on file.   Orders:  Orders Placed This Encounter  Procedures   XR HIP UNILAT W OR W/O PELVIS 2-3 VIEWS LEFT   No orders of the defined types were placed in this encounter.     Procedures: No procedures performed   Clinical Data: No additional findings.   Subjective: Chief Complaint  Patient presents with   Left Hip - Pain    HPI Monica Watson returns today for follow-up evaluation of chronic left hip pain from advanced DJD.  Monica Watson has had 2 cortisone injections this year and the most recent one in November did  not provide any relief.  Monica Watson would like to explore other options. Review of Systems  Constitutional: Negative.   HENT: Negative.    Eyes: Negative.   Respiratory: Negative.    Cardiovascular: Negative.   Endocrine: Negative.   Musculoskeletal:  Positive for arthralgias and gait problem.  Hematological: Negative.   Psychiatric/Behavioral: Negative.    All other systems reviewed and are negative.    Objective: Vital Signs: There were no vitals taken for this visit.  Physical Exam Vitals and nursing note reviewed.  Constitutional:      Appearance: Monica Watson is well-developed.  HENT:     Head: Atraumatic.     Nose: Nose normal.  Eyes:     Extraocular Movements: Extraocular movements intact.  Cardiovascular:     Pulses: Normal pulses.  Pulmonary:     Effort: Pulmonary effort is normal.  Abdominal:     Palpations: Abdomen is soft.  Musculoskeletal:     Cervical back: Neck supple.  Skin:    General: Skin is warm.     Capillary Refill: Capillary refill takes less than 2 seconds.  Neurological:     Mental Status: Monica Watson is alert. Mental status is at baseline.  Psychiatric:        Behavior: Behavior normal.        Thought Content: Thought content normal.        Judgment: Judgment normal.  Ortho Exam Exam of the left hip shows antalgic gait and pain with movement of the hip joint.  Limited range of motion secondary to pain. Specialty Comments:  No specialty comments available.  Imaging: XR HIP UNILAT W OR W/O PELVIS 2-3 VIEWS LEFT Result Date: 06/11/2023 X-rays of the left hip show advanced degenerative joint disease with bone-on-bone joint space narrowing.    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   Osteoarthritis 02/19/2022   Abnormal uterine bleeding (AUB) 02/08/2022   Internal hemorrhoids 07/28/2021   Cervical lymphadenopathy 03/11/2021   Polyethylene wear of right knee prosthesis, 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/15/2020   Sialadenitis 11/23/2020   Abdominal pain 10/23/2020   Bartholin cyst 07/29/2020   History of total knee replacement, left 06/13/2020   Status post revision of total replacement of right knee 06/13/2020   Chronic back pain 06/09/2020   Vision changes 12/31/2019   Bilateral knee pain 10/08/2019   Poor dentition 10/08/2019   Healthcare maintenance 10/08/2019   Chronic left shoulder pain 08/31/2019   Hemorrhoids 04/04/2019   Pre-diabetes 01/01/2019   Hot flashes 01/01/2019   Onychomycosis 01/01/2019   Past Medical History:  Diagnosis Date   Arthritis    Cerebrovascular disease    Depression    GERD (gastroesophageal reflux disease)    Headache    Migraines   Hypertension    Stroke (HCC) 2010   Left sided weakness   Type 2 diabetes mellitus (HCC)     Family History  Problem Relation Age of Onset   Colon cancer Neg Hx    Esophageal cancer Neg Hx    Stomach cancer Neg Hx     Past Surgical History:  Procedure Laterality Date   bilateral knee replacements      CATARACT EXTRACTION, BILATERAL     FRACTURE SURGERY Right    arm as a child   gallstones removed     I & D KNEE WITH POLY EXCHANGE Right 12/16/2020   Procedure: POLY EXCHANGE RIGHT KNEE;  Surgeon: Kathryne Hitch, MD;  Location: [facility] ORS;  Service: Orthopedics;  Laterality: Right;   KNEE ARTHROSCOPY Bilateral    left carpal tunnel release      PAROTIDECTOMY Left 02/20/2023   Procedure: SUPERFICIAL PAROTIDECTOMY WITH FACIAL NERVE MONITORING;  Surgeon: Scarlette Ar, MD;  Location: [facility] OR;  Service: ENT;  Laterality: Left;   REVERSE SHOULDER ARTHROPLASTY Left 04/23/2022   Procedure: LEFT REVERSE SHOULDER ARTHROPLASTY;  Surgeon: Huel Cote, MD;  Location: [facility] OR;  Service: Orthopedics;  Laterality: Left;   SHOULDER ARTHROSCOPY Bilateral    THYROIDECTOMY     Social History   Occupational History   Occupation: disability  Tobacco Use   Smoking status: Every Day    Current packs/day: 0.50    Average packs/day: 0.5 packs/day for 46.0 years (23.0 ttl pk-yrs)    Types: Cigarettes    Passive exposure: Current   Smokeless tobacco: Never  Vaping Use   Vaping status: Never Used  Substance and Sexual Activity   Alcohol use: Never   Drug use: Never   Sexual activity: Yes

## 2023-07-17 DIAGNOSIS — I10 Essential (primary) hypertension: Secondary | ICD-10-CM | POA: Diagnosis not present

## 2023-07-17 DIAGNOSIS — F17210 Nicotine dependence, cigarettes, uncomplicated: Secondary | ICD-10-CM | POA: Diagnosis not present

## 2023-07-17 DIAGNOSIS — Z79899 Other long term (current) drug therapy: Secondary | ICD-10-CM | POA: Diagnosis not present

## 2023-07-17 DIAGNOSIS — M25552 Pain in left hip: Secondary | ICD-10-CM | POA: Diagnosis not present

## 2023-07-17 DIAGNOSIS — E559 Vitamin D deficiency, unspecified: Secondary | ICD-10-CM | POA: Diagnosis not present

## 2023-07-17 DIAGNOSIS — M5416 Radiculopathy, lumbar region: Secondary | ICD-10-CM | POA: Diagnosis not present

## 2023-07-17 DIAGNOSIS — Z780 Asymptomatic menopausal state: Secondary | ICD-10-CM | POA: Diagnosis not present

## 2023-07-19 ENCOUNTER — Emergency Department (HOSPITAL_COMMUNITY): Payer: 59

## 2023-07-19 ENCOUNTER — Other Ambulatory Visit: Payer: Self-pay

## 2023-07-19 ENCOUNTER — Emergency Department (HOSPITAL_COMMUNITY)
Admission: EM | Admit: 2023-07-19 | Discharge: 2023-07-20 | Disposition: A | Discharge status: Home / Self Care | Payer: 59 | Attending: Emergency Medicine | Admitting: Emergency Medicine

## 2023-07-19 ENCOUNTER — Telehealth: Payer: Self-pay | Admitting: *Deleted

## 2023-07-19 ENCOUNTER — Encounter (HOSPITAL_COMMUNITY): Payer: Self-pay | Admitting: Emergency Medicine

## 2023-07-19 DIAGNOSIS — R109 Unspecified abdominal pain: Secondary | ICD-10-CM | POA: Diagnosis not present

## 2023-07-19 DIAGNOSIS — K8689 Other specified diseases of pancreas: Secondary | ICD-10-CM | POA: Diagnosis not present

## 2023-07-19 DIAGNOSIS — R112 Nausea with vomiting, unspecified: Secondary | ICD-10-CM

## 2023-07-19 DIAGNOSIS — Z96612 Presence of left artificial shoulder joint: Secondary | ICD-10-CM | POA: Diagnosis not present

## 2023-07-19 DIAGNOSIS — K429 Umbilical hernia without obstruction or gangrene: Secondary | ICD-10-CM | POA: Diagnosis not present

## 2023-07-19 DIAGNOSIS — I10 Essential (primary) hypertension: Secondary | ICD-10-CM | POA: Diagnosis not present

## 2023-07-19 DIAGNOSIS — N138 Other obstructive and reflux uropathy: Secondary | ICD-10-CM | POA: Diagnosis not present

## 2023-07-19 DIAGNOSIS — E119 Type 2 diabetes mellitus without complications: Secondary | ICD-10-CM | POA: Insufficient documentation

## 2023-07-19 DIAGNOSIS — Z96653 Presence of artificial knee joint, bilateral: Secondary | ICD-10-CM | POA: Insufficient documentation

## 2023-07-19 DIAGNOSIS — A084 Viral intestinal infection, unspecified: Secondary | ICD-10-CM | POA: Diagnosis not present

## 2023-07-19 DIAGNOSIS — Z743 Need for continuous supervision: Secondary | ICD-10-CM | POA: Diagnosis not present

## 2023-07-19 DIAGNOSIS — K5750 Diverticulosis of both small and large intestine without perforation or abscess without bleeding: Secondary | ICD-10-CM | POA: Diagnosis not present

## 2023-07-19 LAB — URINALYSIS, ROUTINE W REFLEX MICROSCOPIC
Bilirubin Urine: NEGATIVE
Glucose, UA: NEGATIVE mg/dL
Hgb urine dipstick: NEGATIVE
Ketones, ur: NEGATIVE mg/dL
Nitrite: NEGATIVE
Protein, ur: NEGATIVE mg/dL
Specific Gravity, Urine: 1.028 (ref 1.005–1.030)
pH: 5 (ref 5.0–8.0)

## 2023-07-19 LAB — COMPREHENSIVE METABOLIC PANEL
ALT: 31 U/L (ref 0–44)
AST: 22 U/L (ref 15–41)
Albumin: 4.3 g/dL (ref 3.5–5.0)
Alkaline Phosphatase: 50 U/L (ref 38–126)
Anion gap: 15 (ref 5–15)
BUN: 19 mg/dL (ref 8–23)
CO2: 21 mmol/L — ABNORMAL LOW (ref 22–32)
Calcium: 9.8 mg/dL (ref 8.9–10.3)
Chloride: 103 mmol/L (ref 98–111)
Creatinine, Ser: 0.86 mg/dL (ref 0.44–1.00)
GFR, Estimated: >60 mL/min (ref >60)
Glucose, Bld: 140 mg/dL — ABNORMAL HIGH (ref 70–99)
Potassium: 3.7 mmol/L (ref 3.5–5.1)
Sodium: 139 mmol/L (ref 135–145)
Total Bilirubin: 1.4 mg/dL — ABNORMAL HIGH (ref 0.0–1.2)
Total Protein: 7.6 g/dL (ref 6.5–8.1)

## 2023-07-19 LAB — CBC
HCT: 45.4 % (ref 36.0–46.0)
Hemoglobin: 15.3 g/dL — ABNORMAL HIGH (ref 12.0–15.0)
MCH: 31.2 pg (ref 26.0–34.0)
MCHC: 33.7 g/dL (ref 30.0–36.0)
MCV: 92.5 fL (ref 80.0–100.0)
Platelets: 262 10*3/uL (ref 150–400)
RBC: 4.91 MIL/uL (ref 3.87–5.11)
RDW: 12.2 % (ref 11.5–15.5)
WBC: 5.6 10*3/uL (ref 4.0–10.5)
nRBC: 0 % (ref 0.0–0.2)

## 2023-07-19 LAB — LIPASE, BLOOD: Lipase: 31 U/L (ref 11–51)

## 2023-07-19 MED ORDER — ONDANSETRON 4 MG PO TBDP
4.0000 mg | ORAL_TABLET | Freq: Once | ORAL | Status: AC
  Administered 2023-07-19: 4 mg via ORAL
  Filled 2023-07-19: qty 1

## 2023-07-19 MED ORDER — LACTATED RINGERS IV BOLUS
1000.0000 mL | Freq: Once | INTRAVENOUS | Status: AC
  Administered 2023-07-19: 1000 mL via INTRAVENOUS

## 2023-07-19 MED ORDER — IOHEXOL 350 MG/ML SOLN
75.0000 mL | Freq: Once | INTRAVENOUS | Status: AC | PRN
  Administered 2023-07-19: 75 mL via INTRAVENOUS

## 2023-07-19 MED ORDER — ONDANSETRON HCL 4 MG/2ML IJ SOLN
4.0000 mg | Freq: Once | INTRAMUSCULAR | Status: AC
  Administered 2023-07-19: 4 mg via INTRAVENOUS
  Filled 2023-07-19: qty 2

## 2023-07-19 MED ORDER — FAMOTIDINE IN NACL 20-0.9 MG/50ML-% IV SOLN
20.0000 mg | Freq: Once | INTRAVENOUS | Status: AC
  Administered 2023-07-19: 20 mg via INTRAVENOUS
  Filled 2023-07-19: qty 50

## 2023-07-19 MED ORDER — ONDANSETRON 4 MG PO TBDP
4.0000 mg | ORAL_TABLET | Freq: Three times a day (TID) | ORAL | 0 refills | Status: DC | PRN
Start: 2023-07-19 — End: 2023-09-02

## 2023-07-19 NOTE — ED Provider Notes (Signed)
[facility] DE[facility]MENT AT [facility] Provider Note  MDM   HPI/ROS:  Monica Watson is a 68 y.o. female with pertinent past medical history of type 2 diabetes, left hip pain, chronic back pain, prediabetes who presents for eval patient with abdominal pain, vomiting, diarrhea.  Patient reports onset of primarily upper abdominal pain yesterday with several episodes of associated nonbloody nonbilious emesis and nonbloody diarrhea.  She notes associated subjective fevers and chills but has not measured a temperature.  She denies any urinary symptoms, chest pain, dyspnea.  She has not tolerated much p.o. intake.  She denies any previous abdominal surgeries.  Physical exam is notable for: - Diffuse mild tenderness palpation was severe in the right lower quadrant and epigastric region -- Negative Murphy sign. -- Dry mucous membranes -- No CVA tenderness palpation  On my initial evaluation, patient is:  -Vital signs stable. Patient afebrile, hemodynamically stable, and non-toxic appearing. -Labs reviewed: WBC 5.6, hemoglobin 15.3, creatinine 0.86, CO2 21, AG 15, T. bili 1.4, normal electrolytes  This patient's current presentation, including their history and physical exam, is most consistent with viral gastroenteritis.  Lower suspicion for cholecystitis, pancreatitis, appendicitis, UTI/pyelonephritis, nephrolithiasis based on history, exam, labs as above.  UA was obtained and showed moderate leukocytes and rare bacteria, overall unlikely UTI as patient asymptomatic, however will send urine culture.  CT was obtained and demonstrated no acute pathology.  On reevaluation, patient continues to be nontoxic-appearing with no further episodes of emesis.  Plan to discharge home with Zofran and PCP follow-up.  She was able to tolerate water prior to time of discharge.  Patient remained stable and had no acute events [place] in the emergency department.    Disposition:  I discussed the  plan for discharge with the patient and/or their surrogate at bedside prior to discharge and they were in agreement with the plan and verbalized understanding of the return precautions provided. All questions answered to the best of my ability. Ultimately, the patient was discharged in stable condition with stable vital signs. I am reassured that they are capable of close follow up and good social support at home.   Clinical Impression: No diagnosis found.  Rx / DC Orders ED Discharge Orders     None       The plan for this patient was discussed with Dr. Theresia Lo, who voiced agreement and who oversaw evaluation and treatment of this patient.   Clinical Complexity A medically appropriate history, review of systems, and physical exam was performed.  My independent interpretations of EKG, labs, and radiology are documented in the ED course above.   If decision rules were used in this patient's evaluation, they are listed below.   Patient's presentation is most consistent with acute presentation with potential threat to life or bodily function.  Medical Decision Making Amount and/or Complexity of Data Reviewed Labs: ordered.  Risk Prescription drug management.    HPI/ROS      See MDM section for pertinent HPI and ROS. A complete ROS was performed with pertinent positives/negatives noted above.   Past Medical History:  Diagnosis Date   Arthritis    Cerebrovascular disease    Depression    GERD (gastroesophageal reflux disease)    Headache    Migraines   Hypertension    Stroke [facility]) 2010   Left sided weakness   Type 2 diabetes mellitus (HCC)     Past Surgical History:  Procedure Laterality Date   bilateral knee replacements  CATARACT EXTRACTION, BILATERAL     FRACTURE SURGERY Right    arm as a child   gallstones removed     I & D KNEE WITH POLY EXCHANGE Right 12/16/2020   Procedure: POLY EXCHANGE RIGHT KNEE;  Surgeon: Kathryne Hitch, MD;  Location: [facility]  ORS;  Service: Orthopedics;  Laterality: Right;   KNEE ARTHROSCOPY Bilateral    left carpal tunnel release      PAROTIDECTOMY Left 02/20/2023   Procedure: SUPERFICIAL PAROTIDECTOMY WITH FACIAL NERVE MONITORING;  Surgeon: Scarlette Ar, MD;  Location[facility] OR;  Service: ENT;  Laterality: Left;   REVERSE SHOULDER ARTHROPLASTY Left 04/23/2022   Procedure: LEFT REVERSE SHOULDER ARTHROPLASTY;  Surgeon: Huel Cote, MD;  Location: [facility] OR;  Service: Orthopedics;  Laterality: Left;   SHOULDER ARTHROSCOPY Bilateral    THYROIDECTOMY        Physical Exam   Vitals:   07/19/23 1502 07/19/23 1921  BP: 111/88 (!) 134/108  Pulse: 87 79  Resp: 18 18  Temp: 98.7 F (37.1 C) 98.6 F (37 C)  TempSrc: Oral Oral  SpO2: 97% 97%    Physical Exam Gen: NAD. Appears comfortable HENT: Conjunctiva clear, PERRL, EOMI. dry mucous membranes.  CV: RRR. No M/R/G Pulm: Lungs CTAB with no wheezing, rales, or rhonchi.  GI: Abdomen soft, non-distended.  Diffuse mild abdominal tenderness palpation worse in the epigastric region.  Normal bowel sounds in all 4 quadrants.  Negative Murphy sign.  No CVA tenderness bilaterally. MSK/Skin: No lower extremity edema. Extremities warm, well-perfused with 2+ pulses in all 4 extremities. Neuro: A&Ox3. GCS 15. Moves all extremities.     Procedures   If procedures were preformed on this patient, they are listed below:  Procedures   Mikeal Hawthorne, MD Emergency Medicine PGY-2   Please note that this documentation was produced with the assistance of voice-to-text technology and may contain errors.    Mikeal Hawthorne, MD 07/20/23 0009    Elayne Snare K, DO 07/20/23 [phone]

## 2023-07-19 NOTE — ED Provider Triage Note (Signed)
Emergency Medicine Provider Triage Evaluation Note  Monica Watson , a 68 y.o. female  was evaluated in triage.  Pt complains of abdominal pain that started yesterday but has been persistent with some nausea and vomiting.  She felt feverish last night but has not had any diarrhea in the last 2 days.  No prior abdominal surgeries.  She denies any respiratory symptoms.  Does not think she had any food poisoning.  Eating seems to make the symptoms worse.  Review of Systems  Positive: Abdominal pain, nausea and vomiting Negative: Diarrhea  Physical Exam  BP 111/88 (BP Location: Right Arm)   Pulse 87   Temp 98.7 F (37.1 C) (Oral)   Resp 18   SpO2 97%  Gen:   Awake, no distress   Resp:  Normal effort  MSK:   Moves extremities without difficulty  Other:  Abdominal exam with pain in the periumbilical area with some minimal right upper quadrant tenderness.  No flank pain  Medical Decision Making  Medically screening exam initiated at 3:48 PM.  Appropriate orders placed.  Monica Watson was informed that the remainder of the evaluation will be completed by another provider, this initial triage assessment does not replace that evaluation, and the importance of remaining in the ED until their evaluation is complete.     Monica Sprout, MD 07/19/23 [phone]

## 2023-07-19 NOTE — ED Triage Notes (Signed)
Pt BIB [facility] from home with reports of generalized abdominal pain and vomiting. Denies fevers.

## 2023-07-19 NOTE — Discharge Instructions (Addendum)
You were seen in the emergency department for abdominal pain, vomiting, and diarrhea.  Test performed while you are here include blood work, urine studies, and CT scan.  These tests showed no emergent or life-threatening causes of your symptoms.  We do have a urine culture pending, however based on urine we think it is unlikely you have a urinary tract infection.  If your urine culture ends up being positive, someone will call you and arrange for you to start antibiotics. You most likely have a viral gastroenteritis.  Your symptoms will improve with time, likely over the next several days.  We are sending in a prescription for antinausea medication that he can take as needed for ongoing nausea.  He can additionally take Tylenol and ibuprofen as needed for pain. If you experience significantly increased symptoms, inability keep food or drink down, loss of consciousness, or any other concerns, return to the ED for evaluation.  [name]:  Thank you for allowing [place] to take care of you today.  We hope you begin feeling better soon.  To-Do: Please follow-up with your primary doctor or call [phone] to schedule an appointment with a new primary care doctor Please return to the Emergency Department or call 911 if you experience chest pain, shortness of breath, severe pain, severe fever, altered mental status, or have any reason to think that you need emergency medical care.  Thank you again.  Hope you feel better soon.  Department of Emergency Medicine [facility]

## 2023-07-19 NOTE — Progress Notes (Signed)
Complex Care Management Care Guide Note  07/19/2023 Name: Monica Watson MRN: [id number] DOB: [birth year]  Monica Watson is a 68 y.o. year old female who is a primary care patient of Elberta Fortis, MD and is actively engaged with the care management team. I reached out to [name] by phone today to assist with re-scheduling  with the BSW.  Follow up plan: A telephone outreach attempt made.   Patient ask for a return call she is going to Emergency room.   [name]  [facility] Health  Value-Based Care Institute, [facility] Guide  Direct Dial: [phone]  Fax [phone]

## 2023-07-21 LAB — URINE CULTURE

## 2023-07-23 ENCOUNTER — Telehealth: Payer: Self-pay | Admitting: *Deleted

## 2023-07-23 NOTE — Progress Notes (Signed)
Complex Care Management Care Guide Note  07/23/2023 Name: Monica Watson MRN: [id number] DOB: 17-Dec-1955  Monica Watson is a 68 y.o. year old female who is a primary care patient of Elberta Fortis, MD and is actively engaged with the care management team. I reached out to Monica Watson by phone today to assist with re-scheduling  with the BSW.  Follow up plan: A unsuccessful telephone outreach attempt made. Patient ask for a return call.  Monica Watson  [facility]  Value-Based Care Institute, [facility] Guide  Direct Dial: [phone]  Fax [phone]

## 2023-07-23 NOTE — Progress Notes (Signed)
Complex Care Management Care Guide Note  07/23/2023 Name: Monica Watson MRN: [id number] DOB: 01-03-1956  Monica Watson is a 68 y.o. year old female who is a primary care patient of Elberta Fortis, MD and is actively engaged with the care management team. I reached out to [name] by phone today to assist with re-scheduling  with the BSW.  Follow up plan: Telephone appointment with complex care management team member scheduled for:  08/09/23  [name]  [facility] Health  Value-Based Care Institute, [facility] Guide  Direct Dial: [phone]  Fax [phone]

## 2023-07-29 ENCOUNTER — Ambulatory Visit
Admission: RE | Admit: 2023-07-29 | Discharge: 2023-07-29 | Disposition: A | Discharge status: Home / Self Care | Payer: 59 | Source: Ambulatory Visit | Attending: Otolaryngology

## 2023-07-29 DIAGNOSIS — E042 Nontoxic multinodular goiter: Secondary | ICD-10-CM | POA: Diagnosis not present

## 2023-07-29 DIAGNOSIS — E041 Nontoxic single thyroid nodule: Secondary | ICD-10-CM

## 2023-07-30 ENCOUNTER — Ambulatory Visit: Payer: 59 | Admitting: Obstetrics and Gynecology

## 2023-07-30 ENCOUNTER — Encounter: Payer: Self-pay | Admitting: Obstetrics and Gynecology

## 2023-07-30 VITALS — BP 124/79 | HR 71 | Ht 65.0 in | Wt 189.0 lb

## 2023-07-30 DIAGNOSIS — Z1231 Encounter for screening mammogram for malignant neoplasm of breast: Secondary | ICD-10-CM

## 2023-07-30 DIAGNOSIS — N9489 Other specified conditions associated with female genital organs and menstrual cycle: Secondary | ICD-10-CM | POA: Diagnosis not present

## 2023-07-30 NOTE — Progress Notes (Signed)
  CC: adnexal mass Subjective:    Patient ID: Monica Watson, female    DOB: [birth year], 68 y.o.   MRN: [id number]  HPI 68 yo G7P3 seen for discussion of right adnexal mass seen as incidental finding.  Pt has no pain or other symptoms.  Pt denies early satiety, weight loss or gain.  The mass has been stable on multiple CT scans, ultrasound and MRI.  There is some confusion if the mass is an exophytic calcified fibroid, broad ligament fibroid or true adnexal mass.   Review of Systems     Objective:   Physical Exam Vitals:   07/30/23 1329  BP: 124/79  Pulse: 71   SVE: small uterus, no adnexal mass or adnexal pain reproduced with exam      Assessment & Plan:   1. Encounter for screening mammogram for malignant neoplasm of breast (Primary)  - MM 3D SCREENING MAMMOGRAM BILATERAL BREAST; Future  2. Adnexal mass   3. Mass of uterine adnexa Will check ROMA test to see if there is increased risk for ovarian malignancy. 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   Will recheck CT scan in 6 months to make sure mass is stable and not growing.  If abnl ROMA or change in mass size, consider diagnostic laparoscopy or referral to gyn oncology.  - Ovarian Malignancy Risk-ROMA - CT ABDOMEN PELVIS W CONTRAST; Future    Jerilynn DELENA Buddle, MD Faculty Attending, [facility]

## 2023-07-31 DIAGNOSIS — E041 Nontoxic single thyroid nodule: Secondary | ICD-10-CM | POA: Diagnosis not present

## 2023-07-31 DIAGNOSIS — Z9889 Other specified postprocedural states: Secondary | ICD-10-CM | POA: Diagnosis not present

## 2023-07-31 DIAGNOSIS — D119 Benign neoplasm of major salivary gland, unspecified: Secondary | ICD-10-CM | POA: Diagnosis not present

## 2023-08-01 LAB — OVARIAN MALIGNANCY RISK-ROMA
Cancer Antigen (CA) 125: 11.5 U/mL (ref 0.0–38.1)
HE4: 81.6 pmol/L (ref 0.0–96.5)
Postmenopausal ROMA: 1.51
Premenopausal ROMA: 2.02 — ABNORMAL HIGH

## 2023-08-01 LAB — POSTMENOPAUSAL INTERP: LOW

## 2023-08-01 LAB — PREMENOPAUSAL INTERP: HIGH

## 2023-08-09 ENCOUNTER — Ambulatory Visit: Payer: Self-pay | Admitting: Licensed Clinical Social Worker

## 2023-08-09 NOTE — Patient Instructions (Signed)
Visit Information  Thank you for taking time to visit with me today. Please don't hesitate to contact me if I can be of assistance to you.   Following are the goals we discussed today:   Goals Addressed             This Visit's Progress    Care Coordination Activities       Care Coordination Interventions: Patient stated that things are better in her home, she has section 8 and the the [facility] will be redoing her floors and that she is happy with them keeping in contact and will be fixing her floors.  SW went thorough the other SDOH questions and no other needs at this time. SW will follow up with patient on 08/26/2023 at 1:30 am         Our next appointment is by telephone on 08/26/2023 at 1:30 pm  Please call the care guide team at [phone] if you need to cancel or reschedule your appointment.   If you are experiencing a Mental Health or Behavioral Health Crisis or need someone to talk to, please call the Suicide and Crisis Lifeline: 988 go to [facility] [address] [place] [phone]) call 911  Patient verbalizes understanding of instructions and care plan provided today and agrees to view in MyChart. Active MyChart status and patient understanding of how to access instructions and care plan via MyChart confirmed with patient.     Jeanie Cooks, PhD [facility], [facility] Social Worker Direct Dial: [phone]  Fax: [phone]

## 2023-08-09 NOTE — Patient Outreach (Signed)
  Care Coordination   Initial Visit Note   08/09/2023 Name: Monica Watson MRN: [id number] DOB: 24-Mar-1956  Monica Watson is a 68 y.o. year old female who sees Elberta Fortis, MD for primary care. I spoke with  [name] by phone today.  What matters to the patients health and wellness today?  Housing issues    Goals Addressed             This Visit's Progress    Care Coordination Activities       Care Coordination Interventions: Patient stated that things are better in her home, she has section 8 and the the [facility] will be redoing her floors and that she is happy with them keeping in contact and will be fixing her floors.  SW went thorough the other SDOH questions and no other needs at this time. SW will follow up with patient on 08/26/2023 at 1:30 am         SDOH assessments and interventions completed:  Yes  SDOH Interventions Today    Flowsheet Row Most Recent Value  SDOH Interventions   Food Insecurity Interventions Intervention Not Indicated  Transportation Interventions Intervention Not Indicated  [[employer] transports to the doctor and medicine is delivered]  Utilities Interventions Intervention Not Indicated        Care Coordination Interventions:  Yes, provided  Interventions Today    Flowsheet Row Most Recent Value  General Interventions   General Interventions Discussed/Reviewed General Interventions Discussed, [employer]  floors are getting done by [facility]/Section-8]        Follow up plan: Follow up call scheduled for 08/26/2023 at 1:30 pm    Encounter Outcome:  Patient Visit Completed   Jeanie Cooks, PhD [facility], [facility] [place] Social Worker Direct Dial: [phone]  Fax: [phone]

## 2023-08-10 ENCOUNTER — Encounter: Payer: Self-pay | Admitting: Obstetrics and Gynecology

## 2023-08-14 ENCOUNTER — Other Ambulatory Visit: Payer: Self-pay

## 2023-08-15 DIAGNOSIS — I10 Essential (primary) hypertension: Secondary | ICD-10-CM | POA: Diagnosis not present

## 2023-08-15 DIAGNOSIS — F17210 Nicotine dependence, cigarettes, uncomplicated: Secondary | ICD-10-CM | POA: Diagnosis not present

## 2023-08-15 DIAGNOSIS — R7989 Other specified abnormal findings of blood chemistry: Secondary | ICD-10-CM | POA: Diagnosis not present

## 2023-08-15 DIAGNOSIS — M5416 Radiculopathy, lumbar region: Secondary | ICD-10-CM | POA: Diagnosis not present

## 2023-08-15 DIAGNOSIS — Z780 Asymptomatic menopausal state: Secondary | ICD-10-CM | POA: Diagnosis not present

## 2023-08-15 DIAGNOSIS — M25552 Pain in left hip: Secondary | ICD-10-CM | POA: Diagnosis not present

## 2023-08-15 DIAGNOSIS — M8580 Other specified disorders of bone density and structure, unspecified site: Secondary | ICD-10-CM | POA: Diagnosis not present

## 2023-08-16 ENCOUNTER — Ambulatory Visit: Payer: 59

## 2023-08-26 ENCOUNTER — Ambulatory Visit: Payer: Self-pay | Admitting: Licensed Clinical Social Worker

## 2023-08-26 NOTE — Progress Notes (Deleted)
 SUBJECTIVE:   CHIEF COMPLAINT / HPI:   *Low dose lung cancer CT  Diabetes Current Regimen: Metformin 500mg  BID CBGs: ***  Last A1c:  Lab Results  Component Value Date   HGBA1C 5.9 (H) 04/01/2023    Denies polyuria, polydipsia, hypoglycemia *** Last Eye Exam: DUE? Statin: Atorvastatin 40mg  daily ACE/ARB: Losartan 50mg  daily   Surgical Pre-operative Evaluation:   Procedure: Planning for left total hip replacement on/12/2023 Procedural risk: Low risk   low risk- ambulatory surgery outpatient, cataract, endoscopy, skin surgery, breast surgery High risk- peripheral vascular surgery, abdominal aorta surgery EVERYTHING ELSE IS INTERMEDIATE RISK (orthopedic surgery, head and neck procedures, hysterectomy/gyn surgeries, hernia repair, cholescystectomy)  Anesthesia: *** (general, spinal, regional block)  PMH: ***  Surgical History:  The patient denies*** any complication with anesthesia, bleeding, or post-operative confusion, nausea, or vomiting in prior surgical interventions. The patient denies any *** history of spinal surgeries.  Family History: The patient denies*** any family history of complications with anesthesia and VTE.  Social History: Tobacco Use: *** (Recommend smoking cessation anytime before surgery to improve wound healing, 4-8 weeks befter surgery reduces risk of respiratory complications) Alcohol Use: *** Other substance use: ***  Screening for OSA: STOP BANG score of ***, *** risk of OSA  Medication Adjustments: ***can delete and put your patient's specific medications*** - Generally hold ACEI/ARB, and diuretics on day of surgery - Generally continute steroids, amlodipine, BB, clonidine (risk of withdrawal associated with harm) - Continue statins, levothyroxine - SGLT2 inhibitors stopped 3-4 days prior to surgery, other oral and injectable non-insulin agents held on day of surgery - Insulin- reduce basal dose by 10-25% prior to surgery (either night  before or morning of depending on how patient takes it) and hold prandial insulin on day of surgery - for DAPT- talk to cards! Earliest to possibly hold is 4-6 weeks after placement of BMS/DES - Antiplatelet- Plavix for a minimum of 5 days and continue aspirin if possible, prasugrel hold for 7 days, ticagrelor hold for 3 days - Aspirin - if for primary prevention hold, if for secondary prevention continue if possible (if spinal surgery or high risk brain surgery hold for 5 days) - Anticoagulants:  - Consider bridging for patients on warfarin for mechanical mitral valve, mechanical aortic valve, high CHADS2VASC score- stop 5 days before surgery and proceed with surgery when INR < 1.5         - Apixaban- stop 24-36hrs before moderate risk surgery, 48+hrs before high risk surgery (depends on CrCl)  - Rivaroxaban - same as apixaban - Caribopda-levodopa- contiue through day of srugery as possible, attempt to scehdule first case of day (increase risk of confusion/rigidity when held) - SSRI/SNRI- consider holding if high risk procedure or aspirin therapy  Risk Stratification Tool: Chales Abrahams Calculator: *** % risk of MI or cardiac event, intraoperatively or up to 30 days post-op  Functional Capacity:  Walking 2 blocks = 2.75 METS  Climb 1 flight of stairs = 5.50 METS   Pushing the mower = 4.50 METS   Singles tennis = 8 METS   If patients can achieve > 4 METS- no stress test recommended (if they cannot, consider a stress test as this can change management)  Labs to Check: - CBC- if > 15 yo and undergoing intermediate or high risk surgery, or younger patient that may have significant blood loss - Renal function- if taking antihypertensives or meds that can affect renal function or diseases like T2DM that can affect renal function -  A1c- reasonable to check for ages 82-70 who are overweight/obese, or known T2DM/prediabetes - Coagulation testing- if history of easy bleeding, known liver or renal  disease, taking anticoagulants - BNP - if age  > 44yo, or age 6-64 with significant CV disease - Urinalysis- if undergoing implantation of prosthetic joint or valve, undergoing invasive urologic procedures  ECG: reasonable if known CV disease or other risk factors undergoing intermediate or high risk surgery CXR: only for patients with new or unstable cardiopulmonary symptoms  Final Assessment:  The patient is at low***intermediate***high*** risk of complications from a low***moderate***high*** risk surgery.  I recommend the following additional tests: I recommend the following changes to medications in the perioperative setting:   PERTINENT  PMH / PSH: ***  OBJECTIVE:   There were no vitals taken for this visit. ***  General: NAD, pleasant, able to participate in exam Cardiac: RRR, no murmurs. Respiratory: CTAB, normal effort, No wheezes, rales or rhonchi Abdomen: Bowel sounds present, nontender, nondistended Extremities: no edema or cyanosis. Skin: warm and dry, no rashes noted Neuro: alert, no obvious focal deficits Psych: Normal affect and mood  ASSESSMENT/PLAN:   No problem-specific Assessment & Plan notes found for this encounter.     Dr. Elberta Fortis, DO [place] [facility]    {    This will disappear when note is signed, click to select method of visit    :1}

## 2023-08-26 NOTE — Patient Outreach (Signed)
 Care Coordination   08/26/2023 Name: Thresa Dozier MRN: [id number] DOB: [birth year]   Care Coordination Outreach Attempts:  An unsuccessful outreach was attempted for an appointment today.  Follow Up Plan:  Additional outreach attempts will be made to offer the patient complex care management information and services.   Encounter Outcome:  No Answer   Care Coordination Interventions:  No, not indicated    SW will make another attempt to follow up with patient  Jeanie Cooks, PhD [facility], [facility] Social Worker Direct Dial: [phone]  Fax: [phone]

## 2023-08-26 NOTE — Patient Outreach (Signed)
 Care Coordination   Follow Up Visit Note   08/26/2023 Name: Monica Watson MRN: [id number] DOB: 03-19-1956  Monica Watson is a 68 y.o. year old female who sees Elberta Fortis, MD for primary care. I spoke with  [name] by phone today.  What matters to the patients health and wellness today?  Housing    Goals Addressed             This Visit's Progress    Care Coordination Activities       Care Coordination Interventions: Patient stated that things are better in her home, she has section 8 and the the [facility] will be redoing her floors and that she is happy with them keeping in contact and will be fixing her floors.  SW went thorough the other SDOH questions and no other needs at this time. Patient will be relocated to another apartment by [facility] due to floor needing to be repaired.  SW will follow up with patient on 09/12/2023 at 1:30 am         SDOH assessments and interventions completed:  Yes  SDOH Interventions Today    Flowsheet Row Most Recent Value  SDOH Interventions   Food Insecurity Interventions Intervention Not Indicated  Housing Interventions Intervention Not Indicated  [Patient will be relocated to another apartment by [facility] due to floor needing to be repaired.]  Transportation Interventions Intervention Not Indicated  Utilities Interventions Intervention Not Indicated        Care Coordination Interventions:  Yes, provided  Interventions Today    Flowsheet Row Most Recent Value  General Interventions   General Interventions Discussed/Reviewed General Interventions Reviewed, [employer] will be relocated to another apartment by [facility] due to floor needing to be repaired.]        Follow up plan: Follow up call scheduled for 09/12/2023 at 1:30 pm    Encounter Outcome:  Patient Visit Completed   Jeanie Cooks, PhD [facility], [facility] Social Worker Direct Dial: [phone]  Fax:  [phone]

## 2023-08-26 NOTE — Patient Instructions (Signed)
 Visit Information  Thank you for taking time to visit with me today. Please don't hesitate to contact me if I can be of assistance to you.   Following are the goals we discussed today:   Goals Addressed             This Visit's Progress    Care Coordination Activities       Care Coordination Interventions: Patient stated that things are better in her home, she has section 8 and the the [facility] will be redoing her floors and that she is happy with them keeping in contact and will be fixing her floors.  SW went thorough the other SDOH questions and no other needs at this time. Patient will be relocated to another apartment by [facility] due to floor needing to be repaired.  SW will follow up with patient on 09/12/2023 at 1:30 am         Our next appointment is by telephone on 09/12/2023 at 1:30 pm  Please call the care guide team at [phone] if you need to cancel or reschedule your appointment.   If you are experiencing a Mental Health or Behavioral Health Crisis or need someone to talk to, please call the Suicide and Crisis Lifeline: 988 go to [facility] [address], [place] [phone]) call 911  Patient verbalizes understanding of instructions and care plan provided today and agrees to view in MyChart. Active MyChart status and patient understanding of how to access instructions and care plan via MyChart confirmed with patient.     Jeanie Cooks, PhD [facility], [facility] Social Worker Direct Dial: [phone]  Fax: [phone]

## 2023-08-27 ENCOUNTER — Telehealth: Payer: Self-pay

## 2023-08-27 ENCOUNTER — Telehealth: Admitting: Family Medicine

## 2023-08-27 ENCOUNTER — Ambulatory Visit: Payer: 59 | Admitting: Family Medicine

## 2023-08-27 ENCOUNTER — Ambulatory Visit: Payer: 59

## 2023-08-27 DIAGNOSIS — R0981 Nasal congestion: Secondary | ICD-10-CM

## 2023-08-27 DIAGNOSIS — J309 Allergic rhinitis, unspecified: Secondary | ICD-10-CM

## 2023-08-27 DIAGNOSIS — E119 Type 2 diabetes mellitus without complications: Secondary | ICD-10-CM

## 2023-08-27 MED ORDER — OXYMETAZOLINE HCL 0.05 % NA SOLN
1.0000 | Freq: Two times a day (BID) | NASAL | 0 refills | Status: DC
Start: 2023-08-27 — End: 2023-09-02

## 2023-08-27 MED ORDER — AZELASTINE-FLUTICASONE 137-50 MCG/ACT NA SUSP: 1.0000 | Freq: Two times a day (BID) | NASAL | 3 refills | Status: AC

## 2023-08-27 MED ORDER — DM-GUAIFENESIN ER 30-600 MG PO TB12: 1.0000 | ORAL_TABLET | Freq: Two times a day (BID) | ORAL | 0 refills | Status: DC

## 2023-08-27 NOTE — Progress Notes (Signed)
 Virtual Visit via Telephone Note  I connected with Monica Watson on 08/27/23 at 10:50 AM EST by telephone and verified that I am speaking with the correct person using two identifiers.  Location: Patient: Monica Watson - home Provider: Elberta Fortis - [facility]   I discussed the limitations, risks, security and privacy concerns of performing an evaluation and management service by telephone and the availability of in person appointments. I also discussed with the patient that there may be a patient responsible charge related to this service. The patient expressed understanding and agreed to proceed.   History of Present Illness: Sick Felt bad yesterday afternoon after home health saw her. Having body ache, congestion, sneezing. Denies cough, SOB, N/V/D and sore throat. Making herself some tea and honey and soup. No other sick contacts. Not checking temp but does not feel like a fever.  Has cough drops and Tylenol at home.    Does not have a way to get to the pharmacy, needs medications sent in so they will be delivered to her home.   Observations/Objective: Nasally voice Unlabored breathing Alert and oriented Appropriately interactive  Assessment and Plan: Nasal congestion Likely secondary to viral illness vs allergies.  Low concern for underlying severe illness or pneumonia.  Will treat symptomatically and follow-up in the clinic as needed for persistent symptoms. -Nasal saline rinse followed by Dymista 1 spray in each nostril twice daily -Utilize Oxymetolazone twice daily x 3 days maximum -Mucinex twice daily -Continue oral hydration with tea with honey, soup and other liquids  Follow Up Instructions: Schedule clinic appointment when better for continued care or as needed for persistent symptoms    I discussed the assessment and treatment plan with the patient. The patient was provided an opportunity to ask questions and all were answered. The patient agreed with the plan and  demonstrated an understanding of the instructions.   The patient was advised to call back or seek an in-person evaluation if the symptoms worsen or if the condition fails to improve as anticipated.   Elberta Fortis, MD

## 2023-08-27 NOTE — Telephone Encounter (Signed)
 Patient calls nurse line requesting medication for sick symptoms. Symptoms started last night. Runny nose, sneezing, body aches.   No cough, unsure of fever, does not have thermometer. Denies sore throat.   She does not have transportation to be able to get to pharmacy or the office.   No known sick exposures. Feels like she may have the flu.   Scheduled for virtual visit with Dr. Ardyth Harps this morning.   Veronda Prude, RN

## 2023-09-02 ENCOUNTER — Ambulatory Visit (INDEPENDENT_AMBULATORY_CARE_PROVIDER_SITE_OTHER): Admitting: Family Medicine

## 2023-09-02 ENCOUNTER — Encounter: Payer: Self-pay | Admitting: Family Medicine

## 2023-09-02 VITALS — BP 124/80 | HR 79 | Ht 65.0 in | Wt 184.8 lb

## 2023-09-02 DIAGNOSIS — E119 Type 2 diabetes mellitus without complications: Secondary | ICD-10-CM | POA: Diagnosis not present

## 2023-09-02 DIAGNOSIS — Z720 Tobacco use: Secondary | ICD-10-CM | POA: Diagnosis not present

## 2023-09-02 DIAGNOSIS — R0981 Nasal congestion: Secondary | ICD-10-CM

## 2023-09-02 DIAGNOSIS — M1612 Unilateral primary osteoarthritis, left hip: Secondary | ICD-10-CM

## 2023-09-02 LAB — POCT GLYCOSYLATED HEMOGLOBIN (HGB A1C): HbA1c, POC (controlled diabetic range): 6.1 % (ref 0.0–7.0)

## 2023-09-02 MED ORDER — MONTELUKAST SODIUM 10 MG PO TABS: 10.0000 mg | ORAL_TABLET | Freq: Every day | ORAL | 1 refills | Status: DC

## 2023-09-02 MED ORDER — MONTELUKAST SODIUM 10 MG PO TABS: 10.0000 mg | ORAL_TABLET | Freq: Every day | ORAL | 3 refills | Status: DC

## 2023-09-02 NOTE — Patient Instructions (Addendum)
 It was wonderful to see you today! Thank you for choosing [facility].   Please bring ALL of your medications with you to every visit.   Today we talked about:  I think you will do well with your surgery.  You can continue to take the aspirin the day of your surgery.  Please hold your losartan the day of your surgery but you can take it the following done. For your allergies and nasal congestion I did send in an allergy pill you take nightly.  Please continue to use the Dymista 1 spray in each nostril twice per day. We are checking her A1c today.  Been very well-controlled in the past on your metformin.  Please continue to take metformin and I will let you know if there are concerns.  I also referred you to an eye doctor to perform diabetic eye exams.  Our office will call you with this information. I reordered the low-dose CT screening for lung cancer.  Office will call you with the scheduling information.  Please follow up in 3 months   We are checking some labs today. If they are abnormal, I will call you. If they are normal, I will send you a MyChart message (if it is active) or a letter in the mail. If you do not hear about your labs in the next 2 weeks, please call the office.  Call the clinic at [phone] if your symptoms worsen or you have any concerns.  Please be sure to schedule follow up at the front desk before you leave today.   Elberta Fortis, DO Family Medicine

## 2023-09-02 NOTE — Progress Notes (Unsigned)
    SUBJECTIVE:   CHIEF COMPLAINT / HPI:   Diabetes Current Regimen: Metformin 500mg  BID CBGs: Not checking  Last A1c:  Lab Results  Component Value Date   HGBA1C 5.9 (H) 04/01/2023    Denies polyuria, polydipsia, hypoglycemia.  Last Eye Exam: DUE - referred today Statin: Atorvastatin 40mg  daily ACE/ARB: Losartan 50mg  daily   Surgical Pre-operative Evaluation: Procedure: Planning for left total hip replacement on 09/30/2023 Procedural risk: Intermediate risk  Anesthesia: general  -2 prior surgeries in the last year under general anesthesia, tolerated well -Current smoker, 3 to 4 cigarettes/day.  Declines additional resources today. -No history of OSA or snoring  Medication Adjustments: - Hold Losartan on day of surgery - Continue statins - Aspirin - continue if possible - Continue clonazepam given regular use  Risk Stratification Tool: Chales Abrahams Calculator: 0.2% risk of MI or cardiac event, intraoperatively or up to 30 days post-op  Functional Capacity: Can climb flight of stairs   PERTINENT  PMH / PSH: HTN, T2DM, h/o CVA, depression, chronic back pain   OBJECTIVE:   BP 124/80   Pulse 79   Ht 5\' 5"  (1.651 m)   Wt 184 lb 12.8 oz (83.8 kg)   SpO2 98%   BMI 30.75 kg/m    General: NAD, pleasant, able to participate in exam Cardiac: RRR, no murmurs. Respiratory: CTAB, normal effort, No wheezes, rales or rhonchi Abdomen: Bowel sounds present, nontender, nondistended Extremities: no edema or cyanosis. Skin: warm and dry, no rashes noted Neuro: alert, no obvious focal deficits Psych: Normal affect and mood  ASSESSMENT/PLAN:   Assessment & Plan Osteoarthritis of left hip, unspecified osteoarthritis type Planning for total left hip replacement on/12/2023.  Patient is low risk Chales Abrahams score: 0.2%) for complications for an intermediate risk surgery.  -Recommend holding losartan the day of the surgery and continuing all medication including aspirin Type 2 diabetes  mellitus without complication, without long-term current use of insulin (HCC) A1c 6.1, similar with prior.  Continue metformin 500 mg daily -Repeat A1c in 6 months -Referred to Ophthalmology for diabetic eye exam -ACR today Congestion of nasal sinus Persistently symptomatic even with Dymista.  Unfortunately OTC antihistamines not covered by insurance and not financially feasible for patient therefore will trial montelukast. -Start montelukast 10 mg nightly Tobacco use Current smoker, down to 3 to 4 cigarettes/day. declines additional cessation resources.  23+ pack year history. -Low-dose CT for lung cancer screening   Dr. Elberta Fortis, DO [facility]

## 2023-09-03 ENCOUNTER — Encounter: Payer: Self-pay | Admitting: Family Medicine

## 2023-09-03 LAB — MICROALBUMIN / CREATININE URINE RATIO
Creatinine, Urine: 303.9 mg/dL
Microalb/Creat Ratio: 5 mg/g{creat} (ref 0–29)
Microalbumin, Urine: 16.1 ug/mL

## 2023-09-03 NOTE — Assessment & Plan Note (Signed)
 Planning for total left hip replacement on/12/2023.  Patient is low risk Chales Abrahams score: 0.2%) for complications for an intermediate risk surgery.  -Recommend holding losartan the day of the surgery and continuing all medication including aspirin

## 2023-09-03 NOTE — Assessment & Plan Note (Signed)
 A1c 6.1, similar with prior.  Continue metformin 500 mg daily -Repeat A1c in 6 months -Referred to Ophthalmology for diabetic eye exam -ACR today

## 2023-09-12 ENCOUNTER — Ambulatory Visit: Payer: Self-pay | Admitting: Licensed Clinical Social Worker

## 2023-09-12 NOTE — Patient Outreach (Signed)
 Care Coordination   09/12/2023 Name: Monica Watson MRN: [id number] DOB: 08/02/1955   Care Coordination Outreach Attempts:  An unsuccessful outreach was attempted for an appointment today.  Follow Up Plan:  Additional outreach attempts will be made to offer the patient complex care management information and services.   Encounter Outcome:  No Answer   Care Coordination Interventions:  No, not indicated    This was the SW's 2nd attempt to call the patient, Sw will attempt again for a 3rd and final    Jeanie Cooks, PhD [facility], [facility] Social Worker Direct Dial: [phone]  Fax: [phone]

## 2023-09-13 ENCOUNTER — Ambulatory Visit: Payer: Self-pay | Admitting: Licensed Clinical Social Worker

## 2023-09-13 NOTE — Patient Outreach (Signed)
 Care Coordination   Follow Up Visit Note   09/13/2023 Name: Monica Watson MRN: [id number] DOB: [birth year]  Monica Watson is a 68 y.o. year old female who sees Elberta Fortis, MD for primary care. I spoke with  [name] by phone today.  What matters to the patients health and wellness today?  Housing     Goals Addressed             This Visit's Progress    Care Coordination Activities       Care Coordination Interventions: Patient stated that she wants to move now and will be calling her landlord to discuss.  SW Signe Colt) will follow up with patient on 10/02/2023 at 9:30 am         SDOH assessments and interventions completed:  Yes  SDOH Interventions Today    Flowsheet Row Most Recent Value  SDOH Interventions   Food Insecurity Interventions Intervention Not Indicated  Housing Interventions Intervention Not Indicated  [The patient is wanting to move and will be contacting the landlord with [facility] and the SW will mail the affordable  housing list]        Care Coordination Interventions:  Yes, provided  Interventions Today    Flowsheet Row Most Recent Value  General Interventions   General Interventions Discussed/Reviewed General Interventions Reviewed, Community Resources  [SW will mail an affordable housing list]        Follow up plan: Follow up call scheduled for 10/02/2023 at 9:30 am    Encounter Outcome:  Patient Visit Completed   Jeanie Cooks, PhD [facility], [facility] Social Worker Direct Dial: [phone]  Fax: [phone]

## 2023-09-13 NOTE — Patient Instructions (Signed)
 Visit Information  Thank you for taking time to visit with me today. Please don't hesitate to contact me if I can be of assistance to you.   Following are the goals we discussed today:   Goals Addressed             This Visit's Progress    Care Coordination Activities       Care Coordination Interventions: Patient stated that she wants to move now and will be calling her landlord to discuss.  SW Signe Colt) will follow up with patient on 10/02/2023 at 9:30 am         Our next appointment is by telephone on 10/02/2023 at 9:30 am  Please call the care guide team at [phone] if you need to cancel or reschedule your appointment.   If you are experiencing a Mental Health or Behavioral Health Crisis or need someone to talk to, please call the Suicide and Crisis Lifeline: 988 go to [facility] [address], [place] [phone]) call 911  Patient verbalizes understanding of instructions and care plan provided today and agrees to view in MyChart. Active MyChart status and patient understanding of how to access instructions and care plan via MyChart confirmed with patient.     Jeanie Cooks, PhD [facility], [facility] Social Worker Direct Dial: [phone]  Fax: [phone]

## 2023-09-16 DIAGNOSIS — M25552 Pain in left hip: Secondary | ICD-10-CM | POA: Diagnosis not present

## 2023-09-16 DIAGNOSIS — Z79899 Other long term (current) drug therapy: Secondary | ICD-10-CM | POA: Diagnosis not present

## 2023-09-16 DIAGNOSIS — Z79891 Long term (current) use of opiate analgesic: Secondary | ICD-10-CM | POA: Diagnosis not present

## 2023-09-16 DIAGNOSIS — I10 Essential (primary) hypertension: Secondary | ICD-10-CM | POA: Diagnosis not present

## 2023-09-16 DIAGNOSIS — F17210 Nicotine dependence, cigarettes, uncomplicated: Secondary | ICD-10-CM | POA: Diagnosis not present

## 2023-09-16 DIAGNOSIS — M5416 Radiculopathy, lumbar region: Secondary | ICD-10-CM | POA: Diagnosis not present

## 2023-09-16 DIAGNOSIS — M8580 Other specified disorders of bone density and structure, unspecified site: Secondary | ICD-10-CM | POA: Diagnosis not present

## 2023-09-16 DIAGNOSIS — Z780 Asymptomatic menopausal state: Secondary | ICD-10-CM | POA: Diagnosis not present

## 2023-09-17 ENCOUNTER — Other Ambulatory Visit: Payer: Self-pay | Admitting: Physician Assistant

## 2023-09-17 MED ORDER — ASPIRIN 81 MG PO CHEW: 81.0000 mg | CHEWABLE_TABLET | Freq: Two times a day (BID) | ORAL | 0 refills | Status: AC

## 2023-09-17 MED ORDER — OXYCODONE-ACETAMINOPHEN 5-325 MG PO TABS: 1.0000 | ORAL_TABLET | Freq: Four times a day (QID) | ORAL | 0 refills | Status: AC | PRN

## 2023-09-17 MED ORDER — METHOCARBAMOL 750 MG PO TABS: 750.0000 mg | ORAL_TABLET | Freq: Three times a day (TID) | ORAL | 2 refills | Status: DC | PRN

## 2023-09-17 MED ORDER — ONDANSETRON HCL 4 MG PO TABS: 4.0000 mg | ORAL_TABLET | Freq: Three times a day (TID) | ORAL | 0 refills | Status: DC | PRN

## 2023-09-17 MED ORDER — DOCUSATE SODIUM 100 MG PO CAPS: 100.0000 mg | ORAL_CAPSULE | Freq: Every day | ORAL | 2 refills | Status: AC | PRN

## 2023-09-17 MED ORDER — DOXYCYCLINE HYCLATE 100 MG PO CAPS: 100.0000 mg | ORAL_CAPSULE | Freq: Two times a day (BID) | ORAL | 0 refills | Status: AC

## 2023-09-18 NOTE — Pre-Procedure Instructions (Signed)
 Surgical Instructions   Your procedure is scheduled on Monday, April 7th. Report to [facility] Main Entrance "A" at 07:45 A.M., then check in with the Admitting office. Any questions or running late day of surgery: call [phone]  Questions prior to your surgery date: call [phone], Monday-Friday, 8am-4pm. If you experience any cold or flu symptoms such as cough, fever, chills, shortness of breath, etc. between now and your scheduled surgery, please notify us at the above number.     Remember:  Do not eat after midnight the night before your surgery  You may drink clear liquids until 07:15 AM the morning of your surgery.   Clear liquids allowed are: Water, Non-Citrus Juices (without pulp), Carbonated Beverages, Clear Tea (no milk, honey, etc.), Black Coffee Only (NO MILK, CREAM OR POWDERED CREAMER of any kind), and Gatorade.  Patient Instructions  The night before surgery:  No food after midnight. ONLY clear liquids after midnight   The day of surgery (if you have diabetes): Drink ONE (1) 12 oz G2 given to you in your pre admission testing appointment by 07:15 AM the morning of surgery. Drink in one sitting. Do not sip.  This drink was given to you during your hospital  pre-op appointment visit.  Nothing else to drink after completing the  12 oz bottle of G2.         If you have questions, please contact your surgeon's office.    Take these medicines the morning of surgery with A SIP OF WATER  atorvastatin (LIPITOR)    May take these medicines IF NEEDED: Azelastine-Fluticasone  clonazePAM (KLONOPIN)  omeprazole (PRILOSEC)  oxyCODONE-acetaminophen (PERCOCET)  tiZANidine (ZANAFLEX)    Follow your surgeon's instructions on when to stop Aspirin.  If no instructions were given by your surgeon then you will need to call the office to get those instructions.    One week prior to surgery, STOP taking any Aleve, Naproxen, Ibuprofen, Motrin, Advil, Goody's, BC's, all herbal  medications, fish oil, and non-prescription vitamins.  WHAT DO I DO ABOUT MY DIABETES MEDICATION?   Do not take metFORMIN (GLUCOPHAGE-XR) the morning of surgery.   HOW TO MANAGE YOUR DIABETES BEFORE AND AFTER SURGERY  Why is it important to control my blood sugar before and after surgery? Improving blood sugar levels before and after surgery helps healing and can limit problems. A way of improving blood sugar control is eating a healthy diet by:  Eating less sugar and carbohydrates  Increasing activity/exercise  Talking with your doctor about reaching your blood sugar goals High blood sugars (greater than 180 mg/dL) can raise your risk of infections and slow your recovery, so you will need to focus on controlling your diabetes during the weeks before surgery. Make sure that the doctor who takes care of your diabetes knows about your planned surgery including the date and location.  How do I manage my blood sugar before surgery? Check your blood sugar at least 4 times a day, starting 2 days before surgery, to make sure that the level is not too high or low.  Check your blood sugar the morning of your surgery when you wake up and every 2 hours until you get to the Short Stay unit.  If your blood sugar is less than 70 mg/dL, you will need to treat for low blood sugar: Do not take insulin. Treat a low blood sugar (less than 70 mg/dL) with  cup of clear juice (cranberry or apple), 4 glucose tablets, OR glucose gel.  Recheck blood sugar in 15 minutes after treatment (to make sure it is greater than 70 mg/dL). If your blood sugar is not greater than 70 mg/dL on recheck, call [phone] for further instructions. Report your blood sugar to the short stay nurse when you get to Short Stay.  If you are admitted to the hospital after surgery: Your blood sugar will be checked by the staff and you will probably be given insulin after surgery (instead of oral diabetes medicines) to make sure you  have good blood sugar levels. The goal for blood sugar control after surgery is 80-180 mg/dL.                     Do NOT Smoke (Tobacco/Vaping) for 24 hours prior to your procedure.  If you use a CPAP at night, you may bring your mask/headgear for your overnight stay.   You will be asked to remove any contacts, glasses, piercing's, hearing aid's, dentures/partials prior to surgery. Please bring cases for these items if needed.    Patients discharged the day of surgery will not be allowed to drive home, and someone needs to stay with them for 24 hours.  SURGICAL WAITING ROOM VISITATION Patients may have no more than 2 support people in the waiting area - these visitors may rotate.   Pre-op nurse will coordinate an appropriate time for 1 ADULT support person, who may not rotate, to accompany patient in pre-op.  Children under the age of 32 must have an adult with them who is not the patient and must remain in the main waiting area with an adult.  If the patient needs to stay at the hospital during part of their recovery, the visitor guidelines for inpatient rooms apply.  Please refer to the [facility] website for the visitor guidelines for any additional information.   If you received a COVID test during your pre-op visit  it is requested that you wear a mask when out in public, stay away from anyone that may not be feeling well and notify your surgeon if you develop symptoms. If you have been in contact with anyone that has tested positive in the last 10 days please notify you surgeon.      Pre-operative 5 CHG Bathing Instructions   You can play a key role in reducing the risk of infection after surgery. Your skin needs to be as free of germs as possible. You can reduce the number of germs on your skin by washing with CHG (chlorhexidine gluconate) soap before surgery. CHG is an antiseptic soap that kills germs and continues to kill germs even after washing.   DO NOT use if you have an  allergy to chlorhexidine/CHG or antibacterial soaps. If your skin becomes reddened or irritated, stop using the CHG and notify one of our RNs at [phone].   Please shower with the CHG soap starting 4 days before surgery using the following schedule:     Please keep in mind the following:  DO NOT shave, including legs and underarms, starting the day of your first shower.   You may shave your face at any point before/day of surgery.  Place clean sheets on your bed the day you start using CHG soap. Use a clean washcloth (not used since being washed) for each shower. DO NOT sleep with pets once you start using the CHG.   CHG Shower Instructions:  Wash your face and private area with normal soap. If you choose to wash your  hair, wash first with your normal shampoo.  After you use shampoo/soap, rinse your hair and body thoroughly to remove shampoo/soap residue.  Turn the water OFF and apply about 3 tablespoons (45 ml) of CHG soap to a CLEAN washcloth.  Apply CHG soap ONLY FROM YOUR NECK DOWN TO YOUR TOES (washing for 3-5 minutes)  DO NOT use CHG soap on face, private areas, open wounds, or sores.  Pay special attention to the area where your surgery is being performed.  If you are having back surgery, having someone wash your back for you may be helpful. Wait 2 minutes after CHG soap is applied, then you may rinse off the CHG soap.  Pat dry with a clean towel  Put on clean clothes/pajamas   If you choose to wear lotion, please use ONLY the CHG-compatible lotions that are listed below.  Additional instructions for the day of surgery: DO NOT APPLY any lotions, deodorants, cologne, or perfumes.   Do not bring valuables to the hospital. [facility] is not responsible for any belongings/valuables. Do not wear nail polish, gel polish, artificial nails, or any other type of covering on natural nails (fingers and toes) Do not wear jewelry or makeup Put on clean/comfortable clothes.  Please  brush your teeth.  Ask your nurse before applying any prescription medications to the skin.     CHG Compatible Lotions   Aveeno Moisturizing lotion  Cetaphil Moisturizing Cream  Cetaphil Moisturizing Lotion  Clairol Herbal Essence Moisturizing Lotion, Dry Skin  Clairol Herbal Essence Moisturizing Lotion, Extra Dry Skin  Clairol Herbal Essence Moisturizing Lotion, Normal Skin  Curel Age Defying Therapeutic Moisturizing Lotion with Alpha Hydroxy  Curel Extreme Care Body Lotion  Curel Soothing Hands Moisturizing Hand Lotion  Curel Therapeutic Moisturizing Cream, Fragrance-Free  Curel Therapeutic Moisturizing Lotion, Fragrance-Free  Curel Therapeutic Moisturizing Lotion, Original Formula  Eucerin Daily Replenishing Lotion  Eucerin Dry Skin Therapy Plus Alpha Hydroxy Crme  Eucerin Dry Skin Therapy Plus Alpha Hydroxy Lotion  Eucerin Original Crme  Eucerin Original Lotion  Eucerin Plus Crme Eucerin Plus Lotion  Eucerin TriLipid Replenishing Lotion  Keri Anti-Bacterial Hand Lotion  Keri Deep Conditioning Original Lotion Dry Skin Formula Softly Scented  Keri Deep Conditioning Original Lotion, Fragrance Free Sensitive Skin Formula  Keri Lotion Fast Absorbing Fragrance Free Sensitive Skin Formula  Keri Lotion Fast Absorbing Softly Scented Dry Skin Formula  Keri Original Lotion  Keri Skin Renewal Lotion Keri Silky Smooth Lotion  Keri Silky Smooth Sensitive Skin Lotion  Nivea Body Creamy Conditioning Oil  Nivea Body Extra Enriched Lotion  Nivea Body Original Lotion  Nivea Body Sheer Moisturizing Lotion Nivea Crme  Nivea Skin Firming Lotion  NutraDerm 30 Skin Lotion  NutraDerm Skin Lotion  NutraDerm Therapeutic Skin Cream  NutraDerm Therapeutic Skin Lotion  ProShield Protective Hand Cream  Provon moisturizing lotion  Please read over the following fact sheets that you were given.

## 2023-09-19 ENCOUNTER — Encounter (HOSPITAL_COMMUNITY): Payer: Self-pay

## 2023-09-19 ENCOUNTER — Encounter (HOSPITAL_COMMUNITY)
Admission: RE | Admit: 2023-09-19 | Discharge: 2023-09-19 | Disposition: A | Discharge status: Home / Self Care | Source: Ambulatory Visit | Attending: Orthopaedic Surgery | Admitting: Orthopaedic Surgery

## 2023-09-19 ENCOUNTER — Other Ambulatory Visit: Payer: Self-pay

## 2023-09-19 VITALS — BP 135/97 | HR 84 | Temp 98.6°F | Resp 18 | Ht 65.0 in | Wt 188.4 lb

## 2023-09-19 DIAGNOSIS — M1612 Unilateral primary osteoarthritis, left hip: Secondary | ICD-10-CM | POA: Insufficient documentation

## 2023-09-19 DIAGNOSIS — I10 Essential (primary) hypertension: Secondary | ICD-10-CM | POA: Insufficient documentation

## 2023-09-19 DIAGNOSIS — G252 Other specified forms of tremor: Secondary | ICD-10-CM | POA: Diagnosis not present

## 2023-09-19 DIAGNOSIS — Z96653 Presence of artificial knee joint, bilateral: Secondary | ICD-10-CM | POA: Insufficient documentation

## 2023-09-19 DIAGNOSIS — Z01812 Encounter for preprocedural laboratory examination: Secondary | ICD-10-CM | POA: Diagnosis not present

## 2023-09-19 DIAGNOSIS — F17210 Nicotine dependence, cigarettes, uncomplicated: Secondary | ICD-10-CM | POA: Insufficient documentation

## 2023-09-19 DIAGNOSIS — Z7984 Long term (current) use of oral hypoglycemic drugs: Secondary | ICD-10-CM | POA: Insufficient documentation

## 2023-09-19 DIAGNOSIS — K5730 Diverticulosis of large intestine without perforation or abscess without bleeding: Secondary | ICD-10-CM | POA: Insufficient documentation

## 2023-09-19 DIAGNOSIS — I69354 Hemiplegia and hemiparesis following cerebral infarction affecting left non-dominant side: Secondary | ICD-10-CM | POA: Diagnosis not present

## 2023-09-19 DIAGNOSIS — E119 Type 2 diabetes mellitus without complications: Secondary | ICD-10-CM | POA: Insufficient documentation

## 2023-09-19 DIAGNOSIS — Z79899 Other long term (current) drug therapy: Secondary | ICD-10-CM | POA: Diagnosis not present

## 2023-09-19 DIAGNOSIS — Z01818 Encounter for other preprocedural examination: Secondary | ICD-10-CM

## 2023-09-19 DIAGNOSIS — K219 Gastro-esophageal reflux disease without esophagitis: Secondary | ICD-10-CM | POA: Diagnosis not present

## 2023-09-19 DIAGNOSIS — E042 Nontoxic multinodular goiter: Secondary | ICD-10-CM | POA: Diagnosis not present

## 2023-09-19 HISTORY — DX: Personal history of pneumonia (recurrent): Z87.01

## 2023-09-19 LAB — BASIC METABOLIC PANEL WITH GFR
Anion gap: 6 (ref 5–15)
BUN: 12 mg/dL (ref 8–23)
CO2: 28 mmol/L (ref 22–32)
Calcium: 8.6 mg/dL — ABNORMAL LOW (ref 8.9–10.3)
Chloride: 106 mmol/L (ref 98–111)
Creatinine, Ser: 0.78 mg/dL (ref 0.44–1.00)
GFR, Estimated: >60 mL/min (ref >60)
Glucose, Bld: 136 mg/dL — ABNORMAL HIGH (ref 70–99)
Potassium: 3.5 mmol/L (ref 3.5–5.1)
Sodium: 140 mmol/L (ref 135–145)

## 2023-09-19 LAB — TYPE AND SCREEN
ABO/RH(D): O POS
Antibody Screen: NEGATIVE

## 2023-09-19 LAB — CBC
HCT: 39.4 % (ref 36.0–46.0)
Hemoglobin: 13.1 g/dL (ref 12.0–15.0)
MCH: 31.6 pg (ref 26.0–34.0)
MCHC: 33.2 g/dL (ref 30.0–36.0)
MCV: 94.9 fL (ref 80.0–100.0)
Platelets: 231 10*3/uL (ref 150–400)
RBC: 4.15 MIL/uL (ref 3.87–5.11)
RDW: 12.5 % (ref 11.5–15.5)
WBC: 4.6 10*3/uL (ref 4.0–10.5)
nRBC: 0 % (ref 0.0–0.2)

## 2023-09-19 LAB — GLUCOSE, CAPILLARY: Glucose-Capillary: 194 mg/dL — ABNORMAL HIGH (ref 70–99)

## 2023-09-19 LAB — SURGICAL PCR SCREEN
MRSA, PCR: NEGATIVE
Staphylococcus aureus: POSITIVE — AB

## 2023-09-19 NOTE — Progress Notes (Signed)
 PCP - Dr. Elberta Fortis Cardiologist - denies  PPM/ICD - denies Device Orders - n/a Rep Notified -  n/a  Chest x-ray - denies EKG - 03/31/23 Stress Test - per patient was 20 years ago ECHO - denies Cardiac Cath - denies  Sleep Study - denies - pt states she was supposed to go for one but did not have a ride CPAP - n/a  Pt. Has DM2 but does not check blood sugars at home.  She states that she has never been told she needs to and does not have a meter.  Patient does not know what her fasting glucose is.   Her A1C was 6.1 on 09/02/23  Last dose of GLP1 agonist-  n/a GLP1 instructions: n/a  Blood Thinner Instructions: n/a Aspirin Instructions:  patient states that she was told to continue taking her Aspirin.    ERAS Protcol - clears until 715 PRE-SURGERY Ensure or G2-  G2 as ordered  COVID TEST- n/a   Anesthesia review: yes - history of CVA with residual left sided weakness, HTN, DM2  Patient denies shortness of breath, fever, cough and chest pain at PAT appointment   All instructions explained to the patient, with a verbal understanding of the material. Patient agrees to go over the instructions while at home for a better understanding. Patient also instructed to self quarantine after being tested for COVID-19. The opportunity to ask questions was provided.

## 2023-09-20 NOTE — Anesthesia Preprocedure Evaluation (Addendum)
 Anesthesia Evaluation  Patient identified by MRN, date of birth, ID band Patient awake    Reviewed: Allergy & Precautions, NPO status , Patient's Chart, lab work & pertinent test results  Airway Mallampati: III  TM Distance: >3 FB Neck ROM: Full    Dental  (+) Edentulous Upper, Dental Advisory Given   Pulmonary Current Smoker and Patient abstained from smoking.   breath sounds clear to auscultation       Cardiovascular hypertension, Pt. on medications  Rhythm:Regular Rate:Normal     Neuro/Psych  Headaches PSYCHIATRIC DISORDERS  Depression    CVA, Residual Symptoms    GI/Hepatic Neg liver ROS,GERD  Medicated,,  Endo/Other  diabetes, Type 2, Oral Hypoglycemic Agents    Renal/GU negative Renal ROS     Musculoskeletal  (+) Arthritis ,    Abdominal   Peds  Hematology negative hematology ROS (+)   Anesthesia Other Findings   Reproductive/Obstetrics                             Anesthesia Physical Anesthesia Plan  ASA: 3  Anesthesia Plan: Spinal   Post-op Pain Management: Tylenol PO (pre-op)*   Induction: Intravenous  PONV Risk Score and Plan: 2 and Ondansetron, Dexamethasone, Midazolam and Propofol infusion  Airway Management Planned: Natural Airway and Nasal Cannula  Additional Equipment: None  Intra-op Plan:   Post-operative Plan:   Informed Consent: I have reviewed the patients History and Physical, chart, labs and discussed the procedure including the risks, benefits and alternatives for the proposed anesthesia with the patient or authorized representative who has indicated his/her understanding and acceptance.       Plan Discussed with: CRNA  Anesthesia Plan Comments: (PAT note written 09/20/2023 by Shonna Chock, PA-C.  )       Anesthesia Quick Evaluation

## 2023-09-20 NOTE — Progress Notes (Addendum)
 Anesthesia Chart Review:  Case: [id number] Date/Time: 09/30/23 1000   Procedure: LEFT TOTAL HIP ARTHROPLASTY ANTERIOR APPROACH (Left: Hip) - 3-C   Anesthesia type: Spinal   Pre-op diagnosis: left hip osteoarthritis   [facility] ROOM 09 / [facility]   Surgeons: Tarry Kos, MD       DISCUSSION: Patient is a 68 year old female scheduled for the above procedure.     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    She had PCP Elberta Fortis, MD follow-up on 09/02/23 for follow-up of chronic medical conditions and for preoperative risk assessment. A1c 6.1%. Smoking decreased to 3-4 cigarettes/day. LCS Chest CT ordered. In regards to surgery, "Planning for total left hip replacement on/12/2023.  Patient is low risk Monica Watson score: 0.2%) for complications for an intermediate risk surgery.  -Recommend holding losartan the day of the surgery and continuing all medication including aspirin."  Anesthesia team to evaluate on the day of surgery.    VS: BP (!) 135/97   Pulse 84   Temp 37 C   Resp 18   Ht 5\' 5"  (1.651 m)   Wt 85.5 kg   SpO2 98%   BMI 31.35 kg/m    PROVIDERS: Elberta Fortis, MD is PCP  Janet Berlin, MD is neurologist ([facility])  Scarlette Ar, MD is ENT ([facility])   LABS: Labs reviewed: Acceptable for surgery. A1c 6.1% on 09/02/23.  (all labs ordered are listed, but only abnormal results are displayed)  Labs Reviewed  SURGICAL PCR SCREEN - Abnormal; Notable for the following components:      Result Value   Staphylococcus aureus POSITIVE (*)    All other components within normal limits  GLUCOSE, CAPILLARY - Abnormal; Notable for the following components:    Glucose-Capillary 194 (*)    All other components within normal limits  BASIC METABOLIC PANEL WITH GFR - Abnormal; Notable for the following components:   Glucose, Bld 136 (*)    Calcium 8.6 (*)    All other components within normal limits  CBC  TYPE AND SCREEN     IMAGES: US Thyroid 07/29/23: IMPRESSION: 1. Post right thyroid lobectomy without evidence of locally recurrent or residual disease/tissue. 2. Similar findings of multinodular goiter within the remaining thyroid parenchyma. 3. Previously biopsied spongiform/benign-appearing nodule within the superior pole of the left lobe of the thyroid (labeled 1), has decreased in size compared to the 03/2021 examination. Correlation with previous biopsy results is advised. Assuming a benign pathologic diagnosis, repeat sampling and/or continued dedicated follow-up is not recommended. 4. Previously biopsied 4.2 cm nodule/pseudonodule within the inferior pole of the left lobe of the thyroid (labeled 3), has increased in size compared to the 03/2021 examination though size differences attributable to interval partial cystic degeneration, a typically benign finding. Correlation with previous biopsy results is advised. Assuming a benign pathologic diagnosis, repeat sampling and/or continued dedicated follow-up is not recommended. 5. None of the additional thyroid nodules/cysts meet imaging criteria to recommend percutaneous sampling or continued dedicated follow-up. - The above is in keeping with the ACR TI-RADS recommendations - J Am Coll Radiol 2017;14:587-595.   CT Abd/pelvis 07/19/23: IMPRESSION: 1. No acute findings in the abdomen or pelvis. 2. Prominent periuterine and adnexal vascularity with dilatation of the ovarian veins can be seen with pelvic congestion syndrome  in the appropriate clinical setting. 3. Stable CT appearance of the right adnexal lesion, previously characterized on MRI. 4. Minimal colonic diverticulosis without  diverticulitis.     EKG: 04/01/23: Normal sinus rhythm Cannot rule out anterior infarct, age undetermined     CV: She reported a stress test greater than 20 years ago. She likely had an echo as part of her CVA work-up in 2010. Records are not currently available.  Past Medical History:  Diagnosis Date   Arthritis    Cerebrovascular disease    Depression    GERD (gastroesophageal reflux disease)    Headache    Migraines   History of pneumonia    Hypertension    Stroke [facility]) 2010   Left sided weakness   Type 2 diabetes mellitus (HCC)     Past Surgical History:  Procedure Laterality Date   bilateral knee replacements      CATARACT EXTRACTION, BILATERAL     FRACTURE SURGERY Right    arm as a child   gallstones removed     I & D KNEE WITH POLY EXCHANGE Right 12/16/2020   Procedure: POLY EXCHANGE RIGHT KNEE;  Surgeon: Kathryne Hitch, MD;  Location: [facility] ORS;  Service: Orthopedics;  Laterality: Right;   KNEE ARTHROSCOPY Bilateral    left carpal tunnel release      PAROTIDECTOMY Left 02/20/2023   Procedure: SUPERFICIAL PAROTIDECTOMY WITH FACIAL NERVE MONITORING;  Surgeon: Scarlette Ar, MD;  Location: [facility] OR;  Service: ENT;  Laterality: Left;   REVERSE SHOULDER ARTHROPLASTY Left 04/23/2022   Procedure: LEFT REVERSE SHOULDER ARTHROPLASTY;  Surgeon: Huel Cote, MD;  Location: [facility] OR;  Service: Orthopedics;  Laterality: Left;   SHOULDER ARTHROSCOPY Bilateral    THYROIDECTOMY      MEDICATIONS:  aspirin (ASPIRIN 81) 81 MG chewable tablet   docusate sodium (COLACE) 100 MG capsule   doxycycline (VIBRAMYCIN) 100 MG capsule   methocarbamol (ROBAXIN-750) 750 MG tablet   ondansetron (ZOFRAN) 4 MG tablet   oxyCODONE-acetaminophen (PERCOCET) 5-325 MG tablet   aspirin EC 81 MG tablet   atorvastatin (LIPITOR) 40 MG tablet   Azelastine-Fluticasone 137-50 MCG/ACT SUSP   calcium-vitamin D (OSCAL WITH D) 500-5 MG-MCG tablet   clonazePAM (KLONOPIN) 0.25 MG disintegrating tablet    gabapentin (NEURONTIN) 800 MG tablet   losartan (COZAAR) 50 MG tablet   metFORMIN (GLUCOPHAGE-XR) 500 MG 24 hr tablet   montelukast (SINGULAIR) 10 MG tablet   omeprazole (PRILOSEC) 20 MG capsule   oxyCODONE-acetaminophen (PERCOCET) 10-325 MG tablet   polyethylene glycol (MIRALAX) 17 g packet   psyllium (METAMUCIL) 28 % packet   tiZANidine (ZANAFLEX) 4 MG tablet   trolamine salicylate (ASPERCREME) 10 % cream   No current facility-administered medications for this encounter.  Doxycycline is a post-operative order.   Shonna Chock, PA-C Surgical Short Stay/Anesthesiology [facility] Phone [phone] [facility] Phone [phone] 09/20/2023 4:43 PM

## 2023-09-24 ENCOUNTER — Ambulatory Visit (HOSPITAL_COMMUNITY)
Admission: RE | Admit: 2023-09-24 | Discharge: 2023-09-24 | Disposition: A | Discharge status: Home / Self Care | Source: Ambulatory Visit | Attending: Family Medicine | Admitting: Family Medicine

## 2023-09-24 DIAGNOSIS — Z720 Tobacco use: Secondary | ICD-10-CM | POA: Insufficient documentation

## 2023-09-24 DIAGNOSIS — I700 Atherosclerosis of aorta: Secondary | ICD-10-CM | POA: Insufficient documentation

## 2023-09-24 DIAGNOSIS — Z122 Encounter for screening for malignant neoplasm of respiratory organs: Secondary | ICD-10-CM | POA: Insufficient documentation

## 2023-09-24 DIAGNOSIS — J439 Emphysema, unspecified: Secondary | ICD-10-CM | POA: Insufficient documentation

## 2023-09-24 DIAGNOSIS — I2510 Atherosclerotic heart disease of native coronary artery without angina pectoris: Secondary | ICD-10-CM | POA: Insufficient documentation

## 2023-09-24 DIAGNOSIS — F17210 Nicotine dependence, cigarettes, uncomplicated: Secondary | ICD-10-CM | POA: Insufficient documentation

## 2023-09-25 ENCOUNTER — Ambulatory Visit (INDEPENDENT_AMBULATORY_CARE_PROVIDER_SITE_OTHER): Admitting: Podiatry

## 2023-09-25 DIAGNOSIS — B351 Tinea unguium: Secondary | ICD-10-CM | POA: Diagnosis not present

## 2023-09-25 DIAGNOSIS — M79675 Pain in left toe(s): Secondary | ICD-10-CM | POA: Diagnosis not present

## 2023-09-25 DIAGNOSIS — M79674 Pain in right toe(s): Secondary | ICD-10-CM

## 2023-09-25 MED ORDER — CICLOPIROX 8 % EX SOLN: Freq: Every day | CUTANEOUS | 0 refills | Status: AC

## 2023-09-25 NOTE — Progress Notes (Signed)
 Subjective:  Patient ID: Monica Watson, female    DOB: 06-Jan-1956,  MRN: [id number]  Chief Complaint  Patient presents with   Nail Problem    68 y.o. female presents with the above complaint.  Patient presents with thickened onychodystrophy mycotic toenails x 10 mild pain on palpation.  She would like to discuss treatment options for nail fungus she would like to have a topical medication denies any other acute complaints.   Review of Systems: Negative except as noted in the HPI. Denies N/V/F/Ch.  Past Medical History:  Diagnosis Date   Arthritis    Cerebrovascular disease    Depression    GERD (gastroesophageal reflux disease)    Headache    Migraines   History of pneumonia    Hypertension    Stroke [facility]) 2010   Left sided weakness   Type 2 diabetes mellitus (HCC)     Current Outpatient Medications:    aspirin (ASPIRIN 81) 81 MG chewable tablet, Chew 1 tablet (81 mg total) by mouth 2 (two) times daily. To be taken after surgery to prevent blood clots, Disp: 84 tablet, Rfl: 0   ciclopirox (PENLAC) 8 % solution, Apply topically at bedtime. Apply over nail and surrounding skin. Apply daily over previous coat. After seven (7) days, may remove with alcohol and continue cycle., Disp: 6.6 mL, Rfl: 0   docusate sodium (COLACE) 100 MG capsule, Take 1 capsule (100 mg total) by mouth daily as needed., Disp: 30 capsule, Rfl: 2   doxycycline (VIBRAMYCIN) 100 MG capsule, Take 1 capsule (100 mg total) by mouth 2 (two) times daily. To be taken after surgery, Disp: 20 capsule, Rfl: 0   methocarbamol (ROBAXIN-750) 750 MG tablet, Take 1 tablet (750 mg total) by mouth 3 (three) times daily as needed for muscle spasms., Disp: 30 tablet, Rfl: 2   ondansetron (ZOFRAN) 4 MG tablet, Take 1 tablet (4 mg total) by mouth every 8 (eight) hours as needed for nausea or vomiting., Disp: 40 tablet, Rfl: 0   oxyCODONE-acetaminophen (PERCOCET) 5-325 MG tablet, Take 1-2 tablets by mouth every 6 (six) hours as  needed. To be taken after surgery, Disp: 40 tablet, Rfl: 0   aspirin EC 81 MG tablet, Take 1 tablet (81 mg total) by mouth daily. Swallow whole., Disp: 90 tablet, Rfl: 1   atorvastatin (LIPITOR) 40 MG tablet, Take 40 mg by mouth daily., Disp: , Rfl:    Azelastine-Fluticasone 137-50 MCG/ACT SUSP, Place 1 spray into the nose 2 (two) times daily. (Patient taking differently: Place 1 spray into the nose 2 (two) times daily as needed (allergies).), Disp: 23 g, Rfl: 3   calcium-vitamin D (OSCAL WITH D) 500-5 MG-MCG tablet, Take 1 tablet by mouth daily with breakfast. (Patient not taking: Reported on 09/16/2023), Disp: 90 tablet, Rfl: 3   clonazePAM (KLONOPIN) 0.25 MG disintegrating tablet, Take 0.25 mg by mouth 2 (two) times daily as needed (restless legs)., Disp: , Rfl:    gabapentin (NEURONTIN) 800 MG tablet, Take 1 tablet (800 mg total) by mouth at bedtime., Disp: 30 tablet, Rfl: 0   losartan (COZAAR) 50 MG tablet, Take 1 tablet (50 mg total) by mouth daily., Disp: 90 tablet, Rfl: 3   metFORMIN (GLUCOPHAGE-XR) 500 MG 24 hr tablet, Take one tablet daily for a week. If you do well with that, increase to one tablet in the morning and one in the evening. (Patient taking differently: Take 500 mg by mouth daily as needed (high blood sugar).), Disp: 180 tablet, Rfl: 3  montelukast (SINGULAIR) 10 MG tablet, Take 1 tablet (10 mg total) by mouth at bedtime., Disp: 30 tablet, Rfl: 1   omeprazole (PRILOSEC) 20 MG capsule, TAKE ONE CAPSULE BY MOUTH ONCE DAILY FOR ACID REFLUX (Patient taking differently: Take 20 mg by mouth daily as needed (acid reflux).), Disp: 90 capsule, Rfl: 1   oxyCODONE-acetaminophen (PERCOCET) 10-325 MG tablet, Take 1 tablet by mouth every 6 (six) hours as needed for pain., Disp: , Rfl:    polyethylene glycol (MIRALAX) 17 g packet, Take 17 g by mouth daily. (Patient taking differently: Take 17 g by mouth daily as needed for moderate constipation.), Disp: 90 each, Rfl: 0   psyllium (METAMUCIL) 28  % packet, Take 1 packet by mouth 2 (two) times daily. (Patient taking differently: Take 1 packet by mouth daily as needed (constipation).), Disp: 30 each, Rfl: 1   tiZANidine (ZANAFLEX) 4 MG tablet, Take 1 tablet (4 mg total) by mouth every 8 (eight) hours as needed for muscle spasms., Disp: 30 tablet, Rfl: 0   trolamine salicylate (ASPERCREME) 10 % cream, Apply 1 Application topically as needed for muscle pain., Disp: , Rfl:   Social History   Tobacco Use  Smoking Status Every Day   Current packs/day: 0.50   Average packs/day: 0.5 packs/day for 46.0 years (23.0 ttl pk-yrs)   Types: Cigarettes   Passive exposure: Current  Smokeless Tobacco Never    No Known Allergies Objective:  There were no vitals filed for this visit. There is no height or weight on file to calculate BMI. Constitutional Well developed. Well nourished.  Vascular Dorsalis pedis pulses palpable bilaterally. Posterior tibial pulses palpable bilaterally. Capillary refill normal to all digits.  No cyanosis or clubbing noted. Pedal hair growth normal.  Neurologic Normal speech. Oriented to person, place, and time. Epicritic sensation to light touch grossly present bilaterally.  Dermatologic Nails thickened onychodystrophy mycotic toenails x 10 Skin within normal limits  Orthopedic: Normal joint ROM without pain or crepitus bilaterally. No visible deformities. No bony tenderness.   Radiographs: None Assessment:  No diagnosis found. Plan:  Patient was evaluated and treated and all questions answered.  Onychomycosis toenails x 10 -Educated the patient on the etiology of onychomycosis and various treatment options associated with improving the fungal load.  I explained to the patient that there is 3 treatment options available to treat the onychomycosis including topical, p.o., laser treatment.  Patient will benefit from topical medication Penlac was sent to the pharmacy.  No acute complaints advised to apply twice  a day she states understanding   No follow-ups on file.

## 2023-09-29 DIAGNOSIS — M1612 Unilateral primary osteoarthritis, left hip: Secondary | ICD-10-CM | POA: Insufficient documentation

## 2023-09-30 ENCOUNTER — Observation Stay (HOSPITAL_COMMUNITY)

## 2023-09-30 ENCOUNTER — Other Ambulatory Visit: Payer: Self-pay

## 2023-09-30 ENCOUNTER — Encounter (HOSPITAL_COMMUNITY)
Admission: RE | Disposition: A | Discharge status: Home / Self Care | Payer: Self-pay | Source: Ambulatory Visit | Attending: Orthopaedic Surgery

## 2023-09-30 ENCOUNTER — Ambulatory Visit (HOSPITAL_COMMUNITY): Payer: Self-pay | Admitting: Vascular Surgery

## 2023-09-30 ENCOUNTER — Ambulatory Visit (HOSPITAL_COMMUNITY)

## 2023-09-30 ENCOUNTER — Ambulatory Visit (HOSPITAL_COMMUNITY)
Admission: RE | Admit: 2023-09-30 | Discharge: 2023-10-01 | Disposition: A | Discharge status: Home / Self Care | Payer: 59 | Source: Ambulatory Visit | Attending: Orthopaedic Surgery | Admitting: Orthopaedic Surgery

## 2023-09-30 ENCOUNTER — Ambulatory Visit (HOSPITAL_BASED_OUTPATIENT_CLINIC_OR_DEPARTMENT_OTHER): Admitting: Anesthesiology

## 2023-09-30 DIAGNOSIS — Z79899 Other long term (current) drug therapy: Secondary | ICD-10-CM | POA: Insufficient documentation

## 2023-09-30 DIAGNOSIS — F17210 Nicotine dependence, cigarettes, uncomplicated: Secondary | ICD-10-CM | POA: Diagnosis not present

## 2023-09-30 DIAGNOSIS — Z01818 Encounter for other preprocedural examination: Secondary | ICD-10-CM

## 2023-09-30 DIAGNOSIS — Z96642 Presence of left artificial hip joint: Secondary | ICD-10-CM | POA: Insufficient documentation

## 2023-09-30 DIAGNOSIS — M1612 Unilateral primary osteoarthritis, left hip: Secondary | ICD-10-CM | POA: Diagnosis not present

## 2023-09-30 DIAGNOSIS — I10 Essential (primary) hypertension: Secondary | ICD-10-CM | POA: Diagnosis not present

## 2023-09-30 DIAGNOSIS — E119 Type 2 diabetes mellitus without complications: Secondary | ICD-10-CM | POA: Diagnosis not present

## 2023-09-30 HISTORY — PX: TOTAL HIP ARTHROPLASTY: SHX124

## 2023-09-30 LAB — GLUCOSE, CAPILLARY
Glucose-Capillary: 100 mg/dL — ABNORMAL HIGH (ref 70–99)
Glucose-Capillary: 80 mg/dL (ref 70–99)
Glucose-Capillary: 97 mg/dL (ref 70–99)
Glucose-Capillary: 238 mg/dL — ABNORMAL HIGH (ref 70–99)
Glucose-Capillary: 214 mg/dL — ABNORMAL HIGH (ref 70–99)

## 2023-09-30 LAB — HEMOGLOBIN A1C
Hgb A1c MFr Bld: 6.3 % — ABNORMAL HIGH (ref 4.8–5.6)
Mean Plasma Glucose: 134.11 mg/dL

## 2023-09-30 SURGERY — ARTHROPLASTY, HIP, TOTAL, ANTERIOR APPROACH
Anesthesia: Spinal | Site: Hip | Laterality: Left

## 2023-09-30 MED ORDER — DEXAMETHASONE SODIUM PHOSPHATE 10 MG/ML IJ SOLN
10.0000 mg | Freq: Once | INTRAMUSCULAR | Status: AC
  Administered 2023-10-01: 10 mg via INTRAVENOUS
  Filled 2023-09-30: qty 1

## 2023-09-30 MED ORDER — ONDANSETRON HCL 4 MG PO TABS: 4.0000 mg | ORAL_TABLET | Freq: Four times a day (QID) | ORAL | Status: DC | PRN

## 2023-09-30 MED ORDER — DOXYCYCLINE HYCLATE 100 MG PO TABS
100.0000 mg | ORAL_TABLET | Freq: Two times a day (BID) | ORAL | Status: DC
  Administered 2023-10-01: 100 mg via ORAL
  Filled 2023-09-30: qty 1

## 2023-09-30 MED ORDER — CHLORHEXIDINE GLUCONATE 0.12 % MT SOLN: 15.0000 mL | Freq: Once | OROMUCOSAL | Status: DC

## 2023-09-30 MED ORDER — DROPERIDOL 2.5 MG/ML IJ SOLN: 0.6250 mg | Freq: Once | INTRAMUSCULAR | Status: DC | PRN

## 2023-09-30 MED ORDER — METOCLOPRAMIDE HCL 5 MG PO TABS: 5.0000 mg | ORAL_TABLET | Freq: Three times a day (TID) | ORAL | Status: DC | PRN

## 2023-09-30 MED ORDER — ONDANSETRON HCL 4 MG/2ML IJ SOLN
INTRAMUSCULAR | Status: DC | PRN
  Administered 2023-09-30: 4 mg via INTRAVENOUS

## 2023-09-30 MED ORDER — MAGNESIUM CITRATE PO SOLN: 1.0000 | Freq: Once | ORAL | Status: DC | PRN

## 2023-09-30 MED ORDER — ACETAMINOPHEN 10 MG/ML IV SOLN: 1000.0000 mg | Freq: Once | INTRAVENOUS | Status: DC | PRN

## 2023-09-30 MED ORDER — ONDANSETRON HCL 4 MG/2ML IJ SOLN: 4.0000 mg | Freq: Four times a day (QID) | INTRAMUSCULAR | Status: DC | PRN

## 2023-09-30 MED ORDER — CEFAZOLIN SODIUM-DEXTROSE 2-4 GM/100ML-% IV SOLN
2.0000 g | INTRAVENOUS | Status: AC
  Administered 2023-09-30: 2 g via INTRAVENOUS

## 2023-09-30 MED ORDER — SODIUM CHLORIDE 0.9 % IR SOLN
Status: DC | PRN
  Administered 2023-09-30: 1000 mL

## 2023-09-30 MED ORDER — INSULIN ASPART 100 UNIT/ML IJ SOLN: 0.0000 [IU] | INTRAMUSCULAR | Status: DC | PRN

## 2023-09-30 MED ORDER — METHOCARBAMOL 500 MG PO TABS
500.0000 mg | ORAL_TABLET | Freq: Four times a day (QID) | ORAL | Status: DC | PRN
  Administered 2023-09-30 – 2023-10-01 (×3): 500 mg via ORAL
  Filled 2023-09-30 (×3): qty 1

## 2023-09-30 MED ORDER — HYDROMORPHONE HCL 1 MG/ML IJ SOLN: 0.5000 mg | INTRAMUSCULAR | Status: DC | PRN

## 2023-09-30 MED ORDER — INSULIN ASPART 100 UNIT/ML IJ SOLN
0.0000 [IU] | Freq: Three times a day (TID) | INTRAMUSCULAR | Status: DC
  Administered 2023-09-30: 5 [IU] via SUBCUTANEOUS
  Administered 2023-10-01 (×2): 3 [IU] via SUBCUTANEOUS

## 2023-09-30 MED ORDER — ACETAMINOPHEN 500 MG PO TABS
1000.0000 mg | ORAL_TABLET | Freq: Four times a day (QID) | ORAL | Status: AC
  Administered 2023-09-30 – 2023-10-01 (×4): 1000 mg via ORAL
  Filled 2023-09-30 (×4): qty 2

## 2023-09-30 MED ORDER — BUPIVACAINE-MELOXICAM ER 400-12 MG/14ML IJ SOLN
INTRAMUSCULAR | Status: DC | PRN
  Administered 2023-09-30: 400 mg

## 2023-09-30 MED ORDER — GABAPENTIN 400 MG PO CAPS: 800.0000 mg | ORAL_CAPSULE | Freq: Every day | ORAL | Status: DC

## 2023-09-30 MED ORDER — 0.9 % SODIUM CHLORIDE (POUR BTL) OPTIME
TOPICAL | Status: DC | PRN
  Administered 2023-09-30: 1000 mL

## 2023-09-30 MED ORDER — LACTATED RINGERS IV SOLN: INTRAVENOUS | Status: DC

## 2023-09-30 MED ORDER — PHENOL 1.4 % MT LIQD: 1.0000 | OROMUCOSAL | Status: DC | PRN

## 2023-09-30 MED ORDER — MENTHOL 3 MG MT LOZG: 1.0000 | LOZENGE | OROMUCOSAL | Status: DC | PRN

## 2023-09-30 MED ORDER — VANCOMYCIN HCL 1 G IV SOLR
INTRAVENOUS | Status: DC | PRN
  Administered 2023-09-30: 1000 mg via TOPICAL

## 2023-09-30 MED ORDER — MIDAZOLAM HCL 2 MG/2ML IJ SOLN
INTRAMUSCULAR | Status: AC
  Filled 2023-09-30: qty 2

## 2023-09-30 MED ORDER — OXYCODONE HCL ER 10 MG PO T12A
10.0000 mg | EXTENDED_RELEASE_TABLET | Freq: Two times a day (BID) | ORAL | Status: DC
  Administered 2023-09-30 – 2023-10-01 (×3): 10 mg via ORAL
  Filled 2023-09-30 (×3): qty 1

## 2023-09-30 MED ORDER — LOSARTAN POTASSIUM 50 MG PO TABS
50.0000 mg | ORAL_TABLET | Freq: Every day | ORAL | Status: DC
Start: 2023-09-30 — End: 2023-10-01
  Administered 2023-09-30 – 2023-10-01 (×2): 50 mg via ORAL
  Filled 2023-09-30 (×2): qty 1

## 2023-09-30 MED ORDER — FENTANYL CITRATE (PF) 250 MCG/5ML IJ SOLN
INTRAMUSCULAR | Status: DC | PRN
  Administered 2023-09-30 (×3): 50 ug via INTRAVENOUS

## 2023-09-30 MED ORDER — MEPERIDINE HCL 25 MG/ML IJ SOLN: 6.2500 mg | INTRAMUSCULAR | Status: DC | PRN

## 2023-09-30 MED ORDER — TRANEXAMIC ACID 1000 MG/10ML IV SOLN
2000.0000 mg | INTRAVENOUS | Status: DC
  Filled 2023-09-30: qty 20

## 2023-09-30 MED ORDER — ACETAMINOPHEN 325 MG PO TABS: 325.0000 mg | ORAL_TABLET | Freq: Four times a day (QID) | ORAL | Status: DC | PRN

## 2023-09-30 MED ORDER — TRANEXAMIC ACID-NACL 1000-0.7 MG/100ML-% IV SOLN
INTRAVENOUS | Status: AC
  Filled 2023-09-30: qty 100

## 2023-09-30 MED ORDER — METOCLOPRAMIDE HCL 5 MG/ML IJ SOLN: 5.0000 mg | Freq: Three times a day (TID) | INTRAMUSCULAR | Status: DC | PRN

## 2023-09-30 MED ORDER — EPHEDRINE SULFATE-NACL 50-0.9 MG/10ML-% IV SOSY
PREFILLED_SYRINGE | INTRAVENOUS | Status: DC | PRN
  Administered 2023-09-30: 7.5 mg via INTRAVENOUS

## 2023-09-30 MED ORDER — OXYCODONE HCL 5 MG PO TABS
10.0000 mg | ORAL_TABLET | ORAL | Status: DC | PRN
  Administered 2023-09-30 – 2023-10-01 (×4): 10 mg via ORAL
  Filled 2023-09-30: qty 3
  Filled 2023-09-30 (×3): qty 2

## 2023-09-30 MED ORDER — POVIDONE-IODINE 10 % EX SWAB: 2.0000 | Freq: Once | CUTANEOUS | Status: DC

## 2023-09-30 MED ORDER — SORBITOL 70 % SOLN: 30.0000 mL | Freq: Every day | Status: DC | PRN

## 2023-09-30 MED ORDER — TRANEXAMIC ACID 1000 MG/10ML IV SOLN
INTRAVENOUS | Status: DC | PRN
  Administered 2023-09-30: 2000 mg via TOPICAL

## 2023-09-30 MED ORDER — MUPIROCIN 2 % EX OINT: 1.0000 | TOPICAL_OINTMENT | Freq: Two times a day (BID) | CUTANEOUS | 0 refills | Status: AC

## 2023-09-30 MED ORDER — TRANEXAMIC ACID-NACL 1000-0.7 MG/100ML-% IV SOLN
1000.0000 mg | Freq: Once | INTRAVENOUS | Status: AC
  Administered 2023-09-30: 1000 mg via INTRAVENOUS
  Filled 2023-09-30: qty 100

## 2023-09-30 MED ORDER — POLYETHYLENE GLYCOL 3350 17 G PO PACK
17.0000 g | PACK | Freq: Every day | ORAL | Status: DC
  Administered 2023-10-01: 17 g via ORAL
  Filled 2023-09-30: qty 1

## 2023-09-30 MED ORDER — PHENYLEPHRINE 80 MCG/ML (10ML) SYRINGE FOR IV PUSH (FOR BLOOD PRESSURE SUPPORT)
PREFILLED_SYRINGE | INTRAVENOUS | Status: DC | PRN
  Administered 2023-09-30 (×3): 120 ug via INTRAVENOUS

## 2023-09-30 MED ORDER — ALUM & MAG HYDROXIDE-SIMETH 200-200-20 MG/5ML PO SUSP
30.0000 mL | ORAL | Status: DC | PRN
  Administered 2023-10-01: 30 mL via ORAL
  Filled 2023-09-30: qty 30

## 2023-09-30 MED ORDER — OXYCODONE HCL 5 MG PO TABS: 5.0000 mg | ORAL_TABLET | ORAL | Status: DC | PRN

## 2023-09-30 MED ORDER — PROPOFOL 500 MG/50ML IV EMUL
INTRAVENOUS | Status: DC | PRN
  Administered 2023-09-30: 20 mg via INTRAVENOUS
  Administered 2023-09-30: 30 mg via INTRAVENOUS
  Administered 2023-09-30: 80 ug/kg/min via INTRAVENOUS

## 2023-09-30 MED ORDER — PHENYLEPHRINE HCL-NACL 20-0.9 MG/250ML-% IV SOLN
INTRAVENOUS | Status: DC | PRN
  Administered 2023-09-30: 80 ug via INTRAVENOUS
  Administered 2023-09-30: 25 ug/min via INTRAVENOUS

## 2023-09-30 MED ORDER — TRANEXAMIC ACID-NACL 1000-0.7 MG/100ML-% IV SOLN
1000.0000 mg | INTRAVENOUS | Status: AC
  Administered 2023-09-30: 1000 mg via INTRAVENOUS

## 2023-09-30 MED ORDER — INSULIN ASPART 100 UNIT/ML IJ SOLN
0.0000 [IU] | Freq: Every day | INTRAMUSCULAR | Status: DC
  Administered 2023-09-30: 2 [IU] via SUBCUTANEOUS

## 2023-09-30 MED ORDER — ASPIRIN 81 MG PO CHEW
81.0000 mg | CHEWABLE_TABLET | Freq: Two times a day (BID) | ORAL | Status: DC
  Administered 2023-09-30 – 2023-10-01 (×2): 81 mg via ORAL
  Filled 2023-09-30 (×2): qty 1

## 2023-09-30 MED ORDER — CHLORHEXIDINE GLUCONATE 4 % EX SOLN: 1.0000 | CUTANEOUS | 1 refills | Status: AC

## 2023-09-30 MED ORDER — DEXAMETHASONE SODIUM PHOSPHATE 10 MG/ML IJ SOLN
INTRAMUSCULAR | Status: DC | PRN
  Administered 2023-09-30: 8 mg via INTRAVENOUS

## 2023-09-30 MED ORDER — GLYCOPYRROLATE PF 0.2 MG/ML IJ SOSY
PREFILLED_SYRINGE | INTRAMUSCULAR | Status: DC | PRN
  Administered 2023-09-30 (×2): .1 mg via INTRAVENOUS

## 2023-09-30 MED ORDER — CEFAZOLIN SODIUM-DEXTROSE 2-4 GM/100ML-% IV SOLN
2.0000 g | Freq: Four times a day (QID) | INTRAVENOUS | Status: AC
  Administered 2023-09-30 (×2): 2 g via INTRAVENOUS
  Filled 2023-09-30 (×2): qty 100

## 2023-09-30 MED ORDER — DOCUSATE SODIUM 100 MG PO CAPS
100.0000 mg | ORAL_CAPSULE | Freq: Two times a day (BID) | ORAL | Status: DC
  Administered 2023-09-30 – 2023-10-01 (×2): 100 mg via ORAL
  Filled 2023-09-30 (×2): qty 1

## 2023-09-30 MED ORDER — HYDROMORPHONE HCL 1 MG/ML IJ SOLN
0.2500 mg | INTRAMUSCULAR | Status: DC | PRN
  Administered 2023-09-30: 0.5 mg via INTRAVENOUS

## 2023-09-30 MED ORDER — HYDROMORPHONE HCL 1 MG/ML IJ SOLN
INTRAMUSCULAR | Status: AC
  Administered 2023-09-30: 0.5 mg via INTRAVENOUS
  Filled 2023-09-30: qty 1

## 2023-09-30 MED ORDER — METHOCARBAMOL 1000 MG/10ML IJ SOLN: 500.0000 mg | Freq: Four times a day (QID) | INTRAMUSCULAR | Status: DC | PRN

## 2023-09-30 MED ORDER — LACTATED RINGERS IV SOLN
INTRAVENOUS | Status: DC
Start: 2023-09-30 — End: 2023-09-30

## 2023-09-30 MED ORDER — DIPHENHYDRAMINE HCL 12.5 MG/5ML PO ELIX: 25.0000 mg | ORAL_SOLUTION | ORAL | Status: DC | PRN

## 2023-09-30 MED ORDER — CEFAZOLIN SODIUM-DEXTROSE 2-4 GM/100ML-% IV SOLN
INTRAVENOUS | Status: AC
Start: 2023-09-30 — End: 2023-09-30
  Filled 2023-09-30: qty 100

## 2023-09-30 MED ORDER — BUPIVACAINE-MELOXICAM ER 400-12 MG/14ML IJ SOLN
INTRAMUSCULAR | Status: AC
  Filled 2023-09-30: qty 1

## 2023-09-30 MED ORDER — PANTOPRAZOLE SODIUM 40 MG PO TBEC
40.0000 mg | DELAYED_RELEASE_TABLET | Freq: Every day | ORAL | Status: DC
  Administered 2023-10-01: 40 mg via ORAL
  Filled 2023-09-30: qty 1

## 2023-09-30 MED ORDER — CHLORHEXIDINE GLUCONATE 0.12 % MT SOLN
OROMUCOSAL | Status: AC
Start: 2023-09-30 — End: 2023-09-30
  Administered 2023-09-30: 15 mL
  Filled 2023-09-30: qty 15

## 2023-09-30 MED ORDER — ORAL CARE MOUTH RINSE: 15.0000 mL | Freq: Once | OROMUCOSAL | Status: DC

## 2023-09-30 MED ORDER — FENTANYL CITRATE (PF) 250 MCG/5ML IJ SOLN
INTRAMUSCULAR | Status: AC
  Filled 2023-09-30: qty 5

## 2023-09-30 MED ORDER — PRONTOSAN WOUND IRRIGATION OPTIME
TOPICAL | Status: DC | PRN
  Administered 2023-09-30: 1000 mL via TOPICAL

## 2023-09-30 MED ORDER — VANCOMYCIN HCL 1000 MG IV SOLR
INTRAVENOUS | Status: AC
  Filled 2023-09-30: qty 20

## 2023-09-30 SURGICAL SUPPLY — 56 items
BAG COUNTER SPONGE SURGICOUNT (BAG) ×1 IMPLANT
BAG DECANTER FOR FLEXI CONT (MISCELLANEOUS) ×1 IMPLANT
BALL HIP CERAMIC 32MM PLUS 9 IMPLANT
BLADE SAG 18X100X1.27 (BLADE) ×1 IMPLANT
COVER PERINEAL POST (MISCELLANEOUS) ×1 IMPLANT
COVER SURGICAL LIGHT HANDLE (MISCELLANEOUS) ×1 IMPLANT
CUP SECTOR GRIPTON 50MM (Cup) IMPLANT
DERMABOND ADVANCED .7 DNX12 (GAUZE/BANDAGES/DRESSINGS) IMPLANT
DRAPE C-ARM 42X72 X-RAY (DRAPES) ×1 IMPLANT
DRAPE POUCH INSTRU U-SHP 10X18 (DRAPES) ×1 IMPLANT
DRAPE STERI IOBAN 125X83 (DRAPES) ×1 IMPLANT
DRAPE U-SHAPE 47X51 STRL (DRAPES) ×2 IMPLANT
DRSG AQUACEL AG ADV 3.5X10 (GAUZE/BANDAGES/DRESSINGS) ×1 IMPLANT
DURAPREP 26ML APPLICATOR (WOUND CARE) ×2 IMPLANT
ELECT BLADE 4.0 EZ CLEAN MEGAD (MISCELLANEOUS) ×1 IMPLANT
ELECT REM PT RETURN 9FT ADLT (ELECTROSURGICAL) ×1 IMPLANT
ELECTRODE BLDE 4.0 EZ CLN MEGD (MISCELLANEOUS) ×1 IMPLANT
ELECTRODE REM PT RTRN 9FT ADLT (ELECTROSURGICAL) ×1 IMPLANT
GLOVE BIOGEL PI IND STRL 7.0 (GLOVE) ×2 IMPLANT
GLOVE BIOGEL PI IND STRL 7.5 (GLOVE) ×5 IMPLANT
GLOVE ECLIPSE 7.0 STRL STRAW (GLOVE) ×2 IMPLANT
GLOVE SKINSENSE STRL SZ7.5 (GLOVE) ×1 IMPLANT
GLOVE SURG SYN 7.5 E (GLOVE) ×2 IMPLANT
GLOVE SURG SYN 7.5 PF PI (GLOVE) ×2 IMPLANT
GLOVE SURG UNDER POLY LF SZ7 (GLOVE) ×3 IMPLANT
GLOVE SURG UNDER POLY LF SZ7.5 (GLOVE) ×2 IMPLANT
GOWN STRL REUS W/ TWL LRG LVL3 (GOWN DISPOSABLE) IMPLANT
GOWN STRL REUS W/ TWL XL LVL3 (GOWN DISPOSABLE) ×1 IMPLANT
GOWN STRL SURGICAL XL XLNG (GOWN DISPOSABLE) ×1 IMPLANT
GOWN TOGA ZIPPER T7+ PEEL AWAY (MISCELLANEOUS) ×1 IMPLANT
HIP BALL CERAMIC 32MM PLUS 9 ×1 IMPLANT
HOOD PEEL AWAY T7 (MISCELLANEOUS) ×1 IMPLANT
IV NS IRRIG 3000ML ARTHROMATIC (IV SOLUTION) ×1 IMPLANT
KIT BASIN OR (CUSTOM PROCEDURE TRAY) ×1 IMPLANT
LINER ACET PNNCL PLUS4 NEUTRAL (Hips) IMPLANT
MARKER SKIN DUAL TIP RULER LAB (MISCELLANEOUS) ×1 IMPLANT
NDL SPNL 18GX3.5 QUINCKE PK (NEEDLE) ×1 IMPLANT
NEEDLE SPNL 18GX3.5 QUINCKE PK (NEEDLE) ×1 IMPLANT
PACK TOTAL JOINT (CUSTOM PROCEDURE TRAY) ×1 IMPLANT
PACK UNIVERSAL I (CUSTOM PROCEDURE TRAY) ×1 IMPLANT
PINNACLE PLUS 4 NEUTRAL (Hips) ×1 IMPLANT
SCREW 6.5MMX25MM (Screw) IMPLANT
SET HNDPC FAN SPRY TIP SCT (DISPOSABLE) ×1 IMPLANT
SOLUTION PRONTOSAN WOUND 350ML (IRRIGATION / IRRIGATOR) ×1 IMPLANT
STEM FEM ACTIS HIGH SZ3 (Stem) IMPLANT
SUT ETHIBOND 2 V 37 (SUTURE) ×1 IMPLANT
SUT ETHILON 2 0 FS 18 (SUTURE) IMPLANT
SUT VIC AB 0 CT1 27XBRD ANBCTR (SUTURE) ×1 IMPLANT
SUT VIC AB 1 CTX36XBRD ANBCTR (SUTURE) ×1 IMPLANT
SUT VIC AB 2-0 CT1 TAPERPNT 27 (SUTURE) ×2 IMPLANT
SYR 50ML LL SCALE MARK (SYRINGE) ×1 IMPLANT
TOWEL GREEN STERILE (TOWEL DISPOSABLE) ×1 IMPLANT
TRAY CATH INTERMITTENT SS 16FR (CATHETERS) IMPLANT
TRAY FOLEY W/BAG SLVR 16FR ST (SET/KITS/TRAYS/PACK) IMPLANT
TUBE SUCT ARGYLE STRL (TUBING) ×1 IMPLANT
YANKAUER SUCT BULB TIP NO VENT (SUCTIONS) ×1 IMPLANT

## 2023-09-30 NOTE — Transfer of Care (Signed)
 Immediate Anesthesia Transfer of Care Note  Patient: Monica Watson  Procedure(s) Performed: LEFT TOTAL HIP ARTHROPLASTY ANTERIOR APPROACH (Left: Hip)  Patient Location: PACU  Anesthesia Type:MAC and Spinal  Level of Consciousness: drowsy  Airway & Oxygen Therapy: Patient Spontanous Breathing  Post-op Assessment: Report given to RN  Post vital signs: Reviewed and stable  Last Vitals:  Vitals Value Taken Time  BP 166/108 09/30/23 1230  Temp    Pulse 85 09/30/23 1233  Resp 14 09/30/23 1233  SpO2 98 % 09/30/23 1233  Vitals shown include unfiled device data.  Last Pain:  Vitals:   09/30/23 0806  TempSrc:   PainSc: 7       Patients Stated Pain Goal: 3 (09/30/23 0806)  Complications: No notable events documented.

## 2023-09-30 NOTE — Op Note (Signed)
 LEFT TOTAL HIP ARTHROPLASTY ANTERIOR APPROACH  Procedure Note Campbell Agramonte   [id number]  Pre-op Diagnosis: left hip osteoarthritis     Post-op Diagnosis: same  Operative Findings Complete loss of joint space and articular cartilage   Operative Procedures  1. Total hip replacement; Left hip; uncemented [id number]   Surgeon: Gershon Mussel, M.D.  Assist: Oneal Grout, PA-C   Anesthesia: spinal  Prosthesis: Depuy Acetabulum: Pinnacle 50 mm Femur: Actis 3 high offset Head: 32 mm size: +9 Liner: +4 Bearing Type: ceramic/poly  Total Hip Arthroplasty (Anterior Approach) Op Note:  After informed consent was obtained and the operative extremity marked in the holding area, the patient was brought back to the operating room and placed supine on the HANA table. Next, the operative extremity was prepped and draped in normal sterile fashion. Surgical timeout occurred verifying patient identification, surgical site, surgical procedure and administration of antibiotics.  A 10 cm longitudinal incision was made starting from 2 fingerbreadths lateral and inferior to the ASIS towards the lateral aspect of the patella.  A Hueter approach to the hip was performed, using the interval between tensor fascia lata and sartorius.  Dissection was carried bluntly down onto the anterior hip capsule. The lateral femoral circumflex vessels were identified and coagulated. A capsulotomy was performed and the capsular flaps tagged for later repair.  The neck osteotomy was performed. The femoral head was removed which showed severe wear, the acetabular rim was cleared of soft tissue and osteophytes and attention was turned to reaming the acetabulum.  Sequential reaming was performed under fluoroscopic guidance down to the floor of the cotyloid fossa. We reamed to a size 49 mm, and then impacted the acetabular shell. A 25 mm cancellous screw was placed to secure the shell.  The liner was then placed after irrigation  and attention turned to the femur.  After placing the femoral hook, the leg was taken to externally rotated, extended and adducted position taking care to perform soft tissue releases to allow for adequate mobilization of the femur. Soft tissue was cleared from the shoulder of the greater trochanter and the hook elevator used to improve exposure of the proximal femur. Sequential broaching performed up to a size 3.  Standard offset trial neck and +1.5 head were first placed. The leg was brought back up to neutral and the construct reduced.  The position and sizing of components, offset and leg lengths were checked using fluoroscopy. Stability of the construct was checked in 45 degrees of hip extension and 90 degrees of external rotation.  This construct did not restore native offset or leg length and was unstable.  Based on fluoroscopic findings, we decided the best combination would be high offset and +9 head ball.  We dislocated the prosthesis, dropped the leg back into position, removed trial components, and irrigated copiously. The final stem and head was then placed, the leg brought back up, the system reduced and fluoroscopy used to verify positioning.  Antibiotic irrigation was placed in the surgical wound.   We irrigated, obtained hemostasis and closed the capsule using #2 ethibond suture.  A topical mixture of 0.25% bupivacaine and meloxicam was placed deep to the fascia.  One gram of vancomycin powder was placed in the surgical bed.   One gram of topical tranexamic acid was injected into the joint.  The fascia was closed with #1 stratafix, the deep fat layer was closed with 0 vicryl, the subcutaneous layers closed with 2.0 Vicryl Plus and the skin closed  with 2.0 nylon and dermabond. A sterile dressing was applied. The patient was awakened in the operating room and taken to recovery in stable condition.  All sponge, needle, and instrument counts were correct at the end of the case.   Tessa Lerner,  my PA, was a medical necessity for opening, closing, limb positioning, retracting, exposing, and overall facilitation and timely completion of the surgery.  Position: supine  Complications: see description of procedure.  Time Out: performed   Drains/Packing: none  Estimated blood loss: see anesthesia record  Returned to Recovery Room: in good condition.   Antibiotics: yes   Mechanical VTE (DVT) Prophylaxis: sequential compression devices, TED thigh-high  Chemical VTE (DVT) Prophylaxis: aspirin   Fluid Replacement: see anesthesia record  Specimens Removed: 1 to pathology   Sponge and Instrument Count Correct? yes   PACU: portable radiograph - low AP   Plan/RTC: Return in 2 weeks for staple removal. Weight Bearing/Load Lower Extremity: full  Hip precautions: none Suture Removal: 2 weeks   N. Glee Arvin, MD [facility] 11:48 AM   Implant Name Type Inv. Item Serial No. Manufacturer Lot No. LRB No. Used Action  CUP SECTOR GRIPTON - [id number] Cup CUP SECTOR GRIPTON  [employer] [id number] Left 1 Implanted  PINNACLE PLUS 4 NEUTRAL - [id number] Hips PINNACLE PLUS 4 NEUTRAL  [employer]PU[employer] [id number] Left 1 Implanted  SCREW 6.5MMX25MM - [id number] Screw SCREW 6.5MMX25MM  [employer] [id number] Left 1 Implanted  HIP BALL CERAMIC PLUS 9 - [id number]  HIP BALL CERAMIC PLUS 9  [employer] [id number] Left 1 Implanted  STEM FEM ACTIS HIGH SZ3 - [id number] Stem STEM FEM ACTIS HIGH SZ3  [employer] [id number] Left 1 Implanted

## 2023-09-30 NOTE — Discharge Instructions (Signed)

## 2023-09-30 NOTE — Evaluation (Signed)
 Physical Therapy Evaluation Patient Details Name: Monica Watson MRN: [id number] DOB: 1955/09/27 Today's Date: 09/30/2023  History of Present Illness  68 y.o. female presents to [facility] hospital on 09/30/2023 for elective L THA. PMH includes bilateral TKR, L rTSA, DMII.  Clinical Impression  Pt presents to PT with deficits in strength, power, endurance, gait, balance. Pt is able to ambulate for short household distances, limited by reports of L hip pain at this time. Pt will benefit from frequent mobilization in an effort to improve activity tolerance and strength. PT will plan to follow up tomorrow for progression of gait and functional mobility along with education on therapeutic exercise.        If plan is discharge home, recommend the following: A little help with walking and/or transfers;A little help with bathing/dressing/bathroom;Assistance with cooking/housework;Assist for transportation;Help with stairs or ramp for entrance   Can travel by private vehicle        Equipment Recommendations BSC/3in1  Recommendations for Other Services       Functional Status Assessment Patient has had a recent decline in their functional status and demonstrates the ability to make significant improvements in function in a reasonable and predictable amount of time.     Precautions / Restrictions Precautions Precautions: Fall Recall of Precautions/Restrictions: Intact Precaution/Restrictions Comments: direct anterior THA Restrictions Weight Bearing Restrictions Per Provider Order: Yes LLE Weight Bearing Per Provider Order: Weight bearing as tolerated      Mobility  Bed Mobility Overal bed mobility: Needs Assistance Bed Mobility: Supine to Sit, Sit to Supine     Supine to sit: Supervision, HOB elevated Sit to supine: Contact guard assist, HOB elevated        Transfers Overall transfer level: Needs assistance Equipment used: Rolling walker (2 wheels) Transfers: Sit to/from Stand Sit to  Stand: Contact guard assist                Ambulation/Gait Ambulation/Gait assistance: Supervision Gait Distance (Feet): 15 Feet (15' x 2 trials to/from bathroom) Assistive device: Rolling walker (2 wheels) Gait Pattern/deviations: Step-to pattern Gait velocity: reduced Gait velocity interpretation: <1.31 ft/sec, indicative of household ambulator   General Gait Details: slowed step-to gait with increased trunk flexion  Stairs            Wheelchair Mobility     Tilt Bed    Modified Rankin (Stroke Patients Only)       Balance Overall balance assessment: Needs assistance Sitting-balance support: No upper extremity supported, Feet supported Sitting balance-Leahy Scale: Good     Standing balance support: Single extremity supported, Reliant on assistive device for balance Standing balance-Leahy Scale: Poor                               Pertinent Vitals/Pain Pain Assessment Pain Assessment: 0-10 Pain Score: 8  Pain Location: L hip Pain Descriptors / Indicators: Sore Pain Intervention(s): Monitored during session    Home Living Family/patient expects to be discharged to:: Private residence Living Arrangements: Children Available Help at Discharge: Family;Available PRN/intermittently Type of Home: Apartment Home Access: Level entry       Home Layout: One level Home Equipment: Agricultural consultant (2 wheels);Cane - single point      Prior Function Prior Level of Function : Independent/Modified Independent             Mobility Comments: ambulatory with PRN use of SPC       Extremity/Trunk Assessment   Upper Extremity  Assessment Upper Extremity Assessment: Overall WFL for tasks assessed    Lower Extremity Assessment Lower Extremity Assessment: LLE deficits/detail LLE Deficits / Details: generalized post-op weakness    Cervical / Trunk Assessment Cervical / Trunk Assessment: Normal  Communication   Communication Communication:  Impaired Factors Affecting Communication: Reduced clarity of speech    Cognition Arousal: Alert Behavior During Therapy: WFL for tasks assessed/performed   PT - Cognitive impairments: Problem solving                         Following commands: Intact       Cueing Cueing Techniques: Verbal cues     General Comments General comments (skin integrity, edema, etc.): VSS on RA    Exercises     Assessment/Plan    PT Assessment Patient needs continued PT services  PT Problem List Decreased strength;Decreased activity tolerance;Decreased balance;Decreased mobility;Decreased knowledge of use of DME;Pain       PT Treatment Interventions DME instruction;Gait training;Stair training;Functional mobility training;Therapeutic activities;Therapeutic exercise;Balance training;Neuromuscular re-education;Patient/family education    PT Goals (Current goals can be found in the Care Plan section)  Acute Rehab PT Goals Patient Stated Goal: to return to independence PT Goal Formulation: With patient Time For Goal Achievement: 10/04/23 Potential to Achieve Goals: Good    Frequency 7X/week     Co-evaluation               AM-PAC PT "6 Clicks" Mobility  Outcome Measure Help needed turning from your back to your side while in a flat bed without using bedrails?: A Little Help needed moving from lying on your back to sitting on the side of a flat bed without using bedrails?: A Little Help needed moving to and from a bed to a chair (including a wheelchair)?: A Little Help needed standing up from a chair using your arms (e.g., wheelchair or bedside chair)?: A Little Help needed to walk in hospital room?: A Little Help needed climbing 3-5 steps with a railing? : Total 6 Click Score: 16    End of Session Equipment Utilized During Treatment: Gait belt Activity Tolerance: Patient tolerated treatment well Patient left: in bed;with call bell/phone within reach Nurse Communication:  Mobility status PT Visit Diagnosis: Other abnormalities of gait and mobility (R26.89);Muscle weakness (generalized) (M62.81)    Time: 6387-5643 PT Time Calculation (min) (ACUTE ONLY): 26 min   Charges:   PT Evaluation $PT Eval Low Complexity: 1 Low   PT General Charges $$ ACUTE PT VISIT: 1 Visit         Arlyss Gandy, PT, DPT Acute Rehabilitation Office [phone]   Arlyss Gandy 09/30/2023, 6:05 PM

## 2023-09-30 NOTE — H&P (Signed)
 PREOPERATIVE H&P  Chief Complaint: left hip osteoarthritis  HPI: Monica Watson is a 68 y.o. female who presents for surgical treatment of left hip osteoarthritis.  She denies any changes in medical history.  Past Surgical History:  Procedure Laterality Date   bilateral knee replacements      CATARACT EXTRACTION, BILATERAL     FRACTURE SURGERY Right    arm as a child   gallstones removed     I & D KNEE WITH POLY EXCHANGE Right 12/16/2020   Procedure: POLY EXCHANGE RIGHT KNEE;  Surgeon: Kathryne Hitch, MD;  Location: [facility];  Service: Orthopedics;  Laterality: Right;   KNEE ARTHROSCOPY Bilateral    left carpal tunnel release      PAROTIDECTOMY Left 02/20/2023   Procedure: SUPERFICIAL PAROTIDECTOMY WITH FACIAL NERVE MONITORING;  Surgeon: Scarlette Ar, MD;  Location: [facility] OR;  Service: ENT;  Laterality: Left;   REVERSE SHOULDER ARTHROPLASTY Left 04/23/2022   Procedure: LEFT REVERSE SHOULDER ARTHROPLASTY;  Surgeon: Huel Cote, MD;  Location: [facility] OR;  Service: Orthopedics;  Laterality: Left;   SHOULDER ARTHROSCOPY Bilateral    THYROIDECTOMY     Social History   Socioeconomic History   Marital status: Single    Spouse name: Not on file   Number of children: 5   Years of education: Not on file   Highest education level: Not on file  Occupational History   Occupation: disability  Tobacco Use   Smoking status: Every Day    Current packs/day: 0.50    Average packs/day: 0.5 packs/day for 46.0 years (23.0 ttl pk-yrs)    Types: Cigarettes    Passive exposure: Current   Smokeless tobacco: Never  Vaping Use   Vaping status: Never Used  Substance and Sexual Activity   Alcohol use: Never   Drug use: Never   Sexual activity: Yes  Other Topics Concern   Not on file  Social History Narrative   Retired    Right handed   Social Drivers of Health   Financial Resource Strain: Low Risk  (05/06/2023)   Overall Financial Resource Strain (CARDIA)    Difficulty of  Paying Living Expenses: Not hard at all  Food Insecurity: No Food Insecurity (09/13/2023)   Hunger Vital Sign    Worried About Running Out of Food in the Last Year: Never true    Ran Out of Food in the Last Year: Never true  Transportation Needs: No Transportation Needs (08/26/2023)   PRAPARE - Administrator, Civil Service (Medical): No    Lack of Transportation (Non-Medical): No  Physical Activity: Inactive (05/06/2023)   Exercise Vital Sign    Days of Exercise per Week: 0 days    Minutes of Exercise per Session: 0 min  Stress: No Stress Concern Present (05/06/2023)   [employer] of Occupational Health - Occupational Stress Questionnaire    Feeling of Stress : Only a little  Social Connections: Socially Isolated (05/06/2023)   Social Connection and Isolation Panel [NHANES]    Frequency of Communication with Friends and Family: More than three times a week    Frequency of Social Gatherings with Friends and Family: Three times a week    Attends Religious Services: Never    Active Member of Clubs or Organizations: No    Attends Banker Meetings: Never    Marital Status: Never married   Family History  Problem Relation Age of Onset   Colon cancer Neg Hx    Esophageal cancer Neg  Hx    Stomach cancer Neg Hx    No Known Allergies Prior to Admission medications   Medication Sig Start Date End Date Taking? Authorizing Provider  aspirin (ASPIRIN 81) 81 MG chewable tablet Chew 1 tablet (81 mg total) by mouth 2 (two) times daily. To be taken after surgery to prevent blood clots 09/17/23  Yes Ivin Poot  aspirin EC 81 MG tablet Take 1 tablet (81 mg total) by mouth daily. Swallow whole. 01/09/23  Yes Elberta Fortis, MD  atorvastatin (LIPITOR) 40 MG tablet Take 40 mg by mouth daily. 04/22/23  Yes [provider]  Azelastine-Fluticasone 137-50 MCG/ACT SUSP Place 1 spray into the nose 2 (two) times daily. Patient taking differently: Place 1 spray  into the nose 2 (two) times daily as needed (allergies). 08/27/23  Yes Elberta Fortis, MD  ciclopirox [facility]) 8 % solution Apply topically at bedtime. Apply over nail and surrounding skin. Apply daily over previous coat. After seven (7) days, may remove with alcohol and continue cycle. 09/25/23  Yes Candelaria Stagers, DPM  clonazePAM (KLONOPIN) 0.25 MG disintegrating tablet Take 0.25 mg by mouth 2 (two) times daily as needed (restless legs). 04/08/22  Yes [provider]  docusate sodium (COLACE) 100 MG capsule Take 1 capsule (100 mg total) by mouth daily as needed. 09/17/23 09/16/24 Yes Cristie Hem, PA-C  doxycycline (VIBRAMYCIN) 100 MG capsule Take 1 capsule (100 mg total) by mouth 2 (two) times daily. To be taken after surgery 09/17/23  Yes Cristie Hem, PA-C  gabapentin (NEURONTIN) 800 MG tablet Take 1 tablet (800 mg total) by mouth at bedtime. 12/17/20  Yes Kathryne Hitch, MD  losartan (COZAAR) 50 MG tablet Take 1 tablet (50 mg total) by mouth daily. 01/09/23  Yes Elberta Fortis, MD  metFORMIN (GLUCOPHAGE-XR) 500 MG 24 hr tablet Take one tablet daily for a week. If you do well with that, increase to one tablet in the morning and one in the evening. Patient taking differently: Take 500 mg by mouth daily as needed (high blood sugar). 03/20/22  Yes Sabino Dick, DO  methocarbamol (ROBAXIN-750) 750 MG tablet Take 1 tablet (750 mg total) by mouth 3 (three) times daily as needed for muscle spasms. 09/17/23  Yes Cristie Hem, PA-C  montelukast (SINGULAIR) 10 MG tablet Take 1 tablet (10 mg total) by mouth at bedtime. 09/02/23  Yes Elberta Fortis, MD  omeprazole (PRILOSEC) 20 MG capsule TAKE ONE CAPSULE BY MOUTH ONCE DAILY FOR ACID REFLUX Patient taking differently: Take 20 mg by mouth daily as needed (acid reflux). 04/08/23  Yes Imogene Burn, MD  ondansetron (ZOFRAN) 4 MG tablet Take 1 tablet (4 mg total) by mouth every 8 (eight) hours as needed for nausea or vomiting.  09/17/23   Cristie Hem, PA-C  oxyCODONE-acetaminophen (PERCOCET) 10-325 MG tablet Take 1 tablet by mouth every 6 (six) hours as needed for pain. 03/18/23  Yes [provider]  oxyCODONE-acetaminophen (PERCOCET) 5-325 MG tablet Take 1-2 tablets by mouth every 6 (six) hours as needed. To be taken after surgery 09/17/23   Cristie Hem, PA-C  polyethylene glycol (MIRALAX) 17 g packet Take 17 g by mouth daily. Patient taking differently: Take 17 g by mouth daily as needed for moderate constipation. 10/21/20  Yes Cresenzo, Cyndi Lennert, MD  psyllium (METAMUCIL) 28 % packet Take 1 packet by mouth 2 (two) times daily. Patient taking differently: Take 1 packet by mouth daily as needed (constipation). 01/11/22  Yes Wells,  Apolonio Schneiders, MD  tiZANidine (ZANAFLEX) 4 MG tablet Take 1 tablet (4 mg total) by mouth every 8 (eight) hours as needed for muscle spasms. 12/17/20  Yes Kathryne Hitch, MD  trolamine salicylate (ASPERCREME) 10 % cream Apply 1 Application topically as needed for muscle pain.   Yes [provider]  calcium-vitamin D (OSCAL WITH D) 500-5 MG-MCG tablet Take 1 tablet by mouth daily with breakfast. Patient not taking: Reported on 09/16/2023 07/12/22   Sabino Dick, DO  ferrous sulfate (FEROSUL) 325 (65 FE) MG tablet Take 1 tablet (325 mg total) by mouth daily. Patient not taking: No sig reported 06/09/20 12/16/20  Sabino Dick, DO     Positive ROS: All other systems have been reviewed and were otherwise negative with the exception of those mentioned in the HPI and as above.  Physical Exam: General: Alert, no acute distress Cardiovascular: No pedal edema Respiratory: No cyanosis, no use of accessory musculature GI: abdomen soft Skin: No lesions in the area of chief complaint Neurologic: Sensation intact distally Psychiatric: Patient is competent for consent with normal mood and affect Lymphatic: no lymphedema  MUSCULOSKELETAL: exam  stable  Assessment: left hip osteoarthritis  Plan: Plan for Procedure(s): LEFT TOTAL HIP ARTHROPLASTY ANTERIOR APPROACH  The risks benefits and alternatives were discussed with the patient including but not limited to the risks of nonoperative treatment, versus surgical intervention including infection, bleeding, nerve injury,  blood clots, cardiopulmonary complications, morbidity, mortality, among others, and they were willing to proceed.   Glee Arvin, MD 09/30/2023 8:42 AM

## 2023-10-01 ENCOUNTER — Encounter (HOSPITAL_COMMUNITY): Payer: Self-pay | Admitting: Orthopaedic Surgery

## 2023-10-01 DIAGNOSIS — F17210 Nicotine dependence, cigarettes, uncomplicated: Secondary | ICD-10-CM | POA: Diagnosis not present

## 2023-10-01 DIAGNOSIS — Z96642 Presence of left artificial hip joint: Secondary | ICD-10-CM | POA: Diagnosis not present

## 2023-10-01 DIAGNOSIS — M1612 Unilateral primary osteoarthritis, left hip: Secondary | ICD-10-CM | POA: Diagnosis not present

## 2023-10-01 DIAGNOSIS — Z79899 Other long term (current) drug therapy: Secondary | ICD-10-CM | POA: Diagnosis not present

## 2023-10-01 LAB — GLUCOSE, CAPILLARY
Glucose-Capillary: 169 mg/dL — ABNORMAL HIGH (ref 70–99)
Glucose-Capillary: 193 mg/dL — ABNORMAL HIGH (ref 70–99)

## 2023-10-01 MED ORDER — CHLORHEXIDINE GLUCONATE CLOTH 2 % EX PADS: 6.0000 | MEDICATED_PAD | Freq: Every day | CUTANEOUS | Status: DC

## 2023-10-01 MED ORDER — MUPIROCIN 2 % EX OINT
1.0000 | TOPICAL_OINTMENT | Freq: Two times a day (BID) | CUTANEOUS | Status: DC
  Administered 2023-10-01: 1 via NASAL
  Filled 2023-10-01: qty 22

## 2023-10-01 NOTE — Discharge Summary (Signed)
 Patient ID: Monica Watson MRN: [id number] DOB/AGE: 1955-08-02 68 y.o.  Admit date: 09/30/2023 Discharge date: 10/01/2023  Admission Diagnoses:  Principal Problem:   Primary osteoarthritis of left hip Active Problems:   Status post total replacement of left hip   Discharge Diagnoses:  Same  Past Medical History:  Diagnosis Date   Arthritis    Cerebrovascular disease    Depression    GERD (gastroesophageal reflux disease)    Headache    Migraines   History of pneumonia    Hypertension    Stroke (HCC) 2010   Left sided weakness   Type 2 diabetes mellitus (HCC)     Surgeries: Procedure(s): LEFT TOTAL HIP ARTHROPLASTY ANTERIOR APPROACH on 09/30/2023   Consultants:   Discharged Condition: Improved  Hospital Course: Monica Watson is an 68 y.o. female who was admitted 09/30/2023 for operative treatment ofPrimary osteoarthritis of left hip. Patient has severe unremitting pain that affects sleep, daily activities, and work/hobbies. After pre-op clearance the patient was taken to the operating room on 09/30/2023 and underwent  Procedure(s): LEFT TOTAL HIP ARTHROPLASTY ANTERIOR APPROACH.    Patient was given perioperative antibiotics:  Anti-infectives (From admission, onward)    Start     Dose/Rate Route Frequency Ordered Stop   10/01/23 1000  doxycycline (VIBRA-TABS) tablet 100 mg       Note to Pharmacy: To be taken after surgery     100 mg Oral 2 times daily 09/30/23 1336     09/30/23 1345  ceFAZolin (ANCEF) IVPB 2g/100 mL premix        2 g 200 mL/hr over 30 Minutes Intravenous Every 6 hours 09/30/23 1336 09/30/23 2051   09/30/23 1102  vancomycin (VANCOCIN) powder  Status:  Discontinued          As needed 09/30/23 1103 09/30/23 1220   09/30/23 0815  ceFAZolin (ANCEF) IVPB 2g/100 mL premix        2 g 200 mL/hr over 30 Minutes Intravenous On call to O.R. 09/30/23 4098 09/30/23 1028   09/30/23 0751  ceFAZolin (ANCEF) 2-4 GM/100ML-% IVPB       Note to Pharmacy: Gleason, Ginger E:  cabinet override      09/30/23 0751 09/30/23 1045        Patient was given sequential compression devices, early ambulation, and chemoprophylaxis to prevent DVT.  Patient benefited maximally from hospital stay and there were no complications.    Recent vital signs: Patient Vitals for the past 24 hrs:  BP Temp Temp src Pulse Resp SpO2  10/01/23 0327 138/79 98.9 F (37.2 C) Oral 78 18 100 %  09/30/23 2241 129/81 98.7 F (37.1 C) Oral 70 18 98 %  09/30/23 1929 (!) 139/102 99.1 F (37.3 C) Oral 77 18 100 %  09/30/23 1543 (!) 155/94 98.7 F (37.1 C) -- 88 20 100 %  09/30/23 1350 (!) 150/89 -- -- 65 18 100 %  09/30/23 1315 -- 97.8 F (36.6 C) -- -- -- --  09/30/23 1230 (!) 166/108 -- -- 79 14 97 %  09/30/23 1225 (!) 136/101 97.7 F (36.5 C) -- 85 15 99 %     Recent laboratory studies: No results for input(s): "WBC", "HGB", "HCT", "PLT", "NA", "K", "CL", "CO2", "BUN", "CREATININE", "GLUCOSE", "INR", "CALCIUM" in the last 72 hours.  Invalid input(s): "PT", "2"   Discharge Medications:   Allergies as of 10/01/2023   No Known Allergies      Medication List     STOP taking these medications  tiZANidine 4 MG tablet Commonly known as: Zanaflex       TAKE these medications    aspirin 81 MG chewable tablet Commonly known as: Aspirin 81 Chew 1 tablet (81 mg total) by mouth 2 (two) times daily. To be taken after surgery to prevent blood clots What changed: Another medication with the same name was removed. Continue taking this medication, and follow the directions you see here.   atorvastatin 40 MG tablet Commonly known as: LIPITOR Take 40 mg by mouth daily.   Azelastine-Fluticasone 137-50 MCG/ACT Susp Place 1 spray into the nose 2 (two) times daily. What changed:  when to take this reasons to take this   calcium-vitamin D 500-5 MG-MCG tablet Commonly known as: OSCAL WITH D Take 1 tablet by mouth daily with breakfast.   chlorhexidine 4 % external liquid Commonly  known as: HIBICLENS Apply 15 mLs (1 Application total) topically as directed for 30 doses. Use as directed daily for 5 days every other week for 6 weeks.   ciclopirox 8 % solution Commonly known as: PENLAC Apply topically at bedtime. Apply over nail and surrounding skin. Apply daily over previous coat. After seven (7) days, may remove with alcohol and continue cycle.   clonazePAM 0.25 MG disintegrating tablet Commonly known as: KLONOPIN Take 0.25 mg by mouth 2 (two) times daily as needed (restless legs).   docusate sodium 100 MG capsule Commonly known as: Colace Take 1 capsule (100 mg total) by mouth daily as needed.   doxycycline 100 MG capsule Commonly known as: Vibramycin Take 1 capsule (100 mg total) by mouth 2 (two) times daily. To be taken after surgery   gabapentin 800 MG tablet Commonly known as: NEURONTIN Take 1 tablet (800 mg total) by mouth at bedtime.   losartan 50 MG tablet Commonly known as: COZAAR Take 1 tablet (50 mg total) by mouth daily.   Metamucil 28 % packet Generic drug: psyllium Take 1 packet by mouth 2 (two) times daily. What changed:  when to take this reasons to take this   metFORMIN 500 MG 24 hr tablet Commonly known as: GLUCOPHAGE-XR Take one tablet daily for a week. If you do well with that, increase to one tablet in the morning and one in the evening. What changed:  how much to take how to take this when to take this reasons to take this additional instructions   methocarbamol 750 MG tablet Commonly known as: Robaxin-750 Take 1 tablet (750 mg total) by mouth 3 (three) times daily as needed for muscle spasms.   montelukast 10 MG tablet Commonly known as: SINGULAIR Take 1 tablet (10 mg total) by mouth at bedtime.   mupirocin ointment 2 % Commonly known as: BACTROBAN Place 1 Application into the nose 2 (two) times daily for 60 doses. Use as directed 2 times daily for 5 days every other week for 6 weeks.   omeprazole 20 MG  capsule Commonly known as: PRILOSEC TAKE ONE CAPSULE BY MOUTH ONCE DAILY FOR ACID REFLUX What changed: See the new instructions.   ondansetron 4 MG tablet Commonly known as: Zofran Take 1 tablet (4 mg total) by mouth every 8 (eight) hours as needed for nausea or vomiting.   oxyCODONE-acetaminophen 5-325 MG tablet Commonly known as: Percocet Take 1-2 tablets by mouth every 6 (six) hours as needed. To be taken after surgery What changed: Another medication with the same name was removed. Continue taking this medication, and follow the directions you see here.   polyethylene glycol 17  g packet Commonly known as: MiraLax Take 17 g by mouth daily. What changed:  when to take this reasons to take this   trolamine salicylate 10 % cream Commonly known as: ASPERCREME Apply 1 Application topically as needed for muscle pain.               Durable Medical Equipment  (From admission, onward)           Start     Ordered   09/30/23 1337  DME Walker rolling  Once       Question:  Patient needs a walker to treat with the following condition  Answer:  History of hip replacement   09/30/23 1336   09/30/23 1337  DME 3 n 1  Once        09/30/23 1336   09/30/23 1337  DME Bedside commode  Once       Question:  Patient needs a bedside commode to treat with the following condition  Answer:  History of hip replacement   09/30/23 1336            Diagnostic Studies: DG Pelvis Portable Result Date: 09/30/2023 CLINICAL DATA:  Postoperative imaging of left hip replacement. EXAM: PORTABLE PELVIS 1-2 VIEWS COMPARISON:  CT abdomen and pelvis 07/19/2023 FINDINGS: Interval total left hip arthroplasty. No perihardware lucency is seen to indicate hardware failure or loosening. Expected postoperative changes of lateral left hip and thigh subcutaneous air. Mild to moderate right femoroacetabular joint space narrowing with moderate peripheral osteophytes. Mild pubic symphysis joint space narrowing and  superior osteophytosis. A surgical clip overlies the left ilium, seen to be within the anterior left mesenteric fat on prior CT. IMPRESSION: Interval total left hip arthroplasty without evidence of hardware failure. Electronically Signed   By: Neita Garnet M.D.   On: 09/30/2023 14:48   DG HIP UNILAT WITH PELVIS 1V LEFT Result Date: 09/30/2023 CLINICAL DATA:  Total left hip arthroplasty.  Elective surgery. EXAM: DG HIP (WITH OR WITHOUT PELVIS) 1V*L* COMPARISON:  Pelvis and left hip radiographs 06/11/2023 FINDINGS: Images were performed intraoperatively without the presence of a radiologist. Moderate to severe superior left femoroacetabular joint space narrowing and superolateral acetabular degenerative osteophytosis. The patient is undergoing total left hip arthroplasty. No hardware complication is seen. Total fluoroscopy images: 4 Total fluoroscopy time: 31 seconds Total dose: Radiation Exposure Index (as provided by the fluoroscopic device): 4.1 mGy air Kerma Please see intraoperative findings for further detail. IMPRESSION: Intraoperative fluoroscopy for total left hip arthroplasty. Electronically Signed   By: Neita Garnet M.D.   On: 09/30/2023 14:45   DG C-Arm 1-60 Min-No Report Result Date: 09/30/2023 Fluoroscopy was utilized by the requesting physician.  No radiographic interpretation.   DG C-Arm 1-60 Min-No Report Result Date: 09/30/2023 Fluoroscopy was utilized by the requesting physician.  No radiographic interpretation.    Disposition: Discharge disposition: 01-Home or Self Care          Follow-up Information     Cristie Hem, PA-C. Schedule an appointment as soon as possible for a visit in 2 week(s).   Specialty: Orthopedic Surgery Contact information: [address] [place] [address] [phone]         [facility] Follow up.   Specialty: Home Health Services Why[facility] will contact you for the first home visit Contact information: [address] [place] [address] [phone]  Signed: Cristie Hem 10/01/2023, 7:50 AM

## 2023-10-01 NOTE — Progress Notes (Signed)
 RN went over discharge instructions with the patient at the bedside. RN education patient on medication changes and follow up appointments. Patient verbalize understanding. Transportation has been called. Patient is getting dressed.

## 2023-10-01 NOTE — Progress Notes (Signed)
 Physical Therapy Treatment  Patient Details Name: Monica Watson MRN: [id number] DOB: [birth year] Today's Date: 10/01/2023   History of Present Illness Pt is a 68 y/o female presents to [facility] hospital on 09/30/2023 for elective L THA. PMH includes bilateral TKR, L rTSA, DMII.    PT Comments  Pt progressing towards physical therapy goals. Was able to perform transfers and ambulation with gross CGA and RW for support. Reviewed HEP, stair training, and gait training. Will see for second session to progress gait and practice stairs again prior to d/c. Will continue to follow.     If plan is discharge home, recommend the following: A little help with walking and/or transfers;A little help with bathing/dressing/bathroom;Assistance with cooking/housework;Assist for transportation;Help with stairs or ramp for entrance   Can travel by private vehicle        Equipment Recommendations  BSC/3in1    Recommendations for Other Services       Precautions / Restrictions Precautions Precautions: Fall Recall of Precautions/Restrictions: Intact Precaution/Restrictions Comments: direct anterior THA Restrictions Weight Bearing Restrictions Per Provider Order: Yes LLE Weight Bearing Per Provider Order: Weight bearing as tolerated     Mobility  Bed Mobility Overal bed mobility: Needs Assistance Bed Mobility: Supine to Sit, Sit to Supine     Supine to sit: Supervision, HOB elevated     General bed mobility comments: Increased time and effort but no assist required    Transfers Overall transfer level: Needs assistance Equipment used: Rolling walker (2 wheels) Transfers: Sit to/from Stand Sit to Stand: Supervision           General transfer comment: VC's for hand placement on seated surface for safety.    Ambulation/Gait Ambulation/Gait assistance: Supervision Gait Distance (Feet): 75 Feet Assistive device: Rolling walker (2 wheels) Gait Pattern/deviations: Step-through pattern, Decreased  stride length, Trunk flexed Gait velocity: Decreased Gait velocity interpretation: <1.31 ft/sec, indicative of household ambulator   General Gait Details: Very slow and guarded. No assist required but pt reports she has been moving slow since her stroke   Stairs Stairs: Yes Stairs assistance: Contact guard assist, Min assist Stair Management: One rail Right, Step to pattern, Forwards Number of Stairs: 3 General stair comments: VC's for sequencing and general safety.   Wheelchair Mobility     Tilt Bed    Modified Rankin (Stroke Patients Only)       Balance Overall balance assessment: Needs assistance Sitting-balance support: No upper extremity supported, Feet supported Sitting balance-Leahy Scale: Good     Standing balance support: Single extremity supported, Reliant on assistive device for balance Standing balance-Leahy Scale: Poor                              Communication Communication Communication: Impaired Factors Affecting Communication: Reduced clarity of speech  Cognition Arousal: Alert Behavior During Therapy: WFL for tasks assessed/performed   PT - Cognitive impairments: Problem solving, Memory, Safety/Judgement                       PT - Cognition Comments: Likely baseline due to prior stroke Following commands: Intact      Cueing Cueing Techniques: Verbal cues  Exercises General Exercises - Lower Extremity Ankle Circles/Pumps: 10 reps Quad Sets: 10 reps Short Arc Quad: 10 reps Long Arc Quad: 10 reps Heel Slides: 10 reps Hip ABduction/ADduction: 10 reps    General Comments        Pertinent Vitals/Pain Pain Assessment  Pain Assessment: Faces Faces Pain Scale: Hurts even more Pain Location: L hip Pain Descriptors / Indicators: Sore Pain Intervention(s): Limited activity within patient's tolerance, Monitored during session, Repositioned    Home Living                          Prior Function             PT Goals (current goals can now be found in the care plan section) Acute Rehab PT Goals Patient Stated Goal: to return to independence PT Goal Formulation: With patient Time For Goal Achievement: 10/04/23 Potential to Achieve Goals: Good Progress towards PT goals: Progressing toward goals    Frequency    7X/week      PT Plan      Co-evaluation              AM-PAC PT "6 Clicks" Mobility   Outcome Measure  Help needed turning from your back to your side while in a flat bed without using bedrails?: A Little Help needed moving from lying on your back to sitting on the side of a flat bed without using bedrails?: A Little Help needed moving to and from a bed to a chair (including a wheelchair)?: A Little Help needed standing up from a chair using your arms (e.g., wheelchair or bedside chair)?: A Little Help needed to walk in hospital room?: A Little Help needed climbing 3-5 steps with a railing? : A Little 6 Click Score: 18    End of Session Equipment Utilized During Treatment: Gait belt Activity Tolerance: Patient tolerated treatment well Patient left: in bed;with call bell/phone within reach Nurse Communication: Mobility status PT Visit Diagnosis: Other abnormalities of gait and mobility (R26.89);Muscle weakness (generalized) (M62.81)     Time: 4098-1191 PT Time Calculation (min) (ACUTE ONLY): 39 min  Charges:    $Gait Training: 23-37 mins $Therapeutic Exercise: 8-22 mins PT General Charges $$ ACUTE PT VISIT: 1 Visit                     Conni Slipper, PT, DPT Acute Rehabilitation Services Secure Chat Preferred Office: [phone]    Marylynn Pearson 10/01/2023, 10:48 AM

## 2023-10-01 NOTE — Progress Notes (Signed)
 RN attempted to call daughter Anne Hahn to inform that patient will be discharging per patient request. RN unable to speak to daughter and unable to leave voicemail. Patient notified. RN to reattempts if applicable.

## 2023-10-01 NOTE — Care Management Obs Status (Signed)
 MEDICARE OBSERVATION STATUS NOTIFICATION   Patient Details  Name: Monica Watson MRN: [id number] Date of Birth: 06-16-1956   Medicare Observation Status Notification Given:  Yes  [place]oon/OBS signed and given  Dorena Bodo 10/01/2023, 10:31 AM

## 2023-10-01 NOTE — Progress Notes (Signed)
 Subjective: 1 Day Post-Op Procedure(s) (LRB): LEFT TOTAL HIP ARTHROPLASTY ANTERIOR APPROACH (Left) Patient reports pain as mild.    Objective: Vital signs in last 24 hours: Temp:  [97.7 F (36.5 C)-99.1 F (37.3 C)] 98.9 F (37.2 C) (04/08 0327) Pulse Rate:  [65-88] 78 (04/08 0327) Resp:  [14-20] 18 (04/08 0327) BP: (129-166)/(79-108) 138/79 (04/08 0327) SpO2:  [97 %-100 %] 100 % (04/08 0327)  Intake/Output from previous day: 04/07 0701 - 04/08 0700 In: 1790.9 [P.O.:720; I.V.:770.9; IV Piggyback:300] Out: 200 [Urine:100; Blood:100] Intake/Output this shift: No intake/output data recorded.  No results for input(s): "HGB" in the last 72 hours. No results for input(s): "WBC", "RBC", "HCT", "PLT" in the last 72 hours. No results for input(s): "NA", "K", "CL", "CO2", "BUN", "CREATININE", "GLUCOSE", "CALCIUM" in the last 72 hours. No results for input(s): "LABPT", "INR" in the last 72 hours.  Neurologically intact Neurovascular intact Sensation intact distally Intact pulses distally Dorsiflexion/Plantar flexion intact Incision: dressing C/D/I No cellulitis present Compartment soft   Assessment/Plan: 1 Day Post-Op Procedure(s) (LRB): LEFT TOTAL HIP ARTHROPLASTY ANTERIOR APPROACH (Left) Advance diet Up with therapy D/C IV fluids Discharge home with home health once cleared by PT WBAT LLE      Cristie Hem 10/01/2023, 7:49 AM

## 2023-10-01 NOTE — Progress Notes (Signed)
 Physical Therapy Treatment  Patient Details Name: Monica Watson MRN: [id number] DOB: [birth year] Today's Date: 10/01/2023   History of Present Illness Pt is a 68 y/o female presents [facility] hospital on [facility]/30/2023 for elective L THA. PMH includes bilateral TKR, L rTSA, DMII.    PT Comments  Pt progressing towards physical therapy goals. Was able to perform transfers and ambulation with slightly improved speed. Practiced stair training, car transfer, and verbally reviewed HEP. Pt reports she feels ready for d/c. Pt anticipates d/c home this afternoon. Will continue to follow and progress as able per POC.    If plan is discharge home, recommend the following: A little help with walking and/or transfers;A little help with bathing/dressing/bathroom;Assistance with cooking/housework;Assist for transportation;Help with stairs or ramp for entrance   Can travel by private vehicle        Equipment Recommendations  BSC/3in1    Recommendations for Other Services       Precautions / Restrictions Precautions Precautions: Fall Recall of Precautions/Restrictions: Intact Precaution/Restrictions Comments: direct anterior THA Restrictions Weight Bearing Restrictions Per Provider Order: Yes LLE Weight Bearing Per Provider Order: Weight bearing as tolerated     Mobility  Bed Mobility Overal bed mobility: Needs Assistance Bed Mobility: Supine to Sit, Sit to Supine     Supine to sit: Supervision, HOB elevated     General bed mobility comments: Pt was received sitting up in recliner    Transfers Overall transfer level: Needs assistance Equipment used: Rolling walker (2 wheels) Transfers: Sit to/from Stand Sit to Stand: Supervision           General transfer comment: VC's for hand placement on seated surface for safety.    Ambulation/Gait Ambulation/Gait assistance: Supervision Gait Distance (Feet): 75 Feet Assistive device: Rolling walker (2 wheels) Gait Pattern/deviations:  Step-through pattern, Decreased stride length, Trunk flexed Gait velocity: Decreased Gait velocity interpretation: 1.31 - 2.62 ft/sec, indicative of limited community ambulator   General Gait Details: Very slow and guarded. No assist required but pt reports she has been moving slow since her stroke   Stairs Stairs: Yes Stairs assistance: Contact guard assist, Min assist Stair Management: One rail Right, Step to pattern, Forwards Number of Stairs: 3 General stair comments: VC's for sequencing and general safety.   Wheelchair Mobility     Tilt Bed    Modified Rankin (Stroke Patients Only)       Balance Overall balance assessment: Needs assistance Sitting-balance support: No upper extremity supported, Feet supported Sitting balance-Leahy Scale: Good     Standing balance support: Single extremity supported, Reliant on assistive device for balance Standing balance-Leahy Scale: Poor                              Communication Communication Communication: Impaired Factors Affecting Communication: Reduced clarity of speech  Cognition Arousal: Alert Behavior During Therapy: WFL for tasks assessed/performed   PT - Cognitive impairments: Problem solving, Memory, Safety/Judgement                       PT - Cognition Comments: Likely baseline due to prior stroke Following commands: Intact      Cueing Cueing Techniques: Verbal cues  Exercises General Exercises - Lower Extremity Ankle Circles/Pumps: 10 reps Quad Sets: 10 reps Short Arc Quad: 10 reps Long Arc Quad: 10 reps Heel Slides: 10 reps Hip ABduction/ADduction: 10 reps    General Comments  Pertinent Vitals/Pain Pain Assessment Pain Assessment: Faces Faces Pain Scale: Hurts even more Pain Location: L hip Pain Descriptors / Indicators: Sore Pain Intervention(s): Limited activity within patient's tolerance, Monitored during session, Repositioned    Home Living                           Prior Function            PT Goals (current goals can now be found in the care plan section) Acute Rehab PT Goals Patient Stated Goal: to return to independence PT Goal Formulation: With patient Time For Goal Achievement: 10/04/23 Potential to Achieve Goals: Good Progress towards PT goals: Progressing toward goals    Frequency    7X/week      PT Plan      Co-evaluation              AM-PAC PT "6 Clicks" Mobility   Outcome Measure  Help needed turning from your back to your side while in a flat bed without using bedrails?: A Little Help needed moving from lying on your back to sitting on the side of a flat bed without using bedrails?: A Little Help needed moving to and from a bed to a chair (including a wheelchair)?: A Little Help needed standing up from a chair using your arms (e.g., wheelchair or bedside chair)?: A Little Help needed to walk in hospital room?: A Little Help needed climbing 3-5 steps with a railing? : A Little 6 Click Score: 18    End of Session Equipment Utilized During Treatment: Gait belt Activity Tolerance: Patient tolerated treatment well Patient left: in bed;with call bell/phone within reach Nurse Communication: Mobility status PT Visit Diagnosis: Other abnormalities of gait and mobility (R26.89);Muscle weakness (generalized) (M62.81)     Time: 1220-1240 PT Time Calculation (min) (ACUTE ONLY): 20 min  Charges:    $Gait Training: 8-22 mins $Therapeutic Exercise: 8-22 mins PT General Charges $$ ACUTE PT VISIT: 1 Visit                     Conni Slipper, PT, DPT Acute Rehabilitation Services Secure Chat Preferred Office: [phone]    Monica Watson 10/01/2023, 1:31 PM

## 2023-10-02 ENCOUNTER — Other Ambulatory Visit: Payer: Self-pay

## 2023-10-02 ENCOUNTER — Telehealth: Payer: Self-pay | Admitting: Orthopaedic Surgery

## 2023-10-02 MED ORDER — BUPIVACAINE IN DEXTROSE 0.75-8.25 % IT SOLN
INTRATHECAL | Status: DC | PRN
  Administered 2023-09-30: 1.8 mL via INTRATHECAL

## 2023-10-02 NOTE — Anesthesia Procedure Notes (Addendum)
 Spinal  Patient location during procedure: OR Start time: 09/30/2023 10:20 AM End time: 09/30/2023 10:30 AM Reason for block: surgical anesthesia Staffing Performed: anesthesiologist  Anesthesiologist: Val Eagle, MD Performed by: Val Eagle, MD Authorized by: Val Eagle, MD   Preanesthetic Checklist Completed: patient identified, IV checked, risks and benefits discussed, surgical consent, monitors and equipment checked, pre-op evaluation and timeout performed Spinal Block Patient position: sitting Prep: DuraPrep Patient monitoring: heart rate, cardiac monitor, continuous pulse ox and blood pressure Approach: midline Location: L3-4 Injection technique: single-shot Needle Needle type: Pencan  Needle gauge: 24 G Needle length: 9 cm Assessment Sensory level: T6 Events: CSF return

## 2023-10-02 NOTE — Patient Outreach (Signed)
 Complex Care Management   Visit Note  10/02/2023  Name:  Monica Watson MRN: [id number] DOB: 1956/01/07  Situation: Referral received for Complex Care Management related to SDOH Barriers:  Housing BSW [place] mailed resources for affordable housing. Patient did receive the resources and has contacted some of them.  I obtained verbal consent from Patient.  Visit completed with Patient.  on the phone  Background:   Past Medical History:  Diagnosis Date   Arthritis    Cerebrovascular disease    Depression    GERD (gastroesophageal reflux disease)    Headache    Migraines   History of pneumonia    Hypertension    Stroke [facility]) 2010   Left sided weakness   Type 2 diabetes mellitus (HCC)     Assessment: Patient Reported Symptoms:  Cognitive    Neurological      HEENT      Cardiovascular      Respiratory      Endocrine      Gastrointestinal      Genitourinary      Integumentary      Musculoskeletal      Psychosocial       There were no vitals filed for this visit.  Medications Reviewed Today   Medications were not reviewed in this encounter     Recommendation:   Patient will continue to follow up with housing resources and landlord.   Follow Up Plan:   Telephone follow-up in 1 month  Gus Puma, [place], [place] [facility]  Managed [facility] Social Worker [phone]

## 2023-10-02 NOTE — Anesthesia Postprocedure Evaluation (Signed)
 Anesthesia Post Note  Patient: Monica Watson  Procedure(s) Performed: LEFT TOTAL HIP ARTHROPLASTY ANTERIOR APPROACH (Left: Hip)     Patient location during evaluation: PACU Anesthesia Type: Spinal and MAC Level of consciousness: awake and alert Pain management: pain level controlled Vital Signs Assessment: post-procedure vital signs reviewed and stable Respiratory status: spontaneous breathing, nonlabored ventilation and respiratory function stable Cardiovascular status: stable and blood pressure returned to baseline Postop Assessment: no apparent nausea or vomiting Anesthetic complications: no   No notable events documented.             [name]

## 2023-10-02 NOTE — Patient Instructions (Signed)
/  Visit Information  Thank you for taking time to visit with me today. Please don't hesitate to contact me if I can be of assistance to you before our next scheduled appointment.  Your next care management appointment is by telephone on 11/01/23 at 11:00  Telephone follow-up in 1 month  Please call the care guide team at [phone] if you need to cancel, schedule, or reschedule an appointment.   Please call the Suicide and Crisis Lifeline: 988 call the [place] National Suicide Prevention Lifeline: 787-219-5200 or TTY: 706-240-0011 TTY 7652650495) to talk to a trained counselor call 1-800-273-TALK (toll free, 24 hour hotline) go to [facility] [address], [place] [phone]) call the [facility] Crisis Line: [phone]) [phone] call 911 if you are experiencing a Mental Health or Behavioral Health Crisis or need someone to talk to.  Gus Puma, Kenard Gower, MHA [place]  [facility] Social Worker, Population Health [phone]

## 2023-10-02 NOTE — Telephone Encounter (Signed)
 Patient called and wanted to know can she get a toilet for her room. CB#[phone]

## 2023-10-03 NOTE — Telephone Encounter (Signed)
Order in parachute

## 2023-10-03 NOTE — Telephone Encounter (Signed)
Pt returned called and was advised.

## 2023-10-03 NOTE — Telephone Encounter (Signed)
 yes

## 2023-10-03 NOTE — Telephone Encounter (Signed)
Tried calling pt to advise but mail box was full

## 2023-10-04 DIAGNOSIS — Z9841 Cataract extraction status, right eye: Secondary | ICD-10-CM | POA: Diagnosis not present

## 2023-10-04 DIAGNOSIS — Z471 Aftercare following joint replacement surgery: Secondary | ICD-10-CM | POA: Diagnosis not present

## 2023-10-04 DIAGNOSIS — K5900 Constipation, unspecified: Secondary | ICD-10-CM | POA: Diagnosis not present

## 2023-10-04 DIAGNOSIS — I69354 Hemiplegia and hemiparesis following cerebral infarction affecting left non-dominant side: Secondary | ICD-10-CM | POA: Diagnosis not present

## 2023-10-04 DIAGNOSIS — Z96653 Presence of artificial knee joint, bilateral: Secondary | ICD-10-CM | POA: Diagnosis not present

## 2023-10-04 DIAGNOSIS — Z96642 Presence of left artificial hip joint: Secondary | ICD-10-CM | POA: Diagnosis not present

## 2023-10-04 DIAGNOSIS — K219 Gastro-esophageal reflux disease without esophagitis: Secondary | ICD-10-CM | POA: Diagnosis not present

## 2023-10-04 DIAGNOSIS — F17210 Nicotine dependence, cigarettes, uncomplicated: Secondary | ICD-10-CM | POA: Diagnosis not present

## 2023-10-04 DIAGNOSIS — Z9842 Cataract extraction status, left eye: Secondary | ICD-10-CM | POA: Diagnosis not present

## 2023-10-04 DIAGNOSIS — Z96612 Presence of left artificial shoulder joint: Secondary | ICD-10-CM | POA: Diagnosis not present

## 2023-10-04 DIAGNOSIS — Z86018 Personal history of other benign neoplasm: Secondary | ICD-10-CM | POA: Diagnosis not present

## 2023-10-04 DIAGNOSIS — I10 Essential (primary) hypertension: Secondary | ICD-10-CM | POA: Diagnosis not present

## 2023-10-04 DIAGNOSIS — M545 Low back pain, unspecified: Secondary | ICD-10-CM | POA: Diagnosis not present

## 2023-10-04 DIAGNOSIS — G43909 Migraine, unspecified, not intractable, without status migrainosus: Secondary | ICD-10-CM | POA: Diagnosis not present

## 2023-10-04 DIAGNOSIS — M1990 Unspecified osteoarthritis, unspecified site: Secondary | ICD-10-CM | POA: Diagnosis not present

## 2023-10-04 DIAGNOSIS — Z792 Long term (current) use of antibiotics: Secondary | ICD-10-CM | POA: Diagnosis not present

## 2023-10-04 DIAGNOSIS — E119 Type 2 diabetes mellitus without complications: Secondary | ICD-10-CM | POA: Diagnosis not present

## 2023-10-04 DIAGNOSIS — Z7984 Long term (current) use of oral hypoglycemic drugs: Secondary | ICD-10-CM | POA: Diagnosis not present

## 2023-10-04 DIAGNOSIS — Z7982 Long term (current) use of aspirin: Secondary | ICD-10-CM | POA: Diagnosis not present

## 2023-10-04 DIAGNOSIS — Z556 Problems related to health literacy: Secondary | ICD-10-CM | POA: Diagnosis not present

## 2023-10-04 DIAGNOSIS — Z9181 History of falling: Secondary | ICD-10-CM | POA: Diagnosis not present

## 2023-10-04 DIAGNOSIS — G8929 Other chronic pain: Secondary | ICD-10-CM | POA: Diagnosis not present

## 2023-10-07 DIAGNOSIS — Z9181 History of falling: Secondary | ICD-10-CM | POA: Diagnosis not present

## 2023-10-07 DIAGNOSIS — Z7982 Long term (current) use of aspirin: Secondary | ICD-10-CM | POA: Diagnosis not present

## 2023-10-07 DIAGNOSIS — M545 Low back pain, unspecified: Secondary | ICD-10-CM | POA: Diagnosis not present

## 2023-10-07 DIAGNOSIS — Z96612 Presence of left artificial shoulder joint: Secondary | ICD-10-CM | POA: Diagnosis not present

## 2023-10-07 DIAGNOSIS — I69354 Hemiplegia and hemiparesis following cerebral infarction affecting left non-dominant side: Secondary | ICD-10-CM | POA: Diagnosis not present

## 2023-10-07 DIAGNOSIS — K219 Gastro-esophageal reflux disease without esophagitis: Secondary | ICD-10-CM | POA: Diagnosis not present

## 2023-10-07 DIAGNOSIS — Z96642 Presence of left artificial hip joint: Secondary | ICD-10-CM | POA: Diagnosis not present

## 2023-10-07 DIAGNOSIS — Z96653 Presence of artificial knee joint, bilateral: Secondary | ICD-10-CM | POA: Diagnosis not present

## 2023-10-07 DIAGNOSIS — K5900 Constipation, unspecified: Secondary | ICD-10-CM | POA: Diagnosis not present

## 2023-10-07 DIAGNOSIS — G8929 Other chronic pain: Secondary | ICD-10-CM | POA: Diagnosis not present

## 2023-10-07 DIAGNOSIS — F17210 Nicotine dependence, cigarettes, uncomplicated: Secondary | ICD-10-CM | POA: Diagnosis not present

## 2023-10-07 DIAGNOSIS — Z9842 Cataract extraction status, left eye: Secondary | ICD-10-CM | POA: Diagnosis not present

## 2023-10-07 DIAGNOSIS — Z7984 Long term (current) use of oral hypoglycemic drugs: Secondary | ICD-10-CM | POA: Diagnosis not present

## 2023-10-07 DIAGNOSIS — Z792 Long term (current) use of antibiotics: Secondary | ICD-10-CM | POA: Diagnosis not present

## 2023-10-07 DIAGNOSIS — Z471 Aftercare following joint replacement surgery: Secondary | ICD-10-CM | POA: Diagnosis not present

## 2023-10-07 DIAGNOSIS — G43909 Migraine, unspecified, not intractable, without status migrainosus: Secondary | ICD-10-CM | POA: Diagnosis not present

## 2023-10-07 DIAGNOSIS — I10 Essential (primary) hypertension: Secondary | ICD-10-CM | POA: Diagnosis not present

## 2023-10-07 DIAGNOSIS — E119 Type 2 diabetes mellitus without complications: Secondary | ICD-10-CM | POA: Diagnosis not present

## 2023-10-07 DIAGNOSIS — Z556 Problems related to health literacy: Secondary | ICD-10-CM | POA: Diagnosis not present

## 2023-10-07 DIAGNOSIS — Z86018 Personal history of other benign neoplasm: Secondary | ICD-10-CM | POA: Diagnosis not present

## 2023-10-07 DIAGNOSIS — Z9841 Cataract extraction status, right eye: Secondary | ICD-10-CM | POA: Diagnosis not present

## 2023-10-07 DIAGNOSIS — M1990 Unspecified osteoarthritis, unspecified site: Secondary | ICD-10-CM | POA: Diagnosis not present

## 2023-10-09 DIAGNOSIS — I10 Essential (primary) hypertension: Secondary | ICD-10-CM | POA: Diagnosis not present

## 2023-10-09 DIAGNOSIS — Z556 Problems related to health literacy: Secondary | ICD-10-CM | POA: Diagnosis not present

## 2023-10-09 DIAGNOSIS — Z86018 Personal history of other benign neoplasm: Secondary | ICD-10-CM | POA: Diagnosis not present

## 2023-10-09 DIAGNOSIS — G8929 Other chronic pain: Secondary | ICD-10-CM | POA: Diagnosis not present

## 2023-10-09 DIAGNOSIS — Z7984 Long term (current) use of oral hypoglycemic drugs: Secondary | ICD-10-CM | POA: Diagnosis not present

## 2023-10-09 DIAGNOSIS — Z96612 Presence of left artificial shoulder joint: Secondary | ICD-10-CM | POA: Diagnosis not present

## 2023-10-09 DIAGNOSIS — K5900 Constipation, unspecified: Secondary | ICD-10-CM | POA: Diagnosis not present

## 2023-10-09 DIAGNOSIS — M1990 Unspecified osteoarthritis, unspecified site: Secondary | ICD-10-CM | POA: Diagnosis not present

## 2023-10-09 DIAGNOSIS — K219 Gastro-esophageal reflux disease without esophagitis: Secondary | ICD-10-CM | POA: Diagnosis not present

## 2023-10-09 DIAGNOSIS — G43909 Migraine, unspecified, not intractable, without status migrainosus: Secondary | ICD-10-CM | POA: Diagnosis not present

## 2023-10-09 DIAGNOSIS — M545 Low back pain, unspecified: Secondary | ICD-10-CM | POA: Diagnosis not present

## 2023-10-09 DIAGNOSIS — Z96653 Presence of artificial knee joint, bilateral: Secondary | ICD-10-CM | POA: Diagnosis not present

## 2023-10-09 DIAGNOSIS — Z9841 Cataract extraction status, right eye: Secondary | ICD-10-CM | POA: Diagnosis not present

## 2023-10-09 DIAGNOSIS — Z7982 Long term (current) use of aspirin: Secondary | ICD-10-CM | POA: Diagnosis not present

## 2023-10-09 DIAGNOSIS — Z471 Aftercare following joint replacement surgery: Secondary | ICD-10-CM | POA: Diagnosis not present

## 2023-10-09 DIAGNOSIS — Z9842 Cataract extraction status, left eye: Secondary | ICD-10-CM | POA: Diagnosis not present

## 2023-10-09 DIAGNOSIS — I69354 Hemiplegia and hemiparesis following cerebral infarction affecting left non-dominant side: Secondary | ICD-10-CM | POA: Diagnosis not present

## 2023-10-09 DIAGNOSIS — Z792 Long term (current) use of antibiotics: Secondary | ICD-10-CM | POA: Diagnosis not present

## 2023-10-09 DIAGNOSIS — F17210 Nicotine dependence, cigarettes, uncomplicated: Secondary | ICD-10-CM | POA: Diagnosis not present

## 2023-10-09 DIAGNOSIS — E119 Type 2 diabetes mellitus without complications: Secondary | ICD-10-CM | POA: Diagnosis not present

## 2023-10-09 DIAGNOSIS — Z9181 History of falling: Secondary | ICD-10-CM | POA: Diagnosis not present

## 2023-10-09 DIAGNOSIS — Z96642 Presence of left artificial hip joint: Secondary | ICD-10-CM | POA: Diagnosis not present

## 2023-10-11 DIAGNOSIS — K5900 Constipation, unspecified: Secondary | ICD-10-CM | POA: Diagnosis not present

## 2023-10-11 DIAGNOSIS — Z96642 Presence of left artificial hip joint: Secondary | ICD-10-CM | POA: Diagnosis not present

## 2023-10-11 DIAGNOSIS — M1990 Unspecified osteoarthritis, unspecified site: Secondary | ICD-10-CM | POA: Diagnosis not present

## 2023-10-11 DIAGNOSIS — Z792 Long term (current) use of antibiotics: Secondary | ICD-10-CM | POA: Diagnosis not present

## 2023-10-11 DIAGNOSIS — Z86018 Personal history of other benign neoplasm: Secondary | ICD-10-CM | POA: Diagnosis not present

## 2023-10-11 DIAGNOSIS — K219 Gastro-esophageal reflux disease without esophagitis: Secondary | ICD-10-CM | POA: Diagnosis not present

## 2023-10-11 DIAGNOSIS — Z7982 Long term (current) use of aspirin: Secondary | ICD-10-CM | POA: Diagnosis not present

## 2023-10-11 DIAGNOSIS — Z9841 Cataract extraction status, right eye: Secondary | ICD-10-CM | POA: Diagnosis not present

## 2023-10-11 DIAGNOSIS — I69354 Hemiplegia and hemiparesis following cerebral infarction affecting left non-dominant side: Secondary | ICD-10-CM | POA: Diagnosis not present

## 2023-10-11 DIAGNOSIS — Z556 Problems related to health literacy: Secondary | ICD-10-CM | POA: Diagnosis not present

## 2023-10-11 DIAGNOSIS — Z471 Aftercare following joint replacement surgery: Secondary | ICD-10-CM | POA: Diagnosis not present

## 2023-10-11 DIAGNOSIS — Z9842 Cataract extraction status, left eye: Secondary | ICD-10-CM | POA: Diagnosis not present

## 2023-10-11 DIAGNOSIS — M545 Low back pain, unspecified: Secondary | ICD-10-CM | POA: Diagnosis not present

## 2023-10-11 DIAGNOSIS — Z96612 Presence of left artificial shoulder joint: Secondary | ICD-10-CM | POA: Diagnosis not present

## 2023-10-11 DIAGNOSIS — F17210 Nicotine dependence, cigarettes, uncomplicated: Secondary | ICD-10-CM | POA: Diagnosis not present

## 2023-10-11 DIAGNOSIS — Z9181 History of falling: Secondary | ICD-10-CM | POA: Diagnosis not present

## 2023-10-11 DIAGNOSIS — I10 Essential (primary) hypertension: Secondary | ICD-10-CM | POA: Diagnosis not present

## 2023-10-11 DIAGNOSIS — G8929 Other chronic pain: Secondary | ICD-10-CM | POA: Diagnosis not present

## 2023-10-11 DIAGNOSIS — E119 Type 2 diabetes mellitus without complications: Secondary | ICD-10-CM | POA: Diagnosis not present

## 2023-10-11 DIAGNOSIS — Z96653 Presence of artificial knee joint, bilateral: Secondary | ICD-10-CM | POA: Diagnosis not present

## 2023-10-11 DIAGNOSIS — G43909 Migraine, unspecified, not intractable, without status migrainosus: Secondary | ICD-10-CM | POA: Diagnosis not present

## 2023-10-11 DIAGNOSIS — Z7984 Long term (current) use of oral hypoglycemic drugs: Secondary | ICD-10-CM | POA: Diagnosis not present

## 2023-10-15 ENCOUNTER — Ambulatory Visit (INDEPENDENT_AMBULATORY_CARE_PROVIDER_SITE_OTHER): Payer: 59 | Admitting: Physician Assistant

## 2023-10-15 DIAGNOSIS — Z96642 Presence of left artificial hip joint: Secondary | ICD-10-CM

## 2023-10-15 NOTE — Progress Notes (Signed)
 Post-Op Visit Note   Patient: Monica Watson           Date of Birth: 09-Jan-1956           MRN: [id number] Visit Date: 10/15/2023 PCP: Jonne Netters, MD   Assessment & Plan:  Chief Complaint:  Chief Complaint  Patient presents with   Left Hip - Routine Post Op   Visit Diagnoses:  1. Status post total replacement of left hip     Plan: Patient is a pleasant 68 year old female who comes in today 2 weeks status post left total hip replacement for 09/30/23.  She has been doing okay.  She has been taking oxycodone  and Robaxin  for pain.  She has been taking baby aspirin  only once daily for DVT prophylaxis.  She has been getting home health physical therapy and is ambulating with a walker.  Examination of the left hip reveals a well-healed surgical incision for the most part.  Nylon sutures in place.  There is 1 small area where of abrasion.  No signs of infection or cellulitis.  No drainage.  Calf soft nontender.  She is neurovascularly intact distally.  Today, her stitches were removed and Steri-Strips applied.  Xeroform was applied to the area.  At this point, I have reinforced the need to take a baby aspirin  twice daily for the next 4 weeks.  She will continue covering the abraded area with Xeroform and a Band-Aid until it has healed.  She would like to go to outpatient physical therapy and a referral has been made.  She will get further narcotics from pain management and is due for these tomorrow.  She will follow-up with us  in 4 weeks for repeat evaluation and AP pelvis x-rays.  Call with concerns or questions in the meantime.  Follow-Up Instructions: Return in about 4 weeks (around 11/12/2023).   Orders:  Orders Placed This Encounter  Procedures   Ambulatory referral to Physical Therapy   No orders of the defined types were placed in this encounter.   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   Osteoarthritis 02/19/2022   Abnormal uterine bleeding (AUB) 02/08/2022   Internal hemorrhoids 07/28/2021   Cervical lymphadenopathy 03/11/2021   Polyethylene wear of right knee prosthesis, 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/15/2020   Sialadenitis 11/23/2020   Abdominal pain 10/23/2020   Bartholin cyst 07/29/2020   History of total knee replacement, left 06/13/2020   Status post revision of total replacement of right knee 06/13/2020   Chronic back pain 06/09/2020   Vision changes 12/31/2019   Bilateral knee pain 10/08/2019   Poor dentition 10/08/2019   Healthcare maintenance 10/08/2019   Chronic left shoulder pain 08/31/2019   Hemorrhoids 04/04/2019   Pre-diabetes 01/01/2019   Hot flashes 01/01/2019   Onychomycosis 01/01/2019   Past Medical History:  Diagnosis Date   Arthritis    Cerebrovascular disease    Depression    GERD (gastroesophageal reflux disease)    Headache    Migraines   History of pneumonia    Hypertension    Stroke (HCC) 2010  Left sided weakness   Type 2 diabetes mellitus (HCC)     Family History  Problem Relation Age of Onset   Colon cancer Neg Hx    Esophageal cancer Neg Hx    Stomach cancer Neg Hx     Past Surgical History:  Procedure Laterality Date   bilateral knee replacements      CATARACT EXTRACTION, BILATERAL     FRACTURE SURGERY Right    arm as a child   gallstones removed     I & D KNEE WITH POLY EXCHANGE Right 12/16/2020   Procedure: POLY EXCHANGE RIGHT KNEE;  Surgeon: Arnie Lao, MD;  Location: [facility] ORS;  Service: Orthopedics;  Laterality: Right;   KNEE ARTHROSCOPY  Bilateral    left carpal tunnel release      PAROTIDECTOMY Left 02/20/2023   Procedure: SUPERFICIAL PAROTIDECTOMY WITH FACIAL NERVE MONITORING;  Surgeon: Rush Coupe, MD;  Location: [facility] OR;  Service: ENT;  Laterality: Left;   REVERSE SHOULDER ARTHROPLASTY Left 04/23/2022   Procedure: LEFT REVERSE SHOULDER ARTHROPLASTY;  Surgeon: Wilhelmenia Harada, MD;  Location: [facility] OR;  Service: Orthopedics;  Laterality: Left;   SHOULDER ARTHROSCOPY Bilateral    THYROIDECTOMY     TOTAL HIP ARTHROPLASTY Left 09/30/2023   Procedure: LEFT TOTAL HIP ARTHROPLASTY ANTERIOR APPROACH;  Surgeon: Wes Hamman, MD;  Location: [facility] OR;  Service: Orthopedics;  Laterality: Left;  3-C   Social History   Occupational History   Occupation: disability  Tobacco Use   Smoking status: Every Day    Current packs/day: 0.50    Average packs/day: 0.5 packs/day for 46.0 years (23.0 ttl pk-yrs)    Types: Cigarettes    Passive exposure: Current   Smokeless tobacco: Never  Vaping Use   Vaping status: Never Used  Substance and Sexual Activity   Alcohol use: Never   Drug use: Never   Sexual activity: Yes

## 2023-10-16 DIAGNOSIS — I10 Essential (primary) hypertension: Secondary | ICD-10-CM | POA: Diagnosis not present

## 2023-10-16 DIAGNOSIS — Z79899 Other long term (current) drug therapy: Secondary | ICD-10-CM | POA: Diagnosis not present

## 2023-10-16 DIAGNOSIS — M25552 Pain in left hip: Secondary | ICD-10-CM | POA: Diagnosis not present

## 2023-10-16 DIAGNOSIS — F17210 Nicotine dependence, cigarettes, uncomplicated: Secondary | ICD-10-CM | POA: Diagnosis not present

## 2023-10-16 DIAGNOSIS — M5416 Radiculopathy, lumbar region: Secondary | ICD-10-CM | POA: Diagnosis not present

## 2023-10-17 DIAGNOSIS — Z86018 Personal history of other benign neoplasm: Secondary | ICD-10-CM | POA: Diagnosis not present

## 2023-10-17 DIAGNOSIS — F17210 Nicotine dependence, cigarettes, uncomplicated: Secondary | ICD-10-CM | POA: Diagnosis not present

## 2023-10-17 DIAGNOSIS — I10 Essential (primary) hypertension: Secondary | ICD-10-CM | POA: Diagnosis not present

## 2023-10-17 DIAGNOSIS — Z556 Problems related to health literacy: Secondary | ICD-10-CM | POA: Diagnosis not present

## 2023-10-17 DIAGNOSIS — Z9841 Cataract extraction status, right eye: Secondary | ICD-10-CM | POA: Diagnosis not present

## 2023-10-17 DIAGNOSIS — Z9181 History of falling: Secondary | ICD-10-CM | POA: Diagnosis not present

## 2023-10-17 DIAGNOSIS — Z96642 Presence of left artificial hip joint: Secondary | ICD-10-CM | POA: Diagnosis not present

## 2023-10-17 DIAGNOSIS — Z7984 Long term (current) use of oral hypoglycemic drugs: Secondary | ICD-10-CM | POA: Diagnosis not present

## 2023-10-17 DIAGNOSIS — Z9842 Cataract extraction status, left eye: Secondary | ICD-10-CM | POA: Diagnosis not present

## 2023-10-17 DIAGNOSIS — Z96612 Presence of left artificial shoulder joint: Secondary | ICD-10-CM | POA: Diagnosis not present

## 2023-10-17 DIAGNOSIS — Z96653 Presence of artificial knee joint, bilateral: Secondary | ICD-10-CM | POA: Diagnosis not present

## 2023-10-17 DIAGNOSIS — I69354 Hemiplegia and hemiparesis following cerebral infarction affecting left non-dominant side: Secondary | ICD-10-CM | POA: Diagnosis not present

## 2023-10-17 DIAGNOSIS — K5900 Constipation, unspecified: Secondary | ICD-10-CM | POA: Diagnosis not present

## 2023-10-17 DIAGNOSIS — Z792 Long term (current) use of antibiotics: Secondary | ICD-10-CM | POA: Diagnosis not present

## 2023-10-17 DIAGNOSIS — G43909 Migraine, unspecified, not intractable, without status migrainosus: Secondary | ICD-10-CM | POA: Diagnosis not present

## 2023-10-17 DIAGNOSIS — G8929 Other chronic pain: Secondary | ICD-10-CM | POA: Diagnosis not present

## 2023-10-17 DIAGNOSIS — K219 Gastro-esophageal reflux disease without esophagitis: Secondary | ICD-10-CM | POA: Diagnosis not present

## 2023-10-17 DIAGNOSIS — Z471 Aftercare following joint replacement surgery: Secondary | ICD-10-CM | POA: Diagnosis not present

## 2023-10-17 DIAGNOSIS — M1990 Unspecified osteoarthritis, unspecified site: Secondary | ICD-10-CM | POA: Diagnosis not present

## 2023-10-17 DIAGNOSIS — Z7982 Long term (current) use of aspirin: Secondary | ICD-10-CM | POA: Diagnosis not present

## 2023-10-17 DIAGNOSIS — M545 Low back pain, unspecified: Secondary | ICD-10-CM | POA: Diagnosis not present

## 2023-10-17 DIAGNOSIS — E119 Type 2 diabetes mellitus without complications: Secondary | ICD-10-CM | POA: Diagnosis not present

## 2023-10-29 ENCOUNTER — Encounter: Payer: Self-pay | Admitting: Family Medicine

## 2023-10-31 ENCOUNTER — Other Ambulatory Visit: Payer: Self-pay | Admitting: Physician Assistant

## 2023-10-31 ENCOUNTER — Other Ambulatory Visit: Payer: Self-pay | Admitting: Family Medicine

## 2023-10-31 DIAGNOSIS — E119 Type 2 diabetes mellitus without complications: Secondary | ICD-10-CM

## 2023-11-01 ENCOUNTER — Other Ambulatory Visit: Payer: Self-pay

## 2023-11-05 ENCOUNTER — Other Ambulatory Visit: Payer: Self-pay

## 2023-11-05 NOTE — Patient Instructions (Signed)
 Visit Information  Thank you for taking time to visit with me today. Please don't hesitate to contact me if I can be of assistance to you before our next scheduled appointment.  Your next care management appointment is no further scheduled appointments.      Please call the care guide team at [phone] if you need to cancel, schedule, or reschedule an appointment.   Please call the Suicide and Crisis Lifeline: 988 call the [place]  National Suicide Prevention Lifeline: (716)377-3922 or TTY: 2052722180 TTY 4352964442) to talk to a trained counselor call 1-800-273-TALK (toll free, 24 hour hotline) go to [facility] [address] [place] [phone]) call 911 if you are experiencing a Mental Health or Behavioral Health Crisis or need someone to talk to.  Valora Gear, Florestine Hurl, MHA [place]  [facility] Social Worker, Population Health [phone]

## 2023-11-05 NOTE — Patient Outreach (Signed)
 Complex Care Management   Visit Note  11/05/2023  Name:  Monica Watson MRN: [id number] DOB: 07/13/1955  Situation: Referral received for Complex Care Management related to housing I obtained verbal consent from Patient.  Visit completed with patient  on the phone  Background:   Past Medical History:  Diagnosis Date   Arthritis    Cerebrovascular disease    Depression    GERD (gastroesophageal reflux disease)    Headache    Migraines   History of pneumonia    Hypertension    Stroke [facility]) 2010   Left sided weakness   Type 2 diabetes mellitus (HCC)     Assessment: SW completed a telephone outreach with patient, she has located an apartment that is on the second floor. The landlord of the apartment also has a house that is not ready yet. Patient states she will be able to move into the house once it is ready. No other resources are needed at this time.    SDOH Interventions    Flowsheet Row Patient Outreach from 11/05/2023 in [place] POPULATION HEALTH DEPARTMENT Care Coordination from 09/13/2023 in [facility] Community Care Coordination Care Coordination from 08/26/2023 in [facility] Community Care Coordination Care Coordination from 08/09/2023 in [facility] Care Coordination Clinical Support from 05/06/2023 in [facility] [place] Office Visit from 09/28/2021 in [place]  SDOH Interventions        Food Insecurity Interventions Patient Declined Intervention Not Indicated Intervention Not Indicated Intervention Not Indicated Intervention Not Indicated --  Housing Interventions -- Intervention Not Indicated  [The patient is wanting to move and will be contacting the landlord with [facility] and the SW will mail the affordable  housing list] Intervention Not Indicated  [Patient will be relocated to another apartment by [facility] due to floor needing to be repaired.] --  Intervention Not Indicated --  Transportation Interventions -- -- Intervention Not Indicated Intervention Not Indicated  [[employer] transports to the doctor and medicine is delivered] Intervention Not Indicated Payor Benefit  Utilities Interventions -- -- Intervention Not Indicated Intervention Not Indicated Intervention Not Indicated --  Alcohol Usage Interventions -- -- -- -- Intervention Not Indicated (Score <7) --  Depression Interventions/Treatment  -- -- -- -- Currently on Treatment --  Financial Strain Interventions -- -- -- -- Intervention Not Indicated --  Physical Activity Interventions -- -- -- -- Intervention Not Indicated --  Stress Interventions -- -- -- -- Intervention Not Indicated --  Social Connections Interventions -- -- -- -- Intervention Not Indicated --  Health Literacy Interventions -- -- -- -- Intervention Not Indicated --       Recommendation:   No recommendations at this time.  Follow Up Plan:   Patient has met all care management goals. Care Management case will be closed. Patient has been provided contact information should new needs arise.   Valora Gear, Florestine Hurl, MHA [place]  [facility] Social Worker, Population Health [phone]

## 2023-11-08 DIAGNOSIS — I10 Essential (primary) hypertension: Secondary | ICD-10-CM | POA: Diagnosis not present

## 2023-11-08 DIAGNOSIS — F17210 Nicotine dependence, cigarettes, uncomplicated: Secondary | ICD-10-CM | POA: Diagnosis not present

## 2023-11-08 DIAGNOSIS — M5416 Radiculopathy, lumbar region: Secondary | ICD-10-CM | POA: Diagnosis not present

## 2023-11-08 DIAGNOSIS — M25552 Pain in left hip: Secondary | ICD-10-CM | POA: Diagnosis not present

## 2023-11-12 ENCOUNTER — Ambulatory Visit (INDEPENDENT_AMBULATORY_CARE_PROVIDER_SITE_OTHER): Admitting: Physician Assistant

## 2023-11-12 ENCOUNTER — Other Ambulatory Visit (INDEPENDENT_AMBULATORY_CARE_PROVIDER_SITE_OTHER)

## 2023-11-12 DIAGNOSIS — Z96642 Presence of left artificial hip joint: Secondary | ICD-10-CM | POA: Diagnosis not present

## 2023-11-12 NOTE — Progress Notes (Signed)
 Post-Op Visit Note   Patient: Monica Watson           Date of Birth: [birth year]           MRN: [id number] Visit Date: 11/12/2023 PCP: Jonne Netters, MD   Assessment & Plan:  Chief Complaint:  Chief Complaint  Patient presents with   Left Hip - Routine Post Op   Visit Diagnoses:  1. Status post left hip replacement     Plan: Patient is a pleasant 68 year old female who comes in today 6 weeks status post left total hip replacement 09/30/2023.  She has been doing much better but is still in some pain.  She is taking Robaxin  and oxycodone .  She does see pain management for this.  She is ambulating with a single-point cane.  She has been compliant taking a baby aspirin  twice daily for DVT prophylaxis.  Examination of the left hip reveals a fully healed surgical scar without complication.  Painless hip flexion and logroll.  Calf is soft nontender.  She is neurovascularly intact distally.  At this point she may discontinue the baby aspirin  from DVT prophylaxis standpoint.  She will continue to advance with activity as tolerated.  She will follow-up with us  in 6 weeks for repeat evaluation.  Dental prophylaxis reinforced.  Call with concerns or questions.  Follow-Up Instructions: Return in about 6 weeks (around 12/24/2023).   Orders:  Orders Placed This Encounter  Procedures   XR Pelvis 1-2 Views   No orders of the defined types were placed in this encounter.   Imaging: XR Pelvis 1-2 Views Result Date: 11/12/2023 X-rays show well-seated prosthesis without complication   PMFS History: Patient Active Problem List   Diagnosis Date Noted   Status post total replacement of left hip 09/30/2023   Primary osteoarthritis of left hip 09/29/2023   Viral gastroenteritis 04/04/2023   Mass of uterine adnexa 04/03/2023   Acute gastroenteritis 03/31/2023   Rotator cuff arthropathy of left shoulder 04/23/2022   Type 2 diabetes mellitus without complication, without long-term current use of  insulin  (HCC) 03/20/2022   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   Osteoarthritis 02/19/2022   Abnormal uterine bleeding (AUB) 02/08/2022   Internal hemorrhoids 07/28/2021   Cervical lymphadenopathy 03/11/2021   Polyethylene wear of right knee prosthesis, 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/15/2020   Sialadenitis 11/23/2020   Abdominal pain 10/23/2020   Bartholin cyst 07/29/2020   History of total knee replacement, left 06/13/2020   Status post revision of total replacement of right knee 06/13/2020   Chronic back pain 06/09/2020   Vision changes 12/31/2019   Bilateral knee pain 10/08/2019   Poor dentition 10/08/2019   Healthcare maintenance 10/08/2019   Chronic left shoulder pain 08/31/2019   Hemorrhoids 04/04/2019   Pre-diabetes 01/01/2019   Hot flashes 01/01/2019   Onychomycosis 01/01/2019   Past Medical History:  Diagnosis Date   Arthritis    Cerebrovascular disease    Depression    GERD (gastroesophageal reflux disease)    Headache    Migraines   History of pneumonia    Hypertension    Stroke (HCC) 2010   Left sided weakness   Type 2 diabetes mellitus (HCC)     Family History  Problem Relation Age of Onset   Colon cancer Neg Hx    Esophageal cancer Neg Hx    Stomach cancer Neg Hx     Past Surgical History:  Procedure Laterality Date   bilateral knee replacements      CATARACT EXTRACTION, BILATERAL  FRACTURE SURGERY Right    arm as a child   gallstones removed     I & D KNEE WITH POLY EXCHANGE Right 12/16/2020   Procedure: POLY EXCHANGE RIGHT KNEE;  Surgeon: Arnie Lao, MD;  Location: [facility];  Service: Orthopedics;  Laterality: Right;   KNEE ARTHROSCOPY Bilateral    left carpal tunnel release      PAROTIDECTOMY Left 02/20/2023   Procedure: SUPERFICIAL PAROTIDECTOMY WITH FACIAL NERVE MONITORING;  Surgeon: Rush Coupe, MD;  [facility];  Service: ENT;  Laterality: Left;   REVERSE SHOULDER ARTHROPLASTY Left 04/23/2022   Procedure: LEFT REVERSE SHOULDER  ARTHROPLASTY;  Surgeon: Wilhelmenia Harada, MD;  Location: [facility] OR;  Service: Orthopedics;  Laterality: Left;   SHOULDER ARTHROSCOPY Bilateral    THYROIDECTOMY     TOTAL HIP ARTHROPLASTY Left 09/30/2023   Procedure: LEFT TOTAL HIP ARTHROPLASTY ANTERIOR APPROACH;  Surgeon: Wes Hamman, MD;  Location: [facility] OR;  Service: Orthopedics;  Laterality: Left;  3-C   Social History   Occupational History   Occupation: disability  Tobacco Use   Smoking status: Every Day    Current packs/day: 0.50    Average packs/day: 0.5 packs/day for 46.0 years (23.0 ttl pk-yrs)    Types: Cigarettes    Passive exposure: Current   Smokeless tobacco: Never  Vaping Use   Vaping status: Never Used  Substance and Sexual Activity   Alcohol use: Never   Drug use: Never   Sexual activity: Yes

## 2023-12-09 ENCOUNTER — Ambulatory Visit: Admitting: Family Medicine

## 2023-12-09 NOTE — Progress Notes (Deleted)
    SUBJECTIVE:   CHIEF COMPLAINT / HPI:   Left side pain  Diabetes Current Regimen: Metformin  500 mg daily CBGs: ***  Last A1c:  Lab Results  Component Value Date   HGBA1C 6.3 (H) 09/30/2023    Denies polyuria, polydipsia, hypoglycemia *** Last Eye Exam: *** Statin: *** ACE/ARB: ***   *Lipid panel?  PERTINENT  PMH / PSH: HTN, T2DM, h/o CVA, depression, chronic back pain   OBJECTIVE:   There were no vitals taken for this visit. ***  General: NAD, pleasant, able to participate in exam Cardiac: RRR, no murmurs. Respiratory: CTAB, normal effort, No wheezes, rales or rhonchi Abdomen: Bowel sounds present, nontender, nondistended Extremities: no edema or cyanosis. Skin: warm and dry, no rashes noted Neuro: alert, no obvious focal deficits Psych: Normal affect and mood  ASSESSMENT/PLAN:   No problem-specific Assessment & Plan notes found for this encounter.     Dr. Jonne Netters, DO [facility]    {    This will disappear when note is signed, click to select method of visit    :1}

## 2023-12-10 DIAGNOSIS — I10 Essential (primary) hypertension: Secondary | ICD-10-CM | POA: Diagnosis not present

## 2023-12-10 DIAGNOSIS — G8929 Other chronic pain: Secondary | ICD-10-CM | POA: Diagnosis not present

## 2023-12-10 DIAGNOSIS — F17210 Nicotine dependence, cigarettes, uncomplicated: Secondary | ICD-10-CM | POA: Diagnosis not present

## 2023-12-10 DIAGNOSIS — M25511 Pain in right shoulder: Secondary | ICD-10-CM | POA: Diagnosis not present

## 2023-12-10 DIAGNOSIS — M5416 Radiculopathy, lumbar region: Secondary | ICD-10-CM | POA: Diagnosis not present

## 2023-12-10 NOTE — Progress Notes (Deleted)
    SUBJECTIVE:   CHIEF COMPLAINT / HPI:   Left side pain  Diabetes Current Regimen: Metformin  500 mg daily CBGs: ***  Last A1c:  Lab Results  Component Value Date   HGBA1C 6.3 (H) 09/30/2023    Denies polyuria, polydipsia, hypoglycemia *** Last Eye Exam: *** Statin: *** ACE/ARB: ***   *Lipid panel?  PERTINENT  PMH / PSH: HTN, T2DM, h/o CVA, depression, chronic back pain   OBJECTIVE:   There were no vitals taken for this visit. ***  General: NAD, pleasant, able to participate in exam Cardiac: RRR, no murmurs. Respiratory: CTAB, normal effort, No wheezes, rales or rhonchi Abdomen: Bowel sounds present, nontender, nondistended Extremities: no edema or cyanosis. Skin: warm and dry, no rashes noted Neuro: alert, no obvious focal deficits Psych: Normal affect and mood  ASSESSMENT/PLAN:   No problem-specific Assessment & Plan notes found for this encounter.     Dr. Jonne Netters, DO [facility]    {    This will disappear when note is signed, click to select method of visit    :1}

## 2023-12-11 ENCOUNTER — Ambulatory Visit

## 2024-01-07 ENCOUNTER — Other Ambulatory Visit (INDEPENDENT_AMBULATORY_CARE_PROVIDER_SITE_OTHER)

## 2024-01-07 ENCOUNTER — Ambulatory Visit (INDEPENDENT_AMBULATORY_CARE_PROVIDER_SITE_OTHER): Admitting: Orthopaedic Surgery

## 2024-01-07 DIAGNOSIS — M25561 Pain in right knee: Secondary | ICD-10-CM

## 2024-01-07 DIAGNOSIS — G8929 Other chronic pain: Secondary | ICD-10-CM | POA: Diagnosis not present

## 2024-01-07 DIAGNOSIS — M25511 Pain in right shoulder: Secondary | ICD-10-CM | POA: Diagnosis not present

## 2024-01-07 NOTE — Progress Notes (Signed)
 Office Visit Note   Patient: Monica Watson           Date of Birth: August 30, 1955           MRN: [id number] Visit Date: 01/07/2024              Requested by: Theophilus Pagan, MD [address] [place],  [place] [address] PCP: Theophilus Pagan, MD   Assessment & Plan: Visit Diagnoses:  1. Chronic pain of right knee   2. Chronic right shoulder pain     Plan: History of Present Illness The patient presents with leg pain and shoulder discomfort.  She experiences leg pain from the inner thigh downwards, particularly around the veins, causing significant discomfort.  Shoulder pain extends from the shoulder to the hand. She had x-rays of the shoulder a month ago due to severe discomfort.  She sleeps on her right side due to previous shoulder and recent left hip surgeries. She occasionally experiences trouble with the left hip but generally feels it is stable.  Physical Exam MUSCULOSKELETAL: Pain on right hip movement.  No trochanteric tenderness.  Preserved range of motion.  Pain on right shoulder movement.  Manual muscle testing of the rotator cuff shows normal strength.  Results RADIOLOGY Shoulder X-ray: Degenerative joint disease (11/2023)  Assessment and Plan Arthritis Chronic shoulder and hip pain due to arthritis. - Will refer to Dr. Burnetta for intra-articular right hip and right shoulder injections.  Follow-Up Instructions: No follow-ups on file.   Orders:  Orders Placed This Encounter  Procedures   XR KNEE 3 VIEW RIGHT   XR Shoulder Right   AMB referral to sports medicine   No orders of the defined types were placed in this encounter.     Procedures: No procedures performed   Clinical Data: No additional findings.   Subjective: Chief Complaint  Patient presents with   Right Shoulder - Pain   Right Knee - Pain    HPI  Review of Systems  Constitutional: Negative.   HENT: Negative.    Eyes: Negative.   Respiratory: Negative.    Cardiovascular:  Negative.   Endocrine: Negative.   Musculoskeletal: Negative.   Neurological: Negative.   Hematological: Negative.   Psychiatric/Behavioral: Negative.    All other systems reviewed and are negative.    Objective: Vital Signs: There were no vitals taken for this visit.  Physical Exam Vitals and nursing note reviewed.  Constitutional:      Appearance: She is well-developed.  HENT:     Head: Atraumatic.     Nose: Nose normal.  Eyes:     Extraocular Movements: Extraocular movements intact.  Cardiovascular:     Pulses: Normal pulses.  Pulmonary:     Effort: Pulmonary effort is normal.  Abdominal:     Palpations: Abdomen is soft.  Musculoskeletal:     Cervical back: Neck supple.  Skin:    General: Skin is warm.     Capillary Refill: Capillary refill takes less than 2 seconds.  Neurological:     Mental Status: She is alert. Mental status is at baseline.  Psychiatric:        Behavior: Behavior normal.        Thought Content: Thought content normal.        Judgment: Judgment normal.     Ortho Exam  Specialty Comments:  No specialty comments available.  Imaging: XR Shoulder Right Result Date: 01/07/2024 X-rays of the right shoulder show mild degenerative changes without any acute abnormalities.  XR KNEE 3 VIEW RIGHT Result Date: 01/07/2024 X-rays of the right knee show a prior right total knee arthroplasty with press-fit components.  There is no evidence of loosening or subsidence.    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   Osteoarthritis  02/19/2022   Abnormal uterine bleeding (AUB) 02/08/2022   Internal hemorrhoids 07/28/2021   Cervical lymphadenopathy 03/11/2021   Polyethylene wear of right knee prosthesis, 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/15/2020   Sialadenitis 11/23/2020   Abdominal pain 10/23/2020   Bartholin cyst 07/29/2020   History of total knee replacement, left 06/13/2020   Status post revision of total replacement of right knee 06/13/2020   Chronic back pain 06/09/2020   Vision changes 12/31/2019   Bilateral knee pain 10/08/2019   Poor dentition 10/08/2019   Healthcare maintenance 10/08/2019   Chronic left shoulder pain 08/31/2019   Hemorrhoids 04/04/2019   Pre-diabetes 01/01/2019   Hot flashes 01/01/2019   Onychomycosis 01/01/2019   Past Medical History:  Diagnosis Date   Arthritis    Cerebrovascular disease    Depression    GERD (gastroesophageal reflux disease)    Headache    Migraines   History of pneumonia    Hypertension    Stroke (HCC) 2010   Left sided weakness   Type 2 diabetes mellitus (HCC)     Family History  Problem Relation Age of Onset   Colon cancer Neg Hx    Esophageal cancer Neg Hx    Stomach cancer Neg Hx     Past Surgical History:  Procedure Laterality Date   bilateral knee replacements      CATARACT EXTRACTION, BILATERAL     FRACTURE SURGERY Right    arm as a child   gallstones removed     I & D KNEE WITH POLY EXCHANGE Right 12/16/2020   Procedure: POLY EXCHANGE RIGHT KNEE;  Surgeon: Vernetta Lonni GRADE, MD;  Location: [facility];  Service: Orthopedics;  Laterality: Right;   KNEE ARTHROSCOPY Bilateral    left carpal tunnel release      PAROTIDECTOMY Left 02/20/2023   Procedure: SUPERFICIAL PAROTIDECTOMY WITH FACIAL NERVE MONITORING;  Surgeon: Luciano Standing, MD;  Location: [facility];  Service: ENT;  Laterality: Left;   REVERSE SHOULDER ARTHROPLASTY Left 04/23/2022   Procedure: LEFT REVERSE SHOULDER ARTHROPLASTY;  Surgeon: Genelle Standing, MD;  Location: [facility] OR;  Service:  Orthopedics;  Laterality: Left;   SHOULDER ARTHROSCOPY Bilateral    THYROIDECTOMY     TOTAL HIP ARTHROPLASTY Left 09/30/2023   Procedure: LEFT TOTAL HIP ARTHROPLASTY ANTERIOR APPROACH;  Surgeon: Jerri Kay HERO, MD;  Location: [facility] OR;  Service: Orthopedics;  Laterality: Left;  3-C   Social History   Occupational History   Occupation: disability  Tobacco Use   Smoking status: Every Day    Current packs/day: 0.50    Average packs/day: 0.5 packs/day for 46.0 years (23.0 ttl pk-yrs)    Types: Cigarettes    Passive exposure: Current   Smokeless tobacco: Never  Vaping Use   Vaping status: Never Used  Substance and Sexual Activity   Alcohol use: Never   Drug use:  Never   Sexual activity: Yes

## 2024-01-09 DIAGNOSIS — M5416 Radiculopathy, lumbar region: Secondary | ICD-10-CM | POA: Diagnosis not present

## 2024-01-09 DIAGNOSIS — F17210 Nicotine dependence, cigarettes, uncomplicated: Secondary | ICD-10-CM | POA: Diagnosis not present

## 2024-01-09 DIAGNOSIS — M25511 Pain in right shoulder: Secondary | ICD-10-CM | POA: Diagnosis not present

## 2024-01-09 DIAGNOSIS — Z7409 Other reduced mobility: Secondary | ICD-10-CM | POA: Diagnosis not present

## 2024-01-09 DIAGNOSIS — I10 Essential (primary) hypertension: Secondary | ICD-10-CM | POA: Diagnosis not present

## 2024-01-09 DIAGNOSIS — G8929 Other chronic pain: Secondary | ICD-10-CM | POA: Diagnosis not present

## 2024-01-13 DIAGNOSIS — Z7409 Other reduced mobility: Secondary | ICD-10-CM | POA: Diagnosis not present

## 2024-01-24 ENCOUNTER — Encounter: Admitting: Sports Medicine

## 2024-01-27 ENCOUNTER — Other Ambulatory Visit: Payer: Self-pay | Admitting: Family Medicine

## 2024-01-27 DIAGNOSIS — R0981 Nasal congestion: Secondary | ICD-10-CM

## 2024-01-29 ENCOUNTER — Telehealth: Payer: Self-pay | Admitting: Orthopaedic Surgery

## 2024-01-29 NOTE — Telephone Encounter (Signed)
 Pt asked for a call back from Tammy about letter faxed to housing [employer]. Please call pt about this matter at [phone].

## 2024-01-30 ENCOUNTER — Telehealth: Payer: Self-pay | Admitting: Orthopaedic Surgery

## 2024-01-30 NOTE — Telephone Encounter (Signed)
 Pt called about letter from Jerri being faxed to [employer]. Notified pt letter was faxed 8/5. Letter in media tab.

## 2024-02-06 ENCOUNTER — Other Ambulatory Visit: Payer: Self-pay

## 2024-02-06 ENCOUNTER — Encounter: Payer: Self-pay | Admitting: Sports Medicine

## 2024-02-06 ENCOUNTER — Ambulatory Visit: Admitting: Sports Medicine

## 2024-02-06 DIAGNOSIS — M25511 Pain in right shoulder: Secondary | ICD-10-CM | POA: Diagnosis not present

## 2024-02-06 DIAGNOSIS — M1611 Unilateral primary osteoarthritis, right hip: Secondary | ICD-10-CM | POA: Diagnosis not present

## 2024-02-06 DIAGNOSIS — M25551 Pain in right hip: Secondary | ICD-10-CM

## 2024-02-06 DIAGNOSIS — G8929 Other chronic pain: Secondary | ICD-10-CM

## 2024-02-06 MED ORDER — LIDOCAINE HCL 1 % IJ SOLN
4.0000 mL | INTRAMUSCULAR | Status: AC | PRN
  Administered 2024-02-06: 4 mL

## 2024-02-06 MED ORDER — METHYLPREDNISOLONE ACETATE 40 MG/ML IJ SUSP
40.0000 mg | INTRAMUSCULAR | Status: AC | PRN
  Administered 2024-02-06: 40 mg via INTRA_ARTICULAR

## 2024-02-06 MED ORDER — LIDOCAINE HCL 1 % IJ SOLN
2.0000 mL | INTRAMUSCULAR | Status: AC | PRN
  Administered 2024-02-06: 2 mL

## 2024-02-06 MED ORDER — BUPIVACAINE HCL 0.25 % IJ SOLN
2.0000 mL | INTRAMUSCULAR | Status: AC | PRN
  Administered 2024-02-06: 2 mL via INTRA_ARTICULAR

## 2024-02-06 NOTE — Addendum Note (Signed)
 Addended by: Jimmi Sidener W III on: 02/06/2024 01:28 PM   Modules accepted: Level of Service

## 2024-02-06 NOTE — Progress Notes (Signed)
   Procedure Note  Patient: Monica Watson             Date of Birth: 1956/03/27           MRN: [id number]             Visit Date: 02/06/2024  Procedures: Visit Diagnoses:  1. Chronic right shoulder pain   2. Chronic right hip pain   3. Unilateral primary osteoarthritis, right hip    Large Joint Inj: R glenohumeral on 02/06/2024 1:08 PM Indications: pain Details: 22 G 3.5 in needle, ultrasound-guided posterior approach Medications: 2 mL lidocaine  1 %; 2 mL bupivacaine  0.25 %; 40 mg methylPREDNISolone  acetate 40 MG/ML Outcome: tolerated well, no immediate complications  US -guided glenohumeral joint injection, right shoulder After discussion on risks/benefits/indications, informed verbal consent was obtained. A timeout was then performed. The patient was positioned lying lateral recumbent on examination table. The patient's shoulder was prepped with betadine  and multiple alcohol swabs and utilizing ultrasound guidance, the patient's glenohumeral joint was identified on ultrasound. Using ultrasound guidance a 22-gauge, 3.5 inch needle with a mixture of 2:2:1 cc's lidocaine :bupivicaine:depomedrol was directed from a lateral to medial direction via in-plane technique into the glenohumeral joint with visualization of appropriate spread of injectate into the joint. Patient tolerated the procedure well without immediate complications.      Procedure, treatment alternatives, risks and benefits explained, specific risks discussed. Consent was given by the patient. Immediately prior to procedure a time out was called to verify the correct patient, procedure, equipment, support staff and site/side marked as required. Patient was prepped and draped in the usual sterile fashion.    Large Joint Inj: R hip joint on 02/06/2024 1:08 PM Indications: pain Details: 22 G 3.5 in needle, ultrasound-guided anterior approach Medications: 4 mL lidocaine  1 %; 40 mg methylPREDNISolone  acetate 40 MG/ML Outcome:  tolerated well, no immediate complications  Procedure: US -guided intra-articular hip injection, Right After discussion on risks/benefits/indications and informed verbal consent was obtained, a timeout was performed. Patient was lying supine on exam table. The hip was cleaned with betadine  and alcohol swabs. Then utilizing ultrasound guidance, the patient's femoral head and neck junction was identified and subsequently injected with 4:1 lidocaine :depomedrol via an in-plane approach with ultrasound visualization of the injectate administered into the hip joint. Patient tolerated procedure well without immediate complications.  Procedure, treatment alternatives, risks and benefits explained, specific risks discussed. Consent was given by the patient. Immediately prior to procedure a time out was called to verify the correct patient, procedure, equipment, support staff and site/side marked as required. Patient was prepped and draped in the usual sterile fashion.     - patient tolerated procedure well, discussed post-injection protocol - follow-up with Dr. Jerri as indicated; I am happy to see them as needed  Lonell Sprang, DO Primary Care Sports Medicine Physician  [facility] Orthopedics  This note was dictated using Dragon naturally speaking software and may contain errors in syntax, spelling, or content which have not been identified prior to signing this note.

## 2024-02-09 ENCOUNTER — Other Ambulatory Visit: Payer: Self-pay

## 2024-02-09 ENCOUNTER — Encounter (HOSPITAL_COMMUNITY): Payer: Self-pay

## 2024-02-09 ENCOUNTER — Emergency Department (HOSPITAL_COMMUNITY)

## 2024-02-09 ENCOUNTER — Emergency Department (HOSPITAL_COMMUNITY)
Admission: EM | Admit: 2024-02-09 | Discharge: 2024-02-09 | Disposition: A | Discharge status: Home / Self Care | Attending: Emergency Medicine | Admitting: Emergency Medicine

## 2024-02-09 DIAGNOSIS — R109 Unspecified abdominal pain: Secondary | ICD-10-CM

## 2024-02-09 DIAGNOSIS — E119 Type 2 diabetes mellitus without complications: Secondary | ICD-10-CM | POA: Insufficient documentation

## 2024-02-09 DIAGNOSIS — R1011 Right upper quadrant pain: Secondary | ICD-10-CM | POA: Diagnosis not present

## 2024-02-09 DIAGNOSIS — Z7984 Long term (current) use of oral hypoglycemic drugs: Secondary | ICD-10-CM | POA: Insufficient documentation

## 2024-02-09 DIAGNOSIS — Z87891 Personal history of nicotine dependence: Secondary | ICD-10-CM | POA: Insufficient documentation

## 2024-02-09 DIAGNOSIS — Z79899 Other long term (current) drug therapy: Secondary | ICD-10-CM | POA: Insufficient documentation

## 2024-02-09 DIAGNOSIS — R1031 Right lower quadrant pain: Secondary | ICD-10-CM | POA: Insufficient documentation

## 2024-02-09 DIAGNOSIS — Z9049 Acquired absence of other specified parts of digestive tract: Secondary | ICD-10-CM | POA: Diagnosis not present

## 2024-02-09 DIAGNOSIS — E871 Hypo-osmolality and hyponatremia: Secondary | ICD-10-CM | POA: Diagnosis not present

## 2024-02-09 DIAGNOSIS — I10 Essential (primary) hypertension: Secondary | ICD-10-CM | POA: Insufficient documentation

## 2024-02-09 DIAGNOSIS — Z7982 Long term (current) use of aspirin: Secondary | ICD-10-CM | POA: Insufficient documentation

## 2024-02-09 DIAGNOSIS — D1803 Hemangioma of intra-abdominal structures: Secondary | ICD-10-CM | POA: Diagnosis not present

## 2024-02-09 LAB — TROPONIN I (HIGH SENSITIVITY): Troponin I (High Sensitivity): 9 ng/L (ref <18)

## 2024-02-09 LAB — COMPREHENSIVE METABOLIC PANEL WITH GFR
ALT: 47 U/L — ABNORMAL HIGH (ref 0–44)
AST: 28 U/L (ref 15–41)
Albumin: 4 g/dL (ref 3.5–5.0)
Alkaline Phosphatase: 59 U/L (ref 38–126)
Anion gap: 10 (ref 5–15)
BUN: 25 mg/dL — ABNORMAL HIGH (ref 8–23)
CO2: 21 mmol/L — ABNORMAL LOW (ref 22–32)
Calcium: 9.5 mg/dL (ref 8.9–10.3)
Chloride: 102 mmol/L (ref 98–111)
Creatinine, Ser: 0.88 mg/dL (ref 0.44–1.00)
GFR, Estimated: >60 mL/min (ref >60)
Glucose, Bld: 161 mg/dL — ABNORMAL HIGH (ref 70–99)
Potassium: 4 mmol/L (ref 3.5–5.1)
Sodium: 133 mmol/L — ABNORMAL LOW (ref 135–145)
Total Bilirubin: 1.9 mg/dL — ABNORMAL HIGH (ref 0.0–1.2)
Total Protein: 7.2 g/dL (ref 6.5–8.1)

## 2024-02-09 LAB — URINALYSIS, ROUTINE W REFLEX MICROSCOPIC
Bilirubin Urine: NEGATIVE
Glucose, UA: NEGATIVE mg/dL
Ketones, ur: NEGATIVE mg/dL
Nitrite: NEGATIVE
Protein, ur: 30 mg/dL — AB
Specific Gravity, Urine: 1.026 (ref 1.005–1.030)
pH: 5 (ref 5.0–8.0)

## 2024-02-09 LAB — CBC
HCT: 47.8 % — ABNORMAL HIGH (ref 36.0–46.0)
Hemoglobin: 16.3 g/dL — ABNORMAL HIGH (ref 12.0–15.0)
MCH: 31.2 pg (ref 26.0–34.0)
MCHC: 34.1 g/dL (ref 30.0–36.0)
MCV: 91.4 fL (ref 80.0–100.0)
Platelets: 251 K/uL (ref 150–400)
RBC: 5.23 MIL/uL — ABNORMAL HIGH (ref 3.87–5.11)
RDW: 12.1 % (ref 11.5–15.5)
WBC: 7.5 K/uL (ref 4.0–10.5)
nRBC: 0 % (ref 0.0–0.2)

## 2024-02-09 LAB — LIPASE, BLOOD: Lipase: 25 U/L (ref 11–51)

## 2024-02-09 MED ORDER — OXYCODONE HCL 5 MG PO TABS
5.0000 mg | ORAL_TABLET | Freq: Once | ORAL | Status: AC
  Administered 2024-02-09: 5 mg via ORAL
  Filled 2024-02-09: qty 1

## 2024-02-09 MED ORDER — SODIUM CHLORIDE 0.9 % IV BOLUS
1000.0000 mL | Freq: Once | INTRAVENOUS | Status: AC
  Administered 2024-02-09: 1000 mL via INTRAVENOUS

## 2024-02-09 MED ORDER — OXYCODONE-ACETAMINOPHEN 5-325 MG PO TABS
1.0000 | ORAL_TABLET | Freq: Once | ORAL | Status: AC
  Administered 2024-02-09: 1 via ORAL
  Filled 2024-02-09: qty 1

## 2024-02-09 MED ORDER — ONDANSETRON 4 MG PO TBDP: 4.0000 mg | ORAL_TABLET | Freq: Three times a day (TID) | ORAL | 0 refills | Status: AC | PRN

## 2024-02-09 MED ORDER — IOHEXOL 350 MG/ML SOLN
75.0000 mL | Freq: Once | INTRAVENOUS | Status: AC | PRN
  Administered 2024-02-09: 75 mL via INTRAVENOUS

## 2024-02-09 NOTE — ED Provider Notes (Signed)
 [place] EMERGENCY DEPARTMENT AT [facility] Provider Note   CSN: [id number] Arrival date & time: 02/09/24  1355     Patient presents with: Abdominal Pain   Monica Watson is a 68 y.o. female.   HPI 68 year old female presents with a chief complaint of abdominal pain.  For the past 3 days she has been having intermittent right sided abdominal pain.  She has a history of prior stroke, hypertension, type 2 diabetes and smoking.  She has been having on and off pain that feels like a stomachache.  Nothing specific makes it comes and go.  She had vomiting a couple days ago and some spitting up yesterday but none today.  Has developed a few episodes of diarrhea today.  She has chronic back pain but no new or worse back pain.  No shortness of breath or chest pain.  Pain is currently not present. No fevers.  Prior to Admission medications   Medication Sig Start Date End Date Taking? Authorizing Provider  ondansetron  (ZOFRAN -ODT) 4 MG disintegrating tablet Take 1 tablet (4 mg total) by mouth every 8 (eight) hours as needed for nausea or vomiting. 02/09/24  Yes Freddi Hamilton, MD  oxyCODONE -acetaminophen  (PERCOCET) 5-325 MG tablet Take 1-2 tablets by mouth every 6 (six) hours as needed. To be taken after surgery 09/17/23   Jule Ronal CROME, PA-C  aspirin  (ASPIRIN  81) 81 MG chewable tablet Chew 1 tablet (81 mg total) by mouth 2 (two) times daily. To be taken after surgery to prevent blood clots 09/17/23   Jule Ronal CROME, PA-C  atorvastatin  (LIPITOR) 40 MG tablet Take 40 mg by mouth daily. 04/22/23   [provider]  Azelastine -Fluticasone  137-50 MCG/ACT SUSP Place 1 spray into the nose 2 (two) times daily. Patient taking differently: Place 1 spray into the nose 2 (two) times daily as needed (allergies). 08/27/23   Theophilus Pagan, MD  calcium -vitamin D (OSCAL WITH D) 500-5 MG-MCG tablet Take 1 tablet by mouth daily with breakfast. Patient not taking: Reported on 09/16/2023 07/12/22    Espinoza, Alejandra, DO  chlorhexidine  (HIBICLENS ) 4 % external liquid Apply 15 mLs (1 Application total) topically as directed for 30 doses. Use as directed daily for 5 days every other week for 6 weeks. 09/30/23   Jerri Kay HERO, MD  ciclopirox  (PENLAC ) 8 % solution Apply topically at bedtime. Apply over nail and surrounding skin. Apply daily over previous coat. After seven (7) days, may remove with alcohol and continue cycle. 09/25/23   Tobie Franky SQUIBB, DPM  clonazePAM  (KLONOPIN ) 0.25 MG disintegrating tablet Take 0.25 mg by mouth 2 (two) times daily as needed (restless legs). 04/08/22   [provider]  docusate sodium  (COLACE) 100 MG capsule Take 1 capsule (100 mg total) by mouth daily as needed. 09/17/23 09/16/24  Jule Ronal CROME, PA-C  doxycycline  (VIBRAMYCIN ) 100 MG capsule Take 1 capsule (100 mg total) by mouth 2 (two) times daily. To be taken after surgery 09/17/23   Jule Ronal CROME, PA-C  gabapentin  (NEURONTIN ) 800 MG tablet Take 1 tablet (800 mg total) by mouth at bedtime. 12/17/20   Vernetta Lonni GRADE, MD  losartan  (COZAAR ) 50 MG tablet TAKE 1 TABLET (50 MG TOTAL) BY MOUTH DAILY. 10/31/23   Theophilus Pagan, MD  metFORMIN  (GLUCOPHAGE -XR) 500 MG 24 hr tablet Take one tablet daily for a week. If you do well with that, increase to one tablet in the morning and one in the evening. Patient taking differently: Take 500 mg by mouth daily as  needed (high blood sugar). 03/20/22   Espinoza, Alejandra, DO  methocarbamol  (ROBAXIN ) 750 MG tablet TAKE 1 TABLET (750 MG TOTAL) BY MOUTH 3 (THREE) TIMES DAILY AS NEEDED FOR MUSCLE SPASMS. 10/31/23   Jule Ronal CROME, PA-C  montelukast  (SINGULAIR ) 10 MG tablet TAKE 1 TABLET (10 MG TOTAL) BY MOUTH AT BEDTIME. 01/27/24   Theophilus Pagan, MD  omeprazole  (PRILOSEC) 20 MG capsule TAKE ONE CAPSULE BY MOUTH ONCE DAILY FOR ACID REFLUX Patient taking differently: Take 20 mg by mouth daily as needed (acid reflux). 04/08/23   Federico Rosario BROCKS, MD  polyethylene glycol  (MIRALAX ) 17 g packet Take 17 g by mouth daily. Patient taking differently: Take 17 g by mouth daily as needed for moderate constipation. 10/21/20   Cresenzo, John V, MD  psyllium (METAMUCIL) 28 % packet Take 1 packet by mouth 2 (two) times daily. Patient taking differently: Take 1 packet by mouth daily as needed (constipation). 01/11/22   Malvina Ellen, MD  trolamine salicylate (ASPERCREME) 10 % cream Apply 1 Application topically as needed for muscle pain.    [provider]  ferrous sulfate  (FEROSUL) 325 (65 FE) MG tablet Take 1 tablet (325 mg total) by mouth daily. Patient not taking: No sig reported 06/09/20 12/16/20  Espinoza, Alejandra, DO    Allergies: Patient has no known allergies.    Review of Systems  Constitutional:  Negative for fever.  Respiratory:  Negative for shortness of breath.   Cardiovascular:  Negative for chest pain.  Gastrointestinal:  Positive for abdominal pain, diarrhea and vomiting.  Genitourinary:  Negative for dysuria.  Musculoskeletal:  Positive for back pain (chronic, unchanged).    Updated Vital Signs BP (!) 140/92   Pulse 72   Temp 98.3 F (36.8 C) (Oral)   Resp 13   Ht 5' 5 (1.651 m)   Wt 84 kg   SpO2 100%   BMI 30.82 kg/m   Physical Exam Vitals and nursing note reviewed.  Constitutional:      General: She is not in acute distress.    Appearance: She is well-developed. She is not ill-appearing or diaphoretic.  HENT:     Head: Normocephalic and atraumatic.  Cardiovascular:     Rate and Rhythm: Normal rate and regular rhythm.     Heart sounds: Normal heart sounds.  Pulmonary:     Effort: Pulmonary effort is normal.     Breath sounds: Normal breath sounds.  Abdominal:     Palpations: Abdomen is soft.     Tenderness: There is abdominal tenderness in the right upper quadrant and right lower quadrant.  Skin:    General: Skin is warm and dry.  Neurological:     Mental Status: She is alert.     (all labs ordered are listed,  but only abnormal results are displayed) Labs Reviewed  COMPREHENSIVE METABOLIC PANEL WITH GFR - Abnormal; Notable for the following components:      Result Value   Sodium 133 (*)    CO2 21 (*)    Glucose, Bld 161 (*)    BUN 25 (*)    ALT 47 (*)    Total Bilirubin 1.9 (*)    All other components within normal limits  CBC - Abnormal; Notable for the following components:   RBC 5.23 (*)    Hemoglobin 16.3 (*)    HCT 47.8 (*)    All other components within normal limits  URINALYSIS, ROUTINE W REFLEX MICROSCOPIC - Abnormal; Notable for the following components:   Color,  Urine AMBER (*)    APPearance CLOUDY (*)    Hgb urine dipstick SMALL (*)    Protein, ur 30 (*)    Leukocytes,Ua SMALL (*)    Bacteria, UA MANY (*)    All other components within normal limits  LIPASE, BLOOD  TROPONIN I (HIGH SENSITIVITY)    EKG: EKG Interpretation Date/Time:  Sunday February 09 2024 14:16:38 EDT Ventricular Rate:  90 PR Interval:  124 QRS Duration:  82 QT Interval:  322 QTC Calculation: 393 R Axis:   74  Text Interpretation: Sinus rhythm with Premature supraventricular complexes Nonspecific ST abnormality Confirmed by Freddi Hamilton [phone]) on 02/09/2024 3:02:13 PM  Radiology: CT ABDOMEN PELVIS W CONTRAST Result Date: 02/09/2024 CLINICAL DATA:  Right lower quadrant abdominal pain EXAM: CT ABDOMEN AND PELVIS WITH CONTRAST TECHNIQUE: Multidetector CT imaging of the abdomen and pelvis was performed using the standard protocol following bolus administration of intravenous contrast. RADIATION DOSE REDUCTION: This exam was performed according to the departmental dose-optimization program which includes automated exposure control, adjustment of the mA and/or kV according to patient size and/or use of iterative reconstruction technique. CONTRAST:  75mL OMNIPAQUE  IOHEXOL  350 MG/ML SOLN COMPARISON:  07/19/2023 FINDINGS: Lower chest: No acute pleural or parenchymal lung disease. 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 Otherwise unremarkable appearance of the liver. Status post cholecystectomy. No biliary dilatation. Pancreas: Unremarkable. No pancreatic ductal dilatation or surrounding inflammatory changes. Spleen: Normal in size without focal abnormality. Adrenals/Urinary Tract: Adrenal glands are unremarkable. Kidneys are normal, without renal calculi, focal lesion, or hydronephrosis. Limited evaluation of the bladder due to under distension and streak artifact from left hip arthroplasty. Stomach/Bowel: No bowel obstruction or ileus. Normal appendix right lower quadrant. There is wall thickening of the transverse and descending colon, nonspecific given nondistended state. Colitis cannot be excluded. I do not see any acute mesenteric inflammatory changes however. Vascular/Lymphatic: Aortic atherosclerosis. Prominent gonadal veins unchanged since prior study. No enlarged abdominal or pelvic lymph nodes. Reproductive: Stable enlarged right adnexa, previously characterized by MRI. The uterus and left adnexa are unremarkable. Other: No free fluid or free intraperitoneal gas. No abdominal wall hernia. Musculoskeletal: Unremarkable left hip arthroplasty. No acute or destructive bony abnormalities. Reconstructed images demonstrate no additional findings. IMPRESSION: 1. Nonspecific wall thickening of the transverse and descending colon given decompressed state. Colitis cannot be excluded. 2. Normal appendix. 3. Stable chronic incidental findings as above. 4.  Aortic Atherosclerosis (ICD10-I70.0). Electronically Signed   By: Ozell Daring M.D.   On: 02/09/2024 16:51     Procedures   Medications Ordered in the ED  sodium chloride  0.9 % bolus 1,000 mL (0 mLs Intravenous Stopped 02/09/24 1742)  oxyCODONE -acetaminophen  (PERCOCET/ROXICET) 5-325 MG per tablet 1 tablet (1 tablet Oral Given 02/09/24 1521)  oxyCODONE  (Oxy IR/ROXICODONE ) immediate release tablet 5 mg (5 mg Oral Given 02/09/24 1521)   iohexol  (OMNIPAQUE ) 350 MG/ML injection 75 mL (75 mLs Intravenous Contrast Given 02/09/24 1606)                                    Medical Decision Making Amount and/or Complexity of Data Reviewed Labs: ordered.    Details: Normal WBC.  Normal troponin. Radiology: ordered and independent interpretation performed.    Details: No appendicitis ECG/medicine tests: ordered and independent interpretation performed.    Details: Nonspecific changes.  Risk Prescription drug management.   Patient presents with abdominal pain and some GI complaints.  Currently feeling better after some treatment in the ED including fluids.  CT does not show any appendicitis.  Does show questionable colitis but could just be underdistention, I think it is the latter as she is not having any left-sided symptoms which would go along with the descending colon.  Otherwise labs are reassuring.  I did send a troponin as an ECG had been done prior to my arrival and showed nonspecific looking ST segments but they are not elevated or depressed.  She specifically denies dyspnea or chest pain and has reproducible abdominal pain so I think a cardiac cause is unlikely.  Otherwise, patient may have a viral illness, recommend following up with PCP if not improving and will give return precautions.  Is stable for discharge.  Is no urinary symptoms and there are squamous epithelial cells on her urinalysis so I suspect this is not a UTI.     Final diagnoses:  Right sided abdominal pain    ED Discharge Orders          Ordered    ondansetron  (ZOFRAN -ODT) 4 MG disintegrating tablet  Every 8 hours PRN        02/09/24 1854               Freddi Hamilton, MD 02/09/24 1857

## 2024-02-09 NOTE — Discharge Instructions (Addendum)
 It is unclear what is causing your abdominal symptoms.  You need to follow-up with your primary care provider if you are not improving in the next 48 hours.  Otherwise, take Tylenol  for pain, drink fluids, and slowly advance your diet.  If you develop worsening, continued, or recurrent abdominal pain, uncontrolled vomiting, fever, chest or back pain, or any other new/concerning symptoms then return to the ER for evaluation.

## 2024-02-09 NOTE — ED Notes (Signed)
Pt ambulated to restroom to provide urine sample

## 2024-02-09 NOTE — ED Triage Notes (Signed)
 Pt c/o nausea, diarrhea and abdominal pain for past two days.

## 2024-02-10 DIAGNOSIS — I10 Essential (primary) hypertension: Secondary | ICD-10-CM | POA: Diagnosis not present

## 2024-02-10 DIAGNOSIS — Z79899 Other long term (current) drug therapy: Secondary | ICD-10-CM | POA: Diagnosis not present

## 2024-02-10 DIAGNOSIS — F17210 Nicotine dependence, cigarettes, uncomplicated: Secondary | ICD-10-CM | POA: Diagnosis not present

## 2024-02-10 DIAGNOSIS — G8929 Other chronic pain: Secondary | ICD-10-CM | POA: Diagnosis not present

## 2024-02-10 DIAGNOSIS — Z7409 Other reduced mobility: Secondary | ICD-10-CM | POA: Diagnosis not present

## 2024-02-10 DIAGNOSIS — M25511 Pain in right shoulder: Secondary | ICD-10-CM | POA: Diagnosis not present

## 2024-02-10 DIAGNOSIS — M5416 Radiculopathy, lumbar region: Secondary | ICD-10-CM | POA: Diagnosis not present

## 2024-02-17 DIAGNOSIS — Z79899 Other long term (current) drug therapy: Secondary | ICD-10-CM | POA: Diagnosis not present

## 2024-02-18 DIAGNOSIS — M25511 Pain in right shoulder: Secondary | ICD-10-CM | POA: Diagnosis not present

## 2024-02-24 DIAGNOSIS — Z7409 Other reduced mobility: Secondary | ICD-10-CM | POA: Diagnosis not present

## 2024-02-27 ENCOUNTER — Other Ambulatory Visit: Payer: Self-pay | Admitting: Family Medicine

## 2024-02-27 DIAGNOSIS — R0981 Nasal congestion: Secondary | ICD-10-CM

## 2024-03-04 DIAGNOSIS — M25511 Pain in right shoulder: Secondary | ICD-10-CM | POA: Diagnosis not present

## 2024-03-11 DIAGNOSIS — G8929 Other chronic pain: Secondary | ICD-10-CM | POA: Diagnosis not present

## 2024-03-11 DIAGNOSIS — Z7409 Other reduced mobility: Secondary | ICD-10-CM | POA: Diagnosis not present

## 2024-03-11 DIAGNOSIS — M25511 Pain in right shoulder: Secondary | ICD-10-CM | POA: Diagnosis not present

## 2024-03-11 DIAGNOSIS — M549 Dorsalgia, unspecified: Secondary | ICD-10-CM | POA: Diagnosis not present

## 2024-03-11 DIAGNOSIS — F17210 Nicotine dependence, cigarettes, uncomplicated: Secondary | ICD-10-CM | POA: Diagnosis not present

## 2024-03-11 DIAGNOSIS — I10 Essential (primary) hypertension: Secondary | ICD-10-CM | POA: Diagnosis not present

## 2024-03-11 DIAGNOSIS — M5416 Radiculopathy, lumbar region: Secondary | ICD-10-CM | POA: Diagnosis not present

## 2024-03-11 DIAGNOSIS — M79674 Pain in right toe(s): Secondary | ICD-10-CM | POA: Diagnosis not present

## 2024-04-27 ENCOUNTER — Encounter: Payer: Self-pay | Admitting: Radiology

## 2024-04-29 ENCOUNTER — Other Ambulatory Visit: Payer: Self-pay | Admitting: Family Medicine

## 2024-04-29 DIAGNOSIS — R0981 Nasal congestion: Secondary | ICD-10-CM

## 2024-04-30 ENCOUNTER — Encounter: Admitting: Sports Medicine

## 2024-05-07 ENCOUNTER — Ambulatory Visit: Payer: 59

## 2024-05-07 ENCOUNTER — Telehealth: Payer: Self-pay

## 2024-05-07 VITALS — Ht 69.0 in | Wt 185.2 lb

## 2024-05-07 DIAGNOSIS — Z59869 Financial insecurity, unspecified: Secondary | ICD-10-CM

## 2024-05-07 DIAGNOSIS — Z0000 Encounter for general adult medical examination without abnormal findings: Secondary | ICD-10-CM | POA: Diagnosis not present

## 2024-05-07 NOTE — Patient Instructions (Signed)
 Monica Watson,  Thank you for taking the time for your Medicare Wellness Visit. I appreciate your continued commitment to your health goals. Please review the care plan we discussed, and feel free to reach out if I can assist you further.  Please note that Annual Wellness Visits do not include a physical exam. Some assessments may be limited, especially if the visit was conducted virtually. If needed, we may recommend an in-person follow-up with your provider.  Ongoing Care Seeing your primary care provider every 3 to 6 months helps us  monitor your health and provide consistent, personalized care.   Referrals If a referral was made during today's visit and you haven't received any updates within two weeks, please contact the referred provider directly to check on the status.  Recommended Screenings:  Health Maintenance  Topic Date Due   Zoster (Shingles) Vaccine (1 of 2) Never done   Eye exam for diabetics  11/09/2022   Flu Shot  01/24/2024   Complete foot exam   01/24/2024   COVID-19 Vaccine (4 - 2025-26 season) 02/24/2024   Hemoglobin A1C  03/31/2024   Medicare Annual Wellness Visit  05/05/2024   Breast Cancer Screening  03/13/2024   Yearly kidney health urinalysis for diabetes  09/01/2024   Screening for Lung Cancer  09/23/2024   Yearly kidney function blood test for diabetes  02/08/2025   Colon Cancer Screening  12/24/2027   DTaP/Tdap/Td vaccine (2 - Td or Tdap) 11/24/2030   Pneumococcal Vaccine for age over 31  Completed   DEXA scan (bone density measurement)  Completed   Hepatitis C Screening  Completed   Meningitis B Vaccine  Aged Out       05/07/2024    9:17 AM  Advanced Directives  Does Patient Have a Medical Advance Directive? Yes  Type of Estate Agent of [place];Living will  Does patient want to make changes to medical advance directive? No - Patient declined  Copy of Healthcare Power of Attorney in Chart? No - copy requested    Vision:  Annual vision screenings are recommended for early detection of glaucoma, cataracts, and diabetic retinopathy. These exams can also reveal signs of chronic conditions such as diabetes and high blood pressure.  Dental: Annual dental screenings help detect early signs of oral cancer, gum disease, and other conditions linked to overall health, including heart disease and diabetes.  Please see the attached documents for additional preventive care recommendations.

## 2024-05-07 NOTE — Progress Notes (Signed)
 I connected with  Monica Watson on 05/07/24 by a video and audio enabled telemedicine application and verified that I am speaking with the correct person using two identifiers.  Patient Location: Home  Provider Location: Home Office  Persons Participating in Visit: Patient.  I discussed the limitations of evaluation and management by telemedicine. The patient expressed understanding and agreed to proceed.   Vital Signs: Because this visit was a virtual/telehealth visit, some criteria may be missing or patient reported. Any vitals not documented were not able to be obtained and vitals that have been documented are patient reported.   If you're able to add these things as option in the [employer], we won't need it ... but for those who refuse to use the template, I guess it does need to be updated    Chief Complaint  Patient presents with   Medicare Wellness    SUBSEQUENT     Subjective:   Monica Watson is a 68 y.o. female who presents for a Medicare Annual Wellness Visit.  Allergies (verified) Patient has no known allergies.   History: Past Medical History:  Diagnosis Date   Arthritis    Cerebrovascular disease    Depression    GERD (gastroesophageal reflux disease)    Headache    Migraines   History of pneumonia    Hypertension    Stroke [facility]) 2010   Left sided weakness   Type 2 diabetes mellitus (HCC)    Past Surgical History:  Procedure Laterality Date   bilateral knee replacements      CATARACT EXTRACTION, BILATERAL     FRACTURE SURGERY Right    arm as a child   gallstones removed     I & D KNEE WITH POLY EXCHANGE Right 12/16/2020   Procedure: POLY EXCHANGE RIGHT KNEE;  Surgeon: Vernetta Lonni GRADE, MD;  Location: [facility] ORS;  Service: Orthopedics;  Laterality: Right;   KNEE ARTHROSCOPY Bilateral    left carpal tunnel release      PAROTIDECTOMY Left 02/20/2023   Procedure: SUPERFICIAL PAROTIDECTOMY WITH FACIAL NERVE MONITORING;   Surgeon: Luciano Standing, MD;  Location: [facility] OR;  Service: ENT;  Laterality: Left;   REVERSE SHOULDER ARTHROPLASTY Left 04/23/2022   Procedure: LEFT REVERSE SHOULDER ARTHROPLASTY;  Surgeon: Genelle Standing, MD;  Location: [facility] OR;  Service: Orthopedics;  Laterality: Left;   SHOULDER ARTHROSCOPY Bilateral    THYROIDECTOMY     TOTAL HIP ARTHROPLASTY Left 09/30/2023   Procedure: LEFT TOTAL HIP ARTHROPLASTY ANTERIOR APPROACH;  Surgeon: Jerri Kay HERO, MD;  Location: [facility] OR;  Service: Orthopedics;  Laterality: Left;  3-C   Family History  Problem Relation Age of Onset   Colon cancer Neg Hx    Esophageal cancer Neg Hx    Stomach cancer Neg Hx    Social History   Occupational History   Occupation: disability  Tobacco Use   Smoking status: Every Day    Current packs/day: 0.50    Average packs/day: 0.5 packs/day for 46.0 years (23.0 ttl pk-yrs)    Types: Cigarettes    Passive exposure: Current   Smokeless tobacco: Never  Vaping Use   Vaping status: Never Used  Substance and Sexual Activity   Alcohol use: Never   Drug use: Never   Sexual activity: Yes   Tobacco Counseling Ready to quit: Yes Counseling given: Yes  SDOH Screenings   Food Insecurity: No Food Insecurity (05/07/2024)  Housing: High Risk (05/07/2024)  Transportation Needs: Unmet Transportation Needs (05/07/2024)  Utilities: Not  At Risk (05/07/2024)  Alcohol Screen: Low Risk  (05/06/2023)  Depression (PHQ2-9): Low Risk  (05/07/2024)  Financial Resource Strain: Low Risk  (05/06/2023)  Physical Activity: Inactive (05/07/2024)  Social Connections: Socially Isolated (05/07/2024)  Stress: Stress Concern Present (05/07/2024)  Tobacco Use: High Risk (05/07/2024)  Health Literacy: Adequate Health Literacy (05/06/2023)   See flowsheets for full screening details  Depression Screen PHQ 2 & 9 Depression Scale- Over the past 2 weeks, how often have you been bothered by any of the following problems? Little interest or pleasure in  doing things: 1 Feeling down, depressed, or hopeless (PHQ Adolescent also includes...irritable): 1 (LOSS OF SON & DAUGHTER) PHQ-2 Total Score: 2 Trouble falling or staying asleep, or sleeping too much: 0 Feeling tired or having little energy: 0 Poor appetite or overeating (PHQ Adolescent also includes...weight loss): 0 Feeling bad about yourself - or that you are a failure or have let yourself or your family down: 0 Trouble concentrating on things, such as reading the newspaper or watching television (PHQ Adolescent also includes...like school work): 1 Moving or speaking so slowly that other people could have noticed. Or the opposite - being so fidgety or restless that you have been moving around a lot more than usual: 0 Thoughts that you would be better off dead, or of hurting yourself in some way: 0 PHQ-9 Total Score: 3 If you checked off any problems, how difficult have these problems made it for you to do your work, take care of things at home, or get along with other people?: Not difficult at all  Depression Treatment Depression Interventions/Treatment : EYV7-0 Score <4 Follow-up Not Indicated     Goals Addressed             This Visit's Progress    05/07/2024: To find a new home and take better care of my health.         Visit info / Clinical Intake: Medicare Wellness Visit Type:: Subsequent Annual Wellness Visit Persons participating in visit:: patient Medicare Wellness Visit Mode:: Video Because this visit was a virtual/telehealth visit:: pt reported vitals If Telephone or Video please confirm:: I connected with the patient using audio enabled telemedicine application and verified that I am speaking with the correct person using two identifiers; I discussed the limitations of evaluation and management by telemedicine; The patient expressed understanding and agreed to proceed Patient Location:: HOME Provider Location:: HOME Information given by:: patient Interpreter  Needed?: No Pre-visit prep was completed: yes AWV questionnaire completed by patient prior to visit?: no Living arrangements:: (!) lives alone Patient's Overall Health Status Rating: good Typical amount of pain: some Does pain affect daily life?: (!) yes Are you currently prescribed opioids?: (!) yes  Dietary Habits and Nutritional Risks How many meals a day?: 2 Eats fruit and vegetables daily?: yes (FRUITS CAUSES GAS) Most meals are obtained by: preparing own meals In the last 2 weeks, have you had any of the following?: none Diabetic:: (!) yes Any non-healing wounds?: no How often do you check your BS?: 0 Would you like to be referred to a Nutritionist or for Diabetic Management? : no  Functional Status Activities of Daily Living (to include ambulation/medication): (!) Needs Assist Feeding: Independent Dressing/Grooming: Independent Bathing: Independent Toileting: Independent Transfer: Independent Ambulation: Independent with device- listed below Home Assistive Devices/Equipment: Cane Medication Administration: Independent Home Management: Independent Manage your own finances?: yes Primary transportation is: facility / other [facility]) Concerns about vision?: (!) yes (C/O BLURRED VISION) Concerns about hearing?: no  Fall Screening Falls in the past year?: 0 Number of falls in past year: 0 Was there an injury with Fall?: 0 Fall Risk Category Calculator: 0 Patient Fall Risk Level: Low Fall Risk  Fall Risk Patient at Risk for Falls Due to: No Fall Risks Fall risk Follow up: Falls evaluation completed; Education provided  Home and Transportation Safety: All rugs have non-skid backing?: (!) no All stairs or steps have railings?: yes (14 STEPS, RAIL ON ONE SIDE) Grab bars in the bathtub or shower?: yes Have non-skid surface in bathtub or shower?: yes Good home lighting?: yes Regular seat belt use?: yes Hospital stays in the last year:: (!) yes How many hospital stays::  3 Reason: HIP SURGERY  Cognitive Assessment Difficulty concentrating, remembering, or making decisions? : yes (WRITES THINGS DOWN) Will 6CIT or Mini Cog be Completed: yes Which version was used?: Version1 : banana, sunrise, chair; Version 4 : river, nation, finger; Version 5 : captain, garden, picture  [employer] (For Healthcare) Does Patient Have a Medical Advance Directive?: Yes Does patient want to make changes to medical advance directive?: No - Patient declined Type of Advance Directive: Healthcare Power of [place]; Living will Copy of Healthcare Power of Attorney in Chart?: No - copy requested Copy of Living Will in Chart?: No - copy requested Would patient like information on creating a medical advance directive?: Yes (MAU/Ambulatory/Procedural Areas - Information given)  Reviewed/Updated  Reviewed/Updated: Reviewed All (Medical, Surgical, Family, Medications, Allergies, Care Teams, Patient Goals)        Objective:    Today's Vitals   05/07/24 0913  Weight: 185 lb 3 oz (84 kg)  Height: 5' 9 (1.753 m)  PainSc: 7   PainLoc: Knee   Body mass index is 27.35 kg/m.  Current Medications (verified) Outpatient Encounter Medications as of 05/07/2024  Medication Sig   oxyCODONE -acetaminophen  (PERCOCET) 5-325 MG tablet Take 1-2 tablets by mouth every 6 (six) hours as needed. To be taken after surgery   aspirin  (ASPIRIN  81) 81 MG chewable tablet Chew 1 tablet (81 mg total) by mouth 2 (two) times daily. To be taken after surgery to prevent blood clots   atorvastatin  (LIPITOR) 40 MG tablet Take 40 mg by mouth daily.   Azelastine -Fluticasone  137-50 MCG/ACT SUSP Place 1 spray into the nose 2 (two) times daily. (Patient taking differently: Place 1 spray into the nose 2 (two) times daily as needed (allergies).)   calcium -vitamin D (OSCAL WITH D) 500-5 MG-MCG tablet Take 1 tablet by mouth daily with breakfast. (Patient not taking: Reported on 09/16/2023)   chlorhexidine   (HIBICLENS ) 4 % external liquid Apply 15 mLs (1 Application total) topically as directed for 30 doses. Use as directed daily for 5 days every other week for 6 weeks.   ciclopirox  (PENLAC ) 8 % solution Apply topically at bedtime. Apply over nail and surrounding skin. Apply daily over previous coat. After seven (7) days, may remove with alcohol and continue cycle.   clonazePAM  (KLONOPIN ) 0.25 MG disintegrating tablet Take 0.25 mg by mouth 2 (two) times daily as needed (restless legs).   docusate sodium  (COLACE) 100 MG capsule Take 1 capsule (100 mg total) by mouth daily as needed.   doxycycline  (VIBRAMYCIN ) 100 MG capsule Take 1 capsule (100 mg total) by mouth 2 (two) times daily. To be taken after surgery   gabapentin  (NEURONTIN ) 800 MG tablet Take 1 tablet (800 mg total) by mouth at bedtime.   losartan  (COZAAR ) 50 MG tablet TAKE 1 TABLET (50 MG TOTAL) BY MOUTH  DAILY.   metFORMIN  (GLUCOPHAGE -XR) 500 MG 24 hr tablet Take one tablet daily for a week. If you do well with that, increase to one tablet in the morning and one in the evening. (Patient taking differently: Take 500 mg by mouth daily as needed (high blood sugar).)   methocarbamol  (ROBAXIN ) 750 MG tablet TAKE 1 TABLET (750 MG TOTAL) BY MOUTH 3 (THREE) TIMES DAILY AS NEEDED FOR MUSCLE SPASMS.   montelukast  (SINGULAIR ) 10 MG tablet TAKE 1 TABLET (10 MG TOTAL) BY MOUTH AT BEDTIME.   omeprazole  (PRILOSEC) 20 MG capsule TAKE ONE CAPSULE BY MOUTH ONCE DAILY FOR ACID REFLUX (Patient taking differently: Take 20 mg by mouth daily as needed (acid reflux).)   ondansetron  (ZOFRAN -ODT) 4 MG disintegrating tablet Take 1 tablet (4 mg total) by mouth every 8 (eight) hours as needed for nausea or vomiting.   polyethylene glycol (MIRALAX ) 17 g packet Take 17 g by mouth daily. (Patient taking differently: Take 17 g by mouth daily as needed for moderate constipation.)   psyllium (METAMUCIL) 28 % packet Take 1 packet by mouth 2 (two) times daily. (Patient taking  differently: Take 1 packet by mouth daily as needed (constipation).)   trolamine salicylate (ASPERCREME) 10 % cream Apply 1 Application topically as needed for muscle pain.   [DISCONTINUED] ferrous sulfate  (FEROSUL) 325 (65 FE) MG tablet Take 1 tablet (325 mg total) by mouth daily. (Patient not taking: No sig reported)   No facility-administered encounter medications on file as of 05/07/2024.   Hearing/Vision screen Hearing Screening - Comments:: Patient denied any hearing difficulties.  Vision Screening - Comments:: Patient wears rx glasses - not up to date with routine eye exams with Oneil Platts, MD (retired). Patient requesting referral to Ophthalmologist.  Immunizations and Health Maintenance Health Maintenance  Topic Date Due   Zoster Vaccines- Shingrix  (1 of 2) Never done   OPHTHALMOLOGY EXAM  11/09/2022   Influenza Vaccine  01/24/2024   FOOT EXAM  01/24/2024   COVID-19 Vaccine (4 - 2025-26 season) 02/24/2024   HEMOGLOBIN A1C  03/31/2024   Mammogram  03/13/2024   Diabetic kidney evaluation - Urine ACR  09/01/2024   Lung Cancer Screening  09/23/2024   Diabetic kidney evaluation - eGFR measurement  02/08/2025   Medicare Annual Wellness (AWV)  05/07/2025   Colonoscopy  12/24/2027   DTaP/Tdap/Td (2 - Td or Tdap) 11/24/2030   Pneumococcal Vaccine: 50+ Years  Completed   DEXA SCAN  Completed   Hepatitis C Screening  Completed   Meningococcal B Vaccine  Aged Out        Assessment/Plan:  This is a routine wellness examination for Jame.  Patient Care Team: Theophilus Pagan, MD as PCP - General (Family Medicine) Charlette Erla LABOR, MD as Referring Physician (Internal Medicine) Center, [facility]  I have personally reviewed and noted the following in the patient's chart:   Medical and social history Use of alcohol, tobacco or illicit drugs  Current medications and supplements including opioid prescriptions. Functional ability and status Nutritional  status Physical activity Advanced directives List of other physicians Hospitalizations, surgeries, and ER visits in previous 12 months Vitals Screenings to include cognitive, depression, and falls Referrals and appointments  No orders of the defined types were placed in this encounter.  In addition, I have reviewed and discussed with patient certain preventive protocols, quality metrics, and best practice recommendations. A written personalized care plan for preventive services as well as general preventive health recommendations were provided to patient.   Roz SAILOR  Tomie, LPN   88/86/7974   Return in about 1 year (around 05/07/2025) for Medicare wellness.  After Visit Summary: (MyChart) Due to this being a telephonic visit, the after visit summary with patients personalized plan was offered to patient via MyChart   Nurse Notes: Please see routing comments.

## 2024-05-07 NOTE — Telephone Encounter (Signed)
 This nurse completed AWV for this patient today.  Patient is having some issues with housing placement, stress and transportation.  She is in need of an office visit but does not have the funds to catch public transportation. This nurse will be glad to place any referral with your approval to [facility] or [employer].  Patient is needing the following referral to specialists: Dentist (has to accept Medicaid), Diabetic Eye Exam [facility]) and Podiatry-[facility] with Dr. Delon Merlin.  If there is a child psychotherapist that can assist this patient, please forward message.  Ronnette Rump N. Tomie, LPN [facility] Annual Wellness Team Direct Dial: [phone]

## 2024-05-13 ENCOUNTER — Telehealth: Payer: Self-pay

## 2024-05-13 NOTE — Progress Notes (Signed)
 Complex Care Management Note  Care Guide Note 05/13/2024 Name: Giorgia Wahler MRN: [id number] DOB: 04-13-1956  Minetta Krisher is a 68 y.o. year old female who sees Theophilus Pagan, MD for primary care. I reached out to Montie Skates by phone today to offer complex care management services.  Ms. Fedrick was given information about Complex Care Management services today including:   The Complex Care Management services include support from the care team which includes your Nurse Care Manager, Clinical Social Worker, or Pharmacist.  The Complex Care Management team is here to help remove barriers to the health concerns and goals most important to you. Complex Care Management services are voluntary, and the patient may decline or stop services at any time by request to their care team member.   Complex Care Management Consent Status: Patient agreed to services and verbal consent obtained.   Follow up plan:  Telephone appointment with complex care management team member scheduled for:  BSW 05/19/2024 RNCM 06/10/2024  Encounter Outcome:  Patient Scheduled  Jeoffrey Buffalo , RMA     [place]  [facility], [facility] Guide  Direct Dial: [phone]  Website: [email or web address]

## 2024-05-13 NOTE — Addendum Note (Signed)
 Addended by: Tavi Hoogendoorn on: 05/13/2024 08:50 AM   Modules accepted: Orders

## 2024-05-19 ENCOUNTER — Telehealth: Payer: Self-pay

## 2024-05-19 ENCOUNTER — Other Ambulatory Visit: Payer: Self-pay

## 2024-05-19 NOTE — Patient Instructions (Signed)
 Visit Information  Thank you for taking time to visit with me today. Please don't hesitate to contact me if I can be of assistance to you before our next scheduled appointment.  Our next appointment is by telephone on 06/02/2024 at 3pm   Please call the care guide team at [phone] if you need to cancel or reschedule your appointment.   Following is a copy of your care plan:   Goals Addressed             This Visit's Progress    BSW Goal       Current SDOH Barriers:  Transportation Housing  Interventions: Patient interviewed and appropriate screenings performed Referred patient to community resources  Discussed plans with patient for ongoing follow up and provided patient with direct contact number Advised patient to continue working with [employer] re housing.  Advised pt to call [facility] associates and [facility] to set up appointments and confirm they accept her health insurance.  BSW will message PCP MD re patients request for a prescription to help with her cough.           Please call the Suicide and Crisis Lifeline: 988 go to [facility] Urgent [facility] [address] [place] [phone]) call 911 if you are experiencing a Mental Health or Behavioral Health Crisis or need someone to talk to.  Patient verbalized understanding of Care plan and visit instructions communicated this visit  Laymon Doll, [place]/[facility] - [facility] Social Worker [phone]

## 2024-05-19 NOTE — Patient Outreach (Signed)
 Social Drivers of Health  Community Resource and Care Coordination Visit Note   05/19/2024  Name: Monica Watson MRN: [id number] DOB:06-09-1956  Situation: Referral received for [facility] needs assessment and assistance related to [employer]. I obtained verbal consent from Patient.  Visit completed with Patient on the phone.   Background:   SDOH Interventions Today    Flowsheet Row Most Recent Value  SDOH Interventions   Food Insecurity Interventions Intervention Not Indicated  [patient is ok with food right now.]  Housing Interventions Intervention Not Indicated  [patient is needing to move to a lower floor apartment/home. [employer] is providing patient with support with housing.]  Transportation Interventions Patient Resources (Friends/Family), Other (Comment)  [patient knows how to use medicaid transportation and uses it to go see her pain doctor. Pt has ran out of 48 [employer] rides.]  Utilities Interventions Intervention Not Indicated     Assessment:   Goals Addressed             This Visit's Progress    BSW Goal       Current SDOH Barriers:  Transportation Housing  Interventions: Patient interviewed and appropriate screenings performed Referred patient to community resources  Discussed plans with patient for ongoing follow up and provided patient with direct contact number Advised patient to continue working with [employer] re housing.  Advised pt to call [facility] associates and [facility] to set up appointments and confirm they accept her health insurance.  BSW will message PCP MD re patients request for a prescription to help with her cough.           Recommendation:   attend all scheduled provider appointments  Follow Up Plan:   Telephone follow up appointment date/time:  06-02-2024 at 3pm  Laymon Doll, [place]/[facility] - [facility] Social Worker [phone]

## 2024-05-25 LAB — OPHTHALMOLOGY REPORT-SCANNED

## 2024-05-27 ENCOUNTER — Other Ambulatory Visit

## 2024-05-27 ENCOUNTER — Ambulatory Visit: Admitting: Orthopaedic Surgery

## 2024-05-27 DIAGNOSIS — Z96651 Presence of right artificial knee joint: Secondary | ICD-10-CM | POA: Diagnosis not present

## 2024-05-27 NOTE — Progress Notes (Signed)
 Office Visit Note   Patient: Monica Watson           Date of Birth: September 02, 1955           MRN: [id number] Visit Date: 05/27/2024              Requested by: Theophilus Pagan, MD [address] [place],  [place] [address] PCP: Theophilus Pagan, MD   Assessment & Plan: Visit Diagnoses:  1. Status post revision of total replacement of right knee     Plan: History of Present Illness Adylin Hankey is a 68 year old female with a history of knee replacement who presents with severe right knee pain.  She has had severe pain in her previously replaced knee for almost three months, worst with stair use, and it significantly limits walking and stair climbing in her apartment building with fourteen steps.  She had a poly exchange in 2022 by Dr. Vernetta that initially improved symptoms, but the pain has recurred and now sometimes requires assistance with daily tasks. Oral pain medication has not been effective. She uses muscle relaxants at half the prescribed dose. She lives in an upstairs apartment, avoids stairs when possible, and sometimes relies on others to bring food due to knee pain.  She had a bone scan in 2021 which was negative. She denies weight gain. The primary issue today is severe knee pain, especially with stairs.  Physical Exam MUSCULOSKELETAL: Knee stable varus valgus stress.  Functional range of motion.  Surgical scar is fully healed.  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 Consider chronic pain syndrome as a differential. - Ordered blood work to rule out infection. - Ordered bone scan. - Advised on strengthening quadriceps to improve mobility and reduce pain. - Recommended avoiding stairs to minimize pain. - Discussed potential for chronic pain syndrome and its management.  Follow-Up  Instructions: No follow-ups on file.   Orders:  Orders Placed This Encounter  Procedures   XR KNEE 3 VIEW RIGHT   NM Bone Scan 3 Phase   C-reactive protein   Sed Rate (ESR)   CBC with Differential   No orders of the defined types were placed in this encounter.     Procedures: No procedures performed   Clinical Data: No additional findings.   Subjective: Chief Complaint  Patient presents with   Right Knee - Pain    HPI  Review of Systems  Constitutional: Negative.   HENT: Negative.    Eyes: Negative.   Respiratory: Negative.    Cardiovascular: Negative.   Endocrine: Negative.   Musculoskeletal: Negative.   Neurological: Negative.   Hematological: Negative.   Psychiatric/Behavioral: Negative.    All other systems reviewed and are negative.    Objective: Vital Signs: There were no vitals taken for this visit.  Physical Exam Vitals and nursing note reviewed.  Constitutional:      Appearance: She is well-developed.  HENT:     Head: Atraumatic.     Nose: Nose normal.  Eyes:     Extraocular Movements: Extraocular movements intact.  Cardiovascular:     Pulses: Normal pulses.  Pulmonary:     Effort: Pulmonary effort is normal.  Abdominal:     Palpations: Abdomen is soft.  Musculoskeletal:     Cervical back: Neck supple.  Skin:    General: Skin is warm.  Capillary Refill: Capillary refill takes less than 2 seconds.  Neurological:     Mental Status: She is alert. Mental status is at baseline.  Psychiatric:        Behavior: Behavior normal.        Thought Content: Thought content normal.        Judgment: Judgment normal.     Ortho Exam  Specialty Comments:  No specialty comments available.  Imaging: XR KNEE 3 VIEW RIGHT Result Date: 05/27/2024 Stable total knee replacement without complication.    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   Osteoarthritis 02/19/2022   Abnormal uterine bleeding (AUB) 02/08/2022   Internal hemorrhoids 07/28/2021   Cervical lymphadenopathy 03/11/2021   Polyethylene wear of right knee prosthesis, 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/15/2020   Sialadenitis 11/23/2020   Abdominal pain 10/23/2020   Bartholin cyst 07/29/2020   History of total knee replacement, left 06/13/2020   Status post revision of total replacement of right knee 06/13/2020   Chronic back pain 06/09/2020   Vision changes 12/31/2019   Bilateral knee pain 10/08/2019   Poor dentition 10/08/2019   Healthcare maintenance 10/08/2019   Chronic left shoulder pain 08/31/2019   Hemorrhoids 04/04/2019   Pre-diabetes 01/01/2019   Hot flashes 01/01/2019   Onychomycosis 01/01/2019   Past Medical History:  Diagnosis Date   Arthritis    Cerebrovascular disease    Depression    GERD (gastroesophageal reflux disease)    Headache    Migraines   History of pneumonia    Hypertension    Stroke (HCC) 2010   Left sided weakness   Type 2 diabetes mellitus (HCC)     Family History  Problem Relation Age of Onset   Colon cancer Neg Hx    Esophageal cancer Neg Hx    Stomach cancer Neg Hx     Past Surgical History:  Procedure Laterality Date   bilateral knee replacements      CATARACT EXTRACTION, BILATERAL     FRACTURE SURGERY Right    arm as a child   gallstones removed     I & D KNEE WITH POLY EXCHANGE Right 12/16/2020   Procedure: POLY EXCHANGE RIGHT KNEE;  Surgeon: Vernetta Lonni GRADE, MD;  Location: [facility];  Service: Orthopedics;  Laterality: Right;   KNEE ARTHROSCOPY Bilateral    left carpal tunnel  release      PAROTIDECTOMY Left 02/20/2023   Procedure: SUPERFICIAL PAROTIDECTOMY WITH FACIAL NERVE MONITORING;  Surgeon: Luciano Standing, MD;  Location: [facility];  Service: ENT;  Laterality: Left;   REVERSE SHOULDER ARTHROPLASTY Left 04/23/2022   Procedure: LEFT REVERSE SHOULDER ARTHROPLASTY;  Surgeon: Genelle Standing, MD;  Location: [facility] OR;  Service: Orthopedics;  Laterality: Left;   SHOULDER ARTHROSCOPY Bilateral    THYROIDECTOMY     TOTAL HIP ARTHROPLASTY Left 09/30/2023   Procedure: LEFT TOTAL HIP ARTHROPLASTY ANTERIOR APPROACH;  Surgeon: Jerri Kay HERO, MD;  Location: [facility] OR;  Service: Orthopedics;  Laterality: Left;  3-C   Social History   Occupational History   Occupation: disability  Tobacco Use   Smoking status: Every Day  Current packs/day: 0.50    Average packs/day: 0.5 packs/day for 46.0 years (23.0 ttl pk-yrs)    Types: Cigarettes    Passive exposure: Current   Smokeless tobacco: Never  Vaping Use   Vaping status: Never Used  Substance and Sexual Activity   Alcohol use: Never   Drug use: Never   Sexual activity: Yes

## 2024-05-28 ENCOUNTER — Ambulatory Visit: Payer: Self-pay | Admitting: Orthopaedic Surgery

## 2024-05-28 LAB — CBC WITH DIFFERENTIAL/PLATELET
Absolute Lymphocytes: 1868 {cells}/uL (ref 850–3900)
Absolute Monocytes: 284 {cells}/uL (ref 200–950)
Basophils Absolute: 20 {cells}/uL (ref 0–200)
Basophils Relative: 0.5 %
Eosinophils Absolute: 100 {cells}/uL (ref 15–500)
Eosinophils Relative: 2.5 %
HCT: 42.4 % (ref 35.9–46.0)
Hemoglobin: 14.1 g/dL (ref 11.7–15.5)
MCH: 32.3 pg (ref 27.0–33.0)
MCHC: 33.3 g/dL (ref 31.6–35.4)
MCV: 97.2 fL (ref 81.4–101.7)
MPV: 10.4 fL (ref 7.5–12.5)
Monocytes Relative: 7.1 %
Neutro Abs: 1728 {cells}/uL (ref 1500–7800)
Neutrophils Relative %: 43.2 %
Platelets: 245 Thousand/uL (ref 140–400)
RBC: 4.36 Million/uL (ref 3.80–5.10)
RDW: 11.9 % (ref 11.0–15.0)
Total Lymphocyte: 46.7 %
WBC: 4 Thousand/uL (ref 3.8–10.8)

## 2024-05-28 LAB — C-REACTIVE PROTEIN: CRP: <3 mg/L (ref <8.0)

## 2024-05-28 LAB — SEDIMENTATION RATE: Sed Rate: 6 mm/h (ref 0–30)

## 2024-06-02 ENCOUNTER — Other Ambulatory Visit: Payer: Self-pay

## 2024-06-02 NOTE — Patient Instructions (Signed)
 Visit Information  Thank you for taking time to visit with me today. Please don't hesitate to contact me if I can be of assistance to you before our next scheduled appointment.  Your next care management appointment is by telephone on 06/16/2024 at 3pm  Telephone follow up appointment date/time:  06/16/2024 at 3pm.  Please call the care guide team at [phone] if you need to cancel, schedule, or reschedule an appointment.   Please call the Suicide and Crisis Lifeline: 988 go to [facility] Urgent [facility] [address], [place] [phone]) call 911 if you are experiencing a Mental Health or Behavioral Health Crisis or need someone to talk to.  Laymon Doll, BSW [place]/VBCI - [employer] Social Worker [phone]

## 2024-06-02 NOTE — Patient Outreach (Signed)
 Social Drivers of Health  Community Resource and Care Coordination Visit Note   06/02/2024  Name: Monica Watson MRN: [id number] DOB:1956/06/01  Situation: Referral received for [facility] needs assessment and assistance related to [employer] . I obtained verbal consent from Patient.  Visit completed with Patient on the phone.   Background:   SDOH Interventions Today    Flowsheet Row Most Recent Value  SDOH Interventions   Food Insecurity Interventions Intervention Not Indicated  [patient is ok with food.]  Housing Interventions --  [patient is needing to move to a lower floor apartment/home. [facility] is providing patient with support with housing.  Pt has found another place but needs assistance with rental deposit.]  Transportation Interventions Patient Resources (Friends/Family)  [Transportation and mobility services eligibility form was completed and sent to DSS for processing.]  Utilities Interventions Intervention Not Indicated     Assessment:   Goals Addressed             This Visit's Progress    BSW Goal       Current SDOH Barriers:  Transportation Housing  Interventions: Patient interviewed and appropriate screenings performed Referred patient to community resources  Discussed plans with patient for ongoing follow up and provided patient with direct contact number Advised patient to continue working with [facility] re housing.  Advised pt to call [facility] associates and [facility] to set up appointments and confirm they accept her health insurance.  BSW will message PCP MD re patients request for a prescription to help with her cough.   06/02/2024 Patient is looking for resources to help with rental deposit. BSW completed Transportation and Mobility Eligibility form for patient for transportation services through DSS. Patient was provided with phone to f/u.         Recommendation:   attend all scheduled provider  appointments ask for help if you don't understand your health insurance benefits F/u with DSS regarding Transportation and Mobility Eligiblity form.   Follow Up Plan:   Telephone follow up appointment date/time:  06/16/2024 at 3pm   Laymon Doll, [place] /VBCI - [facility] Social Worker [phone]

## 2024-06-04 ENCOUNTER — Ambulatory Visit (HOSPITAL_COMMUNITY): Admission: RE | Admit: 2024-06-04

## 2024-06-04 ENCOUNTER — Ambulatory Visit (HOSPITAL_COMMUNITY)

## 2024-06-10 ENCOUNTER — Other Ambulatory Visit: Admitting: *Deleted

## 2024-06-11 ENCOUNTER — Ambulatory Visit (HOSPITAL_COMMUNITY)

## 2024-06-11 NOTE — Patient Instructions (Signed)
 Montie Skates - I am sorry I was unable to reach you today for our scheduled appointment. I work with Theophilus Pagan, MD and am calling to support your healthcare needs. Please contact me at [phone] at your earliest convenience. I look forward to speaking with you soon.   Thank you,  Bhavik Cabiness, RN, BSN, ACM RN Care Manager [employer] [phone]

## 2024-06-15 ENCOUNTER — Telehealth: Payer: Self-pay

## 2024-06-16 ENCOUNTER — Telehealth

## 2024-06-24 ENCOUNTER — Ambulatory Visit (HOSPITAL_COMMUNITY): Admission: RE | Admit: 2024-06-24 | Source: Ambulatory Visit

## 2024-06-24 ENCOUNTER — Ambulatory Visit (HOSPITAL_COMMUNITY)
Admission: RE | Admit: 2024-06-24 | Discharge: 2024-06-24 | Disposition: A | Source: Ambulatory Visit | Attending: Orthopaedic Surgery | Admitting: Orthopaedic Surgery

## 2024-06-24 DIAGNOSIS — Z96651 Presence of right artificial knee joint: Secondary | ICD-10-CM | POA: Insufficient documentation

## 2024-06-24 MED ORDER — TECHNETIUM TC 99M MEDRONATE IV KIT
20.0000 | PACK | Freq: Once | INTRAVENOUS | Status: AC | PRN
  Administered 2024-06-24: 21.2 via INTRAVENOUS

## 2024-06-29 ENCOUNTER — Telehealth: Payer: Self-pay | Admitting: Orthopaedic Surgery

## 2024-06-29 NOTE — Telephone Encounter (Signed)
Still awaiting results

## 2024-06-29 NOTE — Telephone Encounter (Signed)
 Pt request a call with results of bone scan

## 2024-07-01 NOTE — Progress Notes (Signed)
 Complex Care Management Care Guide Note  07/01/2024 Name: Monica Watson MRN: [id number] DOB: [birth year]  Monica Watson is a 69 y.o. year old female who is a primary care patient of Theophilus Pagan, MD and is actively engaged with the care management team. I reached out to Montie Skates by phone today to assist with re-scheduling  with the RN Case Manager.  Follow up plan: Unsuccessful telephone outreach attempt made. A HIPAA compliant phone message was left for the patient providing contact information and requesting a return call.  Leotis Rase [facility], [facility] Guide  Direct Dial: [phone]  Fax [phone]

## 2024-07-02 NOTE — Telephone Encounter (Signed)
 Called patient

## 2024-07-03 NOTE — Progress Notes (Signed)
 Complex Care Management Care Guide Note  07/03/2024 Name: Monica Watson MRN: [id number] DOB: 1955-08-27  Monica Watson is a 69 y.o. year old female who is a primary care patient of Theophilus Pagan, MD and is actively engaged with the care management team. I reached out to Monica Watson by phone today to assist with re-scheduling  with the RN Case Manager.  Follow up plan: Unsuccessful telephone outreach attempt made. A HIPAA compliant phone message was left for the patient providing contact information and requesting a return call.  Monica Watson [facility], [facility] Guide  Direct Dial: [phone]  Fax [phone]

## 2024-07-06 ENCOUNTER — Other Ambulatory Visit: Payer: Self-pay

## 2024-07-06 ENCOUNTER — Ambulatory Visit: Payer: Self-pay | Admitting: Family Medicine

## 2024-07-06 ENCOUNTER — Encounter: Payer: Self-pay | Admitting: Family Medicine

## 2024-07-06 VITALS — BP 108/74 | HR 90 | Wt 187.0 lb

## 2024-07-06 DIAGNOSIS — Z23 Encounter for immunization: Secondary | ICD-10-CM

## 2024-07-06 DIAGNOSIS — M79604 Pain in right leg: Secondary | ICD-10-CM

## 2024-07-06 DIAGNOSIS — M79641 Pain in right hand: Secondary | ICD-10-CM | POA: Diagnosis not present

## 2024-07-06 DIAGNOSIS — E1169 Type 2 diabetes mellitus with other specified complication: Secondary | ICD-10-CM | POA: Diagnosis not present

## 2024-07-06 DIAGNOSIS — Z1231 Encounter for screening mammogram for malignant neoplasm of breast: Secondary | ICD-10-CM

## 2024-07-06 LAB — POCT GLYCOSYLATED HEMOGLOBIN (HGB A1C): HbA1c, POC (controlled diabetic range): 6.5 % (ref 0.0–7.0)

## 2024-07-06 MED ORDER — SHINGRIX 50 MCG/0.5ML IM SUSR: INTRAMUSCULAR | 1 refills | Status: AC

## 2024-07-06 NOTE — Patient Instructions (Signed)
 Visit Information  Thank you for taking time to visit with me today. Please don't hesitate to contact me if I can be of assistance to you before our next scheduled appointment.  Your next care management appointment is no further scheduled appointments.   Patient declined further f/u calls. At this time.  Please call the care guide team at [phone] if you need to cancel, schedule, or reschedule an appointment.   Please call the Suicide and Crisis Lifeline: 988 go to [facility] Urgent [facility] [address] [place] [phone]) call 911 if you are experiencing a Mental Health or Behavioral Health Crisis or need someone to talk to.  Laymon Doll, BSW [place]/VBCI - [employer] Social Worker [phone]

## 2024-07-06 NOTE — Patient Instructions (Addendum)
 It was wonderful to see you today! Thank you for choosing [facility].   Please bring ALL of your medications with you to every visit.   Today we talked about:  Your A1c is 6.5, this is well-controlled!  It is only slightly higher than last time we checked so please continue to take your metformin  1000 mg daily.  We can repeat it again in 3 to 6 months and if it continues to increase we may consider increasing your metformin . I ordered your mammogram to be done at the breast center as it has been 2-1/2 years since your last scan.  Please call them with information below and get it set up. Please see the list of resources below for dentist and other community resources including housing.  Please also keep your social work appointment on the phone this afternoon as they can help you with more specific details. For your leg you likely have varicose veins, the best treatment for this is using compression and elevation.  If they become very elevated and impact your ability to walk or move we consider sending you to vascular for procedure to help with it. For your right thumb pain I think a brace that you wear day and night can help but especially if it is the beginning of carpal tunnel.  I recommend that you get a brace that locks in your thumb that you can order online for cheaper than we can give you here.  You can also follow-up with your orthopedic doctor to discuss further.  Please follow up in 3 months   Call the clinic at [phone] if your symptoms worsen or you have any concerns.  Please be sure to schedule follow up at the front desk before you leave today.   Izetta Nap, DO Family Medicine    Advocacy/Legal Legal [facility] [place]:  [phone]  /  [phone] /  LVM, taking clients  [facility]:  [phone] /  Onsite, counseling with [place] is virtual, Accepting new clients   Family Service of the [employer] 24-hr Crisis line:  [phone] Virtual & Onsite  services (Client preference), Accepting New clients  [employer], [place]:  [phone] Virtual & Onsite services (Client preference), Accepting New clients  [employer] (custody):  [phone] Virtual, Accepting new clients  [employer] [facility]:   [phone] Virtual/Telephone, accepting clients for waitlisting (time depends on services)   Financial Assistance [facility]:  [phone] Virtual (financial assistance) & Onsite (all other services), Accepting new families  [employer]: (848)071-3640 [employer] (furniture):  231-809-3173   [facility] Helping Hands: 769-205-5132   Low Income Energy Assistance: 952-670-6041 Virtual, accepting new families    Food Assistance DHHS- SNAP/ Food Stamps: (504)029-2026 Virtual  WIC: [place](402)620-0977 ;  HP 2157162707 Virtual        During the summer, text FOOD to 122122     General Health / Clinics (Adults) Orange Card (for Adults) through [facility]: [phone]    [facility]:   [phone]   [facility]:   [phone]   Health Department:  [phone]   [facility]:  [phone] / [phone]   [facility] [place]:   [phone] Onsite, Accepting new patients  [facility]:   [phone] Onsite , Accepting new patients    Housing [place] [facility]:   8706595328   [facility] Housing Authority:  571-792-6483   Affordable Housing Managemnt:  [phone]     Tutoring/ Mentoring [employer]: [phone] No tutoring only afterschool programming (In Person), Accepting new students  [employer] Big Sisters: (317) 557-1824 [place])  [phone] (HP)   ACES through child's school: [phone])[phone]   [employer]: contact your local Y In Person, Accepting New students  [employer] Mentor Program: [phone] Will re-launch in the fall   Updated 09/2019     Dental list         Updated 11.20.18 These dentists all accept Medicaid.  The list is a courtesy and for your convenience. Estos dentistas aceptan Medicaid.  La lista es para su conveniencia y es una cortesa.     [facility]     [phone] [address]  [address] [place] [address] Se habla espaol From 77 to 77 years old Parent may go with child only for cleaning Dorise Rouleau DDS     [phone] Clancy Hammersmith, DDS (Spanish speaking) [address] [place]  [address] Se habla espaol From 35 to 73 years old Parent may go with child   Nikki armin Nikki DMD    [phone] [address] [place] [address] Se habla espaol Vietnamese spoken From 39 years old Parent may go with child [facility]     [phone] [address] [place] [place] [address] Se habla espaol From 50 to 86 years old Parent may NOT go with child  Deleta Norcross DDS  [phone] [facility]      [address]  [place] [address] Se habla espaol Vietnamese spoken (preferred to bring translator) From teeth coming in to 75 years old Parent may go with child  [facility]     [phone] [address] [place]. [place] [address] Requires certification. Call for information. Requiere certificacin. Llame para informacin. Algunos dias se habla espaol  From birth to 20 years Parent possibly goes with child   Elza Hamburger DDS     [phone] [address] [place] [address] Se habla espaol From 18 months to 18 years  Parent may go with child  [facility] DDS     Camellia DOROTHA Cagey DDS  [phone] [address] [place] [address] Se habla espaol From 49 year old Parent may go with child   Abran Kenner DDS    [phone] [address] [place] [address] Se habla espaol  From 18 months to 40 years old Parent may go with child DOROTHA Prince Fell DDS     [phone] [address] [place] [address] Se habla espaol From 69 to 14 years old Parent may go with child  [facility]    [phone] [address] [place] [address] No se breck conte From birth [facility]  [phone] [address] [place] [address] Se habla espanol Interpretation for other languages Special needs children welcome  [place] Hamilton, DDS PA     [phone]  [place], [place] [address] From 68 years old   Special needs children welcome  [facility]   [phone] Dr. Sona Isharani [address] [place], [place] [address] Se habla espaol From birth to 12 years Special needs children welcome   [facility] [place] [phone] [address] [place], [place] [address]   [facility] [place] [phone] [address] [place], [place] [address]

## 2024-07-06 NOTE — Progress Notes (Signed)
" ° ° °  SUBJECTIVE:   CHIEF COMPLAINT / HPI:   Discussed the use of AI scribe software for clinical note transcription with the patient, who gave verbal consent to proceed.  Right leg pain - Leg pain for several months, localized from the knee down - Prior bone scan did not reveal significant findings - Compression sleeves provide some relief  Right hand pain - Arthritis in thumb - History of carpal tunnel surgery on left hand - Numbness and tingling, especially when using phone  Diabetes Current Regimen: Metformin  1000 mg daily CBGs: Not checking Last A1c:  Lab Results  Component Value Date   HGBA1C 6.5 07/06/2024    Denies polyuria, polydipsia, hypoglycemia. Last Eye Exam: UTD Statin: Atorvastatin  40 mg daily ACE/ARB: Losartan  50 mg daily  PERTINENT  PMH / PSH: T2DM, chronic pain  OBJECTIVE:   BP 108/74   Pulse 90   Wt 187 lb (84.8 kg)   SpO2 97%   BMI 27.62 kg/m    General: Alert, no apparent distress, well groomed HEENT: Normocephalic, atraumatic, moist mucus membranes, neck supple Respiratory: Normal respiratory effort GI: Non-distended Skin: No rashes, no jaundice Psych: Appropriate mood and affect MSK: Right leg: - Mild fullness noted on lateral, anterior right shin with skin coloration changes notable for superficial vein - TTP over area of fullness - Full ROM of right leg, normal gait Right wrist/hand - No erythema, ecchymosis or deformity - TTP over right CMC with some radiation to thumb and first finger - Full ROM of right wrist and fingers without pain  ASSESSMENT/PLAN:   Assessment & Plan Type 2 diabetes mellitus with other specified complication, without long-term current use of insulin  (HCC) A1c 6.5, at goal.  Continue on metformin  1000 mg daily. Encounter for screening mammogram for malignant neoplasm of breast Due for mammogram, ordered. Encounter for immunization Provided with Rx for shingles vaccine Right leg pain Exam most consistent  with superficial varicose veins, per patient report evaluation by orthopedics otherwise unremarkable.  Advised compression, elevation and supportive care and if not improved will consider referral to [facility] for vein treatment. Right hand pain Most consistent with right thumb OA and possible mild carpal tunnel syndrome.  Recommend wearing wrist/hand brace day and night, advised on ordering online. -Follow-up in 4 weeks if not improved after utilization of wrist brace    Dr. Izetta Nap, DO [facility]     "

## 2024-07-06 NOTE — Patient Outreach (Signed)
 Social Drivers of Health  Community Resource and Care Coordination Visit Note   07/06/2024  Name: Monica Watson MRN: [id number] DOB:1956/02/21  Situation: Referral received for [facility] needs assessment and assistance related to [employer]. I obtained verbal consent from Patient.  Visit completed with Patient on the phone.   Background:   SDOH Interventions Today    Flowsheet Row Most Recent Value  SDOH Interventions   Food Insecurity Interventions Intervention Not Indicated  Housing Interventions Intervention Not Indicated  Transportation Interventions Payor Benefit  Utilities Interventions Intervention Not Indicated     Assessment:   Goals Addressed             This Visit's Progress    BSW Goal       Current SDOH Barriers:  Transportation Housing  Interventions: Patient interviewed and appropriate screenings performed Referred patient to community resources  Discussed plans with patient for ongoing follow up and provided patient with direct contact number Advised patient to continue working with [facility] re housing.  Advised pt to call [facility] associates and [facility] to set up appointments and confirm they accept her health insurance.  BSW will message PCP MD re patients request for a prescription to help with her cough.   06/02/2024 Patient is looking for resources to help with rental deposit. BSW completed Transportation and Mobility Eligibility form for patient for transportation services through DSS. Patient was provided with phone to f/u.  07/06/2024 Pt is unsure if medicare is still active. Pt was provided with phone number to call and confirm enrollment.         Recommendation:   attend all scheduled provider appointments call for transportation assistance at least one week before appointments ask for help if you don't understand your health insurance benefits  Follow Up Plan:   Patient declines further calls or assistance.  [employer] will be closed. Patient has been provided contact information should new needs arise.   Laymon Doll, BSW [place] [employer] Social Worker [phone]

## 2024-07-10 ENCOUNTER — Other Ambulatory Visit: Payer: Self-pay | Admitting: Family Medicine

## 2024-07-10 DIAGNOSIS — R0981 Nasal congestion: Secondary | ICD-10-CM

## 2024-08-06 ENCOUNTER — Ambulatory Visit
